# Patient Record
Sex: Male | Born: 1940
Health system: Southern US, Community
[De-identification: ages and names within clinical notes are randomized; demographics above are authoritative.]

## PROBLEM LIST (undated history)

## (undated) DIAGNOSIS — K219 Gastro-esophageal reflux disease without esophagitis: Secondary | ICD-10-CM

## (undated) DIAGNOSIS — E7800 Pure hypercholesterolemia, unspecified: Secondary | ICD-10-CM

## (undated) DIAGNOSIS — J449 Chronic obstructive pulmonary disease, unspecified: Secondary | ICD-10-CM

## (undated) DIAGNOSIS — Z720 Tobacco use: Secondary | ICD-10-CM

## (undated) DIAGNOSIS — I10 Essential (primary) hypertension: Secondary | ICD-10-CM

## (undated) DIAGNOSIS — H3530 Unspecified macular degeneration: Secondary | ICD-10-CM

## (undated) DIAGNOSIS — D649 Anemia, unspecified: Secondary | ICD-10-CM

## (undated) DIAGNOSIS — I2510 Atherosclerotic heart disease of native coronary artery without angina pectoris: Secondary | ICD-10-CM

## (undated) DIAGNOSIS — S88919A Complete traumatic amputation of unspecified lower leg, level unspecified, initial encounter: Secondary | ICD-10-CM

## (undated) DIAGNOSIS — N400 Enlarged prostate without lower urinary tract symptoms: Secondary | ICD-10-CM

## (undated) DIAGNOSIS — C4491 Basal cell carcinoma of skin, unspecified: Secondary | ICD-10-CM

## (undated) DIAGNOSIS — I639 Cerebral infarction, unspecified: Secondary | ICD-10-CM

## (undated) HISTORY — DX: Atherosclerotic heart disease of native coronary artery without angina pectoris: I25.10

## (undated) HISTORY — DX: Anemia, unspecified: D64.9

## (undated) HISTORY — DX: Complete traumatic amputation of unspecified lower leg, level unspecified, initial encounter: S88.919A

## (undated) HISTORY — DX: Gastro-esophageal reflux disease without esophagitis: K21.9

## (undated) HISTORY — DX: Benign prostatic hyperplasia without lower urinary tract symptoms: N40.0

## (undated) HISTORY — DX: Essential (primary) hypertension: I10

## (undated) HISTORY — PX: APPENDECTOMY: SHX54

## (undated) HISTORY — DX: Chronic obstructive pulmonary disease, unspecified: J44.9

## (undated) HISTORY — DX: Unspecified macular degeneration: H35.30

## (undated) HISTORY — DX: Tobacco use: Z72.0

## (undated) HISTORY — PX: CARDIAC CATHETERIZATION: SHX172

## (undated) HISTORY — DX: Basal cell carcinoma of skin, unspecified: C44.91

## (undated) HISTORY — DX: Pure hypercholesterolemia, unspecified: E78.00

---

## 2006-07-21 HISTORY — PX: CORONARY ANGIOPLASTY WITH STENT PLACEMENT: SHX49

## 2006-09-28 HISTORY — PX: CORONARY ANGIOPLASTY WITH STENT PLACEMENT: SHX49

## 2011-08-26 DIAGNOSIS — S88919A Complete traumatic amputation of unspecified lower leg, level unspecified, initial encounter: Secondary | ICD-10-CM

## 2011-08-26 HISTORY — DX: Complete traumatic amputation of unspecified lower leg, level unspecified, initial encounter: S88.919A

## 2011-08-26 HISTORY — PX: AMPUTATION: SHX166

## 2011-09-05 ENCOUNTER — Ambulatory Visit: Payer: Self-pay | Admitting: Family Medicine

## 2011-09-05 LAB — CBC WITH DIFFERENTIAL/PLATELET
Basophil %: 0.7 %
Eosinophil #: 0.4 10*3/uL (ref 0.0–0.7)
Eosinophil %: 5.4 %
Lymphocyte #: 1.1 10*3/uL (ref 1.0–3.6)
Monocyte %: 6.8 %
Neutrophil #: 4.5 10*3/uL (ref 1.4–6.5)
Neutrophil %: 70.4 %
Platelet: 455 10*3/uL — ABNORMAL HIGH (ref 150–440)

## 2013-06-01 ENCOUNTER — Other Ambulatory Visit: Payer: Self-pay

## 2013-06-01 ENCOUNTER — Ambulatory Visit: Payer: Self-pay | Admitting: Cardiovascular Disease

## 2013-06-01 ENCOUNTER — Ambulatory Visit (INDEPENDENT_AMBULATORY_CARE_PROVIDER_SITE_OTHER): Payer: Commercial Managed Care - HMO | Admitting: Cardiovascular Disease

## 2013-06-01 ENCOUNTER — Encounter: Payer: Self-pay | Admitting: Cardiovascular Disease

## 2013-06-01 VITALS — BP 142/80 | HR 64 | Ht 69.0 in | Wt 154.5 lb

## 2013-06-01 DIAGNOSIS — Z955 Presence of coronary angioplasty implant and graft: Secondary | ICD-10-CM | POA: Insufficient documentation

## 2013-06-01 DIAGNOSIS — E1165 Type 2 diabetes mellitus with hyperglycemia: Secondary | ICD-10-CM

## 2013-06-01 DIAGNOSIS — R0602 Shortness of breath: Secondary | ICD-10-CM | POA: Diagnosis not present

## 2013-06-01 DIAGNOSIS — I2510 Atherosclerotic heart disease of native coronary artery without angina pectoris: Secondary | ICD-10-CM | POA: Diagnosis not present

## 2013-06-01 DIAGNOSIS — E119 Type 2 diabetes mellitus without complications: Secondary | ICD-10-CM

## 2013-06-01 DIAGNOSIS — I209 Angina pectoris, unspecified: Secondary | ICD-10-CM

## 2013-06-01 DIAGNOSIS — Z01818 Encounter for other preprocedural examination: Secondary | ICD-10-CM

## 2013-06-01 DIAGNOSIS — R079 Chest pain, unspecified: Secondary | ICD-10-CM

## 2013-06-01 DIAGNOSIS — S88919A Complete traumatic amputation of unspecified lower leg, level unspecified, initial encounter: Secondary | ICD-10-CM

## 2013-06-01 DIAGNOSIS — I25118 Atherosclerotic heart disease of native coronary artery with other forms of angina pectoris: Secondary | ICD-10-CM | POA: Insufficient documentation

## 2013-06-01 DIAGNOSIS — E1151 Type 2 diabetes mellitus with diabetic peripheral angiopathy without gangrene: Secondary | ICD-10-CM | POA: Insufficient documentation

## 2013-06-01 DIAGNOSIS — IMO0002 Reserved for concepts with insufficient information to code with codable children: Secondary | ICD-10-CM | POA: Insufficient documentation

## 2013-06-01 DIAGNOSIS — Z9861 Coronary angioplasty status: Secondary | ICD-10-CM

## 2013-06-01 DIAGNOSIS — F17200 Nicotine dependence, unspecified, uncomplicated: Secondary | ICD-10-CM

## 2013-06-01 LAB — COMPREHENSIVE METABOLIC PANEL
ALBUMIN: 4 g/dL (ref 3.4–5.0)
ALK PHOS: 97 U/L
Anion Gap: 3 — ABNORMAL LOW (ref 7–16)
BUN: 13 mg/dL (ref 7–18)
Bilirubin,Total: 0.5 mg/dL (ref 0.2–1.0)
CALCIUM: 9.5 mg/dL (ref 8.5–10.1)
CREATININE: 1.26 mg/dL (ref 0.60–1.30)
Chloride: 100 mmol/L (ref 98–107)
Co2: 35 mmol/L — ABNORMAL HIGH (ref 21–32)
EGFR (Non-African Amer.): 57 — ABNORMAL LOW
Glucose: 207 mg/dL — ABNORMAL HIGH (ref 65–99)
Osmolality: 282 (ref 275–301)
POTASSIUM: 3.8 mmol/L (ref 3.5–5.1)
SGOT(AST): 23 U/L (ref 15–37)
SGPT (ALT): 17 U/L (ref 12–78)
Sodium: 138 mmol/L (ref 136–145)
Total Protein: 7.9 g/dL (ref 6.4–8.2)

## 2013-06-01 LAB — CBC WITH DIFFERENTIAL/PLATELET
Basophil #: 0 10*3/uL (ref 0.0–0.1)
Basophil %: 0.6 %
Eosinophil #: 0.3 10*3/uL (ref 0.0–0.7)
Eosinophil %: 3.9 %
HCT: 44.4 % (ref 40.0–52.0)
HGB: 15.2 g/dL (ref 13.0–18.0)
LYMPHS PCT: 18.5 %
Lymphocyte #: 1.4 10*3/uL (ref 1.0–3.6)
MCH: 31.4 pg (ref 26.0–34.0)
MCHC: 34.1 g/dL (ref 32.0–36.0)
MCV: 92 fL (ref 80–100)
Monocyte #: 0.6 x10 3/mm (ref 0.2–1.0)
Monocyte %: 7.7 %
NEUTROS PCT: 69.3 %
Neutrophil #: 5.4 10*3/uL (ref 1.4–6.5)
Platelet: 208 10*3/uL (ref 150–440)
RBC: 4.83 10*6/uL (ref 4.40–5.90)
RDW: 14 % (ref 11.5–14.5)
WBC: 7.8 10*3/uL (ref 3.8–10.6)

## 2013-06-01 LAB — LIPID PANEL
Cholesterol: 175 mg/dL (ref 0–200)
HDL Cholesterol: 39 mg/dL — ABNORMAL LOW (ref 40–60)
LDL CHOLESTEROL, CALC: 94 mg/dL (ref 0–100)
TRIGLYCERIDES: 211 mg/dL — AB (ref 0–200)
VLDL Cholesterol, Calc: 42 mg/dL — ABNORMAL HIGH (ref 5–40)

## 2013-06-01 LAB — PROTIME-INR
INR: 0.9
PROTHROMBIN TIME: 12.3 s (ref 11.5–14.7)

## 2013-06-01 MED ORDER — CLOPIDOGREL BISULFATE 75 MG PO TABS: 75.0000 mg | ORAL_TABLET | Freq: Every day | ORAL | Status: DC

## 2013-06-01 MED ORDER — ISOSORBIDE MONONITRATE ER 30 MG PO TB24: 30.0000 mg | ORAL_TABLET | Freq: Every day | ORAL | Status: DC

## 2013-06-01 NOTE — Assessment & Plan Note (Signed)
Prior stenting to his RCA, LAD and left circumflex in 2008

## 2013-06-01 NOTE — Patient Instructions (Addendum)
You are doing well. Please start isosorbide one a day for anginal pain Please start plavix 73m once daily  We will schedule you for a cardiac cath on Monday March 23rd  Please call [place] you have new issues that need to be addressed before your next appt.  Your physician wants you to follow-up in: 1 month.    [facility]   You are scheduled for a Cardiac Cath on:_____Monday, March 23____________  Please arrive at 9:30am on the day of your procedure  You will need to pre-register prior to the day of your procedure.  [facility]  Registration is the first desk on your right.  Please take the procedure order we have given you in order to be registered appropriately  Do not eat/drink anything after midnight  Someone will need to drive you home  It is recommended someone be with you for the first 24 hours after your procedure  Wear clothes that are easy to get on/off and wear slip on shoes if possible   Medications bring a current list of all medications with you  _X__ You may take all of your medications the morning of your procedure with enough water to swallow safely   Day of your procedure: [place].  Free valet service is available.  [place] at the registration desk (1st desk on your right) to receive your armband. After receiving your armband someone will escort you to the cardiac cath/special procedures waiting area.  The usual length of stay after your procedure is about 2 to 3 hours.  This can vary.  If you have any questions, please call our office at [phone] or you may [facility] at [phone]

## 2013-06-01 NOTE — Assessment & Plan Note (Signed)
Trauma from [place] 2 years ago

## 2013-06-01 NOTE — Assessment & Plan Note (Signed)
Notes indicate he has diet-controlled diabetes

## 2013-06-01 NOTE — Assessment & Plan Note (Signed)
We have encouraged him to continue to work on weaning his cigarettes and smoking cessation. He will continue to work on this and does not want any assistance with chantix.  

## 2013-06-01 NOTE — Progress Notes (Signed)
Patient ID: Stanley Mclean, male    DOB: [birth year], 73 y.o.   MRN: [id number]  HPI Comments: [name] is a pleasant 73 year old gentleman with a long smoking history, history of coronary artery disease, stent to his mid LAD, 2 stents to his proximal left circumflex in May 2008, stent to his proximal RCA July 2008, previous anginal symptoms with epigastric discomfort with exertion who presents by referral from Dr. Luan Pulling for symptoms of epigastric discomfort with exertion over the past 2 weeks or so.   He reports that symptoms are relatively similar to prior anginal symptoms before stent placement in 2008. He continues to smoke at least one pack per day. He has history of amputation on the right, walks only 100 feet or so before having to stop. Now cannot walk 50 feet before he gets symptoms. Initially he felt his symptoms were from heartburn and he is started on omeprazole. Ulcer receive nitroglycerin for symptoms. He has not had to take these yet. Burning in his abdomen up into his chest has presented during the daytime as well as nighttime  History of amputation from lawnmower accident 2 years ago He is on lyrica for neuropathy.  EKG shows normal sinus rhythm with rate 64 beats per minute with ST abnormality in the inferior leads consistent with inferior ischemia Prior EKG dated 05/31/2013 is the same as today   Outpatient Encounter Prescriptions as of 06/01/2013  Medication Sig  . albuterol (PROVENTIL HFA;VENTOLIN HFA) 108 (90 BASE) MCG/ACT inhaler Inhale 2 puffs into the lungs every 6 (six) hours as needed for wheezing or shortness of breath.  Marland Kitchen aspirin 81 MG tablet Take 81 mg by mouth daily.  . clopidogrel (PLAVIX) 75 MG tablet Take 1 tablet (75 mg total) by mouth daily.  Marland Kitchen ipratropium (ATROVENT) 0.03 % nasal spray Place 2 sprays into both nostrils every 12 (twelve) hours.  . isosorbide mononitrate (IMDUR) 30 MG 24 hr tablet Take 1 tablet (30 mg total) by mouth daily.  Marland Kitchen  lisinopril-hydrochlorothiazide (PRINZIDE,ZESTORETIC) 20-12.5 MG per tablet Take 1 tablet by mouth daily.  . metoprolol (LOPRESSOR) 100 MG tablet Take 100 mg by mouth 2 (two) times daily.  . nitroGLYCERIN (NITROSTAT) 0.4 MG SL tablet Place 0.4 mg under the tongue every 5 (five) minutes as needed for chest pain.  Marland Kitchen omeprazole (PRILOSEC) 20 MG capsule Take 20 mg by mouth daily.  . simvastatin (ZOCOR) 10 MG tablet Take 10 mg by mouth daily.  Marland Kitchen terazosin (HYTRIN) 1 MG capsule Take 1 mg by mouth once.    Review of Systems  Constitutional: Negative.   HENT: Negative.   Eyes: Negative.   Respiratory: Positive for chest tightness and shortness of breath.   Cardiovascular: Positive for chest pain.  Gastrointestinal: Negative.   Endocrine: Negative.   Musculoskeletal: Positive for gait problem.  Skin: Negative.   Allergic/Immunologic: Negative.   Neurological: Negative.   Hematological: Negative.   Psychiatric/Behavioral: Negative.   All other systems reviewed and are negative.    BP 142/80  Pulse 64  Ht _0  (1.753 m)  Wt 154 lb 8 oz (70.081 kg)  BMI 22.81 kg/m2  Physical Exam  Nursing note and vitals reviewed. Constitutional: He is oriented to person, place, and time. He appears well-developed and well-nourished.  HENT:  Head: Normocephalic.  Nose: Nose normal.  Mouth/Throat: Oropharynx is clear and moist.  Eyes: Conjunctivae are normal. Pupils are equal, round, and reactive to light.  Neck: Normal range of motion. Neck supple. No JVD present.  Cardiovascular: Normal rate, regular rhythm, S1 normal, S2 normal, normal heart sounds and intact distal pulses.  Exam reveals no gallop and no friction rub.   No murmur heard. Pulmonary/Chest: Effort normal and breath sounds normal. No respiratory distress. He has no wheezes. He has no rales. He exhibits no tenderness.  Abdominal: Soft. Bowel sounds are normal. He exhibits no distension. There is no tenderness.  Musculoskeletal: Normal  range of motion. He exhibits no edema and no tenderness.  Lymphadenopathy:    He has no cervical adenopathy.  Neurological: He is alert and oriented to person, place, and time. Coordination normal.  Skin: Skin is warm and dry. No rash noted. No erythema.  Psychiatric: He has a normal mood and affect. His behavior is normal. Judgment and thought content normal.      Assessment and Plan

## 2013-06-01 NOTE — Assessment & Plan Note (Addendum)
Current symptoms very concerning for worsening coronary artery disease. Symptoms similar to prior angina before stenting. After long discussion, we will schedule him for cardiac catheterization in the next several days. We have suggested he start isosorbide mononitrate 30 mg daily. Will also start Plavix 75 mg daily

## 2013-06-02 ENCOUNTER — Ambulatory Visit: Payer: Self-pay | Admitting: Family Medicine

## 2013-06-06 ENCOUNTER — Encounter: Payer: Self-pay | Admitting: Cardiovascular Disease

## 2013-06-06 ENCOUNTER — Ambulatory Visit: Payer: Self-pay | Admitting: Cardiovascular Disease

## 2013-06-06 HISTORY — PX: CORONARY ANGIOPLASTY WITH STENT PLACEMENT: SHX49

## 2013-06-06 LAB — CK TOTAL AND CKMB (NOT AT ARMC)
CK, Total: 68 U/L
CK-MB: 1.4 ng/mL (ref 0.5–3.6)

## 2013-06-06 LAB — HEMATOCRIT: HCT: 39.5 % — AB (ref 40.0–52.0)

## 2013-06-06 LAB — PLATELET COUNT: Platelet: 165 10*3/uL (ref 150–440)

## 2013-06-07 ENCOUNTER — Telehealth: Payer: Self-pay

## 2013-06-07 ENCOUNTER — Other Ambulatory Visit: Payer: Self-pay | Admitting: Physician Assistant

## 2013-06-07 ENCOUNTER — Encounter: Payer: Self-pay | Admitting: Physician Assistant

## 2013-06-07 DIAGNOSIS — I209 Angina pectoris, unspecified: Secondary | ICD-10-CM

## 2013-06-07 LAB — BASIC METABOLIC PANEL
Anion Gap: 7 (ref 7–16)
BUN: 13 mg/dL (ref 7–18)
CHLORIDE: 106 mmol/L (ref 98–107)
CO2: 28 mmol/L (ref 21–32)
Calcium, Total: 8.5 mg/dL (ref 8.5–10.1)
Creatinine: 1.06 mg/dL (ref 0.60–1.30)
EGFR (African American): >60
EGFR (Non-African Amer.): >60
Glucose: 97 mg/dL (ref 65–99)
Osmolality: 281 (ref 275–301)
Potassium: 3.1 mmol/L — ABNORMAL LOW (ref 3.5–5.1)
Sodium: 141 mmol/L (ref 136–145)

## 2013-06-07 MED ORDER — METOPROLOL TARTRATE 50 MG PO TABS: 50.0000 mg | ORAL_TABLET | Freq: Two times a day (BID) | ORAL | Status: DC

## 2013-06-07 MED ORDER — CLOPIDOGREL BISULFATE 75 MG PO TABS: 75.0000 mg | ORAL_TABLET | Freq: Every day | ORAL | Status: DC

## 2013-06-07 MED ORDER — ASPIRIN EC 325 MG PO TBEC: 325.0000 mg | DELAYED_RELEASE_TABLET | Freq: Every day | ORAL | Status: DC

## 2013-06-07 MED ORDER — LISINOPRIL 20 MG PO TABS: 20.0000 mg | ORAL_TABLET | Freq: Every day | ORAL | Status: DC

## 2013-06-07 MED ORDER — FAMOTIDINE 40 MG PO TABS: 40.0000 mg | ORAL_TABLET | Freq: Every day | ORAL | Status: DC

## 2013-06-07 NOTE — Telephone Encounter (Signed)
Patient contacted regarding discharge from [facility] on 06/07/13.  Patient understands to follow up with Dr. Rockey Situ on 06/22/13 at 11:30 at [facility]. Patient understands discharge instructions? yes Patient understands medications and regiment? yes Patient understands to bring all medications to this visit? yes

## 2013-06-08 ENCOUNTER — Other Ambulatory Visit: Payer: Self-pay

## 2013-06-08 DIAGNOSIS — R079 Chest pain, unspecified: Secondary | ICD-10-CM

## 2013-06-08 DIAGNOSIS — R0602 Shortness of breath: Secondary | ICD-10-CM

## 2013-06-08 DIAGNOSIS — I2510 Atherosclerotic heart disease of native coronary artery without angina pectoris: Secondary | ICD-10-CM

## 2013-06-08 DIAGNOSIS — Z01818 Encounter for other preprocedural examination: Secondary | ICD-10-CM

## 2013-06-22 ENCOUNTER — Encounter: Payer: Self-pay | Admitting: Cardiovascular Disease

## 2013-06-22 ENCOUNTER — Ambulatory Visit (INDEPENDENT_AMBULATORY_CARE_PROVIDER_SITE_OTHER): Payer: Commercial Managed Care - HMO | Admitting: Cardiovascular Disease

## 2013-06-22 VITALS — BP 102/62 | HR 72 | Ht 69.0 in | Wt 152.8 lb

## 2013-06-22 DIAGNOSIS — Z955 Presence of coronary angioplasty implant and graft: Secondary | ICD-10-CM

## 2013-06-22 DIAGNOSIS — I2510 Atherosclerotic heart disease of native coronary artery without angina pectoris: Secondary | ICD-10-CM

## 2013-06-22 DIAGNOSIS — I10 Essential (primary) hypertension: Secondary | ICD-10-CM | POA: Insufficient documentation

## 2013-06-22 DIAGNOSIS — Z9861 Coronary angioplasty status: Secondary | ICD-10-CM

## 2013-06-22 DIAGNOSIS — E119 Type 2 diabetes mellitus without complications: Secondary | ICD-10-CM

## 2013-06-22 DIAGNOSIS — F17200 Nicotine dependence, unspecified, uncomplicated: Secondary | ICD-10-CM

## 2013-06-22 MED ORDER — LISINOPRIL 40 MG PO TABS: 40.0000 mg | ORAL_TABLET | Freq: Every day | ORAL | Status: DC

## 2013-06-22 NOTE — Assessment & Plan Note (Signed)
Reason stent placed to his distal RCA with improvement of his anginal symptoms. Continue aspirin and Plavix. We'll stop the isosorbide given his headaches. Suggested he retry lisinopril 20 mg titrating up to 40 mg if tolerated. He'll monitor his blood pressure at home.

## 2013-06-22 NOTE — Assessment & Plan Note (Signed)
We have encouraged continued exercise, careful diet management in an effort to lose weight. 

## 2013-06-22 NOTE — Assessment & Plan Note (Signed)
DES placed to his distal RCA. Patent stent in the LAD and circumflex.  Recommended smoking cessation

## 2013-06-22 NOTE — Progress Notes (Signed)
Patient ID: Stanley Mclean, male    DOB: January 04, 1941, 73 y.o.   MRN: [id number]  HPI Comments: Stanley Mclean is a pleasant 73 year old gentleman with a long smoking history, history of coronary artery disease, stent to his mid LAD, 2 stents to his proximal left circumflex in May 2008, stent to his proximal RCA July 2008, previous anginal symptoms , patient of Dr. Luan Pulling recently presenting with  symptoms of epigastric discomfort with exertion .  He had cardiac catheterization 06/06/2013, found to have severe distal RCA disease, mild in-stent restenosis in the circumflex and LAD, normal ejection fraction. 40% mid LAD disease, 40% B1 disease, 30% proximal circumflex also noted. He had Xience stent 2.5 x 15 mm placed in the distal RCA. He was kept overnight and discharged in the morning.  Since his discharge, he denies having any further chest pain. Lisinopril was increased up to 40 mg daily by Dr. Luan Pulling. With this he started having headaches and decrease the dose back to 20 mg daily. He is continued on isosorbide, taking aspirin and Plavix. She needs to smoke  History of amputation from lawnmower accident 2 years ago He is on lyrica for neuropathy.  EKG shows normal sinus rhythm with rate 72 beats per minute with ST abnormality in the inferior leads consistent with inferior ischemia   Outpatient Encounter Prescriptions as of 06/22/2013  Medication Sig  . albuterol (PROVENTIL HFA;VENTOLIN HFA) 108 (90 BASE) MCG/ACT inhaler Inhale 2 puffs into the lungs every 6 (six) hours as needed for wheezing or shortness of breath.  Marland Kitchen aspirin EC 325 MG tablet Take 1 tablet (325 mg total) by mouth daily.  . clopidogrel (PLAVIX) 75 MG tablet Take 1 tablet (75 mg total) by mouth daily.  Marland Kitchen ipratropium (ATROVENT) 0.03 % nasal spray Place 2 sprays into both nostrils daily.   . metoprolol (LOPRESSOR) 50 MG tablet Take 1 tablet (50 mg total) by mouth 2 (two) times daily.  . nitroGLYCERIN (NITROSTAT) 0.4 MG SL tablet  Place 0.4 mg under the tongue every 5 (five) minutes as needed for chest pain.  . simvastatin (ZOCOR) 10 MG tablet Take 10 mg by mouth daily.  Marland Kitchen terazosin (HYTRIN) 1 MG capsule Take 1 mg by mouth once.  .  isosorbide mononitrate (IMDUR) 30 MG 24 hr tablet Take 1 tablet (30 mg total) by mouth daily.  Marland Kitchen  lisinopril (PRINIVIL,ZESTRIL) 20 MG tablet Take 1 tablet (20 mg total) by mouth daily.     Review of Systems  Constitutional: Negative.   HENT: Negative.   Eyes: Negative.   Gastrointestinal: Negative.   Endocrine: Negative.   Musculoskeletal: Positive for gait problem.  Skin: Negative.   Allergic/Immunologic: Negative.   Neurological: Negative.   Hematological: Negative.   Psychiatric/Behavioral: Negative.   All other systems reviewed and are negative.   BP 102/62  Pulse 72  Ht _0  (1.753 m)  Wt 152 lb 12 oz (69.287 kg)  BMI 22.55 kg/m2  Physical Exam  Nursing note and vitals reviewed. Constitutional: He is oriented to person, place, and time. He appears well-developed and well-nourished.  HENT:  Head: Normocephalic.  Nose: Nose normal.  Mouth/Throat: Oropharynx is clear and moist.  Eyes: Conjunctivae are normal. Pupils are equal, round, and reactive to light.  Neck: Normal range of motion. Neck supple. No JVD present.  Cardiovascular: Normal rate, regular rhythm, S1 normal, S2 normal, normal heart sounds and intact distal pulses.  Exam reveals no gallop and no friction rub.   No murmur heard.  Pulmonary/Chest: Effort normal and breath sounds normal. No respiratory distress. He has no wheezes. He has no rales. He exhibits no tenderness.  Abdominal: Soft. Bowel sounds are normal. He exhibits no distension. There is no tenderness.  Musculoskeletal: Normal range of motion. He exhibits no edema and no tenderness.  Lymphadenopathy:    He has no cervical adenopathy.  Neurological: He is alert and oriented to person, place, and time. Coordination normal.  Skin: Skin is warm and  dry. No rash noted. No erythema.  Psychiatric: He has a normal mood and affect. His behavior is normal. Judgment and thought content normal.      Assessment and Plan

## 2013-06-22 NOTE — Assessment & Plan Note (Signed)
We have encouraged him to continue to work on weaning his cigarettes and smoking cessation. He will continue to work on this and does not want any assistance with chantix.  

## 2013-06-22 NOTE — Assessment & Plan Note (Signed)
Recent headaches. We will stop the isosorbide. Blood pressure is low on today's visit, 591 systolic or lower on my check and per nursing. He will monitor his pressure on lisinopril 20 mg daily, consider increasing back to 40 mg daily if tolerated

## 2013-06-22 NOTE — Patient Instructions (Signed)
You are doing well. Please stop the imdur/isosorbide Continue lisinopril 20 mg daily Lisinopril 40 mg if needed for blood pressure (keep top number <135)  Please call us if you have new issues that need to be addressed before your next appt.  Your physician wants you to follow-up in: 6 months.  You will receive a reminder letter in the mail two months in advance. If you don't receive a letter, please call our office to schedule the follow-up appointment.

## 2013-07-06 ENCOUNTER — Ambulatory Visit: Payer: Medicare PPO | Admitting: Cardiovascular Disease

## 2013-12-19 ENCOUNTER — Encounter: Payer: Self-pay | Admitting: Cardiovascular Disease

## 2013-12-19 ENCOUNTER — Ambulatory Visit (INDEPENDENT_AMBULATORY_CARE_PROVIDER_SITE_OTHER): Payer: Commercial Managed Care - HMO | Admitting: Cardiovascular Disease

## 2013-12-19 VITALS — BP 120/82 | HR 68 | Ht 68.0 in | Wt 151.0 lb

## 2013-12-19 DIAGNOSIS — E785 Hyperlipidemia, unspecified: Secondary | ICD-10-CM

## 2013-12-19 DIAGNOSIS — Z720 Tobacco use: Secondary | ICD-10-CM

## 2013-12-19 DIAGNOSIS — F17200 Nicotine dependence, unspecified, uncomplicated: Secondary | ICD-10-CM

## 2013-12-19 DIAGNOSIS — R0602 Shortness of breath: Secondary | ICD-10-CM

## 2013-12-19 DIAGNOSIS — I25119 Atherosclerotic heart disease of native coronary artery with unspecified angina pectoris: Secondary | ICD-10-CM

## 2013-12-19 DIAGNOSIS — E119 Type 2 diabetes mellitus without complications: Secondary | ICD-10-CM

## 2013-12-19 DIAGNOSIS — Z955 Presence of coronary angioplasty implant and graft: Secondary | ICD-10-CM

## 2013-12-19 DIAGNOSIS — I2510 Atherosclerotic heart disease of native coronary artery without angina pectoris: Secondary | ICD-10-CM

## 2013-12-19 DIAGNOSIS — I10 Essential (primary) hypertension: Secondary | ICD-10-CM

## 2013-12-19 NOTE — Assessment & Plan Note (Signed)
Diabetes well-controlled. Hemoglobin A1c 6.3

## 2013-12-19 NOTE — Assessment & Plan Note (Signed)
He reports that he is taking metoprolol 100 mg twice a day. He does report fatigue. Blood pressure is well controlled today with systolic pressure 195, heart rate in the high 60s. We left it up to him and if his fatigue is significant, he could try metoprolol 50 mg twice a day

## 2013-12-19 NOTE — Progress Notes (Signed)
Patient ID: Stanley Mclean, male    DOB: 12-Jul-1940, 73 y.o.   MRN: [id number]  HPI Comments: Stanley Mclean is a pleasant 73 year old gentleman with a long smoking history who continues to smoke one pack per day, history of coronary artery disease, stent to his mid LAD, 2 stents to his proximal left circumflex in May 2008, stent to his proximal RCA July 2008, previous anginal symptoms , patient of Dr. Luan Pulling, who presents for routine followup  He had cardiac catheterization 06/06/2013, found to have severe distal RCA disease, mild in-stent restenosis in the circumflex and LAD, normal ejection fraction. 40% mid LAD disease, 40% B1 disease, 30% proximal circumflex also noted. He had Xience stent 2.5 x 15 mm placed in the distal RCA.   In followup today, he reports that he has been feeling well. He currently takes metoprolol 100 mg twice a day. Previously was on 50 mg twice a day Overall he feels well though does report having fatigue. Hemoglobin A1c 6.3 He does not do any regular exercise. Currently on inhalers. Continues to smoke one pack per day Denies any chest pain. He does have chronic mild shortness of breath with exertion  History of amputation of the right lower extremity from lawnmower accident 2 years ago History of neuropathy.  EKG shows normal sinus rhythm with rate 68 beats per minute with nonspecific ST abnormality   Outpatient Encounter Prescriptions as of 12/19/2013  Medication Sig  . albuterol (PROVENTIL HFA;VENTOLIN HFA) 108 (90 BASE) MCG/ACT inhaler Inhale 2 puffs into the lungs every 6 (six) hours as needed for wheezing or shortness of breath.  Marland Kitchen aspirin 81 MG tablet Take 81 mg by mouth daily.  . budesonide-formoterol (SYMBICORT) 160-4.5 MCG/ACT inhaler Inhale 2 puffs into the lungs 2 (two) times daily.  . clopidogrel (PLAVIX) 75 MG tablet Take 1 tablet (75 mg total) by mouth daily.  Marland Kitchen ipratropium (ATROVENT) 0.03 % nasal spray Place 2 sprays into both nostrils daily.   Marland Kitchen  lisinopril (PRINIVIL,ZESTRIL) 40 MG tablet Take 1 tablet (40 mg total) by mouth daily.  . metoprolol (LOPRESSOR) 50 MG tablet Take 100 mg by mouth 2 (two) times daily.  . nitroGLYCERIN (NITROSTAT) 0.4 MG SL tablet Place 0.4 mg under the tongue every 5 (five) minutes as needed for chest pain.  . simvastatin (ZOCOR) 10 MG tablet Take 10 mg by mouth daily.  Marland Kitchen terazosin (HYTRIN) 1 MG capsule Take 1 mg by mouth once.    Review of Systems  Constitutional: Negative.   HENT: Negative.   Eyes: Negative.   Respiratory: Negative.   Cardiovascular: Negative.   Gastrointestinal: Negative.   Endocrine: Negative.   Musculoskeletal: Positive for gait problem.  Skin: Negative.   Allergic/Immunologic: Negative.   Neurological: Negative.   Hematological: Negative.   Psychiatric/Behavioral: Negative.   All other systems reviewed and are negative.   BP 120/82  Pulse 68  Ht _0  (1.727 m)  Wt 151 lb (68.493 kg)  BMI 22.96 kg/m2  Physical Exam  Nursing note and vitals reviewed. Constitutional: He is oriented to person, place, and time. He appears well-developed and well-nourished.  HENT:  Head: Normocephalic.  Nose: Nose normal.  Mouth/Throat: Oropharynx is clear and moist.  Eyes: Conjunctivae are normal. Pupils are equal, round, and reactive to light.  Neck: Normal range of motion. Neck supple. No JVD present.  Cardiovascular: Normal rate, regular rhythm, S1 normal, S2 normal, normal heart sounds and intact distal pulses.  Exam reveals no gallop and no friction rub.  No murmur heard. Pulmonary/Chest: Effort normal and breath sounds normal. No respiratory distress. He has no wheezes. He has no rales. He exhibits no tenderness.  Abdominal: Soft. Bowel sounds are normal. He exhibits no distension. There is no tenderness.  Musculoskeletal: Normal range of motion. He exhibits no edema and no tenderness.  Lymphadenopathy:    He has no cervical adenopathy.  Neurological: He is alert and oriented  to person, place, and time. Coordination normal.  Skin: Skin is warm and dry. No rash noted. No erythema.  Psychiatric: He has a normal mood and affect. His behavior is normal. Judgment and thought content normal.      Assessment and Plan

## 2013-12-19 NOTE — Assessment & Plan Note (Signed)
Currently with no symptoms of angina. No further workup at this time. Continue current medication regimen. 

## 2013-12-19 NOTE — Assessment & Plan Note (Signed)
Chronic, stable shortness of breath. Likely from underlying COPD

## 2013-12-19 NOTE — Assessment & Plan Note (Signed)
We have discussed his smoking with him. We have encouraged him to continue to work on weaning his cigarettes and smoking cessation. He will continue to work on this and does not want any assistance with chantix.

## 2013-12-19 NOTE — Patient Instructions (Addendum)
You are doing well. No medication changes were made.  Ok to drop the metoprolol down to 50 mg twice a day if you have fatigue  Please call us if you have new issues that need to be addressed before your next appt.  Your physician wants you to follow-up in: 6 months.  You will receive a reminder letter in the mail two months in advance. If you don't receive a letter, please call our office to schedule the follow-up appointment.

## 2013-12-19 NOTE — Assessment & Plan Note (Signed)
No recent anginal symptoms. Recommended that he stay on his aspirin and Plavix

## 2013-12-29 ENCOUNTER — Other Ambulatory Visit (INDEPENDENT_AMBULATORY_CARE_PROVIDER_SITE_OTHER): Payer: Commercial Managed Care - HMO

## 2013-12-29 DIAGNOSIS — E785 Hyperlipidemia, unspecified: Secondary | ICD-10-CM

## 2013-12-29 DIAGNOSIS — I25119 Atherosclerotic heart disease of native coronary artery with unspecified angina pectoris: Secondary | ICD-10-CM

## 2013-12-30 LAB — LIPID PANEL
Chol/HDL Ratio: 3.9 ratio units (ref 0.0–5.0)
Cholesterol, Total: 166 mg/dL (ref 100–199)
HDL: 43 mg/dL (ref >39)
LDL Calculated: 69 mg/dL (ref 0–99)
Triglycerides: 270 mg/dL — ABNORMAL HIGH (ref 0–149)
VLDL CHOLESTEROL CAL: 54 mg/dL — AB (ref 5–40)

## 2013-12-30 LAB — HEPATIC FUNCTION PANEL
ALK PHOS: 108 IU/L (ref 39–117)
ALT: 16 IU/L (ref 0–44)
AST: 22 IU/L (ref 0–40)
Albumin: 4.7 g/dL (ref 3.5–4.8)
BILIRUBIN DIRECT: 0.08 mg/dL (ref 0.00–0.40)
TOTAL PROTEIN: 7.1 g/dL (ref 6.0–8.5)
Total Bilirubin: 0.3 mg/dL (ref 0.0–1.2)

## 2014-01-03 ENCOUNTER — Other Ambulatory Visit: Payer: Self-pay

## 2014-01-03 MED ORDER — SIMVASTATIN 20 MG PO TABS: 20.0000 mg | ORAL_TABLET | Freq: Every day | ORAL | Status: DC

## 2014-01-16 ENCOUNTER — Other Ambulatory Visit: Payer: Self-pay | Admitting: *Deleted

## 2014-01-16 MED ORDER — SIMVASTATIN 20 MG PO TABS: 20.0000 mg | ORAL_TABLET | Freq: Every day | ORAL | Status: DC

## 2014-01-16 NOTE — Telephone Encounter (Signed)
Patient needs a 90 day supply

## 2014-06-19 ENCOUNTER — Encounter (INDEPENDENT_AMBULATORY_CARE_PROVIDER_SITE_OTHER): Payer: Self-pay

## 2014-06-19 ENCOUNTER — Ambulatory Visit (INDEPENDENT_AMBULATORY_CARE_PROVIDER_SITE_OTHER): Payer: PPO | Admitting: Cardiovascular Disease

## 2014-06-19 ENCOUNTER — Encounter: Payer: Self-pay | Admitting: Cardiovascular Disease

## 2014-06-19 VITALS — BP 148/80 | HR 71 | Ht 69.0 in | Wt 149.5 lb

## 2014-06-19 DIAGNOSIS — Z720 Tobacco use: Secondary | ICD-10-CM

## 2014-06-19 DIAGNOSIS — R230 Cyanosis: Secondary | ICD-10-CM

## 2014-06-19 DIAGNOSIS — R208 Other disturbances of skin sensation: Secondary | ICD-10-CM

## 2014-06-19 DIAGNOSIS — R079 Chest pain, unspecified: Secondary | ICD-10-CM | POA: Diagnosis not present

## 2014-06-19 DIAGNOSIS — E119 Type 2 diabetes mellitus without complications: Secondary | ICD-10-CM

## 2014-06-19 DIAGNOSIS — R209 Unspecified disturbances of skin sensation: Secondary | ICD-10-CM | POA: Insufficient documentation

## 2014-06-19 DIAGNOSIS — Z955 Presence of coronary angioplasty implant and graft: Secondary | ICD-10-CM

## 2014-06-19 DIAGNOSIS — F17200 Nicotine dependence, unspecified, uncomplicated: Secondary | ICD-10-CM

## 2014-06-19 DIAGNOSIS — R231 Pallor: Secondary | ICD-10-CM | POA: Diagnosis not present

## 2014-06-19 DIAGNOSIS — I25119 Atherosclerotic heart disease of native coronary artery with unspecified angina pectoris: Secondary | ICD-10-CM

## 2014-06-19 DIAGNOSIS — I739 Peripheral vascular disease, unspecified: Secondary | ICD-10-CM | POA: Diagnosis not present

## 2014-06-19 DIAGNOSIS — I25111 Atherosclerotic heart disease of native coronary artery with angina pectoris with documented spasm: Secondary | ICD-10-CM

## 2014-06-19 DIAGNOSIS — I10 Essential (primary) hypertension: Secondary | ICD-10-CM

## 2014-06-19 MED ORDER — METOPROLOL TARTRATE 100 MG PO TABS: 100.0000 mg | ORAL_TABLET | Freq: Two times a day (BID) | ORAL | Status: DC

## 2014-06-19 NOTE — Assessment & Plan Note (Signed)
Recheck of his blood pressure down from 269 systolic to 485I. Recommended he monitor his blood pressure at home and call our office if this continues to run high

## 2014-06-19 NOTE — Assessment & Plan Note (Signed)
Episode of chest pain 1-2 months ago. We have discussed this with him in detail. He does not want a stress test or catheterization at this time. Recommended he call us if he has additional episodes

## 2014-06-19 NOTE — Progress Notes (Signed)
Patient ID: Stanley Mclean, male    DOB: 17-Sep-1940, 74 y.o.   MRN: [id number]  HPI Comments: Stanley Mclean is a pleasant 74 year old gentleman with a long smoking history who continues to smoke one pack per day, history of coronary artery disease, stent to his mid LAD, 2 stents to his proximal left circumflex in May 2008, stent to his proximal RCA July 2008, previous anginal symptoms , patient of Stanley Mclean, who presents for routine followup of his coronary artery disease History of amputation of the right lower extremity from lawnmower accident 2 years ago  In follow-up today, he reports having an episode of chest pain 1-2 months ago. He was exerting himself doing some jobs outside the house. He reports having a relatively high level of exertion. Chest pain presented acutely. He took several nitroglycerin with no relief. He does not remember whether he took some albuterol. Symptoms seem to resolve without intervention. Rather than stopping, he continued to exert himself. No further symptoms No further episodes of chest pain since that time  His other concern is he feels his right leg stump low the knee is very cold and discolored. He is concerned about circulation. He does not remember this being cold in the past. He continues to smoke more than one pack per day  He also reports having very low blood pressure December 2015. He stopped his medications. Blood pressure stayed low for approximately one month before climbing again. Currently taking his medications Blood pressure elevated on today's visit Tolerating metoprolol 100 mg twice a day He has chronic mild shortness of breath  EKG from today's visit shows normal sinus rhythm with rate 71 bpm, nonspecific ST abnormality  Other past medical history  cardiac catheterization 06/06/2013, found to have severe distal RCA disease, mild in-stent restenosis in the circumflex and LAD, normal ejection fraction. 40% mid LAD disease, 40% B1 disease, 30%  proximal circumflex also noted. He had Xience stent 2.5 x 15 mm placed in the distal RCA.   Previous lab work, Hemoglobin A1c 6.3  History of neuropathy.   No Known Allergies  Outpatient Encounter Prescriptions as of 06/19/2014  Medication Sig  . albuterol (PROVENTIL HFA;VENTOLIN HFA) 108 (90 BASE) MCG/ACT inhaler Inhale 2 puffs into the lungs every 6 (six) hours as needed for wheezing or shortness of breath.  Marland Kitchen aspirin 81 MG tablet Take 81 mg by mouth daily.  . budesonide-formoterol (SYMBICORT) 160-4.5 MCG/ACT inhaler Inhale 2 puffs into the lungs 2 (two) times daily.  . clopidogrel (PLAVIX) 75 MG tablet Take 1 tablet (75 mg total) by mouth daily.  Marland Kitchen ipratropium (ATROVENT) 0.03 % nasal spray Place 2 sprays into both nostrils daily.   Marland Kitchen lisinopril (PRINIVIL,ZESTRIL) 40 MG tablet Take 1 tablet (40 mg total) by mouth daily.  . metoprolol (LOPRESSOR) 100 MG tablet Take 1 tablet (100 mg total) by mouth 2 (two) times daily.  . nitroGLYCERIN (NITROSTAT) 0.4 MG SL tablet Place 0.4 mg under the tongue every 5 (five) minutes as needed for chest pain.  . simvastatin (ZOCOR) 20 MG tablet Take 1 tablet (20 mg total) by mouth daily.  Marland Kitchen terazosin (HYTRIN) 1 MG capsule Take 1 mg by mouth once.  . [DISCONTINUED] metoprolol (LOPRESSOR) 50 MG tablet Take 100 mg by mouth 2 (two) times daily.    Past Medical History  Diagnosis Date  . Type II or unspecified type diabetes mellitus without mention of complication, not stated as uncontrolled   . Unspecified essential hypertension   . Pure  hypercholesterolemia   . Traumatic amputation of leg(s) (complete) (partial), unilateral, level not specified, without mention of complication   . GERD (gastroesophageal reflux disease)   . CAD (coronary artery disease)     a. s/p multiple prior PCIs b. s/p DES-distal RCA 05/2013  . Tobacco abuse   . Macular degeneration   . COPD (chronic obstructive pulmonary disease)   . Anemia   . BPH (benign prostatic hyperplasia)      Past Surgical History  Procedure Laterality Date  . Amputation  08/26/2011  . Appendectomy    . Cardiac catheterization    . Coronary angioplasty with stent placement  09/28/2006    RCA  . Coronary angioplasty with stent placement  07/21/2006    Mid LAD  . Coronary angioplasty with stent placement  06/06/2013    30% prox LAD at the site of a prior stent, 40% mid LAD at the origin of D1, 40% ostial D1, 30% prox LCx at the site of a prior stent, in the proximal third of the vessel segment, 20% prox RCA, 99% distal RCA s/p DES. EF >55%.    Social History  reports that he has been smoking Cigarettes.  He has a 50 pack-year smoking history. He does not have any smokeless tobacco history on file. He reports that he does not drink alcohol or use illicit drugs.  Family History Family history is unknown by patient.  Review of Systems  Constitutional: Negative.   Respiratory: Negative.   Cardiovascular: Negative.   Gastrointestinal: Negative.   Musculoskeletal: Positive for gait problem.       Cold leg on the right below the knee, site of amputation  Skin: Negative.   Neurological: Negative.   Hematological: Negative.   Psychiatric/Behavioral: Negative.   All other systems reviewed and are negative.   BP 148/80 mmHg  Pulse 71  Ht 5' 9" (1.753 m)  Wt 149 lb 8 oz (67.813 kg)  BMI 22.07 kg/m2  Physical Exam  Constitutional: He is oriented to person, place, and time. He appears well-developed and well-nourished.  HENT:  Head: Normocephalic.  Nose: Nose normal.  Mouth/Throat: Oropharynx is clear and moist.  Eyes: Conjunctivae are normal. Pupils are equal, round, and reactive to light.  Neck: Normal range of motion. Neck supple. No JVD present.  Cardiovascular: Normal rate, regular rhythm, S1 normal, S2 normal, normal heart sounds and intact distal pulses.  Exam reveals no gallop and no friction rub.   No murmur heard. Pulmonary/Chest: Effort normal and breath sounds normal. No  respiratory distress. He has no wheezes. He has no rales. He exhibits no tenderness.  Abdominal: Soft. Bowel sounds are normal. He exhibits no distension. There is no tenderness.  Musculoskeletal: Normal range of motion. He exhibits no edema or tenderness.   Right lower extremity amputation below the knee  Lymphadenopathy:    He has no cervical adenopathy.  Neurological: He is alert and oriented to person, place, and time. Coordination normal.  Skin: Skin is warm and dry. No rash noted. No erythema.  Psychiatric: He has a normal mood and affect. His behavior is normal. Judgment and thought content normal.      Assessment and Plan   Nursing note and vitals reviewed.

## 2014-06-19 NOTE — Assessment & Plan Note (Signed)
Right lower extremity has below-the-knee amputation. Stump is cold, mildly discolored. Patient is more aware of this and concerned about circulation issues. We have ordered lower extremity Doppler, arterial

## 2014-06-19 NOTE — Assessment & Plan Note (Signed)
Recommended that he stay on his aspirin and Plavix High risk of worsening CAD and PAD

## 2014-06-19 NOTE — Assessment & Plan Note (Signed)
We have encouraged him to continue to work on weaning his cigarettes and smoking cessation. He will continue to work on this and does not want any assistance with chantix.

## 2014-06-19 NOTE — Patient Instructions (Addendum)
You are doing well. No medication changes were made.  We will schedule an u/s of your legs for claudication, discoloration and coolness of your skin  We will order a carotid ultrasound on your next visit  Please monitor your blood pressure at home  Please call us if you have new issues that need to be addressed before your next appt.  Your physician wants you to follow-up in: 6 months.  You will receive a reminder letter in the mail two months in advance. If you don't receive a letter, please call our office to schedule the follow-up appointment.

## 2014-06-19 NOTE — Assessment & Plan Note (Signed)
We have encouraged continued exercise, careful diet management

## 2014-06-19 NOTE — Assessment & Plan Note (Signed)
Recent episode of chest pain 1-2 months ago. Unable to exclude angina though there was no relief with nitroglycerin. Recommended he call our office if he has additional episodes. Also recommended he try his inhaler, albuterol

## 2014-07-08 NOTE — Discharge Summary (Signed)
Dates of Admission and Diagnosis:  Date of Admission 06-Jun-2013   Date of Discharge 07-Jun-2013   Admitting Diagnosis Angina, CCS class III   Final Diagnosis Angina, CCS class III   Discharge Diagnosis 1 CAD, native coronary artery   2 Type 2 diabetes mellitus   3 Tobacco abuse   4 Hypertension   5 Hyperlipidemia   6 GERD   7 S/p cardiac catheterization, percutaneous coronary angioplasty with stent placement   8 Hypokalemia   9 Sinus bradycardia    Chief Complaint/History of Present Illness [name] is a pleasant 74 year old gentleman with a long smoking history, h/o CAD (s/p stent to his mid LAD, 2 stents to his proximal left circumflex in May 2008, stent to his proximal RCA July 2008), HTN, HLD, DM2, GERD and traumatic RLE leg amputation from lawnmower accident who was observed at [facility] overnight s/p PCI.  He was referred to Dr. Rockey Situ by his PCP Dr. Luan Pulling earlier this month for exertional epigastric discomfort similar to his prior angina. He initially related this to reflux symptoms, and started taking Prilosec with minimal relief. He noticed the discomfort worsened after ~50 feet of walking requiring him to rest. He continues to smoke 1 PPD. He had received a prescription for NTG SL PRN, but had not yet used it.  His symptoms were concerning for CCS class III angina. Diagnostic cardiac catheterization was recommended. The details, risks andbenefits of the procedure were outlined to the patient who agreed to proceed.  He presented on 06/06/13 at [facility] for this procedure.   Allergies:  No Known Allergies:   Cardiac Catherization:  23-Mar-15 10:58   Cardiac Catheterization  --------------------Diagnostic Report-------------------- [facility] [address] [place], [place] [address] [phone]   Cardiovascular Catheterization Diagnostic Report   Patient: KREGG CIHLAR Study date: 06/06/2013 MR number: [id number] Account number: [id number]   DOB:  [birth year] Age: 74 years Gender: Male Race: White Height: 68.9 in Weight: 153.8 lb   Diagnostic Cardiologist:  Minna Merritts, MD   SUMMARY:   -IMPRESSIONS: -There is significant single vessel coronary artery disease.   -Summary: Severe distal RCA disease estimated at >95%. Diffuse mild lumenal irregularities. Mild ISR of the LCX and LAD stents. Normal EF estimated at >55% with no significant wall motion abnormality. No significant AS or MR.   Intervention needed on the distal RCA given unstable anginal sx despite medical management. Case discussed with Dr. Fletcher Anon.   CORONARY CIRCULATION: Proximal LAD: There was a diffuse 30 % stenosis at the site of a prior stent. Mid LAD: There was a discrete 40 % stenosis at the origin of D1. 1st diagonal: There was a discrete 40 % stenosis at the ostium of the vessel segment. Proximal circumflex: There was a 30 % stenosis at the site of a prior stent, in the proximal third ofthe vessel segment. Proximal RCA: There was a diffuse 20 % stenosis at the site of a prior stent. Distal RCA: There was a diffuse 99 % stenosis in the distal third of the vessel segment.   HISTORY: No history of previous myocardial infarction. There was no prior diagnosis of congestive heart failure. The patient has hypertension, oral hypoglycemic-treated diabetes, and a history of current cigarette use. There was no history of congestive heart failure, cardiogenic shock, cerebrovasculardisease, peripheral arterial disease, chronic lung disease, cardiomyopathy, or cardiac arrest. There was no family history of coronary artery disease. PRIOR CARDIOVASCULAR PROCEDURES: No history of valve surgery or coronary bypass surgery.   PRIOR DIAGNOSTIC  TEST RESULTS: No prior stress test is available.   PROCEDURE: The risks and alternatives of the procedures and conscious sedation were explained to the patient and informed consent was obtained. The patient was brought to  the cath lab and placed on the table. The planned puncture sites were prepped and draped in the usual sterile fashion.   -Right femoral artery access. The vessel was accessed, a wire was threaded into the vessel, and a was advanced over the wire into the vessel.   PROCEDURE COMPLETION: TIMING: Test started at 11:02. Test concluded at 11:38. MEDICATIONS GIVEN: Midazolam, 1 mg, IV, at 11:09. Fentanyl, 50 mcg, IV, at 11:13.   Prepared and signed by   Minna Merritts, MD Signed 06/06/2013 11:54:53   STUDY DIAGRAM   Angiographic findings Native coronary lesions:  Proximal LAD: Lesion 1: diffuse, 30 % stenosis, site of prior stent.  Mid LAD: Lesion 1: discrete, 40 % stenosis.  D1: Lesion 1: discrete, 40 % stenosis.  Proximal circumflex: Lesion 1: 30 % stenosis, site of prior stent.  Proximal RCA: Lesion 1: diffuse, 20 % stenosis, site of prior stent.  Distal RCA: Lesion 1: diffuse, 99 % stenosis.   HEMODYNAMIC TABLES - Diagnostic   Pressures:  Baseline Pressures:  - HR: 67 Pressures:  - Rhythm: Pressures:  -- Aortic Pressure (S/D/M): 122/54/82 Pressures:  -- Left Ventricle (s/edp): 123/19/--   Outputs:  Baseline Outputs:  -- CALCULATIONS: Age in years: 74 Outputs:  -- CALCULATIONS: Body Surface Area: 1.85 Outputs:  -- CALCULATIONS: Height in cm: 175.00 Outputs:  -- CALCULATIONS: Sex: Male Outputs:  -- CALCULATIONS: Weight in kg: 69.90   --------------------Interventional Report-------------------- [facility] [address], [place] [address] [phone]   Cardiovascular Catheterization Interventional Report   Patient: OKIE BOGACZ Study date: 06/06/2013 MR number: [id number] Account number: [id number]   DOB: Sep 19, 1940 Age: 74 years Gender: Male Race: White Height: 68.9 in Weight: 153.8 lb   Diagnostic Cardiologist:  Minna Merritts, MD Interventional Cardiologist:  Kathlyn [place], MD   SUMMARY:   -1ST LESION INTERVENTIONS: -A  drug-eluting stent was performed on the 99 % lesion in the distal RCA. Following intervention there was a 0 % residual stenosis.   -Summary: Severe distal RCA disease estimated at >95%. Diffuse mild lumenal irregularities. Mild ISR of the LCX and LAD stents. Normal EF estimated at >55% with no significant wall motion abnormality. No significant AS or MR.   Intervention needed on the distal RCA given unstable anginal sx despite medical management. Case discussed with Dr. Fletcher Anon.   Procedures performed: Intervention on distal RCA: drug-eluting stent.   HISTORY: No history of previous myocardial infarction. There was no prior diagnosis of congestive heart failure. The patient has hypertension, oral hypoglycemic-treated diabetes, and a history of current cigarette use. There was no history of congestive heart failure, cardiogenic shock, cerebrovascular disease, peripheral arterial disease, chronic lung disease, cardiomyopathy, or cardiac arrest. There was no family history of coronary artery disease. PRIOR CARDIOVASCULAR PROCEDURES: No history ofvalve surgery or coronary bypass surgery.   PRIOR DIAGNOSTIC TEST RESULTS: No prior stress test is available. COMPLICATIONS: No complication occurred during the cath lab visit.   PROCEDURE: The risks and alternatives of the procedures and conscious sedation were explained to the patient and informed consent was obtained. The patient was brought to the cath lab and placed on the table. The planned puncture sites were prepped and draped in the usual sterile fashion.   LESION INTERVENTION: A drug-eluting stent was performed  on the 99 % lesion in the distal RCA. Following intervention there was a 0 % residual stenosis. This was an ACC/AHA "non-high risk" lesion for intervention. There was TIMI 3 flow before the procedure and TIMI 3 flow after the procedure. There was no acute vessel closure. There was no perforation. There was no dissection.    -Vessel setup was performed. A 6 FR AL 0.75 guiding catheter was used to intubate the vessel.   -Vessel setup was performed. A Intuition steerable Guidewire wire was used to cross the lesion.   -Balloon angioplasty was performed, using a Rx Mini Trek 2.0 x 67m balloon, with 1 inflation(s) and a maximum inflation pressure of 10 atm.   -A Xience EX 2.5 x 15MM drug-elutingstent was placed across the lesion and deployed at a maximum inflation pressure of 18 atm.   -Balloon angioplasty was performed, using a Moreland Trek 3.0 x 181mballoon, with 1 inflation(s) and a maximum inflation pressure of 14 atm.   PROCEDURE COMPLETION: TIMING: Test started at 11:02. Test concluded at 12:36. RADIATION EXPOSURE: Fluoroscopy time: 8.97 min. Fluoroscopy dose: 1.133 Gray. MEDICATIONS GIVEN: Fentanyl, 25 mcg, IV, at 12:18. Midazolam, 0.5 mg, IV, at 12:18. Nitroglycerin, 200 mcg, intracoronary, at 12:17. Nitroglycerin, 200 mcg, intracoronary, at 12:27. Aspirin, 324 mg, PO, last dose at 12:10. Clopidogrel (Plavix), 300 mg, PO, last dose at 12:10. Angiomax Bolus, 7.5 ml, IV, at 12:06. Angiomax Drip, infusion rate of 24.5 ml/hr, IV, at 12:31. CONTRAST GIVEN: Isovue 150 ml.   Prepared and signed by   MuKathlyn SacramentoMD Signed 06/06/2013 12:46:12   STUDY DIAGRAM   Angiographic findings Native coronary lesions:  Proximal LAD: Lesion 1: diffuse, 30 % stenosis, site of prior stent.  Mid LAD: Lesion 1: discrete, 40 % stenosis.  D1: Lesion 1: discrete, 40 % stenosis.  Proximal circumflex: Lesion 1: 30 % stenosis, site of prior stent.  Proximal RCA: Lesion 1: diffuse, 20 % stenosis, site of prior stent. Distal RCA: Lesion 1: diffuse, 99 % stenosis. Intervention results Native coronary lesions: drug-eluting stent of the 99 % stenosis in distal RCA. 0 % residual stenosis. Stent: Xience EX 2.5 x 15MM drug-eluting.  Routine Chem:  24-Mar-15 04:59   Glucose, Serum 97  BUN 13  Creatinine (comp) 1.06   Sodium, Serum 141  Potassium, Serum  3.1  Chloride, Serum 106  CO2, Serum 28  Calcium (Total), Serum 8.5  Anion Gap 7  Osmolality (calc) 281  eGFR (African American) >60  eGFR (Non-African American) >60 (eGFR values <6017min/1.73 m2 may be an indication of chronic kidney disease (CKD). Calculated eGFR is useful in patients with stable renal function. The eGFR calculation will not be reliable in acutely ill patients when serum creatinine is changing rapidly. It is not useful in  patients on dialysis. The eGFR calculation may not be applicable to patients at the low and high extremes of body sizes, pregnant women, and vegetarians.)  Cardiac:  23-Mar-15 20:47   CK, Total 68 (39-308 NOTE: NEW REFERENCE RANGE  04/18/2013)  CPK-MB, Serum 1.4 (Result(s) reported on 06 Jun 2013 at 09:28PM.)   Pertinent Past History:  Pertinent Past History CAD, native coronary artery S/p multiple prior PCIs HTN HLD Ongoing tobacco abuse   Hospital Course:  Hospital Course He was informed, consented and prepped for the procedure. Access was obtained via the R femoral artery. For full details, please refer to the cath note. Notably, coronary angiography revealed 30% prox LAD at the site of a prior stent, 40% mid  LAD at the origin of D1, 40% ostial D1, 30% prox LCx at the site of a prior stent, in the proximal third of the vessel segment, 20% prox RCA, 99% distal RCA. EF >55%. The decision was made to perform PCI on the high-grade RCA lesion. Dr. Fletcher Anon performed a successful PCI w/ a DES to 99% distal RCA. The patient tolerated the procedure well and without complications. The recommendation was made to continue DAPT- ASA/Plavix- x 12 months.  He remained stable overnight with no acute events. He did have an episode of sinus bradycardia (HR 30-40s w/ non-conducted PACs) felt to be vagal-mediated and in the setting of high-dose metoprolol tartrate overnight while asleep. HR improved while awake. He was  evaluated by Dr. Rockey Situ this AM. Groin site was examined and determined to be healing well without complications. He will ambulate this AM with assistance and will be discharged thereafter provided there are no incidents with this.   He will be discharged on the medications outlined below. Lopressor will be down-titrated to 56m BID. Prilosec will be replaced with Pepcid given interaction w/ Plavix. HCTZ was discontinued. Lisinopril 260mdaily alone is to be continued. He will continue full-dose ASA x 2-3 months per MD preference. Medication changes and new prescriptions have been e-transmitted to the pharmacy on file.  Of note, the patient's potassium was 3.1 this AM. He will received KDur 40 mEq x 1. As above, HCTZ will be discontinued. He has been advised to increase potassium in diet at home.  Smoking cessation has been stressed.  Sleep study should be pursued as an outpatient given nocturnal bradycardia while asleep.   Condition on Discharge FaChamisalEDS:  Medication Reconciliation: Patient's Home Medications at Discharge:     Medication Instructions  albuterol cfc free 90 mcg/inh inhalation aerosol  2 puff(s) inhaled 4 times a day   clopidogrel 75 mg oral tablet  1 tab(s) orally once a day   ipratropium nasal 21 mcg/inh nasal spray  2 spray(s) nasal every 12 hours   isosorbide mononitrate 30 mg oral tablet, extended release  1 tab(s) orally once a day (in the morning)   nitroglycerin 0.4 mg sublingual tablet  1 tab(s) sublingual every 5 minutes, As Needed   simvastatin 10 mg oral tablet  1 tab(s) orally once a day (at bedtime)   terazosin 1 mg oral capsule  1 cap(s) orally once a day (at bedtime)   aspirin 325 mg oral delayed release tablet  1 tab(s) orally once a day   metoprolol tartrate 50 mg oral tablet  1 tab(s) orally 2 times a day   lisinopril 20 mg oral tablet  1 tab(s) orally once a day   pepcid 40 mg oral tablet  1 tab(s) orally once a day (at  bedtime)    STOP TAKING THE FOLLOWING MEDICATION(S):    hydrochlorothiazide-lisinopril 12.5 mg-20 mg oral tablet: 1 tab(s) orally once a day omeprazole 20 mg oral delayed release tablet: 1 tab(s) orally once a day  Physician's Instructions:  Diet heart healthy   Activity Limitations No lifting > 5 lbs x 1 week, no strenuous exercise or sexual activity x 2 weeks   Return to Work 1 week   Time frame for Follow Up Appointment 1-2 weeks   Other Comments To follow-up 06/22/2013 at 11:30 AM w/ Dr. GoRockey Situ Electronic Signatures: ArEverlean CherryRoLyda PeronePA-C)   (Signed 24-Mar-15 08:44)  Authored: ADMISSION DATE AND DIAGNOSIS, CHIEF COMPLAINT/HPI, PERTINENT LABS, HOSPITAL COURSE,  DISCHARGE INSTRUCTIONS HOME MEDS, PATIENT INSTRUCTIONS, Allergies, PERTINENT PAST HISTORY Ida Rogue (MD)   (Signed 24-Mar-15 10:16)  Co-Signer: ADMISSION DATE AND DIAGNOSIS, CHIEF COMPLAINT/HPI, PERTINENT LABS, HOSPITAL COURSE, DISCHARGE INSTRUCTIONS HOME MEDS, PATIENT INSTRUCTIONS, Allergies, PERTINENT PAST HISTORY  Last Updated: 24-Mar-15 10:16 by Ida Rogue (MD)

## 2014-07-10 ENCOUNTER — Encounter (INDEPENDENT_AMBULATORY_CARE_PROVIDER_SITE_OTHER): Payer: PPO

## 2014-07-10 ENCOUNTER — Other Ambulatory Visit: Payer: Self-pay | Admitting: Radiology

## 2014-07-10 DIAGNOSIS — R0989 Other specified symptoms and signs involving the circulatory and respiratory systems: Secondary | ICD-10-CM

## 2014-07-10 DIAGNOSIS — R238 Other skin changes: Secondary | ICD-10-CM

## 2014-07-10 DIAGNOSIS — R231 Pallor: Secondary | ICD-10-CM

## 2014-07-10 DIAGNOSIS — R269 Unspecified abnormalities of gait and mobility: Secondary | ICD-10-CM

## 2014-07-10 DIAGNOSIS — I25111 Atherosclerotic heart disease of native coronary artery with angina pectoris with documented spasm: Secondary | ICD-10-CM

## 2014-07-10 DIAGNOSIS — R208 Other disturbances of skin sensation: Secondary | ICD-10-CM | POA: Diagnosis not present

## 2014-07-10 DIAGNOSIS — I739 Peripheral vascular disease, unspecified: Secondary | ICD-10-CM

## 2014-07-10 DIAGNOSIS — R230 Cyanosis: Secondary | ICD-10-CM

## 2014-07-10 DIAGNOSIS — I25119 Atherosclerotic heart disease of native coronary artery with unspecified angina pectoris: Secondary | ICD-10-CM

## 2014-08-23 ENCOUNTER — Other Ambulatory Visit: Payer: Self-pay | Admitting: Cardiovascular Disease

## 2014-08-24 ENCOUNTER — Other Ambulatory Visit: Payer: Self-pay | Admitting: Family Medicine

## 2014-08-24 MED ORDER — CLOPIDOGREL BISULFATE 75 MG PO TABS: 75.0000 mg | ORAL_TABLET | Freq: Every day | ORAL | Status: DC

## 2014-09-11 ENCOUNTER — Other Ambulatory Visit: Payer: Self-pay | Admitting: Family Medicine

## 2014-09-11 MED ORDER — TERAZOSIN HCL 1 MG PO CAPS: 1.0000 mg | ORAL_CAPSULE | Freq: Once | ORAL | Status: DC

## 2014-09-11 NOTE — Telephone Encounter (Signed)
Spoke to pt who states he would like his Terazosin prescription to be changed to 90 day refills.  [place], [place]

## 2014-09-11 NOTE — Telephone Encounter (Signed)
Rx send for 90 days with no refill.

## 2014-11-02 ENCOUNTER — Telehealth: Payer: Self-pay | Admitting: Family Medicine

## 2014-11-02 ENCOUNTER — Other Ambulatory Visit: Payer: Self-pay | Admitting: Family Medicine

## 2014-11-02 MED ORDER — CLOPIDOGREL BISULFATE 75 MG PO TABS: 75.0000 mg | ORAL_TABLET | Freq: Every day | ORAL | Status: DC

## 2014-11-02 NOTE — Telephone Encounter (Signed)
Plavix has been changed to #90 with 1 refill. Pt has been notified.

## 2014-11-02 NOTE — Telephone Encounter (Signed)
Pt asked if he could have his plavix on 90 day refills instead of 30 day.  He uses [facility].  His call back number is [phone]

## 2014-12-11 ENCOUNTER — Telehealth: Payer: Self-pay | Admitting: Family Medicine

## 2014-12-11 NOTE — Telephone Encounter (Signed)
Pt called need refill on Terazosin  90 day supply pt.  Call

## 2014-12-12 ENCOUNTER — Telehealth: Payer: Self-pay | Admitting: Family Medicine

## 2014-12-12 NOTE — Telephone Encounter (Signed)
Pt called need a refill  Terazosin 90 days  Pt call back 3 is  [phone]

## 2014-12-13 ENCOUNTER — Other Ambulatory Visit: Payer: Self-pay

## 2014-12-13 MED ORDER — TERAZOSIN HCL 1 MG PO CAPS: 1.0000 mg | ORAL_CAPSULE | Freq: Once | ORAL | Status: DC

## 2014-12-13 NOTE — Telephone Encounter (Signed)
Ok-jh

## 2014-12-13 NOTE — Telephone Encounter (Signed)
Sent in 30 days and patient made appt for 01/05/2015.

## 2015-01-05 ENCOUNTER — Encounter: Payer: Self-pay | Admitting: Family Medicine

## 2015-01-05 ENCOUNTER — Ambulatory Visit (INDEPENDENT_AMBULATORY_CARE_PROVIDER_SITE_OTHER): Payer: PPO | Admitting: Family Medicine

## 2015-01-05 VITALS — BP 150/80 | HR 72 | Temp 98.2°F | Resp 16 | Ht 69.0 in | Wt 147.6 lb

## 2015-01-05 DIAGNOSIS — I10 Essential (primary) hypertension: Secondary | ICD-10-CM | POA: Diagnosis not present

## 2015-01-05 DIAGNOSIS — N400 Enlarged prostate without lower urinary tract symptoms: Secondary | ICD-10-CM

## 2015-01-05 DIAGNOSIS — E119 Type 2 diabetes mellitus without complications: Secondary | ICD-10-CM | POA: Diagnosis not present

## 2015-01-05 DIAGNOSIS — J4540 Moderate persistent asthma, uncomplicated: Secondary | ICD-10-CM

## 2015-01-05 DIAGNOSIS — J45909 Unspecified asthma, uncomplicated: Secondary | ICD-10-CM | POA: Insufficient documentation

## 2015-01-05 LAB — POCT GLYCOSYLATED HEMOGLOBIN (HGB A1C): Hemoglobin A1C: 6.6

## 2015-01-05 MED ORDER — TERAZOSIN HCL 1 MG PO CAPS: 1.0000 mg | ORAL_CAPSULE | Freq: Once | ORAL | Status: DC

## 2015-01-05 MED ORDER — AZITHROMYCIN 250 MG PO TABS: ORAL_TABLET | ORAL | Status: DC

## 2015-01-05 MED ORDER — PREDNISONE 10 MG PO TABS: ORAL_TABLET | ORAL | Status: DC

## 2015-01-05 NOTE — Patient Instructions (Addendum)
Discussed stopping cigs while sick.

## 2015-01-05 NOTE — Progress Notes (Signed)
Name: Stanley Mclean   MRN: [id number]    DOB: 11-29-1940   Date:01/05/2015       Progress Note  Subjective  Chief Complaint  Chief Complaint  Patient presents with  . Diabetes     OBTW pt has runny nose and chest congestion no chills or fever onset 6 days  . Hypertension    HPI Here for f/u of HBP and DM (diet controlled).  But he has bee sick for 6 days.  Got better then worse.past 3 days.  Cough is occ. Productive of clear sputum.  SOB .  Wheezing some.  No fever.  Only rarely checks BSs.  Usually 120s-140s.  BPs at home 120-130/80  No problem-specific assessment & plan notes found for this encounter.   Past Medical History  Diagnosis Date  . Type II or unspecified type diabetes mellitus without mention of complication, not stated as uncontrolled   . Unspecified essential hypertension   . Pure hypercholesterolemia   . Traumatic amputation of leg(s) (complete) (partial), unilateral, level not specified, without mention of complication   . GERD (gastroesophageal reflux disease)   . CAD (coronary artery disease)     a. s/p multiple prior PCIs b. s/p DES-distal RCA 05/2013  . Tobacco abuse   . Macular degeneration   . COPD (chronic obstructive pulmonary disease) ([place])   . Anemia   . BPH (benign prostatic hyperplasia)     Social History  Substance Use Topics  . Smoking status: Current Every Day Smoker -- 1.00 packs/day for 50 years    Types: Cigarettes  . Smokeless tobacco: Not on file  . Alcohol Use: No     Current outpatient prescriptions:  .  albuterol (PROVENTIL HFA;VENTOLIN HFA) 108 (90 BASE) MCG/ACT inhaler, Inhale 2 puffs into the lungs every 6 (six) hours as needed for wheezing or shortness of breath., Disp: , Rfl:  .  aspirin 81 MG tablet, Take 81 mg by mouth daily., Disp: , Rfl:  .  budesonide-formoterol (SYMBICORT) 160-4.5 MCG/ACT inhaler, Inhale 2 puffs into the lungs 2 (two) times daily., Disp: , Rfl:  .  clopidogrel (PLAVIX) 75 MG tablet, Take 1 tablet  (75 mg total) by mouth daily., Disp: 90 tablet, Rfl: 01 .  FLUZONE HIGH-DOSE 0.5 ML SUSY, TO BE ADMINISTERED BY PHARMACIST FOR IMMUNIZATION, Disp: , Rfl: 0 .  ipratropium (ATROVENT) 0.03 % nasal spray, Place 2 sprays into both nostrils daily. , Disp: , Rfl:  .  lisinopril (PRINIVIL,ZESTRIL) 40 MG tablet, TAKE ONE (1) TABLET EACH DAY, Disp: 90 tablet, Rfl: 3 .  metoprolol (LOPRESSOR) 100 MG tablet, Take 1 tablet (100 mg total) by mouth 2 (two) times daily., Disp: 180 tablet, Rfl: 3 .  nitroGLYCERIN (NITROSTAT) 0.4 MG SL tablet, Place 0.4 mg under the tongue every 5 (five) minutes as needed for chest pain., Disp: , Rfl:  .  simvastatin (ZOCOR) 20 MG tablet, Take 1 tablet (20 mg total) by mouth daily., Disp: 90 tablet, Rfl: 3 .  terazosin (HYTRIN) 1 MG capsule, Take 1 capsule (1 mg total) by mouth once., Disp: 30 capsule, Rfl: 0  No Known Allergies  Review of Systems  Constitutional: Positive for malaise/fatigue. Negative for fever and chills.  Eyes: Negative for blurred vision and double vision.  Respiratory: Positive for cough, shortness of breath and wheezing. Negative for sputum production.   Cardiovascular: Negative for chest pain, palpitations, orthopnea and leg swelling.  Gastrointestinal: Negative for heartburn, abdominal pain and blood in stool.  Genitourinary: Negative for dysuria,  urgency and frequency.  Neurological: Negative for dizziness.      Objective  Filed Vitals:   01/05/15 1540  BP: 178/95  Pulse: 72  Temp: 98.2 F (36.8 C)  TempSrc: Oral  Resp: 16  Height: 5' 9" (1.753 m)  Weight: 147 lb 9.6 oz (66.951 kg)     Physical Exam  Constitutional: He appears distressed (appears to feel ill.).  HENT:  Head: Normocephalic and atraumatic.  Right Ear: Hearing, tympanic membrane, external ear and ear canal normal.  Left Ear: Hearing, tympanic membrane, external ear and ear canal normal.  Nose: Mucosal edema and rhinorrhea present. Right sinus exhibits no maxillary  sinus tenderness and no frontal sinus tenderness. Left sinus exhibits no maxillary sinus tenderness and no frontal sinus tenderness.  Mouth/Throat: No oropharyngeal exudate, posterior oropharyngeal edema or posterior oropharyngeal erythema.  Neck: No thyromegaly present.  Pulmonary/Chest: Effort normal. No respiratory distress. He has rales (faint rales biulaterally.  Tight breath sounds throughout.).  Musculoskeletal: He exhibits no edema.  Lymphadenopathy:    He has no cervical adenopathy.  Vitals reviewed.     Recent Results (from the past 2160 hour(s))  POCT HgB A1C     Status: Normal   Collection Time: 01/05/15  4:09 PM  Result Value Ref Range   Hemoglobin A1C 6.6      Assessment & Plan  1. Type 2 diabetes mellitus without complication, unspecified long term insulin use status (HCC)  - POCT HgB A1C  2. Essential hypertension   3. Controlled type 2 diabetes mellitus without complication, without long-term current use of insulin ([place])   4. Asthmatic bronchitis, moderate persistent, uncomplicated  - azithromycin (ZITHROMAX) 250 MG tablet; Take 2 tablets on day 1, then 1 tablet daily on days 2-5.  Dispense: 6 tablet; Refill: 0 - predniSONE (DELTASONE) 10 MG tablet; Take 3 pills a day for 3 days, then 2 pills a day for 3 days, then 1 pill a day for 3 days  Dispense: 18 tablet; Refill: 0

## 2015-02-13 ENCOUNTER — Encounter: Payer: Self-pay | Admitting: Emergency Medicine

## 2015-02-13 ENCOUNTER — Emergency Department: Payer: PPO

## 2015-02-13 ENCOUNTER — Emergency Department
Admission: EM | Admit: 2015-02-13 | Discharge: 2015-02-13 | Disposition: A | Discharge status: Home / Self Care | Payer: PPO | Attending: Emergency Medicine | Admitting: Emergency Medicine

## 2015-02-13 DIAGNOSIS — S8992XA Unspecified injury of left lower leg, initial encounter: Secondary | ICD-10-CM | POA: Diagnosis present

## 2015-02-13 DIAGNOSIS — Z7902 Long term (current) use of antithrombotics/antiplatelets: Secondary | ICD-10-CM | POA: Diagnosis not present

## 2015-02-13 DIAGNOSIS — S8012XA Contusion of left lower leg, initial encounter: Secondary | ICD-10-CM | POA: Insufficient documentation

## 2015-02-13 DIAGNOSIS — Z7952 Long term (current) use of systemic steroids: Secondary | ICD-10-CM | POA: Insufficient documentation

## 2015-02-13 DIAGNOSIS — E119 Type 2 diabetes mellitus without complications: Secondary | ICD-10-CM | POA: Diagnosis not present

## 2015-02-13 DIAGNOSIS — Y998 Other external cause status: Secondary | ICD-10-CM | POA: Diagnosis not present

## 2015-02-13 DIAGNOSIS — Y9289 Other specified places as the place of occurrence of the external cause: Secondary | ICD-10-CM | POA: Insufficient documentation

## 2015-02-13 DIAGNOSIS — F17210 Nicotine dependence, cigarettes, uncomplicated: Secondary | ICD-10-CM | POA: Diagnosis not present

## 2015-02-13 DIAGNOSIS — Z7951 Long term (current) use of inhaled steroids: Secondary | ICD-10-CM | POA: Insufficient documentation

## 2015-02-13 DIAGNOSIS — Z792 Long term (current) use of antibiotics: Secondary | ICD-10-CM | POA: Insufficient documentation

## 2015-02-13 DIAGNOSIS — T148XXA Other injury of unspecified body region, initial encounter: Secondary | ICD-10-CM

## 2015-02-13 DIAGNOSIS — Z79899 Other long term (current) drug therapy: Secondary | ICD-10-CM | POA: Insufficient documentation

## 2015-02-13 DIAGNOSIS — W1839XA Other fall on same level, initial encounter: Secondary | ICD-10-CM | POA: Diagnosis not present

## 2015-02-13 DIAGNOSIS — I10 Essential (primary) hypertension: Secondary | ICD-10-CM | POA: Insufficient documentation

## 2015-02-13 DIAGNOSIS — Y9389 Activity, other specified: Secondary | ICD-10-CM | POA: Insufficient documentation

## 2015-02-13 DIAGNOSIS — Z7982 Long term (current) use of aspirin: Secondary | ICD-10-CM | POA: Insufficient documentation

## 2015-02-13 LAB — CBC WITH DIFFERENTIAL/PLATELET
BASOS PCT: 1 %
Basophils Absolute: 0 10*3/uL (ref 0–0.1)
EOS ABS: 0.2 10*3/uL (ref 0–0.7)
EOS PCT: 2 %
HCT: 42.2 % (ref 40.0–52.0)
Hemoglobin: 14.3 g/dL (ref 13.0–18.0)
LYMPHS ABS: 1.2 10*3/uL (ref 1.0–3.6)
Lymphocytes Relative: 15 %
MCH: 30.8 pg (ref 26.0–34.0)
MCHC: 33.9 g/dL (ref 32.0–36.0)
MCV: 91 fL (ref 80.0–100.0)
MONOS PCT: 6 %
Monocytes Absolute: 0.5 10*3/uL (ref 0.2–1.0)
Neutro Abs: 6 10*3/uL (ref 1.4–6.5)
Neutrophils Relative %: 76 %
PLATELETS: 250 10*3/uL (ref 150–440)
RBC: 4.63 MIL/uL (ref 4.40–5.90)
RDW: 13.2 % (ref 11.5–14.5)
WBC: 7.9 10*3/uL (ref 3.8–10.6)

## 2015-02-13 LAB — BASIC METABOLIC PANEL
Anion gap: 6 (ref 5–15)
BUN: 16 mg/dL (ref 6–20)
CALCIUM: 9.7 mg/dL (ref 8.9–10.3)
CO2: 32 mmol/L (ref 22–32)
CREATININE: 0.88 mg/dL (ref 0.61–1.24)
Chloride: 102 mmol/L (ref 101–111)
GFR calc Af Amer: >60 mL/min (ref >60)
Glucose, Bld: 271 mg/dL — ABNORMAL HIGH (ref 65–99)
Potassium: 4.4 mmol/L (ref 3.5–5.1)
SODIUM: 140 mmol/L (ref 135–145)

## 2015-02-13 NOTE — ED Notes (Signed)
Ace wrap applied to left lower leg by er tech julie.  D/c inst to pt.

## 2015-02-13 NOTE — Discharge Instructions (Signed)
If you have increased pain, swelling, fever, chills, nausea, vomiting, shortness of breath, increased swelling, difficulty walking, numbness, weakness, or you feel worse in any way return to the emergency room. At this time, it appears that the bruise, or hematoma, that he suffered when you fell is tracking down towards her foot which is not unusual. However, if there are any new or worrisome symptoms to you including but not limited to what is listed above, please return to the emergency department. I would ask that you follow closely with her primary care doctor in a day or 2 tablets looked at to make sure resolution is continuing. I would keep her leg elevated, and I would use Ace wraps for the swelling.

## 2015-02-13 NOTE — ED Notes (Signed)
Injured left leg a couple of weeks ago, now leg is turning black all the way down to my toes.  Injured left leg just below knee.  Scabbed area seen.  eccymosis seen to left thigh, left inner lower leb and foot.  + DP pulse palpable.  +1 swelling to left lower leg.

## 2015-02-13 NOTE — ED Notes (Signed)
States 2 weks ago fell out in the yard, has old abrasion to left area below knee and had has sweeling bruising to leg. Positive pulses

## 2015-02-13 NOTE — ED Provider Notes (Addendum)
[facility] Emergency Department Provider Note  ____________________________________________   I have reviewed the triage vital signs and the nursing notes.   HISTORY  Chief Complaint Leg Swelling and Leg Injury    HPI Stanley Mclean is a 74 y.o. male presents today complaining of bruising that is going down his leg. He fell he believes 2 weeks ago and sustained a significant bruise to the left pretibial region with an abrasion. That is healed. He is not having any pain. However, there was a very large bruise. Patient is on Plavix. He is not otherwise anticoagulated. He only has one leg because of a lawnmower accident, and he fell because he lost his footing. It was a non-syncopal fall. No other injury. The patient states that he noticed the bruising around the upper pretibial region below the knee and he is concerned because of bruising seems with track down to his foot. His knee itself does not hurt he is able to bear weight he has no pain. He does not believe that there is a broken bone. He has had no melena bright red blood per rectum or hematemesis he has had no easy bruising or bleeding otherwise. No other trauma noted. He does not pain with there is bruising around the sides of the foot. He has no shortness of breath he has no personal or family history of PE or DVT and denies recent travel or surgery.  Past Medical History  Diagnosis Date  . Type II or unspecified type diabetes mellitus without mention of complication, not stated as uncontrolled   . Unspecified essential hypertension   . Pure hypercholesterolemia   . Traumatic amputation of leg(s) (complete) (partial), unilateral, level not specified, without mention of complication   . GERD (gastroesophageal reflux disease)   . CAD (coronary artery disease)     a. s/p multiple prior PCIs b. s/p DES-distal RCA 05/2013  . Tobacco abuse   . Macular degeneration   . COPD (chronic obstructive pulmonary disease)  ([place])   . Anemia   . BPH (benign prostatic hyperplasia)     Patient Active Problem List   Diagnosis Date Noted  . Asthmatic bronchitis 01/05/2015  . Type 2 diabetes mellitus without complication ([place]) 70/26/3785  . Sensation of cold in leg 06/19/2014  . Essential hypertension 06/22/2013  . Chest pain 06/01/2013  . Coronary atherosclerosis of native coronary artery 06/01/2013  . SOB (shortness of breath) 06/01/2013  . Smoker 06/01/2013  . S/P coronary artery stent placement 06/01/2013  . Diabetes type 2, controlled ([place]) 06/01/2013  . Angina pectoris ([place]) 06/01/2013  . Amputation of leg 06/01/2013    Past Surgical History  Procedure Laterality Date  . Amputation  08/26/2011  . Appendectomy    . Cardiac catheterization    . Coronary angioplasty with stent placement  09/28/2006    RCA  . Coronary angioplasty with stent placement  07/21/2006    Mid LAD  . Coronary angioplasty with stent placement  06/06/2013    30% prox LAD at the site of a prior stent, 40% mid LAD at the origin of D1, 40% ostial D1, 30% prox LCx at the site of a prior stent, in the proximal third of the vessel segment, 20% prox RCA, 99% distal RCA s/p DES. EF >55%.    Current Outpatient Rx  Name  Route  Sig  Dispense  Refill  . albuterol (PROVENTIL HFA;VENTOLIN HFA) 108 (90 BASE) MCG/ACT inhaler   Inhalation   Inhale 2 puffs into  the lungs every 6 (six) hours as needed for wheezing or shortness of breath.         Marland Kitchen aspirin 81 MG tablet   Oral   Take 81 mg by mouth daily.         Marland Kitchen azithromycin (ZITHROMAX) 250 MG tablet      Take 2 tablets on day 1, then 1 tablet daily on days 2-5.   6 tablet   0   . budesonide-formoterol (SYMBICORT) 160-4.5 MCG/ACT inhaler   Inhalation   Inhale 2 puffs into the lungs 2 (two) times daily.         . clopidogrel (PLAVIX) 75 MG tablet   Oral   Take 1 tablet (75 mg total) by mouth daily.   90 tablet   01   . FLUZONE HIGH-DOSE 0.5 ML SUSY      TO BE ADMINISTERED  BY PHARMACIST FOR IMMUNIZATION      0     Dispense as written.   Marland Kitchen ipratropium (ATROVENT) 0.03 % nasal spray   Each Nare   Place 2 sprays into both nostrils daily.          Marland Kitchen lisinopril (PRINIVIL,ZESTRIL) 40 MG tablet      TAKE ONE (1) TABLET EACH DAY   90 tablet   3   . metoprolol (LOPRESSOR) 100 MG tablet   Oral   Take 1 tablet (100 mg total) by mouth 2 (two) times daily.   180 tablet   3   . nitroGLYCERIN (NITROSTAT) 0.4 MG SL tablet   Sublingual   Place 0.4 mg under the tongue every 5 (five) minutes as needed for chest pain.         . predniSONE (DELTASONE) 10 MG tablet      Take 3 pills a day for 3 days, then 2 pills a day for 3 days, then 1 pill a day for 3 days   18 tablet   0   . simvastatin (ZOCOR) 20 MG tablet   Oral   Take 1 tablet (20 mg total) by mouth daily.   90 tablet   3   . terazosin (HYTRIN) 1 MG capsule   Oral   Take 1 capsule (1 mg total) by mouth once.   90 capsule   3     Allergies Review of patient's allergies indicates no known allergies.  Family History  Problem Relation Age of Onset  . Mental illness Mother     Social History Social History  Substance Use Topics  . Smoking status: Current Every Day Smoker -- 1.00 packs/day for 50 years    Types: Cigarettes  . Smokeless tobacco: None  . Alcohol Use: No    Review of Systems Constitutional: No fever/chills Eyes: No visual changes. ENT: No sore throat. No stiff neck no neck pain Cardiovascular: Denies chest pain. Respiratory: Denies shortness of breath. Gastrointestinal:   no vomiting.  No diarrhea.  No constipation. Genitourinary: Negative for dysuria. Musculoskeletal: Negative lower extremity swelling Skin: Negative for rash. Neurological: Negative for headaches, focal weakness or numbness. 10-point ROS otherwise negative.  ____________________________________________   PHYSICAL EXAM:  VITAL SIGNS: ED Triage Vitals  Enc Vitals Group     BP 02/13/15 1258  174/68 mmHg     Pulse Rate 02/13/15 1258 78     Resp 02/13/15 1258 18     Temp 02/13/15 1258 97.5 F (36.4 C)     Temp Source 02/13/15 1258 Oral     SpO2 02/13/15 1258 97 %  Weight 02/13/15 1258 160 lb (72.576 kg)     Height 02/13/15 1258 _0  (1.753 m)     Head Cir --      Peak Flow --      Pain Score 02/13/15 1307 0     Pain Loc --      Pain Edu? --      Excl. in [place]? --     Constitutional: Alert and oriented. Well appearing and in no acute distress. Eyes: Conjunctivae are normal. PERRL. EOMI. Head: Atraumatic. Nose: No congestion/rhinnorhea. Mouth/Throat: Mucous membranes are moist.  Oropharynx non-erythematous. Neck: No stridor.   Nontender with no meningismus Cardiovascular: Normal rate, regular rhythm. Grossly normal heart sounds.  Good peripheral circulation. Respiratory: Normal respiratory effort.  No retractions. Lungs CTAB. Abdominal: Soft and nontender. No distention. No guarding no rebound Back:  There is no focal tenderness or step off there is no midline tenderness there are no lesions noted. there is no CVA tenderness Musculoskeletal: Right lower leg agitation noted No lower extremity tenderness. No joint effusions, no DVT signs strong distal pulses there is an area of bruising to the pretibial region with healing abrasions just distal to the knee. Does not involve the knee itself. This area is not markedly tender to touch or fluctuant. There is no erythema or warmth to the area. There is however some residual bruising and there is bruising noted principally around the sides of the foot both medially and laterally. Patient has very strong distal pulses there is no calf pain or tenderness there is mild edema noted in the extremity. Neurologic:  Normal speech and language. No gross focal neurologic deficits are appreciated.  Skin:  Skin is warm, dry and intact. No rash noted. Psychiatric: Mood and affect are normal. Speech and behavior are  normal.  ____________________________________________   LABS (all labs ordered are listed, but only abnormal results are displayed)  Labs Reviewed  BASIC METABOLIC PANEL - Abnormal; Notable for the following:    Glucose, Bld 271 (*)    All other components within normal limits  CBC WITH DIFFERENTIAL/PLATELET   ____________________________________________  EKG  I personally interpreted any EKGs ordered by me or triage  ____________________________________________  RADIOLOGY  I reviewed any imaging ordered by me or triage that were performed during my shift ____________________________________________   PROCEDURES  Procedure(s) performed: None  Critical Care performed: None  ____________________________________________   INITIAL IMPRESSION / ASSESSMENT AND PLAN / ED COURSE  Pertinent labs & imaging results that were available during my care of the patient were reviewed by me and considered in my medical decision making (see chart for details).  Patient with a bruise to the pretibial region which appeared to have been quite large what happened according to patient and what I can see of its residual. The bruise is now tracked down the leg following gravity. He has strong distal pulses is no evidence at this time of PE or DVT or compartment syndrome there is no evidence of cellulitis or infection there is no evidence of vascular compromise or peripheral vascular disease there is no evidence of  thermal cytopenias seen on blood work. He is on Plavix and I believe that this is likely why he had such a significant bruising result. He has not suffered from any pain this time. I will advise him rest compression and elevation and we'll have him follow closely with his primary care doctor to ensure resolution of symptoms.  ----------------------------------------- 4:44 PM on 02/13/2015 -----------------------------------------  Without any  symptoms here with a concern patient able  tablet with no difficulty no evidence of claudication, he will follow closely. Return precautions and follow-up given and understood ____________________________________________   FINAL CLINICAL IMPRESSION(S) / ED DIAGNOSES  Final diagnoses:  None     Schuyler Amor, MD 02/13/15 [place], MD 02/13/15 [phone]

## 2015-02-26 ENCOUNTER — Other Ambulatory Visit: Payer: Self-pay | Admitting: Cardiovascular Disease

## 2015-02-26 NOTE — Telephone Encounter (Signed)
Pt would like Simvastatin refill. 90 day

## 2015-03-27 ENCOUNTER — Ambulatory Visit (INDEPENDENT_AMBULATORY_CARE_PROVIDER_SITE_OTHER): Payer: PPO | Admitting: Cardiovascular Disease

## 2015-03-27 ENCOUNTER — Encounter: Payer: Self-pay | Admitting: Cardiovascular Disease

## 2015-03-27 VITALS — BP 140/80 | HR 68 | Temp 98.0°F | Ht 69.0 in | Wt 148.8 lb

## 2015-03-27 DIAGNOSIS — J432 Centrilobular emphysema: Secondary | ICD-10-CM | POA: Insufficient documentation

## 2015-03-27 DIAGNOSIS — I25111 Atherosclerotic heart disease of native coronary artery with angina pectoris with documented spasm: Secondary | ICD-10-CM

## 2015-03-27 DIAGNOSIS — R0602 Shortness of breath: Secondary | ICD-10-CM

## 2015-03-27 DIAGNOSIS — E119 Type 2 diabetes mellitus without complications: Secondary | ICD-10-CM

## 2015-03-27 DIAGNOSIS — J449 Chronic obstructive pulmonary disease, unspecified: Secondary | ICD-10-CM | POA: Insufficient documentation

## 2015-03-27 DIAGNOSIS — I209 Angina pectoris, unspecified: Secondary | ICD-10-CM | POA: Diagnosis not present

## 2015-03-27 DIAGNOSIS — F17200 Nicotine dependence, unspecified, uncomplicated: Secondary | ICD-10-CM

## 2015-03-27 DIAGNOSIS — Z720 Tobacco use: Secondary | ICD-10-CM

## 2015-03-27 DIAGNOSIS — I10 Essential (primary) hypertension: Secondary | ICD-10-CM

## 2015-03-27 DIAGNOSIS — J4540 Moderate persistent asthma, uncomplicated: Secondary | ICD-10-CM

## 2015-03-27 MED ORDER — AZITHROMYCIN 250 MG PO TABS: ORAL_TABLET | ORAL | Status: DC

## 2015-03-27 MED ORDER — PREDNISONE 20 MG PO TABS
20.0000 mg | ORAL_TABLET | Freq: Every day | ORAL | Status: DC
Start: 2015-03-27 — End: 2016-03-30

## 2015-03-27 NOTE — Assessment & Plan Note (Signed)
Hemoglobin A1c 6.6, managed by Dr. Luan Pulling

## 2015-03-27 NOTE — Patient Instructions (Signed)
You are doing well.  Please start Zpak 2 pills today then one pill daily x 5 days  Start prednisone : 60 mg daily x 3 days Then 40 mg daily x 3 days Then 20 mg daily x 3 days Then 10 mg daily x 3 days  Please call us if you have new issues that need to be addressed before your next appt.  Your physician wants you to follow-up in: 6 months.  You will receive a reminder letter in the mail two months in advance. If you don't receive a letter, please call our office to schedule the follow-up appointment.

## 2015-03-27 NOTE — Assessment & Plan Note (Signed)
Blood pressure is well controlled on today's visit. No changes made to the medications. 

## 2015-03-27 NOTE — Assessment & Plan Note (Signed)
Currently with no symptoms of angina. No further workup at this time. Continue current medication regimen. 

## 2015-03-27 NOTE — Assessment & Plan Note (Signed)
He denies any symptoms concerning for angina, High risk of recurrent coronary disease given his continued smoking We'll have a low threshold for catheterization

## 2015-03-27 NOTE — Assessment & Plan Note (Signed)
Chronic baseline shortness of breath, on inhalers COPD exacerbation on today's visit

## 2015-03-27 NOTE — Assessment & Plan Note (Signed)
He reports recurrent bronchitis symptoms and is requesting prednisone, antibiotic Wife recently developed similar symptoms, feels he got infected from her  prednisone and Z-Pak has been sent into the pharmacy Recommended he follow-up with Dr. Luan Pulling if symptoms persist, may need further workup, even referral to pulmonary Recommended smoking cessation

## 2015-03-27 NOTE — Progress Notes (Signed)
Patient ID: Stanley Mclean, male    DOB: 1940-07-21, 75 y.o.   MRN: [id number]  HPI Comments: Stanley Mclean is a pleasant 75 year old gentleman with a long smoking history who continues to smoke one pack per day, history of coronary artery disease, stent to his mid LAD, 2 stents to his proximal left circumflex in May 2008, stent to his proximal RCA July 2008, previous anginal symptoms , patient of Dr. Luan Pulling, who presents for routine followup of his coronary artery disease History of amputation of the right lower extremity from lawnmower accident 2 years ago  In follow-up today, he reports having recurrent bronchitis 2 weeks of worsening shortness of breath, cough Unable to walk very far without having to stop Treated with prednisone and Z-Pak in October 2016 for bronchitis, with improvement of his symptoms   wife recently went shopping, got upper respiratory infection, now he has it again   Denies any recent chest pain symptoms Had chest pain on his last clinic visit, declined cardiac catheterization at that time Continues to smoke more than one pack per day  EKG on today's visit shows normal sinus rhythm with rate 68 bpm, nonspecific ST abnormality   Other past medical history  cardiac catheterization 06/06/2013, found to have severe distal RCA disease, mild in-stent restenosis in the circumflex and LAD, normal ejection fraction. 40% mid LAD disease, 40% B1 disease, 30% proximal circumflex also noted. He had Xience stent 2.5 x 15 mm placed in the distal RCA.   Previous lab work, Hemoglobin A1c 6.3  History of neuropathy.   No Known Allergies  Outpatient Encounter Prescriptions as of 03/27/2015  Medication Sig  . albuterol (PROVENTIL HFA;VENTOLIN HFA) 108 (90 BASE) MCG/ACT inhaler Inhale 2 puffs into the lungs every 6 (six) hours as needed for wheezing or shortness of breath.  Marland Kitchen aspirin 81 MG tablet Take 81 mg by mouth daily.  . budesonide-formoterol (SYMBICORT) 160-4.5 MCG/ACT inhaler  Inhale 2 puffs into the lungs 2 (two) times daily.  . clopidogrel (PLAVIX) 75 MG tablet Take 1 tablet (75 mg total) by mouth daily.  Marland Kitchen FLUZONE HIGH-DOSE 0.5 ML SUSY TO BE ADMINISTERED BY PHARMACIST FOR IMMUNIZATION  . ipratropium (ATROVENT) 0.03 % nasal spray Place 2 sprays into both nostrils daily.   Marland Kitchen lisinopril (PRINIVIL,ZESTRIL) 40 MG tablet TAKE ONE (1) TABLET EACH DAY  . metoprolol (LOPRESSOR) 100 MG tablet Take 1 tablet (100 mg total) by mouth 2 (two) times daily.  . nitroGLYCERIN (NITROSTAT) 0.4 MG SL tablet Place 0.4 mg under the tongue every 5 (five) minutes as needed for chest pain.  . simvastatin (ZOCOR) 20 MG tablet TAKE ONE (1) TABLET BY MOUTH EVERY DAY  . terazosin (HYTRIN) 1 MG capsule Take 1 capsule (1 mg total) by mouth once.  Marland Kitchen azithromycin (ZITHROMAX) 250 MG tablet Please take 2 pills the first day, then one a day  . predniSONE (DELTASONE) 20 MG tablet Take 1 tablet (20 mg total) by mouth daily with breakfast.  . [DISCONTINUED] azithromycin (ZITHROMAX) 250 MG tablet Take 2 tablets on day 1, then 1 tablet daily on days 2-5. (Patient not taking: Reported on 03/27/2015)  . [DISCONTINUED] predniSONE (DELTASONE) 10 MG tablet Take 3 pills a day for 3 days, then 2 pills a day for 3 days, then 1 pill a day for 3 days (Patient not taking: Reported on 03/27/2015)   No facility-administered encounter medications on file as of 03/27/2015.    Past Medical History  Diagnosis Date  . Type II or  unspecified type diabetes mellitus without mention of complication, not stated as uncontrolled   . Unspecified essential hypertension   . Pure hypercholesterolemia   . Traumatic amputation of leg(s) (complete) (partial), unilateral, level not specified, without mention of complication   . GERD (gastroesophageal reflux disease)   . CAD (coronary artery disease)     a. s/p multiple prior PCIs b. s/p DES-distal RCA 05/2013  . Tobacco abuse   . Macular degeneration   . COPD (chronic obstructive  pulmonary disease) ([place])   . Anemia   . BPH (benign prostatic hyperplasia)     Past Surgical History  Procedure Laterality Date  . Amputation  08/26/2011  . Appendectomy    . Cardiac catheterization    . Coronary angioplasty with stent placement  09/28/2006    RCA  . Coronary angioplasty with stent placement  07/21/2006    Mid LAD  . Coronary angioplasty with stent placement  06/06/2013    30% prox LAD at the site of a prior stent, 40% mid LAD at the origin of D1, 40% ostial D1, 30% prox LCx at the site of a prior stent, in the proximal third of the vessel segment, 20% prox RCA, 99% distal RCA s/p DES. EF >55%.    Social History  reports that he has been smoking Cigarettes.  He has a 50 pack-year smoking history. He does not have any smokeless tobacco history on file. He reports that he does not drink alcohol or use illicit drugs.  Family History family history includes Mental illness in his mother.  Review of Systems  Constitutional: Negative.   Respiratory: Negative.   Cardiovascular: Negative.   Gastrointestinal: Negative.   Musculoskeletal: Positive for gait problem.       Cold leg on the right below the knee, site of amputation  Skin: Negative.   Neurological: Negative.   Hematological: Negative.   Psychiatric/Behavioral: Negative.   All other systems reviewed and are negative.   BP 140/80 mmHg  Pulse 68  Temp(Src) 98 F (36.7 C)  Ht 5' 9" (1.753 m)  Wt 148 lb 12 oz (67.473 kg)  BMI 21.96 kg/m2  Physical Exam  Constitutional: He is oriented to person, place, and time. He appears well-developed and well-nourished.  HENT:  Head: Normocephalic.  Nose: Nose normal.  Mouth/Throat: Oropharynx is clear and moist.  Eyes: Conjunctivae are normal. Pupils are equal, round, and reactive to light.  Neck: Normal range of motion. Neck supple. No JVD present.  Cardiovascular: Normal rate, regular rhythm, S1 normal, S2 normal, normal heart sounds and intact distal pulses.  Exam  reveals no gallop and no friction rub.   No murmur heard. Pulmonary/Chest: Effort normal and breath sounds normal. No respiratory distress. He has no wheezes. He has no rales. He exhibits no tenderness.  Abdominal: Soft. Bowel sounds are normal. He exhibits no distension. There is no tenderness.  Musculoskeletal: Normal range of motion. He exhibits no edema or tenderness.   Right lower extremity amputation below the knee  Lymphadenopathy:    He has no cervical adenopathy.  Neurological: He is alert and oriented to person, place, and time. Coordination normal.  Skin: Skin is warm and dry. No rash noted. No erythema.  Psychiatric: He has a normal mood and affect. His behavior is normal. Judgment and thought content normal.      Assessment and Plan   Nursing note and vitals reviewed.

## 2015-03-27 NOTE — Assessment & Plan Note (Signed)
We have encouraged him to continue to work on weaning his cigarettes and smoking cessation. He will continue to work on this and does not want any assistance with chantix.  

## 2015-05-01 ENCOUNTER — Other Ambulatory Visit: Payer: Self-pay

## 2015-05-01 MED ORDER — CLOPIDOGREL BISULFATE 75 MG PO TABS: 75.0000 mg | ORAL_TABLET | Freq: Every day | ORAL | Status: DC

## 2015-05-01 NOTE — Telephone Encounter (Signed)
90 day supply

## 2015-07-03 ENCOUNTER — Other Ambulatory Visit: Payer: Self-pay

## 2015-07-03 MED ORDER — METOPROLOL TARTRATE 100 MG PO TABS: 100.0000 mg | ORAL_TABLET | Freq: Two times a day (BID) | ORAL | Status: DC

## 2015-07-03 NOTE — Telephone Encounter (Signed)
90 day supply

## 2015-08-10 DIAGNOSIS — H34832 Tributary (branch) retinal vein occlusion, left eye, with macular edema: Secondary | ICD-10-CM | POA: Diagnosis not present

## 2015-08-10 LAB — HM DIABETES EYE EXAM

## 2015-08-24 ENCOUNTER — Encounter: Payer: Self-pay | Admitting: Family Medicine

## 2015-09-03 ENCOUNTER — Telehealth: Payer: Self-pay | Admitting: Cardiovascular Disease

## 2015-09-03 MED ORDER — LISINOPRIL 40 MG PO TABS: ORAL_TABLET | ORAL | Status: DC

## 2015-09-03 MED ORDER — NITROGLYCERIN 0.4 MG SL SUBL: 0.4000 mg | SUBLINGUAL_TABLET | SUBLINGUAL | Status: DC | PRN

## 2015-09-03 MED ORDER — SIMVASTATIN 20 MG PO TABS: ORAL_TABLET | ORAL | Status: DC

## 2015-09-03 NOTE — Telephone Encounter (Signed)
Refill sent for simvastatin, Lisinopril and NTG.

## 2015-09-03 NOTE — Telephone Encounter (Signed)
FYI.Marland KitchenMarland KitchenCalled patient to schedule a year f/u on his Carotid u/s and patient doesn't want to schedule right now. He will discuss this with Dr. Rockey Situ at his next appointment.

## 2015-09-03 NOTE — Telephone Encounter (Signed)
*  STAT* If patient is at the pharmacy, call can be transferred to refill team.   1. Which medications need to be refilled? (please list name of each medication and dose if known) Simvastatin, Lisinopril, Nitro,   2. Which pharmacy/location (including street and city if local pharmacy) is medication to be sent to? [facility]  3. Do they need a 30 day or 90 day supply? [place]

## 2015-10-09 ENCOUNTER — Other Ambulatory Visit: Payer: Self-pay | Admitting: Family Medicine

## 2015-10-09 DIAGNOSIS — N400 Enlarged prostate without lower urinary tract symptoms: Secondary | ICD-10-CM

## 2016-01-15 ENCOUNTER — Ambulatory Visit (INDEPENDENT_AMBULATORY_CARE_PROVIDER_SITE_OTHER): Payer: PPO | Admitting: Cardiovascular Disease

## 2016-01-15 ENCOUNTER — Encounter: Payer: Self-pay | Admitting: Cardiovascular Disease

## 2016-01-15 VITALS — BP 200/90 | HR 73 | Ht 69.0 in | Wt 150.0 lb

## 2016-01-15 DIAGNOSIS — I10 Essential (primary) hypertension: Secondary | ICD-10-CM

## 2016-01-15 DIAGNOSIS — F17200 Nicotine dependence, unspecified, uncomplicated: Secondary | ICD-10-CM

## 2016-01-15 DIAGNOSIS — R0602 Shortness of breath: Secondary | ICD-10-CM

## 2016-01-15 DIAGNOSIS — I25119 Atherosclerotic heart disease of native coronary artery with unspecified angina pectoris: Secondary | ICD-10-CM | POA: Diagnosis not present

## 2016-01-15 DIAGNOSIS — E119 Type 2 diabetes mellitus without complications: Secondary | ICD-10-CM

## 2016-01-15 MED ORDER — AMLODIPINE BESYLATE 10 MG PO TABS: 10.0000 mg | ORAL_TABLET | Freq: Every day | ORAL | 3 refills | Status: DC

## 2016-01-15 MED ORDER — TERAZOSIN HCL 1 MG PO CAPS: 1.0000 mg | ORAL_CAPSULE | Freq: Every day | ORAL | 3 refills | Status: DC

## 2016-01-15 MED ORDER — LISINOPRIL 40 MG PO TABS: ORAL_TABLET | ORAL | 3 refills | Status: DC

## 2016-01-15 NOTE — Patient Instructions (Addendum)
Medication Instructions:   Please start amlodipine one a day Please call if blood pressure does not improve  Labwork:  No new labs needed  Testing/Procedures:  No further testing at this time   Follow-Up: It was a pleasure seeing you in the office today. Please call us if you have new issues that need to be addressed before your next appt.  [phone]  Your physician wants you to follow-up in: 6 months.  You will receive a reminder letter in the mail two months in advance. If you don't receive a letter, please call our office to schedule the follow-up appointment.  If you need a refill on your cardiac medications before your next appointment, please call your pharmacy.

## 2016-01-15 NOTE — Progress Notes (Signed)
Cardiology Office Note  Date:  01/15/2016   ID:  Stanley, Mclean 1941/02/06, MRN [id number]  PCP:  [name], MD   Chief Complaint  Patient presents with  . Coronary artery disease involving native coronary artery of   . Shortness of Breath    HPI:  Stanley Mclean is a pleasant 75 year old gentleman with a long smoking history who continues to smoke one pack per day, history of coronary artery disease, stent to his mid LAD, 2 stents to his proximal left circumflex in May 2008, stent to his proximal RCA July 2008, previous anginal symptoms , patient of Dr. Luan Pulling, who presents for routine followup of his coronary artery disease History of amputation of the right lower extremity from lawnmower accident 2 years ago  In follow-up today, he reports blood pressure has been running mildly elevated at home Systolic pressure typically 150, 160s Denies any new symptoms, no chest pain, no shortness of breath  Previous episodes of recurrent bronchitis   previously treated withe and Z-Pak in October 2016 for bronchitis,  Had chest pain onprevious  clinic visit, declined cardiac catheterization at that time Continues to smoke more than one pack per day  EKG on today's visit shows normal sinus rhythm with rate 63 bpm, nonspecific ST abnormality   Other past medical history  cardiac catheterization 06/06/2013, found to have severe distal RCA disease, mild in-stent restenosis in the circumflex and LAD, normal ejection fraction. 40% mid LAD disease, 40% B1 disease, 30% proximal circumflex also noted. He had Xience stent 2.5 x 15 mm placed in the distal RCA.   Previous lab work, Hemoglobin A1c 6.3  History of neuropathy.   PMH:   has a past medical history of Anemia; BPH (benign prostatic hyperplasia); CAD (coronary artery disease); COPD (chronic obstructive pulmonary disease) ([place]); GERD (gastroesophageal reflux disease); Macular degeneration; Pure hypercholesterolemia; Tobacco  abuse; Traumatic amputation of leg(s) (complete) (partial), unilateral, level not specified, without mention of complication; Type II or unspecified type diabetes mellitus without mention of complication, not stated as uncontrolled; and Unspecified essential hypertension.  PSH:    Past Surgical History:  Procedure Laterality Date  . AMPUTATION  08/26/2011  . APPENDECTOMY    . CARDIAC CATHETERIZATION    . CORONARY ANGIOPLASTY WITH STENT PLACEMENT  09/28/2006   RCA  . CORONARY ANGIOPLASTY WITH STENT PLACEMENT  07/21/2006   Mid LAD  . CORONARY ANGIOPLASTY WITH STENT PLACEMENT  06/06/2013   30% prox LAD at the site of a prior stent, 40% mid LAD at the origin of D1, 40% ostial D1, 30% prox LCx at the site of a prior stent, in the proximal third of the vessel segment, 20% prox RCA, 99% distal RCA s/p DES. EF >55%.    Current Outpatient Prescriptions  Medication Sig Dispense Refill  . albuterol (PROVENTIL HFA;VENTOLIN HFA) 108 (90 BASE) MCG/ACT inhaler Inhale 2 puffs into the lungs every 6 (six) hours as needed for wheezing or shortness of breath.    Marland Kitchen aspirin 81 MG tablet Take 81 mg by mouth daily.    Marland Kitchen azithromycin (ZITHROMAX) 250 MG tablet Please take 2 pills the first day, then one a day 6 each 0  . budesonide-formoterol (SYMBICORT) 160-4.5 MCG/ACT inhaler Inhale 2 puffs into the lungs 2 (two) times daily.    . clopidogrel (PLAVIX) 75 MG tablet Take 1 tablet (75 mg total) by mouth daily. 90 tablet 3  . FLUZONE HIGH-DOSE 0.5 ML SUSY TO BE ADMINISTERED BY PHARMACIST FOR IMMUNIZATION  0  .  ipratropium (ATROVENT) 0.03 % nasal spray Place 2 sprays into both nostrils daily.     Marland Kitchen lisinopril (PRINIVIL,ZESTRIL) 40 MG tablet TAKE ONE (1) TABLET EACH DAY 90 tablet 3  . metoprolol (LOPRESSOR) 100 MG tablet Take 1 tablet (100 mg total) by mouth 2 (two) times daily. 180 tablet 3  . nitroGLYCERIN (NITROSTAT) 0.4 MG SL tablet Place 1 tablet (0.4 mg total) under the tongue every 5 (five) minutes as needed for  chest pain. 25 tablet 0  . simvastatin (ZOCOR) 20 MG tablet TAKE ONE (1) TABLET BY MOUTH EVERY DAY 90 tablet 3  . terazosin (HYTRIN) 1 MG capsule Take 1 capsule (1 mg total) by mouth at bedtime. 90 capsule 3  . amLODipine (NORVASC) 10 MG tablet Take 1 tablet (10 mg total) by mouth daily. 90 tablet 3  . predniSONE (DELTASONE) 20 MG tablet Take 1 tablet (20 mg total) by mouth daily with breakfast. 20 tablet 0   No current facility-administered medications for this visit.      Allergies:   Review of patient's allergies indicates no known allergies.   Social History:  The patient  reports that he has been smoking Cigarettes.  He has a 50.00 pack-year smoking history. He has never used smokeless tobacco. He reports that he does not drink alcohol or use drugs.   Family History:   family history includes Mental illness in his mother.    Review of Systems: Review of Systems  Constitutional: Negative.   Respiratory: Negative.   Cardiovascular: Negative.   Gastrointestinal: Negative.   Musculoskeletal: Negative.   Neurological: Negative.   Psychiatric/Behavioral: Negative.   All other systems reviewed and are negative.    PHYSICAL EXAM: VS:  BP (!) 200/90 (BP Location: Left Arm, Patient Position: Sitting, Cuff Size: Normal)   Pulse 73   Ht _0  (1.753 m)   Wt 150 lb (68 kg)   SpO2 94%   BMI 22.15 kg/m  , BMI Body mass index is 22.15 kg/m. GEN: Well nourished, well developed, in no acute distress  HEENT: normal  Neck: no JVD,+  carotid bruit on the left1/2,  no masses Cardiac: RRR; no murmurs, rubs, or gallops,no edema  Respiratory: Decreased breath sounds moderately throughout,  normal work of breathing GI: soft, nontender, nondistended, + BS MS: no deformity or atrophy  Skin: warm and dry, no rash Neuro:  Strength and sensation are intact Psych: euthymic mood, full affect    Recent Labs: 02/13/2015: BUN 16; Creatinine, Ser 0.88; Hemoglobin 14.3; Platelets 250; Potassium  4.4; Sodium 140    Lipid Panel Lab Results  Component Value Date   CHOL 166 12/29/2013   HDL 43 12/29/2013   LDLCALC 69 12/29/2013   TRIG 270 (H) 12/29/2013      Wt Readings from Last 3 Encounters:  01/15/16 150 lb (68 kg)  03/27/15 148 lb 12 oz (67.5 kg)  02/13/15 160 lb (72.6 kg)       ASSESSMENT AND PLAN:  SOB (shortness of breath) - Plan: EKG 12-Lead Chronic shortness of breath, stable. No recent episodes of bronchitis. Recommended smoking cessation  Coronary artery disease involving native coronary artery of native heart with angina pectoris ([place]) - Plan: EKG 12-Lead Currently with no symptoms of angina. No further workup at this time. Continue current medication regimen.  Essential hypertension Blood pressure markedly elevated on today's visit. He reports that he is been compliant with his medications Recommended he start amlodipine 10 mg daily, closely monitor blood pressure at home Stay  on lisinopril and metoprolol He will call back with blood pressure numbers for our review  Smoker We have encouraged him to continue to work on weaning his cigarettes and smoking cessation. He will continue to work on this and does not want any assistance with chantix.   Controlled type 2 diabetes mellitus without complication, without long-term current use of insulin (HCC) Hemoglobin A1c 6.6,  diet-controlled    Total encounter time more than 25 minutes  Greater than 50% was spent in counseling and coordination of care with the patient   Disposition:   F/U  6 months   Orders Placed This Encounter  Procedures  . EKG 12-Lead     Signed, Stanley Mclean, M.D., Ph.D. 01/15/2016  [place], Stanley Mclean

## 2016-03-29 ENCOUNTER — Inpatient Hospital Stay
Admission: EM | Admit: 2016-03-29 | Discharge: 2016-03-31 | DRG: 066 | Disposition: A | Discharge status: Home Health | Payer: PPO | Attending: Internal Medicine | Admitting: Internal Medicine

## 2016-03-29 DIAGNOSIS — N400 Enlarged prostate without lower urinary tract symptoms: Secondary | ICD-10-CM | POA: Diagnosis not present

## 2016-03-29 DIAGNOSIS — E7800 Pure hypercholesterolemia, unspecified: Secondary | ICD-10-CM | POA: Diagnosis not present

## 2016-03-29 DIAGNOSIS — I639 Cerebral infarction, unspecified: Principal | ICD-10-CM | POA: Diagnosis present

## 2016-03-29 DIAGNOSIS — M6281 Muscle weakness (generalized): Secondary | ICD-10-CM

## 2016-03-29 DIAGNOSIS — I6529 Occlusion and stenosis of unspecified carotid artery: Secondary | ICD-10-CM

## 2016-03-29 DIAGNOSIS — F17210 Nicotine dependence, cigarettes, uncomplicated: Secondary | ICD-10-CM | POA: Diagnosis not present

## 2016-03-29 DIAGNOSIS — Z955 Presence of coronary angioplasty implant and graft: Secondary | ICD-10-CM

## 2016-03-29 DIAGNOSIS — H3530 Unspecified macular degeneration: Secondary | ICD-10-CM | POA: Diagnosis present

## 2016-03-29 DIAGNOSIS — I2510 Atherosclerotic heart disease of native coronary artery without angina pectoris: Secondary | ICD-10-CM | POA: Diagnosis not present

## 2016-03-29 DIAGNOSIS — J449 Chronic obstructive pulmonary disease, unspecified: Secondary | ICD-10-CM | POA: Diagnosis not present

## 2016-03-29 DIAGNOSIS — Z7982 Long term (current) use of aspirin: Secondary | ICD-10-CM

## 2016-03-29 DIAGNOSIS — Z79899 Other long term (current) drug therapy: Secondary | ICD-10-CM

## 2016-03-29 DIAGNOSIS — E119 Type 2 diabetes mellitus without complications: Secondary | ICD-10-CM | POA: Diagnosis present

## 2016-03-29 DIAGNOSIS — Z7951 Long term (current) use of inhaled steroids: Secondary | ICD-10-CM | POA: Diagnosis not present

## 2016-03-29 DIAGNOSIS — I10 Essential (primary) hypertension: Secondary | ICD-10-CM | POA: Diagnosis present

## 2016-03-29 DIAGNOSIS — Z8673 Personal history of transient ischemic attack (TIA), and cerebral infarction without residual deficits: Secondary | ICD-10-CM | POA: Diagnosis present

## 2016-03-29 DIAGNOSIS — R29702 NIHSS score 2: Secondary | ICD-10-CM | POA: Diagnosis not present

## 2016-03-29 DIAGNOSIS — Z5309 Procedure and treatment not carried out because of other contraindication: Secondary | ICD-10-CM

## 2016-03-29 DIAGNOSIS — Z7902 Long term (current) use of antithrombotics/antiplatelets: Secondary | ICD-10-CM

## 2016-03-29 DIAGNOSIS — K219 Gastro-esophageal reflux disease without esophagitis: Secondary | ICD-10-CM | POA: Diagnosis not present

## 2016-03-29 DIAGNOSIS — R200 Anesthesia of skin: Secondary | ICD-10-CM | POA: Diagnosis not present

## 2016-03-30 ENCOUNTER — Inpatient Hospital Stay (HOSPITAL_COMMUNITY)
Admit: 2016-03-30 | Discharge: 2016-03-30 | Disposition: A | Discharge status: Home / Self Care | Payer: PPO | Attending: Internal Medicine | Admitting: Internal Medicine

## 2016-03-30 ENCOUNTER — Encounter: Payer: Self-pay | Admitting: Emergency Medicine

## 2016-03-30 ENCOUNTER — Emergency Department: Payer: PPO

## 2016-03-30 ENCOUNTER — Inpatient Hospital Stay: Payer: PPO

## 2016-03-30 DIAGNOSIS — Z720 Tobacco use: Secondary | ICD-10-CM | POA: Diagnosis not present

## 2016-03-30 DIAGNOSIS — Z7902 Long term (current) use of antithrombotics/antiplatelets: Secondary | ICD-10-CM | POA: Diagnosis not present

## 2016-03-30 DIAGNOSIS — I639 Cerebral infarction, unspecified: Principal | ICD-10-CM

## 2016-03-30 DIAGNOSIS — Z79899 Other long term (current) drug therapy: Secondary | ICD-10-CM | POA: Diagnosis not present

## 2016-03-30 DIAGNOSIS — Z7982 Long term (current) use of aspirin: Secondary | ICD-10-CM | POA: Diagnosis not present

## 2016-03-30 DIAGNOSIS — I63231 Cerebral infarction due to unspecified occlusion or stenosis of right carotid arteries: Secondary | ICD-10-CM | POA: Diagnosis not present

## 2016-03-30 DIAGNOSIS — I2510 Atherosclerotic heart disease of native coronary artery without angina pectoris: Secondary | ICD-10-CM | POA: Diagnosis not present

## 2016-03-30 DIAGNOSIS — Z7951 Long term (current) use of inhaled steroids: Secondary | ICD-10-CM | POA: Diagnosis not present

## 2016-03-30 DIAGNOSIS — J449 Chronic obstructive pulmonary disease, unspecified: Secondary | ICD-10-CM | POA: Diagnosis not present

## 2016-03-30 DIAGNOSIS — Z01818 Encounter for other preprocedural examination: Secondary | ICD-10-CM | POA: Diagnosis not present

## 2016-03-30 DIAGNOSIS — R29702 NIHSS score 2: Secondary | ICD-10-CM | POA: Diagnosis not present

## 2016-03-30 DIAGNOSIS — N400 Enlarged prostate without lower urinary tract symptoms: Secondary | ICD-10-CM | POA: Diagnosis not present

## 2016-03-30 DIAGNOSIS — F17210 Nicotine dependence, cigarettes, uncomplicated: Secondary | ICD-10-CM | POA: Diagnosis not present

## 2016-03-30 DIAGNOSIS — I6350 Cerebral infarction due to unspecified occlusion or stenosis of unspecified cerebral artery: Secondary | ICD-10-CM

## 2016-03-30 DIAGNOSIS — I63233 Cerebral infarction due to unspecified occlusion or stenosis of bilateral carotid arteries: Secondary | ICD-10-CM | POA: Diagnosis not present

## 2016-03-30 DIAGNOSIS — H3530 Unspecified macular degeneration: Secondary | ICD-10-CM | POA: Diagnosis not present

## 2016-03-30 DIAGNOSIS — E7800 Pure hypercholesterolemia, unspecified: Secondary | ICD-10-CM | POA: Diagnosis not present

## 2016-03-30 DIAGNOSIS — R200 Anesthesia of skin: Secondary | ICD-10-CM | POA: Diagnosis not present

## 2016-03-30 DIAGNOSIS — E119 Type 2 diabetes mellitus without complications: Secondary | ICD-10-CM | POA: Diagnosis not present

## 2016-03-30 DIAGNOSIS — K219 Gastro-esophageal reflux disease without esophagitis: Secondary | ICD-10-CM | POA: Diagnosis not present

## 2016-03-30 DIAGNOSIS — I6523 Occlusion and stenosis of bilateral carotid arteries: Secondary | ICD-10-CM | POA: Diagnosis not present

## 2016-03-30 DIAGNOSIS — Z8673 Personal history of transient ischemic attack (TIA), and cerebral infarction without residual deficits: Secondary | ICD-10-CM | POA: Diagnosis present

## 2016-03-30 DIAGNOSIS — Z955 Presence of coronary angioplasty implant and graft: Secondary | ICD-10-CM | POA: Diagnosis not present

## 2016-03-30 DIAGNOSIS — I10 Essential (primary) hypertension: Secondary | ICD-10-CM | POA: Diagnosis not present

## 2016-03-30 LAB — URINALYSIS, ROUTINE W REFLEX MICROSCOPIC
BILIRUBIN URINE: NEGATIVE
GLUCOSE, UA: 50 mg/dL — AB
Ketones, ur: NEGATIVE mg/dL
NITRITE: POSITIVE — AB
Protein, ur: 30 mg/dL — AB
SPECIFIC GRAVITY, URINE: 1.01 (ref 1.005–1.030)
Squamous Epithelial / LPF: NONE SEEN
pH: 6 (ref 5.0–8.0)

## 2016-03-30 LAB — CBC WITH DIFFERENTIAL/PLATELET
Basophils Absolute: 0 10*3/uL (ref 0–0.1)
Basophils Relative: 1 %
EOS ABS: 0.2 10*3/uL (ref 0–0.7)
Eosinophils Relative: 3 %
HCT: 43.4 % (ref 40.0–52.0)
Hemoglobin: 15.1 g/dL (ref 13.0–18.0)
LYMPHS ABS: 1.1 10*3/uL (ref 1.0–3.6)
LYMPHS PCT: 14 %
MCH: 31.5 pg (ref 26.0–34.0)
MCHC: 34.8 g/dL (ref 32.0–36.0)
MCV: 90.6 fL (ref 80.0–100.0)
MONO ABS: 0.6 10*3/uL (ref 0.2–1.0)
Monocytes Relative: 8 %
Neutro Abs: 5.8 10*3/uL (ref 1.4–6.5)
Neutrophils Relative %: 74 %
PLATELETS: 198 10*3/uL (ref 150–440)
RBC: 4.79 MIL/uL (ref 4.40–5.90)
RDW: 13.4 % (ref 11.5–14.5)
WBC: 7.8 10*3/uL (ref 3.8–10.6)

## 2016-03-30 LAB — URINE DRUG SCREEN, QUALITATIVE (ARMC ONLY)
Amphetamines, Ur Screen: NOT DETECTED
BARBITURATES, UR SCREEN: NOT DETECTED
BENZODIAZEPINE, UR SCRN: NOT DETECTED
CANNABINOID 50 NG, UR ~~LOC~~: NOT DETECTED
Cocaine Metabolite,Ur ~~LOC~~: NOT DETECTED
MDMA (Ecstasy)Ur Screen: NOT DETECTED
Methadone Scn, Ur: NOT DETECTED
Opiate, Ur Screen: NOT DETECTED
PHENCYCLIDINE (PCP) UR S: NOT DETECTED
TRICYCLIC, UR SCREEN: NOT DETECTED

## 2016-03-30 LAB — PROTIME-INR
INR: 0.95
PROTHROMBIN TIME: 12.7 s (ref 11.4–15.2)

## 2016-03-30 LAB — LIPID PANEL
Cholesterol: 158 mg/dL (ref 0–200)
HDL: 48 mg/dL (ref >40)
LDL Cholesterol: 67 mg/dL (ref 0–99)
Total CHOL/HDL Ratio: 3.3 RATIO
Triglycerides: 215 mg/dL — ABNORMAL HIGH (ref <150)
VLDL: 43 mg/dL — ABNORMAL HIGH (ref 0–40)

## 2016-03-30 LAB — TROPONIN I

## 2016-03-30 LAB — COMPREHENSIVE METABOLIC PANEL
ALT: 15 U/L — ABNORMAL LOW (ref 17–63)
ANION GAP: 10 (ref 5–15)
AST: 30 U/L (ref 15–41)
Albumin: 4.3 g/dL (ref 3.5–5.0)
Alkaline Phosphatase: 89 U/L (ref 38–126)
BUN: 16 mg/dL (ref 6–20)
CHLORIDE: 101 mmol/L (ref 101–111)
CO2: 29 mmol/L (ref 22–32)
CREATININE: 0.87 mg/dL (ref 0.61–1.24)
Calcium: 9.7 mg/dL (ref 8.9–10.3)
GFR calc Af Amer: >60 mL/min (ref >60)
Glucose, Bld: 206 mg/dL — ABNORMAL HIGH (ref 65–99)
Potassium: 3.7 mmol/L (ref 3.5–5.1)
SODIUM: 140 mmol/L (ref 135–145)
Total Bilirubin: 0.6 mg/dL (ref 0.3–1.2)
Total Protein: 7.2 g/dL (ref 6.5–8.1)

## 2016-03-30 LAB — GLUCOSE, CAPILLARY
Glucose-Capillary: 171 mg/dL — ABNORMAL HIGH (ref 65–99)
Glucose-Capillary: 199 mg/dL — ABNORMAL HIGH (ref 65–99)
Glucose-Capillary: 103 mg/dL — ABNORMAL HIGH (ref 65–99)
Glucose-Capillary: 146 mg/dL — ABNORMAL HIGH (ref 65–99)
Glucose-Capillary: 252 mg/dL — ABNORMAL HIGH (ref 65–99)

## 2016-03-30 LAB — ECHOCARDIOGRAM COMPLETE
Height: 69 in
Weight: 2400 oz

## 2016-03-30 LAB — APTT: APTT: 32 s (ref 24–36)

## 2016-03-30 LAB — ETHANOL: Alcohol, Ethyl (B): <5 mg/dL (ref <5)

## 2016-03-30 MED ORDER — MOMETASONE FURO-FORMOTEROL FUM 200-5 MCG/ACT IN AERO
2.0000 | INHALATION_SPRAY | Freq: Two times a day (BID) | RESPIRATORY_TRACT | Status: DC
  Administered 2016-03-30 – 2016-03-31 (×3): 2 via RESPIRATORY_TRACT
  Filled 2016-03-30: qty 8.8

## 2016-03-30 MED ORDER — ENOXAPARIN SODIUM 40 MG/0.4ML ~~LOC~~ SOLN
40.0000 mg | SUBCUTANEOUS | Status: DC
Start: 2016-03-30 — End: 2016-03-31
  Administered 2016-03-30: 21:00:00 40 mg via SUBCUTANEOUS
  Filled 2016-03-30: qty 0.4

## 2016-03-30 MED ORDER — ENOXAPARIN SODIUM 40 MG/0.4ML ~~LOC~~ SOLN: 40.0000 mg | SUBCUTANEOUS | Status: DC

## 2016-03-30 MED ORDER — IPRATROPIUM-ALBUTEROL 0.5-2.5 (3) MG/3ML IN SOLN: 3.0000 mL | RESPIRATORY_TRACT | Status: DC | PRN

## 2016-03-30 MED ORDER — ACETAMINOPHEN 160 MG/5ML PO SOLN: 650.0000 mg | ORAL | Status: DC | PRN

## 2016-03-30 MED ORDER — LISINOPRIL 20 MG PO TABS
40.0000 mg | ORAL_TABLET | Freq: Every day | ORAL | Status: DC
  Administered 2016-03-30: 40 mg via ORAL
  Filled 2016-03-30 (×2): qty 2

## 2016-03-30 MED ORDER — STROKE: EARLY STAGES OF RECOVERY BOOK
Freq: Once | Status: AC
  Administered 2016-03-30: 04:00:00

## 2016-03-30 MED ORDER — METOPROLOL TARTRATE 50 MG PO TABS
100.0000 mg | ORAL_TABLET | Freq: Two times a day (BID) | ORAL | Status: DC
  Administered 2016-03-30 (×2): 100 mg via ORAL
  Filled 2016-03-30 (×3): qty 2

## 2016-03-30 MED ORDER — PREDNISONE 20 MG PO TABS: 20.0000 mg | ORAL_TABLET | Freq: Every day | ORAL | Status: DC

## 2016-03-30 MED ORDER — CEFTRIAXONE SODIUM 1 G IJ SOLR: 1.0000 g | Freq: Once | INTRAMUSCULAR | Status: DC

## 2016-03-30 MED ORDER — INSULIN ASPART 100 UNIT/ML ~~LOC~~ SOLN
0.0000 [IU] | Freq: Three times a day (TID) | SUBCUTANEOUS | Status: DC
  Administered 2016-03-30: 2 [IU] via SUBCUTANEOUS
  Administered 2016-03-30: 1 [IU] via SUBCUTANEOUS
  Administered 2016-03-31: 3 [IU] via SUBCUTANEOUS
  Filled 2016-03-30: qty 3
  Filled 2016-03-30: qty 2
  Filled 2016-03-30: qty 1

## 2016-03-30 MED ORDER — ENOXAPARIN SODIUM 80 MG/0.8ML ~~LOC~~ SOLN
1.0000 mg/kg | Freq: Two times a day (BID) | SUBCUTANEOUS | Status: DC
  Administered 2016-03-30: 70 mg via SUBCUTANEOUS
  Filled 2016-03-30: qty 0.8

## 2016-03-30 MED ORDER — SENNOSIDES-DOCUSATE SODIUM 8.6-50 MG PO TABS: 1.0000 | ORAL_TABLET | Freq: Every evening | ORAL | Status: DC | PRN

## 2016-03-30 MED ORDER — INSULIN ASPART 100 UNIT/ML ~~LOC~~ SOLN
0.0000 [IU] | Freq: Every day | SUBCUTANEOUS | Status: DC
  Administered 2016-03-30: 21:00:00 3 [IU] via SUBCUTANEOUS
  Filled 2016-03-30: qty 3

## 2016-03-30 MED ORDER — CEFTRIAXONE SODIUM-DEXTROSE 1-3.74 GM-% IV SOLR
1.0000 g | INTRAVENOUS | Status: DC
  Administered 2016-03-31: 11:00:00 1 g via INTRAVENOUS
  Filled 2016-03-30: qty 50

## 2016-03-30 MED ORDER — SIMVASTATIN 20 MG PO TABS
20.0000 mg | ORAL_TABLET | Freq: Every day | ORAL | Status: DC
  Administered 2016-03-30: 17:00:00 20 mg via ORAL
  Filled 2016-03-30: qty 1

## 2016-03-30 MED ORDER — ASPIRIN 81 MG PO CHEW
324.0000 mg | CHEWABLE_TABLET | Freq: Once | ORAL | Status: AC
  Administered 2016-03-30: 324 mg via ORAL
  Filled 2016-03-30: qty 4

## 2016-03-30 MED ORDER — AMLODIPINE BESYLATE 10 MG PO TABS
10.0000 mg | ORAL_TABLET | Freq: Every day | ORAL | Status: DC
  Administered 2016-03-30: 09:00:00 10 mg via ORAL
  Filled 2016-03-30 (×2): qty 1

## 2016-03-30 MED ORDER — TERAZOSIN HCL 1 MG PO CAPS
1.0000 mg | ORAL_CAPSULE | Freq: Every day | ORAL | Status: DC
  Administered 2016-03-30: 21:00:00 1 mg via ORAL
  Filled 2016-03-30: qty 1

## 2016-03-30 MED ORDER — ACETAMINOPHEN 650 MG RE SUPP: 650.0000 mg | RECTAL | Status: DC | PRN

## 2016-03-30 MED ORDER — ASPIRIN EC 81 MG PO TBEC
81.0000 mg | DELAYED_RELEASE_TABLET | Freq: Every day | ORAL | Status: DC
  Administered 2016-03-30 – 2016-03-31 (×2): 81 mg via ORAL
  Filled 2016-03-30 (×3): qty 1

## 2016-03-30 MED ORDER — CLOPIDOGREL BISULFATE 75 MG PO TABS
75.0000 mg | ORAL_TABLET | Freq: Every day | ORAL | Status: DC
  Administered 2016-03-30 – 2016-03-31 (×2): 75 mg via ORAL
  Filled 2016-03-30 (×2): qty 1

## 2016-03-30 MED ORDER — NITROGLYCERIN 0.4 MG SL SUBL: 0.4000 mg | SUBLINGUAL_TABLET | SUBLINGUAL | Status: DC | PRN

## 2016-03-30 MED ORDER — CEFTRIAXONE SODIUM-DEXTROSE 1-3.74 GM-% IV SOLR
1.0000 g | Freq: Once | INTRAVENOUS | Status: AC
  Administered 2016-03-30: 1 g via INTRAVENOUS
  Filled 2016-03-30: qty 50

## 2016-03-30 MED ORDER — ACETAMINOPHEN 325 MG PO TABS: 650.0000 mg | ORAL_TABLET | ORAL | Status: DC | PRN

## 2016-03-30 NOTE — ED Notes (Signed)
Pt transported to room 128

## 2016-03-30 NOTE — Consult Note (Signed)
Referring Physician: Tressia Miners    Chief Complaint: Left arm weakness  HPI: Stanley Mclean is an 76 y.o. male with multiple medical problems who reports he was buttoning up his shirt and noted acute onset left arm weakness.  He does not report any sensory symptoms.  There is no neck pain.  With no improvement patient presented for evaluation.  Initial NIHSS of 2.  Date last known well: Date: 03/29/2016 Time last known well: Time: 19:00 tPA Given: No: Outside time window  Past Medical History:  Diagnosis Date  . Anemia   . BPH (benign prostatic hyperplasia)   . CAD (coronary artery disease)    a. s/p multiple prior PCIs b. s/p DES-distal RCA 05/2013  . COPD (chronic obstructive pulmonary disease) ([place])   . GERD (gastroesophageal reflux disease)   . Macular degeneration   . Pure hypercholesterolemia   . Tobacco abuse   . Traumatic amputation of leg(s) (complete) (partial), unilateral, level not specified, without mention of complication   . Type II or unspecified type diabetes mellitus without mention of complication, not stated as uncontrolled   . Unspecified essential hypertension     Past Surgical History:  Procedure Laterality Date  . AMPUTATION  08/26/2011  . APPENDECTOMY    . CARDIAC CATHETERIZATION    . CORONARY ANGIOPLASTY WITH STENT PLACEMENT  09/28/2006   RCA  . CORONARY ANGIOPLASTY WITH STENT PLACEMENT  07/21/2006   Mid LAD  . CORONARY ANGIOPLASTY WITH STENT PLACEMENT  06/06/2013   30% prox LAD at the site of a prior stent, 40% mid LAD at the origin of D1, 40% ostial D1, 30% prox LCx at the site of a prior stent, in the proximal third of the vessel segment, 20% prox RCA, 99% distal RCA s/p DES. EF >55%.    Family History  Problem Relation Age of Onset  . Mental illness Mother    Social History:  reports that he has been smoking Cigarettes.  He has a 50.00 pack-year smoking history. He has never used smokeless tobacco. He reports that he does not drink alcohol or use  drugs.  Allergies: No Known Allergies  Medications:  I have reviewed the patient's current medications. Prior to Admission:  Prescriptions Prior to Admission  Medication Sig Dispense Refill Last Dose  . albuterol (PROVENTIL HFA;VENTOLIN HFA) 108 (90 BASE) MCG/ACT inhaler Inhale 2 puffs into the lungs every 6 (six) hours as needed for wheezing or shortness of breath.   prn at prn  . amLODipine (NORVASC) 10 MG tablet Take 1 tablet (10 mg total) by mouth daily. 90 tablet 3 03/29/2016 at Unknown time  . aspirin 81 MG tablet Take 81 mg by mouth daily.   03/29/2016 at Unknown time  . azithromycin (ZITHROMAX) 250 MG tablet Please take 2 pills the first day, then one a day 6 each 0 03/29/2016 at Unknown time  . budesonide-formoterol (SYMBICORT) 160-4.5 MCG/ACT inhaler Inhale 2 puffs into the lungs 2 (two) times daily.   03/29/2016 at Unknown time  . clopidogrel (PLAVIX) 75 MG tablet Take 1 tablet (75 mg total) by mouth daily. 90 tablet 3 03/29/2016 at Unknown time  . ipratropium (ATROVENT) 0.03 % nasal spray Place 2 sprays into both nostrils daily.    03/29/2016 at Unknown time  . lisinopril (PRINIVIL,ZESTRIL) 40 MG tablet TAKE ONE (1) TABLET EACH DAY 90 tablet 3 03/29/2016 at Unknown time  . metoprolol (LOPRESSOR) 100 MG tablet Take 1 tablet (100 mg total) by mouth 2 (two) times daily. 180 tablet  3 03/29/2016 at 2100  . nitroGLYCERIN (NITROSTAT) 0.4 MG SL tablet Place 1 tablet (0.4 mg total) under the tongue every 5 (five) minutes as needed for chest pain. 25 tablet 0 prn at prn  . simvastatin (ZOCOR) 20 MG tablet TAKE ONE (1) TABLET BY MOUTH EVERY DAY 90 tablet 3 03/29/2016 at Unknown time  . terazosin (HYTRIN) 1 MG capsule Take 1 capsule (1 mg total) by mouth at bedtime. 90 capsule 3 03/29/2016 at Unknown time   Scheduled: . amLODipine  10 mg Oral Daily  . aspirin EC  81 mg Oral Daily  . [START ON 03/31/2016] cefTRIAXone  1 g Intravenous Q24H  . clopidogrel  75 mg Oral Daily  . enoxaparin (LOVENOX)  injection  1 mg/kg Subcutaneous Q12H  . insulin aspart  0-5 Units Subcutaneous QHS  . insulin aspart  0-9 Units Subcutaneous TID WC  . lisinopril  40 mg Oral Daily  . metoprolol  100 mg Oral BID  . mometasone-formoterol  2 puff Inhalation BID  . simvastatin  20 mg Oral q1800  . terazosin  1 mg Oral QHS    ROS: History obtained from the patient  General ROS: negative for - chills, fatigue, fever, night sweats, weight gain or weight loss Psychological ROS: negative for - behavioral disorder, hallucinations, memory difficulties, mood swings or suicidal ideation Ophthalmic ROS: negative for - blurry vision, double vision, eye pain or loss of vision ENT ROS: negative for - epistaxis, nasal discharge, oral lesions, sore throat, tinnitus or vertigo Allergy and Immunology ROS: negative for - hives or itchy/watery eyes Hematological and Lymphatic ROS: negative for - bleeding problems, bruising or swollen lymph nodes Endocrine ROS: negative for - galactorrhea, hair pattern changes, polydipsia/polyuria or temperature intolerance Respiratory ROS: negative for - cough, hemoptysis, shortness of breath or wheezing Cardiovascular ROS: negative for - chest pain, dyspnea on exertion, edema or irregular heartbeat Gastrointestinal ROS: negative for - abdominal pain, diarrhea, hematemesis, nausea/vomiting or stool incontinence Genito-Urinary ROS: negative for - dysuria, hematuria, incontinence or urinary frequency/urgency Musculoskeletal ROS: negative for - joint swelling or muscular weakness Neurological ROS: as noted in HPI Dermatological ROS: negative for rash and skin lesion changes  Physical Examination: Blood pressure (!) 169/75, pulse 66, temperature 97.5 F (36.4 C), temperature source Oral, resp. rate 18, height _0  (1.753 m), weight 68 kg (150 lb), SpO2 95 %.  HEENT-  Normocephalic, no lesions, without obvious abnormality.  Normal external eye and conjunctiva.  Normal TM's bilaterally.  Normal  auditory canals and external ears. Normal external nose, mucus membranes and septum.  Normal pharynx. Cardiovascular- S1, S2 normal, pulses palpable throughout   Lungs- chest clear, no wheezing, rales, normal symmetric air entry Abdomen- soft, non-tender; bowel sounds normal; no masses,  no organomegaly Extremities- right BKA Lymph-no adenopathy palpable Musculoskeletal-no joint tenderness, deformity or swelling Skin-warm and dry, no hyperpigmentation, vitiligo, or suspicious lesions  Neurological Examination Mental Status: Alert, oriented, thought content appropriate.  Speech fluent without evidence of aphasia.  Able to follow 3 step commands without difficulty. Cranial Nerves: II: Discs flat bilaterally; Visual fields grossly normal, pupils equal, round, reactive to light and accommodation III,IV, VI: ptosis not present, extra-ocular motions intact bilaterally V,VII: smile symmetric, facial light touch sensation normal bilaterally VIII: hearing normal bilaterally IX,X: gag reflex present XI: bilateral shoulder shrug XII: midline tongue extension Motor: Right : Upper extremity   5/5    Left:     Upper extremity   4-/5  Lower extremity   5/5  Lower extremity   5-/5 Tone and bulk:normal tone throughout; no atrophy noted Sensory: Pinprick and light touch intact throughout, bilaterally Deep Tendon Reflexes: 2+ and symmetric with absent AJ's bilaterally Plantars: Right: Right BKA   Left: mute Cerebellar: Normal finger-to-nose testing bilaterally Gait: not tested due to BKA    Laboratory Studies:  Basic Metabolic Panel:  Recent Labs Lab 03/30/16 0007  NA 140  K 3.7  CL 101  CO2 29  GLUCOSE 206*  BUN 16  CREATININE 0.87  CALCIUM 9.7    Liver Function Tests:  Recent Labs Lab 03/30/16 0007  AST 30  ALT 15*  ALKPHOS 89  BILITOT 0.6  PROT 7.2  ALBUMIN 4.3   No results for input(s): LIPASE, AMYLASE in the last 168 hours. No results for input(s): AMMONIA in the  last 168 hours.  CBC:  Recent Labs Lab 03/30/16 0007  WBC 7.8  NEUTROABS 5.8  HGB 15.1  HCT 43.4  MCV 90.6  PLT 198    Cardiac Enzymes:  Recent Labs Lab 03/30/16 0007  TROPONINI <0.03    BNP: Invalid input(s): POCBNP  CBG:  Recent Labs Lab 03/30/16 0355 03/30/16 0734 03/30/16 1126  GLUCAP 171* 199* 103*    Microbiology: No results found for this or any previous visit.  Coagulation Studies:  Recent Labs  03/30/16 0007  LABPROT 12.7  INR 0.95    Urinalysis:  Recent Labs Lab 03/30/16 0052  COLORURINE YELLOW*  LABSPEC 1.010  PHURINE 6.0  GLUCOSEU 50*  HGBUR SMALL*  BILIRUBINUR NEGATIVE  KETONESUR NEGATIVE  PROTEINUR 30*  NITRITE POSITIVE*  LEUKOCYTESUR SMALL*    Lipid Panel:    Component Value Date/Time   CHOL 158 03/30/2016 0559   CHOL 166 12/29/2013 1033   CHOL 175 06/01/2013 1133   TRIG 215 (H) 03/30/2016 0559   TRIG 211 (H) 06/01/2013 1133   HDL 48 03/30/2016 0559   HDL 43 12/29/2013 1033   HDL 39 (L) 06/01/2013 1133   CHOLHDL 3.3 03/30/2016 0559   VLDL 43 (H) 03/30/2016 0559   VLDL 42 (H) 06/01/2013 1133   LDLCALC 67 03/30/2016 0559   LDLCALC 69 12/29/2013 1033   LDLCALC 94 06/01/2013 1133    HgbA1C:  Lab Results  Component Value Date   HGBA1C 6.6 01/05/2015    Urine Drug Screen:     Component Value Date/Time   LABOPIA NONE DETECTED 03/30/2016 0052   COCAINSCRNUR NONE DETECTED 03/30/2016 0052   LABBENZ NONE DETECTED 03/30/2016 0052   AMPHETMU NONE DETECTED 03/30/2016 0052   THCU NONE DETECTED 03/30/2016 0052   LABBARB NONE DETECTED 03/30/2016 0052    Alcohol Level:  Recent Labs Lab 03/30/16 0007  ETH <5    Other results: EKG: sinus rhythm at 66 bpm.  Imaging: Ct Head Wo Contrast  Result Date: 03/30/2016 CLINICAL DATA:  Left arm numbness EXAM: CT HEAD WITHOUT CONTRAST TECHNIQUE: Contiguous axial images were obtained from the base of the skull through the vertex without intravenous contrast. COMPARISON:   None. FINDINGS: Brain: No mass lesion, intraparenchymal hemorrhage or extra-axial collection. No evidence of acute cortical infarct. There is periventricular hypoattenuation compatible with chronic microvascular disease. Vascular: No hyperdense vessel or unexpected calcification. Skull: Normal visualized skull base, calvarium and extracranial soft tissues. Sinuses/Orbits: No sinus fluid levels or advanced mucosal thickening. No mastoid effusion. Normal orbits. ASPECTS [facility] Stroke Program Early CT Score) - Ganglionic level infarction (caudate, lentiform nuclei, internal capsule, insula, M1-M3 cortex): 7 - Supraganglionic infarction (M4-M6 cortex): 3 Total score (0-10 with  10 being normal): 10 IMPRESSION: 1. No acute intracranial abnormality. 2. ASPECTS is 10. These results were called by telephone at the time of interpretation on 03/30/2016 at 12:20 am to Dr. Marjean Donna , who verbally acknowledged these results. Electronically Signed   By: Ulyses Jarred M.D.   On: 03/30/2016 00:42    Assessment: 76 y.o. male presenting with left arm weakness.  Head CT reviewed and shows no acute changes.  Due to multiple vascular risk factors acute infarct is on the differential.  Patient on ASA and Plavix at home.  Carotid dopplers pending.  Echocardiogram shows no cardiac source of emboli with an EF of 55-60%.  A1c 6.6 from October, LDL 67.  Stroke Risk Factors - hyperlipidemia, hypertension and smoking  Plan: 1. Smoking cessation counseling 2. MRI, MRA  of the brain without contrast 3. PT consult, OT consult, Speech consult 4. Carotid dopplers 5. Prophylactic therapy-Continue ASA and Plavix 6. NPO until RN stroke swallow screen 7. Telemetry monitoring 8. Frequent neuro checks   Alexis Goodell, MD Neurology [phone] 03/30/2016, 12:03 PM

## 2016-03-30 NOTE — H&P (Addendum)
ICHOLAS Mclean is an 76 y.o. male.   Chief Complaint: Numbness HPI: The patient with past medical history significant for coronary artery disease status post PCI as well as diet-controlled diabetes presents to emergency department complaining of numbness in his left arm. He denies chest pain, shortness of breath or palpitations. The patient states his symptoms began approximately 8 hours prior to admission. He denies difficulty swallowing or speaking but admits to decreased grip strength on the left and the inability to touch his nose with his left hand or lift his left arm higher than 45. CT of the brain was negative for acute abnormality but due to the patient's persistent symptoms emergency department staff called the hospitalist service for admission.  Past Medical History:  Diagnosis Date  . Anemia   . BPH (benign prostatic hyperplasia)   . CAD (coronary artery disease)    a. s/p multiple prior PCIs b. s/p DES-distal RCA 05/2013  . COPD (chronic obstructive pulmonary disease) ([place])   . GERD (gastroesophageal reflux disease)   . Macular degeneration   . Pure hypercholesterolemia   . Tobacco abuse   . Traumatic amputation of leg(s) (complete) (partial), unilateral, level not specified, without mention of complication   . Type II or unspecified type diabetes mellitus without mention of complication, not stated as uncontrolled   . Unspecified essential hypertension     Past Surgical History:  Procedure Laterality Date  . AMPUTATION  08/26/2011  . APPENDECTOMY    . CARDIAC CATHETERIZATION    . CORONARY ANGIOPLASTY WITH STENT PLACEMENT  09/28/2006   RCA  . CORONARY ANGIOPLASTY WITH STENT PLACEMENT  07/21/2006   Mid LAD  . CORONARY ANGIOPLASTY WITH STENT PLACEMENT  06/06/2013   30% prox LAD at the site of a prior stent, 40% mid LAD at the origin of D1, 40% ostial D1, 30% prox LCx at the site of a prior stent, in the proximal third of the vessel segment, 20% prox RCA, 99% distal RCA s/p DES.  EF >55%.    Family History  Problem Relation Age of Onset  . Mental illness Mother    Social History:  reports that he has been smoking Cigarettes.  He has a 50.00 pack-year smoking history. He has never used smokeless tobacco. He reports that he does not drink alcohol or use drugs.  Allergies: No Known Allergies  Medications Prior to Admission  Medication Sig Dispense Refill  . albuterol (PROVENTIL HFA;VENTOLIN HFA) 108 (90 BASE) MCG/ACT inhaler Inhale 2 puffs into the lungs every 6 (six) hours as needed for wheezing or shortness of breath.    Marland Kitchen amLODipine (NORVASC) 10 MG tablet Take 1 tablet (10 mg total) by mouth daily. 90 tablet 3  . aspirin 81 MG tablet Take 81 mg by mouth daily.    Marland Kitchen azithromycin (ZITHROMAX) 250 MG tablet Please take 2 pills the first day, then one a day 6 each 0  . budesonide-formoterol (SYMBICORT) 160-4.5 MCG/ACT inhaler Inhale 2 puffs into the lungs 2 (two) times daily.    . clopidogrel (PLAVIX) 75 MG tablet Take 1 tablet (75 mg total) by mouth daily. 90 tablet 3  . ipratropium (ATROVENT) 0.03 % nasal spray Place 2 sprays into both nostrils daily.     Marland Kitchen lisinopril (PRINIVIL,ZESTRIL) 40 MG tablet TAKE ONE (1) TABLET EACH DAY 90 tablet 3  . metoprolol (LOPRESSOR) 100 MG tablet Take 1 tablet (100 mg total) by mouth 2 (two) times daily. 180 tablet 3  . nitroGLYCERIN (NITROSTAT) 0.4 MG SL  tablet Place 1 tablet (0.4 mg total) under the tongue every 5 (five) minutes as needed for chest pain. 25 tablet 0  . simvastatin (ZOCOR) 20 MG tablet TAKE ONE (1) TABLET BY MOUTH EVERY DAY 90 tablet 3  . terazosin (HYTRIN) 1 MG capsule Take 1 capsule (1 mg total) by mouth at bedtime. 90 capsule 3    Results for orders placed or performed during the hospital encounter of 03/29/16 (from the past 48 hour(s))  Ethanol     Status: None   Collection Time: 03/30/16 12:07 AM  Result Value Ref Range   Alcohol, Ethyl (B) <5 <5 mg/dL    Comment:        LOWEST DETECTABLE LIMIT FOR SERUM  ALCOHOL IS 5 mg/dL FOR MEDICAL PURPOSES ONLY   Protime-INR     Status: None   Collection Time: 03/30/16 12:07 AM  Result Value Ref Range   Prothrombin Time 12.7 11.4 - 15.2 seconds   INR 0.95   APTT     Status: None   Collection Time: 03/30/16 12:07 AM  Result Value Ref Range   aPTT 32 24 - 36 seconds  Comprehensive metabolic panel     Status: Abnormal   Collection Time: 03/30/16 12:07 AM  Result Value Ref Range   Sodium 140 135 - 145 mmol/L   Potassium 3.7 3.5 - 5.1 mmol/L   Chloride 101 101 - 111 mmol/L   CO2 29 22 - 32 mmol/L   Glucose, Bld 206 (H) 65 - 99 mg/dL   BUN 16 6 - 20 mg/dL   Creatinine, Ser 0.87 0.61 - 1.24 mg/dL   Calcium 9.7 8.9 - 10.3 mg/dL   Total Protein 7.2 6.5 - 8.1 g/dL   Albumin 4.3 3.5 - 5.0 g/dL   AST 30 15 - 41 U/L   ALT 15 (L) 17 - 63 U/L   Alkaline Phosphatase 89 38 - 126 U/L   Total Bilirubin 0.6 0.3 - 1.2 mg/dL   GFR calc non Af Amer >60 >60 mL/min   GFR calc Af Amer >60 >60 mL/min    Comment: (NOTE) The eGFR has been calculated using the CKD EPI equation. This calculation has not been validated in all clinical situations. eGFR's persistently <60 mL/min signify possible Chronic Kidney Disease.    Anion gap 10 5 - 15  CBC WITH DIFFERENTIAL     Status: None   Collection Time: 03/30/16 12:07 AM  Result Value Ref Range   WBC 7.8 3.8 - 10.6 K/uL   RBC 4.79 4.40 - 5.90 MIL/uL   Hemoglobin 15.1 13.0 - 18.0 g/dL   HCT 43.4 40.0 - 52.0 %   MCV 90.6 80.0 - 100.0 fL   MCH 31.5 26.0 - 34.0 pg   MCHC 34.8 32.0 - 36.0 g/dL   RDW 13.4 11.5 - 14.5 %   Platelets 198 150 - 440 K/uL   Neutrophils Relative % 74 %   Neutro Abs 5.8 1.4 - 6.5 K/uL   Lymphocytes Relative 14 %   Lymphs Abs 1.1 1.0 - 3.6 K/uL   Monocytes Relative 8 %   Monocytes Absolute 0.6 0.2 - 1.0 K/uL   Eosinophils Relative 3 %   Eosinophils Absolute 0.2 0 - 0.7 K/uL   Basophils Relative 1 %   Basophils Absolute 0.0 0 - 0.1 K/uL  Troponin I     Status: None   Collection Time:  03/30/16 12:07 AM  Result Value Ref Range   Troponin I <0.03 <0.03 ng/mL  Urinalysis,  Routine w reflex microscopic     Status: Abnormal   Collection Time: 03/30/16 12:52 AM  Result Value Ref Range   Color, Urine YELLOW (A) YELLOW   APPearance HAZY (A) CLEAR   Specific Gravity, Urine 1.010 1.005 - 1.030   pH 6.0 5.0 - 8.0   Glucose, UA 50 (A) NEGATIVE mg/dL   Hgb urine dipstick SMALL (A) NEGATIVE   Bilirubin Urine NEGATIVE NEGATIVE   Ketones, ur NEGATIVE NEGATIVE mg/dL   Protein, ur 30 (A) NEGATIVE mg/dL   Nitrite POSITIVE (A) NEGATIVE   Leukocytes, UA SMALL (A) NEGATIVE   RBC / HPF 0-5 0 - 5 RBC/hpf   WBC, UA 6-30 0 - 5 WBC/hpf   Bacteria, UA MANY (A) NONE SEEN   Squamous Epithelial / LPF NONE SEEN NONE SEEN   Mucous PRESENT   Urine Drug Screen, Qualitative ([facility] only)     Status: None   Collection Time: 03/30/16 12:52 AM  Result Value Ref Range   Tricyclic, Ur Screen NONE DETECTED NONE DETECTED   Amphetamines, Ur Screen NONE DETECTED NONE DETECTED   MDMA (Ecstasy)Ur Screen NONE DETECTED NONE DETECTED   Cocaine Metabolite,Ur [place] NONE DETECTED NONE DETECTED   Opiate, Ur Screen NONE DETECTED NONE DETECTED   Phencyclidine (PCP) Ur S NONE DETECTED NONE DETECTED   Cannabinoid 50 Ng, Ur [place] NONE DETECTED NONE DETECTED   Barbiturates, Ur Screen NONE DETECTED NONE DETECTED   Benzodiazepine, Ur Scrn NONE DETECTED NONE DETECTED   Methadone Scn, Ur NONE DETECTED NONE DETECTED    Comment: (NOTE) 027  Tricyclics, urine               Cutoff 1000 ng/mL 200  Amphetamines, urine             Cutoff 1000 ng/mL 300  MDMA (Ecstasy), urine           Cutoff 500 ng/mL 400  Cocaine Metabolite, urine       Cutoff 300 ng/mL 500  Opiate, urine                   Cutoff 300 ng/mL 600  Phencyclidine (PCP), urine      Cutoff 25 ng/mL 700  Cannabinoid, urine              Cutoff 50 ng/mL 800  Barbiturates, urine             Cutoff 200 ng/mL 900  Benzodiazepine, urine           Cutoff 200 ng/mL 1000  Methadone, urine                Cutoff 300 ng/mL 1100 1200 The urine drug screen provides only a preliminary, unconfirmed 1300 analytical test result and should not be used for non-medical 1400 purposes. Clinical consideration and professional judgment should 1500 be applied to any positive drug screen result due to possible 1600 interfering substances. A more specific alternate chemical method 1700 must be used in order to obtain a confirmed analytical result.  1800 Gas chromato graphy / mass spectrometry (GC/MS) is the preferred 1900 confirmatory method.   Glucose, capillary     Status: Abnormal   Collection Time: 03/30/16  3:55 AM  Result Value Ref Range   Glucose-Capillary 171 (H) 65 - 99 mg/dL  Lipid panel     Status: Abnormal   Collection Time: 03/30/16  5:59 AM  Result Value Ref Range   Cholesterol 158 0 - 200 mg/dL   Triglycerides 215 (H) <150  mg/dL   HDL 48 >40 mg/dL   Total CHOL/HDL Ratio 3.3 RATIO   VLDL 43 (H) 0 - 40 mg/dL   LDL Cholesterol 67 0 - 99 mg/dL    Comment:        Total Cholesterol/HDL:CHD Risk Coronary Heart Disease Risk Table                     Men   Women  1/2 Average Risk   3.4   3.3  Average Risk       5.0   4.4  2 X Average Risk   9.6   7.1  3 X Average Risk  23.4   11.0        Use the calculated Patient Ratio above and the CHD Risk Table to determine the patient's CHD Risk.        ATP III CLASSIFICATION (LDL):  <100     mg/dL   Optimal  100-129  mg/dL   Near or Above                    Optimal  130-159  mg/dL   Borderline  160-189  mg/dL   High  >190     mg/dL   Very High    Ct Head Wo Contrast  Result Date: 03/30/2016 CLINICAL DATA:  Left arm numbness EXAM: CT HEAD WITHOUT CONTRAST TECHNIQUE: Contiguous axial images were obtained from the base of the skull through the vertex without intravenous contrast. COMPARISON:  None. FINDINGS: Brain: No mass lesion, intraparenchymal hemorrhage or extra-axial collection. No evidence of acute  cortical infarct. There is periventricular hypoattenuation compatible with chronic microvascular disease. Vascular: No hyperdense vessel or unexpected calcification. Skull: Normal visualized skull base, calvarium and extracranial soft tissues. Sinuses/Orbits: No sinus fluid levels or advanced mucosal thickening. No mastoid effusion. Normal orbits. ASPECTS [facility] Stroke Program Early CT Score) - Ganglionic level infarction (caudate, lentiform nuclei, internal capsule, insula, M1-M3 cortex): 7 - Supraganglionic infarction (M4-M6 cortex): 3 Total score (0-10 with 10 being normal): 10 IMPRESSION: 1. No acute intracranial abnormality. 2. ASPECTS is 10. These results were called by telephone at the time of interpretation on 03/30/2016 at 12:20 am to Dr. Marjean Donna , who verbally acknowledged these results. Electronically Signed   By: Ulyses Jarred M.D.   On: 03/30/2016 00:42    Review of Systems  Constitutional: Negative for chills and fever.  HENT: Negative for sore throat and tinnitus.   Eyes: Negative for blurred vision and redness.  Respiratory: Negative for cough and shortness of breath.   Cardiovascular: Negative for chest pain, palpitations, orthopnea and PND.  Gastrointestinal: Negative for abdominal pain, diarrhea, nausea and vomiting.  Genitourinary: Negative for dysuria, frequency and urgency.  Musculoskeletal: Negative for joint pain and myalgias.  Skin: Negative for rash.       No lesions  Neurological: Positive for sensory change and focal weakness. Negative for speech change and weakness.  Endo/Heme/Allergies: Does not bruise/bleed easily.       No temperature intolerance  Psychiatric/Behavioral: Negative for depression and suicidal ideas.    Blood pressure (!) 138/54, pulse 65, temperature 97.5 F (36.4 C), temperature source Oral, resp. rate 18, height _0  (1.753 m), weight 68 kg (150 lb), SpO2 94 %. Physical Exam  Constitutional: He is oriented to person, place, and time. He  appears well-developed and well-nourished. No distress.  HENT:  Head: Normocephalic and atraumatic.  Mouth/Throat: Oropharynx is clear and moist.  Eyes: Conjunctivae and  EOM are normal. Pupils are equal, round, and reactive to light. No scleral icterus.  Neck: Normal range of motion. Neck supple. No JVD present. No tracheal deviation present. No thyromegaly present.  Cardiovascular: Normal rate, regular rhythm and normal heart sounds.  Exam reveals no gallop and no friction rub.   No murmur heard. Respiratory: Effort normal and breath sounds normal. No respiratory distress.  GI: Soft. Bowel sounds are normal. He exhibits no distension. There is no tenderness.  Genitourinary:  Genitourinary Comments: Deferred  Musculoskeletal: Normal range of motion. He exhibits deformity (right lower extremity prosthesis).  Weakened grip strength on left as well as weakened flexion of left upper extremity; dysmetria and poor rapid alternating movement with left upper extremity.  Also decreased extensor strength of left upper extremity. Left lower extremity unaffected  Lymphadenopathy:    He has no cervical adenopathy.  Neurological: He is alert and oriented to person, place, and time. A cranial nerve deficit (sublte right side facial asymmetry) is present.  Skin: Skin is warm and dry. No rash noted. No erythema.  Psychiatric: He has a normal mood and affect. His behavior is normal. Judgment and thought content normal.     Assessment/Plan This is a 76 year old male admitted for CVA. 1. CVA: Anterior and posterior circulation. Thus I have placed the patient on therapeutic Lovenox. Permissice hypertension. MRI of the brain scheduled for tomorrow. Ultrasound of carotids and echocardiogram ordered. Continue aspirin. 2. Coronary artery disease: Stable. Continue Plavix 3. Hypertension: Continue amlodipine, lisinopril and metoprolol 4. Diabetes mellitus type 2: The patient had been taking metformin but this was  discontinued by his primary care doctor. Check hemoglobin A1c. Sliding scale insulin while hospitalized. 5. Hyperlipidemia: Continue statin therapy 6. COPD: Continue inhaled corticosteroid. DuoNeb's as needed 7. BPH: Continue terazosin 8. DVT prophylaxis: Therapeutic anticoagulation 8. GI prophylaxis: None The patient is a full code. Time spent on admission orders and patient care approximately 45 minutes   Harrie Foreman, MD 03/30/2016, 6:59 AM

## 2016-03-30 NOTE — Progress Notes (Addendum)
Physical Therapy Evaluation Patient Details Name: Stanley Mclean MRN: [id number] DOB: 1941-03-14 Today's Date: 03/30/2016   History of Present Illness  The patient with past medical history significant for coronary artery disease status post PCI as well as diet-controlled diabetes presents to emergency department complaining of numbness in his left arm.  Clinical Impression  This patient has PMHx of right BK amputee and now presents with numbness in his left arm. He is able to perform bed mobility with supervision , transfers with min assist and ambulate 30 feet with RW and min assit with slow gait speed and decreased O2 saturation 85%. Patient is independent with putting on prosthesis.Patient will benefit from skilled PT to ambulate with SPC on level surfaces and steps   Follow Up Recommendations SNF    Equipment Recommendations  Cane    Recommendations for Other Services       Precautions / Restrictions Precautions Required Braces or Orthoses: Other Brace/Splint Other Brace/Splint: prosthesis RLE BK      Mobility  Bed Mobility Overal bed mobility: Modified Independent                Transfers Overall transfer level: Needs assistance                  Ambulation/Gait Ambulation/Gait assistance: Min assist   Assistive device: Rolling walker (2 wheeled) Gait Pattern/deviations: Step-to pattern  30 feet with RW        Stairs            Wheelchair Mobility    Modified Rankin (Stroke Patients Only)       Balance Overall balance assessment: Modified Independent                                           Pertinent Vitals/Pain      Home Living Family/patient expects to be discharged to:: Private residence Living Arrangements: Spouse/significant other     Home Access: Stairs to enter Entrance Stairs-Rails: Psychiatric nurse of Steps: 2   Home Equipment: Walker - standard;Wheelchair - manual      Prior  Function Level of Independence: Independent with assistive device(s)               Hand Dominance        Extremity/Trunk Assessment   Upper Extremity Assessment LUE Deficits / Details:  (-3/5 strength shoulder, 3/5 elbow, poor L grip strength ) LUE Sensation:  (no sensation LUE) LUE Coordination:  (poor coordination LUE)            Communication   Communication: No difficulties  Cognition Arousal/Alertness: Awake/alert                          General Comments      Exercises     Assessment/Plan    PT Assessment Patient needs continued PT services (Need of O2 due to 85% O2 saturation with gait and sitting)  PT Problem List            PT Treatment Interventions Gait training;Therapeutic activities;Therapeutic exercise;Balance training    PT Goals (Current goals can be found in the Care Plan section)  Acute Rehab PT Goals Patient Stated Goal:  (to go home) PT Goal Formulation: With patient Time For Goal Achievement: 04/13/16    Frequency 7X/week   Barriers to discharge  Co-evaluation               End of Session Equipment Utilized During Treatment: Gait belt;Other (comment) Activity Tolerance: Other (comment) Patient left: in bed;with call bell/phone within reach;with bed alarm set Nurse Communication: Other (comment)    Functional Limitation: Mobility: Walking and moving around Mobility: Walking and Moving Around Current Status (T4656): At least 60 percent but less than 80 percent impaired, limited or restricted Mobility: Walking and Moving Around Goal Status [phone]): At least 40 percent but less than 60 percent impaired, limited or restricted    Time:  -      Charges:         PT G Codes:   PT G-Codes **NOT FOR INPATIENT CLASS** Functional Limitation: Mobility: Walking and moving around Mobility: Walking and Moving Around Current Status (T7001): At least 60 percent but less than 80 percent impaired, limited or  restricted Mobility: Walking and Moving Around Goal Status [phone]): At least 40 percent but less than 60 percent impaired, limited or restricted    Arelia Sneddon S 03/30/2016, 3:00 PM

## 2016-03-30 NOTE — ED Triage Notes (Signed)
Pt. Reports that around 7 pm this evening, he was trying to button shirt and was unable.

## 2016-03-30 NOTE — ED Provider Notes (Signed)
[facility] Emergency Department Provider Note    First MD Initiated Contact with Patient 03/29/16 2355     (approximate)  I have reviewed the triage vital signs and the nursing notes.   HISTORY  Chief Complaint Numbness    HPI Stanley Mclean is a 76 y.o. male with globus chronic medical conditions presents to the emergency department acute onset of right hand weakness/numbness with onset at 7 PM tonight. Patient denies any other symptoms. Patient denies any headache no visual changes. Patient denies any gait instability nausea or vomiting.   Past Medical History:  Diagnosis Date  . Anemia   . BPH (benign prostatic hyperplasia)   . CAD (coronary artery disease)    a. s/p multiple prior PCIs b. s/p DES-distal RCA 05/2013  . COPD (chronic obstructive pulmonary disease) ([place])   . GERD (gastroesophageal reflux disease)   . Macular degeneration   . Pure hypercholesterolemia   . Tobacco abuse   . Traumatic amputation of leg(s) (complete) (partial), unilateral, level not specified, without mention of complication   . Type II or unspecified type diabetes mellitus without mention of complication, not stated as uncontrolled   . Unspecified essential hypertension     Patient Active Problem List   Diagnosis Date Noted  . COPD (chronic obstructive pulmonary disease) ([place]) 03/27/2015  . Asthmatic bronchitis 01/05/2015  . Type 2 diabetes mellitus without complication ([place]) 62/05/5595  . Sensation of cold in leg 06/19/2014  . Essential hypertension 06/22/2013  . Chest pain 06/01/2013  . Coronary atherosclerosis of native coronary artery 06/01/2013  . SOB (shortness of breath) 06/01/2013  . Smoker 06/01/2013  . S/P coronary artery stent placement 06/01/2013  . Diabetes type 2, controlled ([place]) 06/01/2013  . Angina pectoris ([place]) 06/01/2013  . Amputation of leg ([place]) 06/01/2013    Past Surgical History:  Procedure Laterality Date  . AMPUTATION  08/26/2011   . APPENDECTOMY    . CARDIAC CATHETERIZATION    . CORONARY ANGIOPLASTY WITH STENT PLACEMENT  09/28/2006   RCA  . CORONARY ANGIOPLASTY WITH STENT PLACEMENT  07/21/2006   Mid LAD  . CORONARY ANGIOPLASTY WITH STENT PLACEMENT  06/06/2013   30% prox LAD at the site of a prior stent, 40% mid LAD at the origin of D1, 40% ostial D1, 30% prox LCx at the site of a prior stent, in the proximal third of the vessel segment, 20% prox RCA, 99% distal RCA s/p DES. EF >55%.    Prior to Admission medications   Medication Sig Start Date End Date Taking? Authorizing Provider  albuterol (PROVENTIL HFA;VENTOLIN HFA) 108 (90 BASE) MCG/ACT inhaler Inhale 2 puffs into the lungs every 6 (six) hours as needed for wheezing or shortness of breath.    Historical Provider, MD  amLODipine (NORVASC) 10 MG tablet Take 1 tablet (10 mg total) by mouth daily. 01/15/16 01/14/17  Minna Merritts, MD  aspirin 81 MG tablet Take 81 mg by mouth daily.    Historical Provider, MD  azithromycin (ZITHROMAX) 250 MG tablet Please take 2 pills the first day, then one a day 03/27/15   Minna Merritts, MD  budesonide-formoterol (SYMBICORT) 160-4.5 MCG/ACT inhaler Inhale 2 puffs into the lungs 2 (two) times daily.    Historical Provider, MD  clopidogrel (PLAVIX) 75 MG tablet Take 1 tablet (75 mg total) by mouth daily. 05/01/15   Minna Merritts, MD  FLUZONE HIGH-DOSE 0.5 ML SUSY TO BE ADMINISTERED BY PHARMACIST FOR IMMUNIZATION 12/14/14   Historical Provider,  MD  ipratropium (ATROVENT) 0.03 % nasal spray Place 2 sprays into both nostrils daily.     Historical Provider, MD  lisinopril (PRINIVIL,ZESTRIL) 40 MG tablet TAKE ONE (1) TABLET EACH DAY 01/15/16   Minna Merritts, MD  metoprolol (LOPRESSOR) 100 MG tablet Take 1 tablet (100 mg total) by mouth 2 (two) times daily. 07/03/15   Minna Merritts, MD  nitroGLYCERIN (NITROSTAT) 0.4 MG SL tablet Place 1 tablet (0.4 mg total) under the tongue every 5 (five) minutes as needed for chest pain. 09/03/15    Minna Merritts, MD  predniSONE (DELTASONE) 20 MG tablet Take 1 tablet (20 mg total) by mouth daily with breakfast. 03/27/15   Minna Merritts, MD  simvastatin (ZOCOR) 20 MG tablet TAKE ONE (1) TABLET BY MOUTH EVERY DAY 09/03/15   Minna Merritts, MD  terazosin (HYTRIN) 1 MG capsule Take 1 capsule (1 mg total) by mouth at bedtime. 01/15/16   Minna Merritts, MD    Allergies Patient has no known allergies.  Family History  Problem Relation Age of Onset  . Mental illness Mother     Social History Social History  Substance Use Topics  . Smoking status: Current Every Day Smoker    Packs/day: 1.00    Years: 50.00    Types: Cigarettes  . Smokeless tobacco: Never Used  . Alcohol use No    Review of Systems Constitutional: No fever/chills Eyes: No visual changes. ENT: No sore throat. Cardiovascular: Denies chest pain. Respiratory: Denies shortness of breath. Gastrointestinal: No abdominal pain.  No nausea, no vomiting.  No diarrhea.  No constipation. Genitourinary: Negative for dysuria. Musculoskeletal: Negative for back pain. Skin: Negative for rash. Neurological: Positive for left arm weakness   10-point ROS otherwise negative.  ____________________________________________   PHYSICAL EXAM:  VITAL SIGNS: ED Triage Vitals [03/30/16 0019]  Enc Vitals Group     BP (!) 176/80     Pulse Rate 82     Resp 18     Temp 97.9 F (36.6 C)     Temp Source Oral     SpO2 97 %     Weight      Height      Head Circumference      Peak Flow      Pain Score      Pain Loc      Pain Edu?      Excl. in [place]?     Constitutional: Alert and oriented. Well appearing and in no acute distress. Eyes: Conjunctivae are normal. PERRL. EOMI. Head: Atraumatic. Ears:  Healthy appearing ear canals and TMs bilaterally Nose: No congestion/rhinnorhea. Mouth/Throat: Mucous membranes are moist.  Oropharynx non-erythematous. Neck: No stridor.   Cardiovascular: Normal rate, regular rhythm. Good  peripheral circulation. Grossly normal heart sounds. Respiratory: Normal respiratory effort.  No retractions. Lungs CTAB. Gastrointestinal: Soft and nontender. No distention.  Musculoskeletal: No lower extremity tenderness nor edema. No gross deformities of extremities. Neurologic:  Normal speech and language. No gross focal neurologic deficits are appreciated.  Skin:  Skin is warm, dry and intact. No rash noted. Psychiatric: Mood and affect are normal. Speech and behavior are normal.  ____________________________________________   LABS (all labs ordered are listed, but only abnormal results are displayed)  Labs Reviewed  COMPREHENSIVE METABOLIC PANEL - Abnormal; Notable for the following:       Result Value   Glucose, Bld 206 (*)    ALT 15 (*)    All other components within normal limits  URINALYSIS, ROUTINE W REFLEX MICROSCOPIC - Abnormal; Notable for the following:    Color, Urine YELLOW (*)    APPearance HAZY (*)    Glucose, UA 50 (*)    Hgb urine dipstick SMALL (*)    Protein, ur 30 (*)    Nitrite POSITIVE (*)    Leukocytes, UA SMALL (*)    Bacteria, UA MANY (*)    All other components within normal limits  ETHANOL  PROTIME-INR  APTT  CBC WITH DIFFERENTIAL/PLATELET  URINE DRUG SCREEN, QUALITATIVE ([facility] ONLY)  TROPONIN I  CBC  DIFFERENTIAL   ____________________________________________  EKG  ED ECG REPORT I, [place] N BROWN, the attending physician, personally viewed and interpreted this ECG.   Date: 03/30/2016  EKG Time: 12:18 AM  Rate: 66  Rhythm: Normal sinus rhythm  Axis: Normal  Intervals: Normal  ST&T Change: None  ____________________________________________  RADIOLOGY I, [place] N BROWN, personally viewed and evaluated these images (plain radiographs) as part of my medical decision making, as well as reviewing the written report by the radiologist.  Ct Head Wo Contrast  Result Date: 03/30/2016 CLINICAL DATA:  Left arm numbness EXAM: CT HEAD  WITHOUT CONTRAST TECHNIQUE: Contiguous axial images were obtained from the base of the skull through the vertex without intravenous contrast. COMPARISON:  None. FINDINGS: Brain: No mass lesion, intraparenchymal hemorrhage or extra-axial collection. No evidence of acute cortical infarct. There is periventricular hypoattenuation compatible with chronic microvascular disease. Vascular: No hyperdense vessel or unexpected calcification. Skull: Normal visualized skull base, calvarium and extracranial soft tissues. Sinuses/Orbits: No sinus fluid levels or advanced mucosal thickening. No mastoid effusion. Normal orbits. ASPECTS [facility] Stroke Program Early CT Score) - Ganglionic level infarction (caudate, lentiform nuclei, internal capsule, insula, M1-M3 cortex): 7 - Supraganglionic infarction (M4-M6 cortex): 3 Total score (0-10 with 10 being normal): 10 IMPRESSION: 1. No acute intracranial abnormality. 2. ASPECTS is 10. These results were called by telephone at the time of interpretation on 03/30/2016 at 12:20 am to Dr. Marjean Donna , who verbally acknowledged these results. Electronically Signed   By: Ulyses Jarred M.D.   On: 03/30/2016 00:42     Procedures   Critical Care performed:CRITICAL CARE Performed by: Gregor Hams   Total critical care time: 30 minutes  Critical care time was exclusive of separately billable procedures and treating other patients.  Critical care was necessary to treat or prevent imminent or life-threatening deterioration.  Critical care was time spent personally by me on the following activities: development of treatment plan with patient and/or surrogate as well as nursing, discussions with consultants, evaluation of patient's response to treatment, examination of patient, obtaining history from patient or surrogate, ordering and performing treatments and interventions, ordering and review of laboratory studies, ordering and review of radiographic studies, pulse oximetry  and re-evaluation of patient's condition. ____________________________________________   INITIAL IMPRESSION / ASSESSMENT AND PLAN / ED COURSE  Pertinent labs & imaging results that were available during my care of the patient were reviewed by me and considered in my medical decision making (see chart for details).  History of physical exam concerning for possible CVA as such, stroke initiated. CT head negative. Patient discussed with on-call associate neurology who stated patient does not meet criteria for TPA at this time given symptoms and duration of symptoms.   Clinical Course     ____________________________________________  FINAL CLINICAL IMPRESSION(S) / ED DIAGNOSES  Final diagnoses:  Cerebrovascular accident (CVA), unspecified mechanism ([place])     MEDICATIONS GIVEN DURING THIS VISIT:  Medications  cefTRIAXone (ROCEPHIN) 1 g in dextrose 5 % 50 mL IVPB (not administered)  aspirin chewable tablet 324 mg (324 mg Oral Given 03/30/16 0044)     NEW OUTPATIENT MEDICATIONS STARTED DURING THIS VISIT:  New Prescriptions   No medications on file    Modified Medications   No medications on file    Discontinued Medications   No medications on file     Note:  This document was prepared using Dragon voice recognition software and may include unintentional dictation errors.    Gregor Hams, MD 03/30/16 [phone]

## 2016-03-30 NOTE — ED Notes (Signed)
First Nurse- Patient with complaint of left arm numbness. Patient states that he has had intermittent numbness times two weeks. Patient states that the symptoms became worse about 3 hours ago. Patient denies numbness to face or other extremities.

## 2016-03-30 NOTE — Progress Notes (Signed)
Stanley Mclean responded to a Code Stroke in room ED 09. Pt was alert. Wife was bedside. [place] located Daughter in the waiting area and escorted her to the room. Pt and family were in a good space. CH is available for follow up as needed.    03/30/16 0026  Clinical Encounter Type  Visited With Patient;Patient and family together;Health care provider  Visit Type Initial;Spiritual support;Code;ED  Referral From Nurse  Spiritual Encounters  Spiritual Needs Prayer;Emotional  Advance Directives (For Healthcare)  Does Patient Have a Medical Advance Directive? No

## 2016-03-30 NOTE — Progress Notes (Signed)
[place] [facility] [place] NAME: Stanley Mclean    MR#:  [id number]  DATE OF BIRTH:  Aug 02, 1940  SUBJECTIVE:  CHIEF COMPLAINT:   Chief Complaint  Patient presents with  . Numbness   - Admitted with left arm weakness and right facial droop. Symptoms slightly improved. -Stroke workup is pending  REVIEW OF SYSTEMS:  Review of Systems  Constitutional: Negative for chills, fever and malaise/fatigue.  HENT: Negative for congestion, ear discharge, hearing loss and nosebleeds.   Eyes: Negative for blurred vision.  Respiratory: Negative for cough, shortness of breath and wheezing.   Cardiovascular: Negative for chest pain, palpitations and leg swelling.  Gastrointestinal: Negative for abdominal pain, constipation, diarrhea, nausea and vomiting.  Genitourinary: Negative for dysuria.  Musculoskeletal: Negative for myalgias.  Neurological: Positive for focal weakness. Negative for dizziness, sensory change, speech change, seizures and headaches.  Psychiatric/Behavioral: Negative for depression.    DRUG ALLERGIES:  No Known Allergies  VITALS:  Blood pressure (!) 169/75, pulse 66, temperature 97.5 F (36.4 C), temperature source Oral, resp. rate 18, height _0  (1.753 m), weight 68 kg (150 lb), SpO2 95 %.  PHYSICAL EXAMINATION:  Physical Exam  GENERAL:  76 y.o.-year-old patient lying in the bed with no acute distress.  EYES: Pupils equal, round, reactive to light and accommodation. No scleral icterus. Extraocular muscles intact.  HEENT: Head atraumatic, normocephalic. Oropharynx and nasopharynx clear. Mild right facial droop noted NECK:  Supple, no jugular venous distention. No thyroid enlargement, no tenderness.  LUNGS: Normal breath sounds bilaterally, no wheezing, rales,rhonchi or crepitation. No use of accessory muscles of respiration.  CARDIOVASCULAR: S1, S2 normal. No murmurs, rubs, or gallops.  ABDOMEN: Soft, nontender, nondistended. Bowel  sounds present. No organomegaly or mass.  EXTREMITIES: No pedal edema, cyanosis, or clubbing. Status post right BKA NEUROLOGIC: Cranial nerves II through XII are intact. Right facial droop noted. Muscle strength 5/5 in all extremities except left upper extremity strength is 4/5. Sensation intact. Gait not checked.  PSYCHIATRIC: The patient is alert and oriented x 3.  SKIN: No obvious rash, lesion, or ulcer.    LABORATORY PANEL:   CBC  Recent Labs Lab 03/30/16 0007  WBC 7.8  HGB 15.1  HCT 43.4  PLT 198   ------------------------------------------------------------------------------------------------------------------  Chemistries   Recent Labs Lab 03/30/16 0007  NA 140  K 3.7  CL 101  CO2 29  GLUCOSE 206*  BUN 16  CREATININE 0.87  CALCIUM 9.7  AST 30  ALT 15*  ALKPHOS 89  BILITOT 0.6   ------------------------------------------------------------------------------------------------------------------  Cardiac Enzymes  Recent Labs Lab 03/30/16 0007  TROPONINI <0.03   ------------------------------------------------------------------------------------------------------------------  RADIOLOGY:  Ct Head Wo Contrast  Result Date: 03/30/2016 CLINICAL DATA:  Left arm numbness EXAM: CT HEAD WITHOUT CONTRAST TECHNIQUE: Contiguous axial images were obtained from the base of the skull through the vertex without intravenous contrast. COMPARISON:  None. FINDINGS: Brain: No mass lesion, intraparenchymal hemorrhage or extra-axial collection. No evidence of acute cortical infarct. There is periventricular hypoattenuation compatible with chronic microvascular disease. Vascular: No hyperdense vessel or unexpected calcification. Skull: Normal visualized skull base, calvarium and extracranial soft tissues. Sinuses/Orbits: No sinus fluid levels or advanced mucosal thickening. No mastoid effusion. Normal orbits. ASPECTS [facility] Stroke Program Early CT Score) - Ganglionic level infarction  (caudate, lentiform nuclei, internal capsule, insula, M1-M3 cortex): 7 - Supraganglionic infarction (M4-M6 cortex): 3 Total score (0-10 with 10 being normal): 10 IMPRESSION: 1. No acute intracranial abnormality. 2. ASPECTS is 10.  These results were called by telephone at the time of interpretation on 03/30/2016 at 12:20 am to Dr. Marjean Donna , who verbally acknowledged these results. Electronically Signed   By: Ulyses Jarred M.D.   On: 03/30/2016 00:42    EKG:   Orders placed or performed during the hospital encounter of 03/29/16  . ED EKG  . ED EKG  . EKG 12-Lead  . EKG 12-Lead    ASSESSMENT AND PLAN:   76 year old male with past medical history significant for coronary artery disease status post stents, COPD not on home oxygen, GERD, BPH, diabetes mellitus borderline, hypertension presents to the hospital secondary to left arm weakness that started last night.  #1 left arm weakness-possible acute CVA -CT of the head is negative. Patient does have risk factors. -Echocardiogram, Dopplers, MRI of the brain and MRA are pending. -Already on aspirin, Plavix and statin prior to her condition and continue -blood pressure control - neurology consulted - PT consult, OT consult speech therapy consult is pending  #2 CAD-continue aspirin, Plavix. Last stent 2 years ago.  #3 hypertension-on Norvasc, and Toprol and lisinopril  #4 diabetes mellitus- Borderline Diabetic. A1c is pending. On sliding scale insulin. Hold metformin for now  #5 acute cystitis-urine cultures. Started on Rocephin  #6 COPD- stable. On 2 L oxygen here not on home oxygen. Continue inhalers. Wean as tolerated. No indication for steroids  #7 DVT prophylaxis-on Lovenox-changed to prophylaxis dose   All the records are reviewed and case discussed with Care Management/Social Workerr. Management plans discussed with the patient, family and they are in agreement.  CODE STATUS: Full code  TOTAL TIME TAKING CARE OF THIS  PATIENT: 38 minutes.   POSSIBLE D/C IN 2 DAYS, DEPENDING ON CLINICAL CONDITION.   Gladstone Lighter M.D on 03/30/2016 at 12:29 PM  Between 7am to 6pm - Pager - [phone]  After 6pm go to [email or web address] - password [email or web address] [facility]  Office  [phone]  CC: Primary care physician; [name], MD

## 2016-03-30 NOTE — Progress Notes (Signed)
Pharmacy Antibiotic Note  Stanley Mclean is a 76 y.o. male admitted on 03/29/2016 with UTI.  Pharmacy has been consulted for ceftriaxone dosing. Pt received ceftriaxone 1g IV x 1 this morning  Plan: Begin ceftriaxone 1g IV q 24 hours starting 1/15 am   Height: _0  (175.3 cm) Weight: 150 lb (68 kg) IBW/kg (Calculated) : 70.7  Temp (24hrs), Avg:97.8 F (36.6 C), Min:97.5 F (36.4 C), Max:97.9 F (36.6 C)   Recent Labs Lab 03/30/16 0007  WBC 7.8  CREATININE 0.87    Estimated Creatinine Clearance: 72.7 mL/min (by C-G formula based on SCr of 0.87 mg/dL).    No Known Allergies  Antimicrobials this admission: ceftriaxone 11/14 >>   Dose adjustments this admission:  Microbiology results: 1/14 UCx: sent   Thank you for allowing pharmacy to be a part of this patient's care.  Darrow Bussing, PharmD Pharmacy Resident 03/30/2016 10:31 AM

## 2016-03-30 NOTE — Evaluation (Signed)
Physical Therapy Evaluation Patient Details Name: Stanley Mclean MRN: [id number] DOB: [birth year] Today's Date: 03/30/2016   History of Present Illness  The patient with past medical history significant for coronary artery disease status post PCI as well as diet-controlled diabetes presents to emergency department complaining of numbness in his left arm.  Clinical Impression  This patient has PMHx of right BK amputee and now presents with numbness in his left arm. He is able to perform bed mobility with supervision , transfers with min assist and ambulate 30 feet with RW and min assit with slow gait speed and decreased O2 saturation 85%. Patient is independent with putting on prosthesis.Patient will benefit from skilled PT to ambulate with SPC on level surfaces and steps.    Follow Up Recommendations SNF    Equipment Recommendations  Cane    Recommendations for Other Services       Precautions / Restrictions Precautions Required Braces or Orthoses: Other Brace/Splint Other Brace/Splint: prosthesis RLE BK      Mobility  Bed Mobility Overal bed mobility: Modified Independent                Transfers Overall transfer level: Needs assistance                  Ambulation/Gait Ambulation/Gait assistance: Min assist   Assistive device: Rolling walker (2 wheeled) Gait Pattern/deviations: Step-to pattern        Stairs Stairs:  (NT due to decreased O2 saturation 85%)          Wheelchair Mobility    Modified Rankin (Stroke Patients Only)       Balance Overall balance assessment: Modified Independent                                           Pertinent Vitals/Pain Pain Assessment: No/denies pain    Home Living Family/patient expects to be discharged to:: Private residence Living Arrangements: Spouse/significant other     Home Access: Stairs to enter Entrance Stairs-Rails: Psychiatric nurse of Steps: 2   Home  Equipment: Walker - standard;Wheelchair - manual      Prior Function Level of Independence: Independent with assistive device(s)               Hand Dominance        Extremity/Trunk Assessment   Upper Extremity Assessment Upper Extremity Assessment: LUE deficits/detail LUE Deficits / Details:  (-3/5 strength shoulder, 3/5 elbow, poor L grip strength ) LUE Sensation:  (no sensation LUE) LUE Coordination:  (poor coordination LUE)    Lower Extremity Assessment Lower Extremity Assessment: Overall WFL for tasks assessed       Communication   Communication: No difficulties  Cognition Arousal/Alertness: Awake/alert                          General Comments      Exercises     Assessment/Plan    PT Assessment  (Need of O2 due to 85% O2 saturation with gait and sitting)  PT Problem List            PT Treatment Interventions      PT Goals (Current goals can be found in the Care Plan section)  Acute Rehab PT Goals Patient Stated Goal:  (to go home) PT Goal Formulation: With patient Time For Goal Achievement: 04/13/16  Frequency     Barriers to discharge        Co-evaluation               End of Session Equipment Utilized During Treatment: Gait belt;Other (comment) Activity Tolerance: Other (comment) Patient left: in bed;with call bell/phone within reach;with bed alarm set Nurse Communication: Other (comment)    Functional Limitation: Mobility: Walking and moving around Mobility: Walking and Moving Around Current Status (E4235): At least 60 percent but less than 80 percent impaired, limited or restricted Mobility: Walking and Moving Around Goal Status [phone]): At least 40 percent but less than 60 percent impaired, limited or restricted    Time: 0930-0955 PT Time Calculation (min) (ACUTE ONLY): 25 min   Charges:   PT Evaluation $PT Eval Low Complexity: 1 Procedure PT Treatments $Gait Training: 8-22 mins   PT G Codes:   PT  G-Codes **NOT FOR INPATIENT CLASS** Functional Limitation: Mobility: Walking and moving around Mobility: Walking and Moving Around Current Status (R1540): At least 60 percent but less than 80 percent impaired, limited or restricted Mobility: Walking and Moving Around Goal Status [phone]): At least 40 percent but less than 60 percent impaired, limited or restricted  Alanson Puls, PT, DPT  [place], [place] S 03/30/2016, 11:13 AM

## 2016-03-30 NOTE — Progress Notes (Signed)
*  PRELIMINARY RESULTS* Echocardiogram 2D Echocardiogram has been performed.  Stanley Mclean 03/30/2016, 8:32 AM

## 2016-03-31 ENCOUNTER — Inpatient Hospital Stay: Payer: PPO

## 2016-03-31 LAB — GLUCOSE, CAPILLARY
Glucose-Capillary: 115 mg/dL — ABNORMAL HIGH (ref 65–99)
Glucose-Capillary: 218 mg/dL — ABNORMAL HIGH (ref 65–99)

## 2016-03-31 LAB — HEMOGLOBIN A1C
Hgb A1c MFr Bld: 7.4 % — ABNORMAL HIGH (ref 4.8–5.6)
Mean Plasma Glucose: 166 mg/dL

## 2016-03-31 LAB — BASIC METABOLIC PANEL
Anion gap: 9 (ref 5–15)
BUN: 13 mg/dL (ref 6–20)
CHLORIDE: 104 mmol/L (ref 101–111)
CO2: 32 mmol/L (ref 22–32)
Calcium: 9.4 mg/dL (ref 8.9–10.3)
Creatinine, Ser: 1.01 mg/dL (ref 0.61–1.24)
GFR calc Af Amer: >60 mL/min (ref >60)
GFR calc non Af Amer: >60 mL/min (ref >60)
GLUCOSE: 102 mg/dL — AB (ref 65–99)
POTASSIUM: 3.6 mmol/L (ref 3.5–5.1)
Sodium: 145 mmol/L (ref 135–145)

## 2016-03-31 MED ORDER — LISINOPRIL 20 MG PO TABS: ORAL_TABLET | ORAL | 2 refills | Status: DC

## 2016-03-31 MED ORDER — ATORVASTATIN CALCIUM 20 MG PO TABS: 40.0000 mg | ORAL_TABLET | Freq: Every day | ORAL | Status: DC

## 2016-03-31 MED ORDER — METOPROLOL TARTRATE 50 MG PO TABS
50.0000 mg | ORAL_TABLET | Freq: Two times a day (BID) | ORAL | Status: DC
  Administered 2016-03-31: 12:00:00 50 mg via ORAL
  Filled 2016-03-31: qty 1

## 2016-03-31 MED ORDER — METOPROLOL TARTRATE 50 MG PO TABS
50.0000 mg | ORAL_TABLET | Freq: Two times a day (BID) | ORAL | 2 refills | Status: DC
Start: 2016-03-31 — End: 2016-10-02

## 2016-03-31 MED ORDER — ATORVASTATIN CALCIUM 40 MG PO TABS: 40.0000 mg | ORAL_TABLET | Freq: Every day | ORAL | 2 refills | Status: DC

## 2016-03-31 MED ORDER — IOPAMIDOL (ISOVUE-370) INJECTION 76%
75.0000 mL | Freq: Once | INTRAVENOUS | Status: AC | PRN
  Administered 2016-03-31: 10:00:00 75 mL via INTRAVENOUS

## 2016-03-31 MED ORDER — CEPHALEXIN 250 MG PO CAPS: 250.0000 mg | ORAL_CAPSULE | Freq: Three times a day (TID) | ORAL | 0 refills | Status: DC

## 2016-03-31 NOTE — Clinical Social Work Note (Signed)
Clinical Social Work Assessment  Patient Details  Name: Stanley Mclean MRN: [id number] Date of Birth: Apr 17, 1940  Date of referral:  03/31/16               Reason for consult:  Facility Placement                Permission sought to share information with:    Permission granted to share information::     Name::        Agency::     Relationship::     Contact Information:     Housing/Transportation Living arrangements for the past 2 months:  Single Family Home Source of Information:  Patient Patient Interpreter Needed:  None Criminal Activity/Legal Involvement Pertinent to Current Situation/Hospitalization:  No - Comment as needed Significant Relationships:  Spouse Lives with:  Spouse Do you feel safe going back to the place where you live?  Yes Need for family participation in patient care:  Yes (Comment)  Care giving concerns:  Patient lives in [place] with his wife Stanley Mclean.    Social Worker assessment / plan:  Holiday representative (CSW) received SNF consult. Last PT on 03/30/16 recommends SNF. MD has discharged patient today. CSW met with patient alone at bedside to address consult. Patient was alert and oriented and was sitting up independently on the side of the bed eating lunch. CSW introduced self and explained role of CSW department. Per patient he lives in [place] with his wife and he plans on going home today. CSW explained that PT recommended SNF. Patient reported that PT has changed their recommendation to home health today. CSW discussed SNF option. Patient refused SNF and reported that he is going home with home health. RN case manager aware of above. Please reconsult if future social work needs arise. CSW signing off.   Employment status:  Retired Nurse, adult PT Recommendations:  [place] / Referral to community resources:  Other (Comment Required) (Patient refused SNF and reported that he is going home today.  )  Patient/Family's Response to care:  Patient refused SNF and reported that he is going home.   Patient/Family's Understanding of and Emotional Response to Diagnosis, Current Treatment, and Prognosis:  Patient was very pleasant and thanked CSW for visit.   Emotional Assessment Appearance:  Appears stated age Attitude/Demeanor/Rapport:    Affect (typically observed):  Accepting, Adaptable, Pleasant Orientation:  Oriented to Self, Oriented to Place, Oriented to Situation, Oriented to  Time Alcohol / Substance use:  Not Applicable Psych involvement (Current and /or in the community):  No (Comment)  Discharge Needs  Concerns to be addressed:  Discharge Planning Concerns Readmission within the last 30 days:  No Current discharge risk:  None Barriers to Discharge:  No Barriers Identified   Stanley Mclean, Stanley Beets, LCSW 03/31/2016, 1:16 PM

## 2016-03-31 NOTE — NC FL2 (Signed)
[place] LEVEL OF CARE SCREENING TOOL     IDENTIFICATION  Patient Name: Stanley Mclean Birthdate: 1940/10/12 Sex: male Admission Date (Current Location): 03/29/2016  [place] and [place] Number:  Engineering geologist and Address:  [facility], [address], [place], [place] [address]      Provider Number: [id number]  Attending Physician Name and Address:  Gladstone Lighter, MD  Relative Name and Phone Number:       Current Level of Care: Hospital Recommended Level of Care: [place] Prior Approval Number:    Date Approved/Denied:   PASRR Number:  [id number] )  Discharge Plan: SNF    Current Diagnoses: Patient Active Problem List   Diagnosis Date Noted  . CVA (cerebral vascular accident) ([place]) 03/30/2016  . COPD (chronic obstructive pulmonary disease) ([place]) 03/27/2015  . Asthmatic bronchitis 01/05/2015  . Type 2 diabetes mellitus without complication ([place]) 32/67/1245  . Sensation of cold in leg 06/19/2014  . Essential hypertension 06/22/2013  . Chest pain 06/01/2013  . Coronary atherosclerosis of native coronary artery 06/01/2013  . SOB (shortness of breath) 06/01/2013  . Smoker 06/01/2013  . S/P coronary artery stent placement 06/01/2013  . Diabetes type 2, controlled ([place]) 06/01/2013  . Angina pectoris ([place]) 06/01/2013  . Amputation of leg ([place]) 06/01/2013    Orientation RESPIRATION BLADDER Height & Weight     Self, Time, Situation, Place  Normal Continent Weight: 150 lb (68 kg) Height:  _0  (175.3 cm)  BEHAVIORAL SYMPTOMS/MOOD NEUROLOGICAL BOWEL NUTRITION STATUS   (none)  (none) Continent Diet (Diet: Heart Healthy/ CarB Modified )  AMBULATORY STATUS COMMUNICATION OF NEEDS Skin   Extensive Assist Verbally Normal                       Personal Care Assistance Level of Assistance  Bathing, Feeding, Dressing Bathing Assistance: Limited assistance Feeding assistance: Independent Dressing  Assistance: Limited assistance     Functional Limitations Info  Sight, Hearing, Speech Sight Info: Adequate Hearing Info: Adequate Speech Info: Adequate    SPECIAL CARE FACTORS FREQUENCY  PT (By licensed PT), OT (By licensed OT)     PT Frequency:  (5) OT Frequency:  (5)            Contractures      Additional Factors Info  Code Status, Allergies, Insulin Sliding Scale Code Status Info:  (Full Code. ) Allergies Info:  (No Known Allergies. )   Insulin Sliding Scale Info:  (NovoLog Insulin Injections. )       Current Medications (03/31/2016):  This is the current hospital active medication list Current Facility-Administered Medications  Medication Dose Route Frequency Provider Last Rate Last Dose  . acetaminophen (TYLENOL) tablet 650 mg  650 mg Oral Q4H PRN Harrie Foreman, MD       Or  . acetaminophen (TYLENOL) solution 650 mg  650 mg Per Tube Q4H PRN Harrie Foreman, MD       Or  . acetaminophen (TYLENOL) suppository 650 mg  650 mg Rectal Q4H PRN Harrie Foreman, MD      . aspirin EC tablet 81 mg  81 mg Oral Daily Harrie Foreman, MD   81 mg at 03/31/16 1059  . atorvastatin (LIPITOR) tablet 40 mg  40 mg Oral q1800 Gladstone Lighter, MD      . cefTRIAXone (ROCEPHIN) IVPB 1 g  1 g Intravenous Q24H Merilyn Baba, RPH   1 g at 03/31/16  1118  . clopidogrel (PLAVIX) tablet 75 mg  75 mg Oral Daily Harrie Foreman, MD   75 mg at 03/31/16 1059  . enoxaparin (LOVENOX) injection 40 mg  40 mg Subcutaneous Q24H Gladstone Lighter, MD   40 mg at 03/30/16 2114  . insulin aspart (novoLOG) injection 0-5 Units  0-5 Units Subcutaneous QHS Harrie Foreman, MD   3 Units at 03/30/16 2126  . insulin aspart (novoLOG) injection 0-9 Units  0-9 Units Subcutaneous TID WC Harrie Foreman, MD   3 Units at 03/31/16 1249  . ipratropium-albuterol (DUONEB) 0.5-2.5 (3) MG/3ML nebulizer solution 3 mL  3 mL Nebulization Q4H PRN Harrie Foreman, MD      . metoprolol (LOPRESSOR) tablet 50 mg   50 mg Oral BID Gladstone Lighter, MD   50 mg at 03/31/16 1140  . mometasone-formoterol (DULERA) 200-5 MCG/ACT inhaler 2 puff  2 puff Inhalation BID Harrie Foreman, MD   2 puff at 03/31/16 1058  . nitroGLYCERIN (NITROSTAT) SL tablet 0.4 mg  0.4 mg Sublingual Q5 min PRN Harrie Foreman, MD      . senna-docusate (Senokot-S) tablet 1 tablet  1 tablet Oral QHS PRN Harrie Foreman, MD      . terazosin (HYTRIN) capsule 1 mg  1 mg Oral QHS Harrie Foreman, MD   1 mg at 03/30/16 2114     Discharge Medications: Please see discharge summary for a list of discharge medications.  Relevant Imaging Results:  Relevant Lab Results:   Additional Information  (SSN: [id number])  [place], Veronia Beets, LCSW

## 2016-03-31 NOTE — Discharge Summary (Signed)
[place] [facility] [place] NAME: Stanley Mclean    MR#:  [id number]  DATE OF BIRTH:  [birth year]  DATE OF ADMISSION:  03/29/2016   ADMITTING PHYSICIAN: Harrie Foreman, MD  DATE OF DISCHARGE: 03/31/2016  4:25 PM  PRIMARY CARE PHYSICIAN: [name], MD   ADMISSION DIAGNOSIS:   Cerebrovascular accident (CVA), unspecified mechanism ([place]) [I63.9]  DISCHARGE DIAGNOSIS:   Active Problems:   CVA (cerebral vascular accident) ([place])   SECONDARY DIAGNOSIS:   Past Medical History:  Diagnosis Date  . Anemia   . BPH (benign prostatic hyperplasia)   . CAD (coronary artery disease)    a. s/p multiple prior PCIs b. s/p DES-distal RCA 05/2013  . COPD (chronic obstructive pulmonary disease) ([place])   . GERD (gastroesophageal reflux disease)   . Macular degeneration   . Pure hypercholesterolemia   . Tobacco abuse   . Traumatic amputation of leg(s) (complete) (partial), unilateral, level not specified, without mention of complication   . Type II or unspecified type diabetes mellitus without mention of complication, not stated as uncontrolled   . Unspecified essential hypertension     HOSPITAL COURSE:   76 year old male with past medical history significant for coronary artery disease status post stents, COPD not on home oxygen, GERD, BPH, diabetes mellitus borderline, hypertension presents to the hospital secondary to left arm weakness that started last night.  #1 left arm weakness-acute CVA- [place] I of the brain showing tiny right sided acute infarct. -MRA showing significant right-sided ICA stenosis. 2 CT angiogram was ordered and it showed 60% carotid stenosis. -Vascular follow up as outpatient. On aspirin and Plavix. Strongly encouraged to stop smoking -Patient does have risk factors. -Echocardiogram normal EF and no cardiac source of emboli noted.,  -blood pressure control - neurology consult appreciated - PT consult, OT consult speech  therapy consults appreciated  #2 CAD-continue aspirin, Plavix. Last stent 2 years ago.  #3 Hypertension-Blood pressure has been on the lower side in the hospital. Medications have been adjusted especially with his significant intracranial carotid artery stenosis. Plan is to maintain slightly higher blood pressure. -On metoprolol and decreased dose of lisinopril. Norvasc has been discontinued.  #4 diabetes mellitus- Borderline Diabetic. A1c is 7.4  -Restarted his metformin at discharge  #5 acute cystitis-urine cultures are pending.. Was on Rocephin in the hospital, changed to Keflex at discharge  #6 COPD- stable. On 2 L oxygen here not on home oxygen. Try to qualify for home oxygen, did not qualify. So weaned off oxygen. Continue inhalers.  No indication for steroids -Strongly counseled to stop smoking.  Physical therapy recommended rehabilitation. Patient refused and wanted to go home. Home health has been arranged.  DISCHARGE CONDITIONS:   Guarded  CONSULTS OBTAINED:   Treatment Team:  Alexis Goodell, MD  DRUG ALLERGIES:   No Known Allergies DISCHARGE MEDICATIONS:   Allergies as of 03/31/2016   No Known Allergies     Medication List    STOP taking these medications   azithromycin 250 MG tablet Commonly known as:  ZITHROMAX   simvastatin 20 MG tablet Commonly known as:  ZOCOR     TAKE these medications   albuterol 108 (90 Base) MCG/ACT inhaler Commonly known as:  PROVENTIL HFA;VENTOLIN HFA Inhale 2 puffs into the lungs every 6 (six) hours as needed for wheezing or shortness of breath.   amLODipine 10 MG tablet Commonly known as:  NORVASC Take 1 tablet (10 mg total) by mouth daily.  aspirin 81 MG tablet Take 81 mg by mouth daily.   atorvastatin 40 MG tablet Commonly known as:  LIPITOR Take 1 tablet (40 mg total) by mouth daily.   budesonide-formoterol 160-4.5 MCG/ACT inhaler Commonly known as:  SYMBICORT Inhale 2 puffs into the lungs 2 (two) times  daily.   cephALEXin 250 MG capsule Commonly known as:  KEFLEX Take 1 capsule (250 mg total) by mouth 3 (three) times daily. X 5 more days   clopidogrel 75 MG tablet Commonly known as:  PLAVIX Take 1 tablet (75 mg total) by mouth daily.   ipratropium 0.03 % nasal spray Commonly known as:  ATROVENT Place 2 sprays into both nostrils daily.   lisinopril 20 MG tablet Commonly known as:  PRINIVIL,ZESTRIL TAKE ONE (1) TABLET EACH DAY What changed:  medication strength   metoprolol 50 MG tablet Commonly known as:  LOPRESSOR Take 1 tablet (50 mg total) by mouth 2 (two) times daily. What changed:  medication strength  how much to take   nitroGLYCERIN 0.4 MG SL tablet Commonly known as:  NITROSTAT Place 1 tablet (0.4 mg total) under the tongue every 5 (five) minutes as needed for chest pain.   terazosin 1 MG capsule Commonly known as:  HYTRIN Take 1 capsule (1 mg total) by mouth at bedtime.            Durable Medical Equipment        Start     Ordered   03/31/16 1215  For home use only DME oxygen  Once    Question Answer Comment  Mode or (Route) Nasal cannula   Liters per Minute 2   Frequency Continuous (stationary and portable oxygen unit needed)   Oxygen conserving device Yes   Oxygen delivery system Gas      03/31/16 1214       DISCHARGE INSTRUCTIONS:   1. SMOKING CESSATION ADVISED 2. Neurology f/u in 3 weeks 2. PCP f/u in 1-2 weeks  DIET:   Cardiac diet  ACTIVITY:   Activity as tolerated  OXYGEN:   Home Oxygen: No.  Oxygen Delivery: room air  DISCHARGE LOCATION:   home   If you experience worsening of your admission symptoms, develop shortness of breath, life threatening emergency, suicidal or homicidal thoughts you must seek medical attention immediately by calling 911 or calling your MD immediately  if symptoms less severe.  You Must read complete instructions/literature along with all the possible adverse reactions/side effects for all  the Medicines you take and that have been prescribed to you. Take any new Medicines after you have completely understood and accpet all the possible adverse reactions/side effects.   Please note  You were cared for by a hospitalist during your hospital stay. If you have any questions about your discharge medications or the care you received while you were in the hospital after you are discharged, you can call the unit and asked to speak with the hospitalist on call if the hospitalist that took care of you is not available. Once you are discharged, your primary care physician will handle any further medical issues. Please note that NO REFILLS for any discharge medications will be authorized once you are discharged, as it is imperative that you return to your primary care physician (or establish a relationship with a primary care physician if you do not have one) for your aftercare needs so that they can reassess your need for medications and monitor your lab values.    On the day of  Discharge:  VITAL SIGNS:   Blood pressure (!) 156/62, pulse (!) 58, temperature 98.3 F (36.8 C), temperature source Oral, resp. rate 20, height _0  (1.753 m), weight 68 kg (150 lb), SpO2 92 %.  PHYSICAL EXAMINATION:   GENERAL:  76 y.o.-year-old patient lying in the bed with no acute distress.  EYES: Pupils equal, round, reactive to light and accommodation. No scleral icterus. Extraocular muscles intact.  HEENT: Head atraumatic, normocephalic. Oropharynx and nasopharynx clear. Mild right facial droop noted NECK:  Supple, no jugular venous distention. No thyroid enlargement, no tenderness.  LUNGS: Normal breath sounds bilaterally, no wheezing, rales,rhonchi or crepitation. No use of accessory muscles of respiration.  CARDIOVASCULAR: S1, S2 normal. No murmurs, rubs, or gallops.  ABDOMEN: Soft, nontender, nondistended. Bowel sounds present. No organomegaly or mass.  EXTREMITIES: No pedal edema, cyanosis, or clubbing.  Status post right BKA NEUROLOGIC: Cranial nerves II through XII are intact. Right facial droop noted. Muscle strength 5/5 in all extremities except left upper extremity strength is 4/5. Sensation intact. Gait not checked.  PSYCHIATRIC: The patient is alert and oriented x 3.  SKIN: No obvious rash, lesion, or ulcer.   DATA REVIEW:   CBC  Recent Labs Lab 03/30/16 0007  WBC 7.8  HGB 15.1  HCT 43.4  PLT 198    Chemistries   Recent Labs Lab 03/30/16 0007 03/31/16 0426  NA 140 145  K 3.7 3.6  CL 101 104  CO2 29 32  GLUCOSE 206* 102*  BUN 16 13  CREATININE 0.87 1.01  CALCIUM 9.7 9.4  AST 30  --   ALT 15*  --   ALKPHOS 89  --   BILITOT 0.6  --      Microbiology Results  No results found for this or any previous visit.  RADIOLOGY:  Ct Angio Neck W Or Wo Contrast  Result Date: 03/31/2016 CLINICAL DATA:  76 year old diabetic hypertensive male with left arm weakness and right facial droop. Acute posterior right corona radiata infarct. Subsequent encounter. EXAM: CT ANGIOGRAPHY NECK TECHNIQUE: Multidetector CT imaging of the neck was performed using the standard protocol during bolus administration of intravenous contrast. Multiplanar CT image reconstructions and MIPs were obtained to evaluate the vascular anatomy. Carotid stenosis measurements (when applicable) are obtained utilizing NASCET criteria, using the distal internal carotid diameter as the denominator. CONTRAST:  75 cc of Isovue 370. COMPARISON:  03/30/2016 MR. FINDINGS: Aortic arch: 3 vessel arch with plaque. Right carotid system: Streak artifact proximal right common carotid artery without high-grade stenosis. Plaque mid to distal right common carotid artery without significant narrowing. Plaque right carotid bifurcation with 64% maximal diameter stenosis at the junction of the right common carotid artery and proximal right internal carotid artery. Plaque pre cavernous and cavernous segment right internal carotid artery  with moderate narrowing. Left carotid system: Calcified plaque left carotid bifurcation. Calcified and noncalcified plaque proximal left internal carotid artery. Less than 60% diameter stenosis. Calcified plaque left internal carotid artery cavernous segment mild narrowing. Left subclavian artery/vertebral artery. Calcified and noncalcified plaque proximal 3 cm left subclavian artery with mild to moderate narrowing. Fusiform dilation of the left subclavian artery beyond this region with pleated appearance which may represent atherosclerotic changes and/or fibromuscular dysplasia. Moderate narrowing secondary to a fold of the ectatic left subclavian artery from which left vertebral artery arises. Left vertebral artery is small throughout its course. Right subclavian artery/vertebral artery. Minimal to mild plaque right subclavian artery without significant narrowing. Plaque origin right vertebral artery with mild  narrowing. Right vertebral artery is ectatic throughout its course without high-grade stenosis. Skeleton: Scoliosis cervical and upper thoracic spine with superimposed degenerative changes most prominent C5-6 and C6-7. Other neck: No worrisome primary neck mass. Upper chest: Scarring without worrisome mass. IMPRESSION: Plaque right carotid bifurcation with 64% maximal diameter stenosis at the junction of the right common carotid artery and proximal right internal carotid artery. Plaque pre cavernous and cavernous segment right internal carotid artery with moderate narrowing. Calcified plaque left carotid bifurcation. Calcified and noncalcified plaque proximal left internal carotid artery. Less than 60% diameter stenosis. Calcified plaque left internal carotid artery cavernous segment with mild narrowing. Calcified and noncalcified plaque proximal 3 cm left subclavian artery with mild to moderate narrowing. Fusiform dilation of the left subclavian artery beyond this region with pleated appearance which may  represent atherosclerotic changes and/or fibromuscular dysplasia. Moderate narrowing secondary to a fold of the ectatic left subclavian artery from which left vertebral artery arises. Left vertebral artery is small throughout its course. Minimal to mild plaque right subclavian artery without significant narrowing. Plaque origin right vertebral artery with mild narrowing. Right vertebral artery is ectatic throughout its course without high-grade stenosis. Electronically Signed   By: Genia Del M.D.   On: 03/31/2016 10:35     Management plans discussed with the patient, family and they are in agreement.  CODE STATUS:     Code Status Orders        Start     Ordered   03/30/16 0342  Full code  Continuous     03/30/16 0341    Code Status History    Date Active Date Inactive Code Status Order ID Comments User Context   This patient has a current code status but no historical code status.      TOTAL TIME TAKING CARE OF THIS PATIENT: 37 minutes.    Gladstone Lighter M.D on 03/31/2016 at 5:38 PM  Between 7am to 6pm - Pager - [phone]  After 6pm go to [email or web address] - Proofreader  [facility]  Hospitalists  Office  [phone]  CC: Primary care physician; [name], MD   Note: This dictation was prepared with Dragon dictation along with smaller phrase technology. Any transcriptional errors that result from this process are unintentional.

## 2016-03-31 NOTE — Care Management (Addendum)
Admitted to [facility] with the diagnosis of CVA. Lives with wife, Stanley Mclean [phone]). Last seen Dr. Luan Pulling 6 months ago. Sees Dr. Rockey Situ more frequently. Home Health in the past, couldn't remember name of agency. Skilled Nursing per [facility] 3 years ago. No home oxygen. Standard and rolling walker in the home. Wheelchair in the home. Prescriptions are filled at [facility]. No falls. Good appetite. Takes care of all basic activities of daily living himself, drives.  Physical therapy evaluation completed. Recommends skilled nursing. Declining skilled nursing facility at this time. Discussed home health with wife. Unsure of what the name of the agency they had last time. Would like to call back with that information. Business card given to Mr & Ms Carattini.  Received telephone call from Ms. Lavigne. Would like [facility]. Will fax information to [facility]. Off oxygen. 92% room air at rest. Discharge to home today per Dr. Tressia Miners. Shelbie Ammons RN MSN CCM Care Management

## 2016-03-31 NOTE — Progress Notes (Signed)
SLP Cancellation Note  Patient Details Name: Stanley Mclean MRN: [id number] DOB: 07-19-1940   Cancelled treatment:       Reason Eval/Treat Not Completed: SLP screened, no needs identified, will sign off (chart reviewed; consulted NSG then pt/family member). Met w/ pt(reading the paper) who denied any difficulty w/ swallowing at meals or w/ his communication. Pt conversed w/ SLP and wife easily w/ no overt deficits noted; wife denied deficits as well.  NSG to reconsult if any change in status while admitted. Pt agreed.    Orinda Kenner, MS, CCC-SLP Watson,Katherine 03/31/2016, 4:00 PM

## 2016-03-31 NOTE — Progress Notes (Signed)
Subjective: Patient with some improvement in strength but continued LUE weakness  Objective: Current vital signs: BP 138/60 (BP Location: Left Arm)   Pulse 65   Temp 97.5 F (36.4 C) (Oral)   Resp 18   Ht _0  (1.753 m)   Wt 68 kg (150 lb)   SpO2 (!) 86%   BMI 22.15 kg/m  Vital signs in last 24 hours: Temp:  [97.5 F (36.4 C)] 97.5 F (36.4 C) (01/14 2027) Pulse Rate:  [59-72] 65 (01/15 1054) Resp:  [18] 18 (01/15 1054) BP: (131-150)/(57-68) 138/60 (01/15 1054) SpO2:  [86 %-96 %] 86 % (01/15 1054)  Intake/Output from previous day: 01/14 0701 - 01/15 0700 In: 720 [P.O.:720] Out: 525 [Urine:525] Intake/Output this shift: Total I/O In: 240 [P.O.:240] Out: -  Nutritional status: Diet heart healthy/carb modified Room service appropriate? Yes; Fluid consistency: Thin Diet - low sodium heart healthy  Neurologic Exam: Mental Status: Alert, oriented, thought content appropriate.  Speech fluent without evidence of aphasia.  Able to follow 3 step commands without difficulty. Cranial Nerves: II: Discs flat bilaterally; Visual fields grossly normal, pupils equal, round, reactive to light and accommodation III,IV, VI: ptosis not present, extra-ocular motions intact bilaterally V,VII: smile symmetric, facial light touch sensation normal bilaterally VIII: hearing normal bilaterally IX,X: gag reflex present XI: bilateral shoulder shrug XII: midline tongue extension Motor: Right :  Upper extremity   5/5                                      Left:     Upper extremity   4+/5             Lower extremity   5/5                                                  Lower extremity   5-/5 Tone and bulk:normal tone throughout; no atrophy noted Sensory: Pinprick and light touch intact throughout, bilaterally    Lab Results: Basic Metabolic Panel:  Recent Labs Lab 03/30/16 0007 03/31/16 0426  NA 140 145  K 3.7 3.6  CL 101 104  CO2 29 32  GLUCOSE 206* 102*  BUN 16 13  CREATININE 0.87  1.01  CALCIUM 9.7 9.4    Liver Function Tests:  Recent Labs Lab 03/30/16 0007  AST 30  ALT 15*  ALKPHOS 89  BILITOT 0.6  PROT 7.2  ALBUMIN 4.3   No results for input(s): LIPASE, AMYLASE in the last 168 hours. No results for input(s): AMMONIA in the last 168 hours.  CBC:  Recent Labs Lab 03/30/16 0007  WBC 7.8  NEUTROABS 5.8  HGB 15.1  HCT 43.4  MCV 90.6  PLT 198    Cardiac Enzymes:  Recent Labs Lab 03/30/16 0007  TROPONINI <0.03    Lipid Panel:  Recent Labs Lab 03/30/16 0559  CHOL 158  TRIG 215*  HDL 48  CHOLHDL 3.3  VLDL 43*  LDLCALC 67    CBG:  Recent Labs Lab 03/30/16 1126 03/30/16 1638 03/30/16 2106 03/31/16 0742 03/31/16 1153  GLUCAP 103* 146* 252* 115* 218*    Microbiology: No results found for this or any previous visit.  Coagulation Studies:  Recent Labs  03/30/16 0007  LABPROT 12.7  INR 0.95  Imaging: Dg Eye Foreign Body  Result Date: 03/30/2016 CLINICAL DATA:  Metal working/exposure; clearance prior to MRI EXAM: ORBITS FOR FOREIGN BODY - 2 VIEW COMPARISON:  None. FINDINGS: No radiopaque foreign body.  Ok to proceed to MRI. IMPRESSION: No radiopaque foreign body.  Ok to proceed to MRI. Electronically Signed   By: Sandi Mariscal M.D.   On: 03/30/2016 15:27   Ct Head Wo Contrast  Result Date: 03/30/2016 CLINICAL DATA:  Left arm numbness EXAM: CT HEAD WITHOUT CONTRAST TECHNIQUE: Contiguous axial images were obtained from the base of the skull through the vertex without intravenous contrast. COMPARISON:  None. FINDINGS: Brain: No mass lesion, intraparenchymal hemorrhage or extra-axial collection. No evidence of acute cortical infarct. There is periventricular hypoattenuation compatible with chronic microvascular disease. Vascular: No hyperdense vessel or unexpected calcification. Skull: Normal visualized skull base, calvarium and extracranial soft tissues. Sinuses/Orbits: No sinus fluid levels or advanced mucosal thickening. No  mastoid effusion. Normal orbits. ASPECTS [facility] Stroke Program Early CT Score) - Ganglionic level infarction (caudate, lentiform nuclei, internal capsule, insula, M1-M3 cortex): 7 - Supraganglionic infarction (M4-M6 cortex): 3 Total score (0-10 with 10 being normal): 10 IMPRESSION: 1. No acute intracranial abnormality. 2. ASPECTS is 10. These results were called by telephone at the time of interpretation on 03/30/2016 at 12:20 am to Dr. Marjean Donna , who verbally acknowledged these results. Electronically Signed   By: Ulyses Jarred M.D.   On: 03/30/2016 00:42   Ct Angio Neck W Or Wo Contrast  Result Date: 03/31/2016 CLINICAL DATA:  76 year old diabetic hypertensive male with left arm weakness and right facial droop. Acute posterior right corona radiata infarct. Subsequent encounter. EXAM: CT ANGIOGRAPHY NECK TECHNIQUE: Multidetector CT imaging of the neck was performed using the standard protocol during bolus administration of intravenous contrast. Multiplanar CT image reconstructions and MIPs were obtained to evaluate the vascular anatomy. Carotid stenosis measurements (when applicable) are obtained utilizing NASCET criteria, using the distal internal carotid diameter as the denominator. CONTRAST:  75 cc of Isovue 370. COMPARISON:  03/30/2016 MR. FINDINGS: Aortic arch: 3 vessel arch with plaque. Right carotid system: Streak artifact proximal right common carotid artery without high-grade stenosis. Plaque mid to distal right common carotid artery without significant narrowing. Plaque right carotid bifurcation with 64% maximal diameter stenosis at the junction of the right common carotid artery and proximal right internal carotid artery. Plaque pre cavernous and cavernous segment right internal carotid artery with moderate narrowing. Left carotid system: Calcified plaque left carotid bifurcation. Calcified and noncalcified plaque proximal left internal carotid artery. Less than 60% diameter stenosis.  Calcified plaque left internal carotid artery cavernous segment mild narrowing. Left subclavian artery/vertebral artery. Calcified and noncalcified plaque proximal 3 cm left subclavian artery with mild to moderate narrowing. Fusiform dilation of the left subclavian artery beyond this region with pleated appearance which may represent atherosclerotic changes and/or fibromuscular dysplasia. Moderate narrowing secondary to a fold of the ectatic left subclavian artery from which left vertebral artery arises. Left vertebral artery is small throughout its course. Right subclavian artery/vertebral artery. Minimal to mild plaque right subclavian artery without significant narrowing. Plaque origin right vertebral artery with mild narrowing. Right vertebral artery is ectatic throughout its course without high-grade stenosis. Skeleton: Scoliosis cervical and upper thoracic spine with superimposed degenerative changes most prominent C5-6 and C6-7. Other neck: No worrisome primary neck mass. Upper chest: Scarring without worrisome mass. IMPRESSION: Plaque right carotid bifurcation with 64% maximal diameter stenosis at the junction of the right common carotid artery and  proximal right internal carotid artery. Plaque pre cavernous and cavernous segment right internal carotid artery with moderate narrowing. Calcified plaque left carotid bifurcation. Calcified and noncalcified plaque proximal left internal carotid artery. Less than 60% diameter stenosis. Calcified plaque left internal carotid artery cavernous segment with mild narrowing. Calcified and noncalcified plaque proximal 3 cm left subclavian artery with mild to moderate narrowing. Fusiform dilation of the left subclavian artery beyond this region with pleated appearance which may represent atherosclerotic changes and/or fibromuscular dysplasia. Moderate narrowing secondary to a fold of the ectatic left subclavian artery from which left vertebral artery arises. Left  vertebral artery is small throughout its course. Minimal to mild plaque right subclavian artery without significant narrowing. Plaque origin right vertebral artery with mild narrowing. Right vertebral artery is ectatic throughout its course without high-grade stenosis. Electronically Signed   By: Genia Del M.D.   On: 03/31/2016 10:35   Mr Brain Wo Contrast  Result Date: 03/30/2016 CLINICAL DATA:  LEFT arm numbness and weakness. Symptoms again 8 hours prior to admission. EXAM: MRI HEAD WITHOUT CONTRAST MRA HEAD WITHOUT CONTRAST TECHNIQUE: Multiplanar, multiecho pulse sequences of the brain and surrounding structures were obtained without intravenous contrast. Angiographic images of the head were obtained using MRA technique without contrast. COMPARISON:  CT head earlier today. FINDINGS: MRI HEAD FINDINGS Brain: Acute 1 cm diameter subcortical/periventricular white matter infarct, corona radiata, in the expected location to affect the LEFT upper extremity. No hemorrhage, mass lesion, hydrocephalus, or extra-axial fluid. Moderate atrophy. Moderate T2 and FLAIR hyperintensities throughout the white matter consistent chronic microvascular ischemic change. Scattered areas of chronic lacunar infarction in the white matter. Chronic microvascular ischemic change also affects the brainstem. Vascular: Flow voids are maintained throughout the carotid, basilar, and vertebral arteries. There are no areas of chronic hemorrhage. Skull and upper cervical spine: Normal marrow signal. Sinuses/Orbits: Negative. Other: Compared to recent CT, the infarct is not visible. MRA HEAD FINDINGS High-grade stenosis of the vertical petrous segment RIGHT ICA. No similar lesion on the LEFT. Slight irregularity BILATERAL cavernous and supraclinoid ICA segments without focal stenosis. There is moderate to severe irregularity of the M2 and M3 branches bilaterally, greater on the RIGHT. No definite focal branch occlusion. Basilar artery widely  patent with RIGHT vertebral dominant. Moderate to severely diseased distal LEFT vertebral does not contribute significantly to the posterior circulation. Mild irregularity of both proximal middle cerebral artery segments without focal stenosis. Codominant anterior cerebral arteries. No posterior cerebral artery stenosis. Moderately diseased RIGHT superior cerebellar artery. Moderately diseased RIGHT AICA. PICA vessels poorly seen. No saccular aneurysm. IMPRESSION: Acute 1 cm nonhemorrhagic deep white matter infarct, RIGHT hemisphere. Atrophy and small vessel disease. Apparent high-grade stenosis of the vertical petrous segment RIGHT ICA, likely focal atherosclerosis. Intracranial atherosclerotic disease, most notably affecting the RIGHT greater than LEFT M2 and M3 MCA branches. Electronically Signed   By: Staci Righter M.D.   On: 03/30/2016 16:55   US Carotid Bilateral (at [facility] Only)  Result Date: 03/30/2016 CLINICAL DATA:  Left arm numbness. History of CAD post coronary stent placement. History of hypertension, hyperlipidemia, diabetes and smoking. EXAM: BILATERAL CAROTID DUPLEX ULTRASOUND TECHNIQUE: Pearline Cables scale imaging, color Doppler and duplex ultrasound were performed of bilateral carotid and vertebral arteries in the neck. COMPARISON:  None. FINDINGS: Criteria: Quantification of carotid stenosis is based on velocity parameters that correlate the residual internal carotid diameter with NASCET-based stenosis levels, using the diameter of the distal internal carotid lumen as the denominator for stenosis measurement. The  following velocity measurements were obtained: RIGHT ICA:  115/18 cm/sec CCA:  82/06 cm/sec SYSTOLIC ICA/CCA RATIO:  1.2 DIASTOLIC ICA/CCA RATIO:  1.4 ECA:  164 cm/sec LEFT ICA:  90/13 cm/sec CCA:  015/61 cm/sec SYSTOLIC ICA/CCA RATIO:  0.6 DIASTOLIC ICA/CCA RATIO:  0.8 ECA:  103 cm/sec RIGHT CAROTID ARTERY: There is a moderate amount of eccentric mixed echogenic plaque with the mid  aspect the right common carotid artery (image 9). There is a large amount of eccentric mixed echogenic plaque within the right carotid bulb (image 18), extending to involve the origin and proximal aspect of the right internal carotid artery (image 27), morphologically results in at least 50% luminal narrowing though not resulting in elevated peak systolic velocities within the interrogated course of the right internal carotid artery. RIGHT VERTEBRAL ARTERY:  Antegrade Flow LEFT CAROTID ARTERY: There is a moderate amount of eccentric mixed echogenic partially shadowing plaque within the left carotid bulb (images 52 and 54), extending to involve the origin and proximal aspect the left internal carotid artery (image 62), not resulting in elevated peak systolic velocities within the interrogated course the left internal carotid artery to suggest a hemodynamically significant stenosis. LEFT VERTEBRAL ARTERY:  Antegrade Flow IMPRESSION: 1. Large amount eccentric plaque within the right carotid bulb extending to involve the origin and proximal aspect of the right internal carotid artery morphologically results in at least 50% luminal narrowing though not associated with elevated peak systolic velocities within the right internal carotid artery. Further evaluation with CTA could performed as clinically indicated. 2. Moderate amount of left-sided atherosclerotic plaque, not resulting in a hemodynamically significant stenosis. Electronically Signed   By: Sandi Mariscal M.D.   On: 03/30/2016 15:26   Mr Jodene Nam Head/brain BP Cm  Result Date: 03/30/2016 CLINICAL DATA:  LEFT arm numbness and weakness. Symptoms again 8 hours prior to admission. EXAM: MRI HEAD WITHOUT CONTRAST MRA HEAD WITHOUT CONTRAST TECHNIQUE: Multiplanar, multiecho pulse sequences of the brain and surrounding structures were obtained without intravenous contrast. Angiographic images of the head were obtained using MRA technique without contrast. COMPARISON:  CT  head earlier today. FINDINGS: MRI HEAD FINDINGS Brain: Acute 1 cm diameter subcortical/periventricular white matter infarct, corona radiata, in the expected location to affect the LEFT upper extremity. No hemorrhage, mass lesion, hydrocephalus, or extra-axial fluid. Moderate atrophy. Moderate T2 and FLAIR hyperintensities throughout the white matter consistent chronic microvascular ischemic change. Scattered areas of chronic lacunar infarction in the white matter. Chronic microvascular ischemic change also affects the brainstem. Vascular: Flow voids are maintained throughout the carotid, basilar, and vertebral arteries. There are no areas of chronic hemorrhage. Skull and upper cervical spine: Normal marrow signal. Sinuses/Orbits: Negative. Other: Compared to recent CT, the infarct is not visible. MRA HEAD FINDINGS High-grade stenosis of the vertical petrous segment RIGHT ICA. No similar lesion on the LEFT. Slight irregularity BILATERAL cavernous and supraclinoid ICA segments without focal stenosis. There is moderate to severe irregularity of the M2 and M3 branches bilaterally, greater on the RIGHT. No definite focal branch occlusion. Basilar artery widely patent with RIGHT vertebral dominant. Moderate to severely diseased distal LEFT vertebral does not contribute significantly to the posterior circulation. Mild irregularity of both proximal middle cerebral artery segments without focal stenosis. Codominant anterior cerebral arteries. No posterior cerebral artery stenosis. Moderately diseased RIGHT superior cerebellar artery. Moderately diseased RIGHT AICA. PICA vessels poorly seen. No saccular aneurysm. IMPRESSION: Acute 1 cm nonhemorrhagic deep white matter infarct, RIGHT hemisphere. Atrophy and small vessel disease. Apparent high-grade stenosis of  the vertical petrous segment RIGHT ICA, likely focal atherosclerosis. Intracranial atherosclerotic disease, most notably affecting the RIGHT greater than LEFT M2 and M3  MCA branches. Electronically Signed   By: Staci Righter M.D.   On: 03/30/2016 16:55    Medications:  I have reviewed the patient's current medications. Scheduled: . aspirin EC  81 mg Oral Daily  . atorvastatin  40 mg Oral q1800  . cefTRIAXone  1 g Intravenous Q24H  . clopidogrel  75 mg Oral Daily  . enoxaparin (LOVENOX) injection  40 mg Subcutaneous Q24H  . insulin aspart  0-5 Units Subcutaneous QHS  . insulin aspart  0-9 Units Subcutaneous TID WC  . metoprolol  50 mg Oral BID  . mometasone-formoterol  2 puff Inhalation BID  . terazosin  1 mg Oral QHS    Assessment/Plan: Patient with some mild improvement in strength.  Remains on Plavix and ASA.  MRI of the brain reviewed and shows an acute right hemispheric infarct likely secondary to small vessel disease.  MRA suggested areas of moderate to high grade stenosis.  Carotid dopplers show a large amount of eccentric right carotid bulb plaque.  Echocardiogram shows no cardiac source of emboli with an EF of 55-60%.  A1c 7.4, LDL 67.  CTA of neck performed in follow up shows 64% right carotid bifurcation stenosis.    Recommendations: 1.  Continue ASA, Plavix and statin 2.  Again smoking cessation stressed 3.  Patient to be followed up on an outpatient basis by vascular as well as neurology   LOS: 1 day   Alexis Goodell, MD Neurology [phone] 03/31/2016  12:44 PM

## 2016-03-31 NOTE — Progress Notes (Signed)
OT Cancellation Note  Patient Details Name: Stanley [facility] MRN: [id number] DOB: [birth year]   Cancelled Treatment:    Reason Eval/Treat Not Completed: Patient at procedure or test/ unavailable  Harrel Carina, MS, OTR/L 03/31/2016, 10:17 AM

## 2016-03-31 NOTE — Evaluation (Signed)
Occupational Therapy Evaluation Patient Details Name: Stanley [facility]clean MRN: [id number] DOB: September 07, 1940 Today's Date: 03/31/2016    History of Present Illness The patient with past medical history significant for coronary artery disease status post PCI as well as diet-controlled diabetes presents to emergency department complaining of numbness in his left arm.   Clinical Impression   76yo male pt admitted for CVA presenting with LUE weakness and decreased coordination. Pt will [facility] from [facility] to address noted impairments and associated functional deficits with self care tasks to maximize functional independence with ADL and driving in order to return to PLOF. All OT needs can be addressed at next venue of care. OT will sign off.    Follow Up Recommendations  Home health OT    Equipment Recommendations       Recommendations for Other Services       Precautions / Restrictions Precautions Required Braces or Orthoses: Other Brace/Splint Other Brace/Splint: prosthesis RLE BK Restrictions Weight Bearing Restrictions: No      Mobility Bed Mobility Overal bed mobility: Modified Independent                Transfers                      Balance Overall balance assessment: Modified Independent                                          ADL Overall ADL's : Needs assistance/impaired                                       General ADL Comments: Pt requires supervision for safety and PRN assistance for LB ADL tasks while pt continues to have LUE weakness     Vision Vision Assessment?: Yes Eye Alignment: Within Functional Limits Ocular Range of Motion: Within Functional Limits Alignment/Gaze Preference: Within Defined Limits Tracking/Visual Pursuits: Decreased smoothness of horizontal tracking Convergence: Within functional limits Visual Fields: No apparent deficits Additional Comments: aside from decreased smoothness with  horizontal tracking, vision did not appear to negatively impact pt's safety or functional indep with ADL tasks   Perception     Praxis      Pertinent Vitals/Pain Pain Assessment: No/denies pain     Hand Dominance     Extremity/Trunk Assessment Upper Extremity Assessment Upper Extremity Assessment: LUE deficits/detail LUE Deficits / Details: 3+/5 shoulder, elbow; ROM WFL; poor grip and pinch strength LUE Coordination: decreased fine motor (increased difficulty with bilat coordination while donning sock)   Lower Extremity Assessment Lower Extremity Assessment: Defer to PT evaluation       Communication Communication Communication: No difficulties   Cognition Arousal/Alertness: Awake/alert Behavior During Therapy: WFL for tasks assessed/performed Overall Cognitive Status: Within Functional Limits for tasks assessed                     General Comments       Exercises       Shoulder Instructions      Home Living Family/patient expects to be discharged to:: Private residence Living Arrangements: Spouse/significant other Available Help at Discharge: Family;Available 24 hours/day Type of Home: House Home Access: Stairs to enter [employer] of Steps: 2 Entrance Stairs-Rails: Right;Left Home Layout: One level     Bathroom Shower/Tub: Tub/shower  unit;Walk-in shower   Bathroom Toilet: Standard Bathroom Accessibility: Yes How Accessible: Accessible via wheelchair;Accessible via walker Home Equipment: Walker - standard;Wheelchair - manual;Cane - single point;Grab bars - tub/shower;Grab bars - toilet;Adaptive equipment;Tub bench;Shower Engineer, technical sales Additional Comments: Pt primarily uses walk in shower with shower seat for bathing      Prior Functioning/Environment Level of Independence: Independent        Comments: Pt was indep with all ADL and driving at baseline         OT Problem List: Decreased strength;Decreased  coordination;Impaired UE functional use   OT Treatment/Interventions:      OT Goals(Current goals can be found in the care plan section) Acute Rehab OT Goals Patient Stated Goal: go home OT Goal Formulation: With patient/family Time For Goal Achievement: 04/14/16 Potential to Achieve Goals: Good  OT Frequency:     Barriers to D/C:            Co-evaluation              End of Session    Activity Tolerance: Patient tolerated treatment well Patient left: in bed;with call bell/phone within reach;with family/visitor present   Time: 6579-0383 OT Time Calculation (min): 19 min Charges:  OT General Charges $OT Visit: 1 Procedure OT Evaluation $OT Eval Low Complexity: 1 Procedure OT Treatments $Self Care/Home Management : 8-22 mins G-Codes:    Corky Sox, OTR/L 03/31/2016, 3:17 PM

## 2016-04-02 LAB — URINE CULTURE: Culture: >=100000 — AB

## 2016-04-03 DIAGNOSIS — E785 Hyperlipidemia, unspecified: Secondary | ICD-10-CM | POA: Diagnosis not present

## 2016-04-03 DIAGNOSIS — J449 Chronic obstructive pulmonary disease, unspecified: Secondary | ICD-10-CM | POA: Diagnosis not present

## 2016-04-03 DIAGNOSIS — F17210 Nicotine dependence, cigarettes, uncomplicated: Secondary | ICD-10-CM | POA: Diagnosis not present

## 2016-04-03 DIAGNOSIS — I2510 Atherosclerotic heart disease of native coronary artery without angina pectoris: Secondary | ICD-10-CM | POA: Diagnosis not present

## 2016-04-03 DIAGNOSIS — N400 Enlarged prostate without lower urinary tract symptoms: Secondary | ICD-10-CM | POA: Diagnosis not present

## 2016-04-03 DIAGNOSIS — Z7982 Long term (current) use of aspirin: Secondary | ICD-10-CM | POA: Diagnosis not present

## 2016-04-03 DIAGNOSIS — E119 Type 2 diabetes mellitus without complications: Secondary | ICD-10-CM | POA: Diagnosis not present

## 2016-04-03 DIAGNOSIS — I69334 Monoplegia of upper limb following cerebral infarction affecting left non-dominant side: Secondary | ICD-10-CM | POA: Diagnosis not present

## 2016-04-03 DIAGNOSIS — H3530 Unspecified macular degeneration: Secondary | ICD-10-CM | POA: Diagnosis not present

## 2016-04-03 DIAGNOSIS — D649 Anemia, unspecified: Secondary | ICD-10-CM | POA: Diagnosis not present

## 2016-04-03 DIAGNOSIS — I10 Essential (primary) hypertension: Secondary | ICD-10-CM | POA: Diagnosis not present

## 2016-04-03 DIAGNOSIS — Z7951 Long term (current) use of inhaled steroids: Secondary | ICD-10-CM | POA: Diagnosis not present

## 2016-04-03 DIAGNOSIS — Z89511 Acquired absence of right leg below knee: Secondary | ICD-10-CM | POA: Diagnosis not present

## 2016-04-07 ENCOUNTER — Telehealth: Payer: Self-pay | Admitting: Family Medicine

## 2016-04-07 ENCOUNTER — Other Ambulatory Visit: Payer: Self-pay | Admitting: *Deleted

## 2016-04-07 ENCOUNTER — Encounter: Payer: Self-pay | Admitting: *Deleted

## 2016-04-07 NOTE — Patient Outreach (Signed)
Stanley Mclean) Care Management  04/07/2016  MACY LINGENFELTER 05-Sep-1940 [id number]  Referral via General Discharge for follow-up for Red dashboard on new prescriptions-patient answered don't know.  Per chart review patient had admission from 1/13-1/15/2018 @ [facility] with stroke diagnosis.  Telephone call to patient who was advised of reason for call & of [name] care management services. Patient voices that he does not have any concerns about new prescription and that he has been able to get prescription filled without problems. States he is taking medications as prescribed including ASA & Plavix.  Patient voices that he has follow up MD appointment scheduled for tomorrow and that he has transportation to office. Patient advised that he knows stroke symptoms & is aware of calling 911 if symptoms happen.   Patient declines periodic calls  following hospital stay for Transition of care, disease management/care coordination if needs.  Patient sates he received Stroke educational information & does not need any more materials.   Patient consents to [facility] contact information to use if needed.   Will close case & send [facility] brochure & Magnet to patient.  Sherrin Daisy, RN BSN [facility] Management Coordinator [facility]  [phone]

## 2016-04-07 NOTE — Telephone Encounter (Signed)
Stanley [facility] from [facility] needs a verbal for skilled nursing, PT, and OT to evaluate and treat.  His call back number is [phone]

## 2016-04-07 NOTE — Telephone Encounter (Signed)
A verbal was given.

## 2016-04-08 ENCOUNTER — Ambulatory Visit (INDEPENDENT_AMBULATORY_CARE_PROVIDER_SITE_OTHER): Payer: PPO | Admitting: Family Medicine

## 2016-04-08 ENCOUNTER — Encounter: Payer: Self-pay | Admitting: Family Medicine

## 2016-04-08 VITALS — BP 145/60 | HR 72 | Temp 98.2°F | Resp 16 | Ht 69.0 in | Wt 149.0 lb

## 2016-04-08 DIAGNOSIS — R8290 Unspecified abnormal findings in urine: Secondary | ICD-10-CM

## 2016-04-08 DIAGNOSIS — J432 Centrilobular emphysema: Secondary | ICD-10-CM | POA: Diagnosis not present

## 2016-04-08 DIAGNOSIS — I639 Cerebral infarction, unspecified: Secondary | ICD-10-CM | POA: Diagnosis not present

## 2016-04-08 DIAGNOSIS — E119 Type 2 diabetes mellitus without complications: Secondary | ICD-10-CM

## 2016-04-08 DIAGNOSIS — Z955 Presence of coronary angioplasty implant and graft: Secondary | ICD-10-CM

## 2016-04-08 DIAGNOSIS — Z89619 Acquired absence of unspecified leg above knee: Secondary | ICD-10-CM

## 2016-04-08 DIAGNOSIS — I10 Essential (primary) hypertension: Secondary | ICD-10-CM | POA: Diagnosis not present

## 2016-04-08 DIAGNOSIS — S88919A Complete traumatic amputation of unspecified lower leg, level unspecified, initial encounter: Secondary | ICD-10-CM

## 2016-04-08 MED ORDER — LISINOPRIL 30 MG PO TABS: ORAL_TABLET | ORAL | 6 refills | Status: DC

## 2016-04-08 NOTE — Progress Notes (Signed)
Name: Stanley Mclean   MRN: [id number]    DOB: 1940-11-26   Date:04/08/2016       Progress Note  Subjective  Chief Complaint  Chief Complaint  Patient presents with  . Hospitalization Follow-up  . Urinary Tract Infection  . Cerebrovascular Accident    HPI Here for f/u of CVA and UTI.  L sided CVA on 03/29/16/  Hosp x 2 days.  L sided weakness getting better.  No trouble with speech or mental processes.   No problem with balance   Took antibiotics for UTI.  Has finished these. No problem-specific Assessment & Plan notes found for this encounter.   Past Medical History:  Diagnosis Date  . Anemia   . BPH (benign prostatic hyperplasia)   . CAD (coronary artery disease)    a. s/p multiple prior PCIs b. s/p DES-distal RCA 05/2013  . COPD (chronic obstructive pulmonary disease) ([place])   . GERD (gastroesophageal reflux disease)   . Macular degeneration   . Pure hypercholesterolemia   . Tobacco abuse   . Traumatic amputation of leg(s) (complete) (partial), unilateral, level not specified, without mention of complication   . Type II or unspecified type diabetes mellitus without mention of complication, not stated as uncontrolled   . Unspecified essential hypertension     Past Surgical History:  Procedure Laterality Date  . AMPUTATION  08/26/2011  . APPENDECTOMY    . CARDIAC CATHETERIZATION    . CORONARY ANGIOPLASTY WITH STENT PLACEMENT  09/28/2006   RCA  . CORONARY ANGIOPLASTY WITH STENT PLACEMENT  07/21/2006   Mid LAD  . CORONARY ANGIOPLASTY WITH STENT PLACEMENT  06/06/2013   30% prox LAD at the site of a prior stent, 40% mid LAD at the origin of D1, 40% ostial D1, 30% prox LCx at the site of a prior stent, in the proximal third of the vessel segment, 20% prox RCA, 99% distal RCA s/p DES. EF >55%.    Family History  Problem Relation Age of Onset  . Mental illness Mother     Social History   Social History  . Marital status: Married    Spouse name: N/A  . Number of children:  N/A  . Years of education: N/A   Occupational History  . Not on file.   Social History Main Topics  . Smoking status: Former Smoker    Packs/day: 1.00    Years: 50.00    Types: Cigarettes  . Smokeless tobacco: Never Used  . Alcohol use No  . Drug use: No  . Sexual activity: Not on file   Other Topics Concern  . Not on file   Social History Narrative  . No narrative on file     Current Outpatient Prescriptions:  .  albuterol (PROVENTIL HFA;VENTOLIN HFA) 108 (90 BASE) MCG/ACT inhaler, Inhale 2 puffs into the lungs every 6 (six) hours as needed for wheezing or shortness of breath., Disp: , Rfl:  .  amLODipine (NORVASC) 10 MG tablet, Take 1 tablet (10 mg total) by mouth daily., Disp: 90 tablet, Rfl: 3 .  aspirin 81 MG tablet, Take 81 mg by mouth daily., Disp: , Rfl:  .  atorvastatin (LIPITOR) 40 MG tablet, Take 1 tablet (40 mg total) by mouth daily., Disp: 30 tablet, Rfl: 2 .  budesonide-formoterol (SYMBICORT) 160-4.5 MCG/ACT inhaler, Inhale 2 puffs into the lungs 2 (two) times daily., Disp: , Rfl:  .  clopidogrel (PLAVIX) 75 MG tablet, Take 1 tablet (75 mg total) by mouth daily., Disp: 90  tablet, Rfl: 3 .  ipratropium (ATROVENT) 0.03 % nasal spray, Place 2 sprays into both nostrils daily. , Disp: , Rfl:  .  lisinopril (PRINIVIL,ZESTRIL) 30 MG tablet, TAKE ONE (1) TABLET EACH DAY, Disp: 30 tablet, Rfl: 6 .  metoprolol (LOPRESSOR) 50 MG tablet, Take 1 tablet (50 mg total) by mouth 2 (two) times daily., Disp: 60 tablet, Rfl: 2 .  nitroGLYCERIN (NITROSTAT) 0.4 MG SL tablet, Place 1 tablet (0.4 mg total) under the tongue every 5 (five) minutes as needed for chest pain., Disp: 25 tablet, Rfl: 0 .  terazosin (HYTRIN) 1 MG capsule, Take 1 capsule (1 mg total) by mouth at bedtime., Disp: 90 capsule, Rfl: 3  Not on File   Review of Systems  Constitutional: Negative for chills, fever, malaise/fatigue and weight loss.  HENT: Negative for hearing loss and tinnitus.   Eyes: Negative for  blurred vision and double vision.  Respiratory: Negative for cough, shortness of breath and wheezing.   Cardiovascular: Negative for chest pain, palpitations and leg swelling.  Gastrointestinal: Negative for abdominal pain, blood in stool and heartburn.  Genitourinary: Negative for dysuria, frequency and urgency.  Musculoskeletal: Negative for joint pain and myalgias.  Skin: Negative for rash.  Neurological: Positive for focal weakness (L hand arm (mild).) and weakness (l sided (mild).). Negative for dizziness, tingling, tremors, sensory change, speech change and headaches.      Objective  Vitals:   04/08/16 1511 04/08/16 1611  BP: (!) 116/53 (!) 145/60  Pulse: 72   Resp: 16   Temp: 98.2 F (36.8 C)   TempSrc: Oral   Weight: 149 lb (67.6 kg)   Height: _0  (1.753 m)     Physical Exam  Constitutional: He is oriented to person, place, and time and well-developed, well-nourished, and in no distress. No distress.  HENT:  Head: Normocephalic and atraumatic.  Eyes: Conjunctivae and EOM are normal. Pupils are equal, round, and reactive to light. No scleral icterus.  Neck: Normal range of motion. Neck supple. Carotid bruit is not present. No thyromegaly present.  Cardiovascular: Normal rate, regular rhythm and normal heart sounds.  Exam reveals no gallop and no friction rub.   No murmur heard. Pulmonary/Chest: Effort normal and breath sounds normal. No respiratory distress. He has no wheezes. He has no rales.  Abdominal: Soft. Bowel sounds are normal. He exhibits no distension and no mass. There is no tenderness.  Musculoskeletal: He exhibits no edema.  Lymphadenopathy:    He has no cervical adenopathy.  Neurological: He is alert and oriented to person, place, and time. No cranial nerve deficit. Gait normal.  Vitals reviewed.      Recent Results (from the past 2160 hour(s))  Ethanol     Status: None   Collection Time: 03/30/16 12:07 AM  Result Value Ref Range   Alcohol,  Ethyl (B) <5 <5 mg/dL    Comment:        LOWEST DETECTABLE LIMIT FOR SERUM ALCOHOL IS 5 mg/dL FOR MEDICAL PURPOSES ONLY   Protime-INR     Status: None   Collection Time: 03/30/16 12:07 AM  Result Value Ref Range   Prothrombin Time 12.7 11.4 - 15.2 seconds   INR 0.95   APTT     Status: None   Collection Time: 03/30/16 12:07 AM  Result Value Ref Range   aPTT 32 24 - 36 seconds  Comprehensive metabolic panel     Status: Abnormal   Collection Time: 03/30/16 12:07 AM  Result Value Ref Range  Sodium 140 135 - 145 mmol/L   Potassium 3.7 3.5 - 5.1 mmol/L   Chloride 101 101 - 111 mmol/L   CO2 29 22 - 32 mmol/L   Glucose, Bld 206 (H) 65 - 99 mg/dL   BUN 16 6 - 20 mg/dL   Creatinine, Ser 0.87 0.61 - 1.24 mg/dL   Calcium 9.7 8.9 - 10.3 mg/dL   Total Protein 7.2 6.5 - 8.1 g/dL   Albumin 4.3 3.5 - 5.0 g/dL   AST 30 15 - 41 U/L   ALT 15 (L) 17 - 63 U/L   Alkaline Phosphatase 89 38 - 126 U/L   Total Bilirubin 0.6 0.3 - 1.2 mg/dL   GFR calc non Af Amer >60 >60 mL/min   GFR calc Af Amer >60 >60 mL/min    Comment: (NOTE) The eGFR has been calculated using the CKD EPI equation. This calculation has not been validated in all clinical situations. eGFR's persistently <60 mL/min signify possible Chronic Kidney Disease.    Anion gap 10 5 - 15  CBC WITH DIFFERENTIAL     Status: None   Collection Time: 03/30/16 12:07 AM  Result Value Ref Range   WBC 7.8 3.8 - 10.6 K/uL   RBC 4.79 4.40 - 5.90 MIL/uL   Hemoglobin 15.1 13.0 - 18.0 g/dL   HCT 43.4 40.0 - 52.0 %   MCV 90.6 80.0 - 100.0 fL   MCH 31.5 26.0 - 34.0 pg   MCHC 34.8 32.0 - 36.0 g/dL   RDW 13.4 11.5 - 14.5 %   Platelets 198 150 - 440 K/uL   Neutrophils Relative % 74 %   Neutro Abs 5.8 1.4 - 6.5 K/uL   Lymphocytes Relative 14 %   Lymphs Abs 1.1 1.0 - 3.6 K/uL   Monocytes Relative 8 %   Monocytes Absolute 0.6 0.2 - 1.0 K/uL   Eosinophils Relative 3 %   Eosinophils Absolute 0.2 0 - 0.7 K/uL   Basophils Relative 1 %   Basophils  Absolute 0.0 0 - 0.1 K/uL  Troponin I     Status: None   Collection Time: 03/30/16 12:07 AM  Result Value Ref Range   Troponin I <0.03 <0.03 ng/mL  Urinalysis, Routine w reflex microscopic     Status: Abnormal   Collection Time: 03/30/16 12:52 AM  Result Value Ref Range   Color, Urine YELLOW (A) YELLOW   APPearance HAZY (A) CLEAR   Specific Gravity, Urine 1.010 1.005 - 1.030   pH 6.0 5.0 - 8.0   Glucose, UA 50 (A) NEGATIVE mg/dL   Hgb urine dipstick SMALL (A) NEGATIVE   Bilirubin Urine NEGATIVE NEGATIVE   Ketones, ur NEGATIVE NEGATIVE mg/dL   Protein, ur 30 (A) NEGATIVE mg/dL   Nitrite POSITIVE (A) NEGATIVE   Leukocytes, UA SMALL (A) NEGATIVE   RBC / HPF 0-5 0 - 5 RBC/hpf   WBC, UA 6-30 0 - 5 WBC/hpf   Bacteria, UA MANY (A) NONE SEEN   Squamous Epithelial / LPF NONE SEEN NONE SEEN   Mucous PRESENT   Urine Drug Screen, Qualitative ([facility] only)     Status: None   Collection Time: 03/30/16 12:52 AM  Result Value Ref Range   Tricyclic, Ur Screen NONE DETECTED NONE DETECTED   Amphetamines, Ur Screen NONE DETECTED NONE DETECTED   MDMA (Ecstasy)Ur Screen NONE DETECTED NONE DETECTED   Cocaine Metabolite,Ur [place] NONE DETECTED NONE DETECTED   Opiate, Ur Screen NONE DETECTED NONE DETECTED   Phencyclidine (PCP) Ur S NONE  DETECTED NONE DETECTED   Cannabinoid 50 Ng, Ur [place] NONE DETECTED NONE DETECTED   Barbiturates, Ur Screen NONE DETECTED NONE DETECTED   Benzodiazepine, Ur Scrn NONE DETECTED NONE DETECTED   Methadone Scn, Ur NONE DETECTED NONE DETECTED    Comment: (NOTE) 540  Tricyclics, urine               Cutoff 1000 ng/mL 200  Amphetamines, urine             Cutoff 1000 ng/mL 300  MDMA (Ecstasy), urine           Cutoff 500 ng/mL 400  Cocaine Metabolite, urine       Cutoff 300 ng/mL 500  Opiate, urine                   Cutoff 300 ng/mL 600  Phencyclidine (PCP), urine      Cutoff 25 ng/mL 700  Cannabinoid, urine              Cutoff 50 ng/mL 800  Barbiturates, urine             Cutoff 200  ng/mL 900  Benzodiazepine, urine           Cutoff 200 ng/mL 1000 Methadone, urine                Cutoff 300 ng/mL 1100 1200 The urine drug screen provides only a preliminary, unconfirmed 1300 analytical test result and should not be used for non-medical 1400 purposes. Clinical consideration and professional judgment should 1500 be applied to any positive drug screen result due to possible 1600 interfering substances. A more specific alternate chemical method 1700 must be used in order to obtain a confirmed analytical result.  1800 Gas chromato graphy / mass spectrometry (GC/MS) is the preferred 1900 confirmatory method.   Urine culture     Status: Abnormal   Collection Time: 03/30/16 12:52 AM  Result Value Ref Range   Specimen Description URINE, RANDOM    Special Requests NONE    Culture >=100,000 COLONIES/mL ESCHERICHIA COLI (A)    Report Status 04/02/2016 FINAL    Organism ID, Bacteria ESCHERICHIA COLI (A)       Susceptibility   Escherichia coli - MIC*    AMPICILLIN <=2 SENSITIVE Sensitive     CEFAZOLIN <=4 SENSITIVE Sensitive     CEFTRIAXONE <=1 SENSITIVE Sensitive     CIPROFLOXACIN >=4 RESISTANT Resistant     GENTAMICIN <=1 SENSITIVE Sensitive     IMIPENEM <=0.25 SENSITIVE Sensitive     NITROFURANTOIN <=16 SENSITIVE Sensitive     TRIMETH/SULFA <=20 SENSITIVE Sensitive     AMPICILLIN/SULBACTAM <=2 SENSITIVE Sensitive     PIP/TAZO <=4 SENSITIVE Sensitive     Extended ESBL NEGATIVE Sensitive     * >=100,000 COLONIES/mL ESCHERICHIA COLI  Glucose, capillary     Status: Abnormal   Collection Time: 03/30/16  3:55 AM  Result Value Ref Range   Glucose-Capillary 171 (H) 65 - 99 mg/dL  Hemoglobin A1c     Status: Abnormal   Collection Time: 03/30/16  5:59 AM  Result Value Ref Range   Hgb A1c MFr Bld 7.4 (H) 4.8 - 5.6 %    Comment: (NOTE)         Pre-diabetes: 5.7 - 6.4         Diabetes: >6.4         Glycemic control for adults with diabetes: <7.0    Mean Plasma Glucose 166  mg/dL    Comment: (  NOTE) Performed At: [facility] [place], [place] [id number] Lindon Romp MD [id number]   Lipid panel     Status: Abnormal   Collection Time: 03/30/16  5:59 AM  Result Value Ref Range   Cholesterol 158 0 - 200 mg/dL   Triglycerides 215 (H) <150 mg/dL   HDL 48 >40 mg/dL   Total CHOL/HDL Ratio 3.3 RATIO   VLDL 43 (H) 0 - 40 mg/dL   LDL Cholesterol 67 0 - 99 mg/dL    Comment:        Total Cholesterol/HDL:CHD Risk Coronary Heart Disease Risk Table                     Men   Women  1/2 Average Risk   3.4   3.3  Average Risk       5.0   4.4  2 X Average Risk   9.6   7.1  3 X Average Risk  23.4   11.0        Use the calculated Patient Ratio above and the CHD Risk Table to determine the patient's CHD Risk.        ATP III CLASSIFICATION (LDL):  <100     mg/dL   Optimal  100-129  mg/dL   Near or Above                    Optimal  130-159  mg/dL   Borderline  160-189  mg/dL   High  >190     mg/dL   Very High   Glucose, capillary     Status: Abnormal   Collection Time: 03/30/16  7:34 AM  Result Value Ref Range   Glucose-Capillary 199 (H) 65 - 99 mg/dL   Comment 1 Notify RN    Comment 2 Document in Chart   Echocardiogram     Status: None   Collection Time: 03/30/16  8:31 AM  Result Value Ref Range   Weight 2,400 oz   Height 69 in   BP 138/54 mmHg  Glucose, capillary     Status: Abnormal   Collection Time: 03/30/16 11:26 AM  Result Value Ref Range   Glucose-Capillary 103 (H) 65 - 99 mg/dL   Comment 1 Notify RN    Comment 2 Document in Chart   Glucose, capillary     Status: Abnormal   Collection Time: 03/30/16  4:38 PM  Result Value Ref Range   Glucose-Capillary 146 (H) 65 - 99 mg/dL   Comment 1 Notify RN    Comment 2 Document in Chart   Glucose, capillary     Status: Abnormal   Collection Time: 03/30/16  9:06 PM  Result Value Ref Range   Glucose-Capillary 252 (H) 65 - 99 mg/dL   Comment 1 Notify RN   Basic metabolic  panel     Status: Abnormal   Collection Time: 03/31/16  4:26 AM  Result Value Ref Range   Sodium 145 135 - 145 mmol/L   Potassium 3.6 3.5 - 5.1 mmol/L   Chloride 104 101 - 111 mmol/L   CO2 32 22 - 32 mmol/L   Glucose, Bld 102 (H) 65 - 99 mg/dL   BUN 13 6 - 20 mg/dL   Creatinine, Ser 1.01 0.61 - 1.24 mg/dL   Calcium 9.4 8.9 - 10.3 mg/dL   GFR calc non Af Amer >60 >60 mL/min   GFR calc Af Amer >60 >60 mL/min    Comment: (NOTE) The  eGFR has been calculated using the CKD EPI equation. This calculation has not been validated in all clinical situations. eGFR's persistently <60 mL/min signify possible Chronic Kidney Disease.    Anion gap 9 5 - 15  Glucose, capillary     Status: Abnormal   Collection Time: 03/31/16  7:42 AM  Result Value Ref Range   Glucose-Capillary 115 (H) 65 - 99 mg/dL  Glucose, capillary     Status: Abnormal   Collection Time: 03/31/16 11:53 AM  Result Value Ref Range   Glucose-Capillary 218 (H) 65 - 99 mg/dL     Assessment & Plan  Problem List Items Addressed This Visit      Cardiovascular and Mediastinum   Essential hypertension   Relevant Medications   lisinopril (PRINIVIL,ZESTRIL) 30 MG tablet   CVA (cerebral vascular accident) ([place])   Relevant Medications   lisinopril (PRINIVIL,ZESTRIL) 30 MG tablet     Respiratory   COPD (chronic obstructive pulmonary disease) (HCC)     Endocrine   Type 2 diabetes mellitus without complication (HCC)   Relevant Medications   lisinopril (PRINIVIL,ZESTRIL) 30 MG tablet     Other   S/P coronary artery stent placement   Amputation of leg ([place])    Other Visit Diagnoses    Abnormal urine    -  Primary   Relevant Orders   Urine Culture      Meds ordered this encounter  Medications  . lisinopril (PRINIVIL,ZESTRIL) 30 MG tablet    Sig: TAKE ONE (1) TABLET EACH DAY    Dispense:  30 tablet    Refill:  6   1. Abnormal urine  - Urine Culture  2. Cerebrovascular accident (CVA), unspecified mechanism  ([place]) Use home health PT.  3. Essential hypertension Cont others - lisinopril (PRINIVIL,ZESTRIL) 30 MG tablet; TAKE ONE (1) TABLET EACH DAY  Dispense: 30 tablet; Refill: 6  4. Centrilobular emphysema (HCC) Cont inhalrs  5. Type 2 diabetes mellitus without complication, without long-term current use of insulin (HCC) Cont to watch sugars  6. S/P coronary artery stent placement   7. Amputation of leg ([place])

## 2016-04-08 NOTE — Patient Instructions (Signed)
Plan labs with A1c on return

## 2016-04-09 DIAGNOSIS — E119 Type 2 diabetes mellitus without complications: Secondary | ICD-10-CM | POA: Diagnosis not present

## 2016-04-09 DIAGNOSIS — Z89511 Acquired absence of right leg below knee: Secondary | ICD-10-CM | POA: Diagnosis not present

## 2016-04-09 DIAGNOSIS — J449 Chronic obstructive pulmonary disease, unspecified: Secondary | ICD-10-CM | POA: Diagnosis not present

## 2016-04-09 DIAGNOSIS — Z7982 Long term (current) use of aspirin: Secondary | ICD-10-CM | POA: Diagnosis not present

## 2016-04-09 DIAGNOSIS — D649 Anemia, unspecified: Secondary | ICD-10-CM | POA: Diagnosis not present

## 2016-04-09 DIAGNOSIS — E785 Hyperlipidemia, unspecified: Secondary | ICD-10-CM | POA: Diagnosis not present

## 2016-04-09 DIAGNOSIS — Z7951 Long term (current) use of inhaled steroids: Secondary | ICD-10-CM | POA: Diagnosis not present

## 2016-04-09 DIAGNOSIS — I10 Essential (primary) hypertension: Secondary | ICD-10-CM | POA: Diagnosis not present

## 2016-04-09 DIAGNOSIS — H3530 Unspecified macular degeneration: Secondary | ICD-10-CM | POA: Diagnosis not present

## 2016-04-09 DIAGNOSIS — N400 Enlarged prostate without lower urinary tract symptoms: Secondary | ICD-10-CM | POA: Diagnosis not present

## 2016-04-09 DIAGNOSIS — I69334 Monoplegia of upper limb following cerebral infarction affecting left non-dominant side: Secondary | ICD-10-CM | POA: Diagnosis not present

## 2016-04-09 DIAGNOSIS — I2510 Atherosclerotic heart disease of native coronary artery without angina pectoris: Secondary | ICD-10-CM | POA: Diagnosis not present

## 2016-04-09 DIAGNOSIS — F17210 Nicotine dependence, cigarettes, uncomplicated: Secondary | ICD-10-CM | POA: Diagnosis not present

## 2016-04-10 ENCOUNTER — Inpatient Hospital Stay: Payer: PPO | Admitting: Family Medicine

## 2016-04-10 LAB — URINE CULTURE: ORGANISM ID, BACTERIA: NO GROWTH

## 2016-04-21 DIAGNOSIS — R531 Weakness: Secondary | ICD-10-CM | POA: Diagnosis not present

## 2016-04-21 DIAGNOSIS — Z8673 Personal history of transient ischemic attack (TIA), and cerebral infarction without residual deficits: Secondary | ICD-10-CM | POA: Diagnosis not present

## 2016-05-05 ENCOUNTER — Ambulatory Visit (INDEPENDENT_AMBULATORY_CARE_PROVIDER_SITE_OTHER): Payer: PPO | Admitting: Vascular Surgery

## 2016-05-05 ENCOUNTER — Encounter (INDEPENDENT_AMBULATORY_CARE_PROVIDER_SITE_OTHER): Payer: Self-pay | Admitting: Vascular Surgery

## 2016-05-05 VITALS — BP 132/74 | HR 71 | Resp 16 | Ht 69.0 in | Wt 150.8 lb

## 2016-05-05 DIAGNOSIS — Z89619 Acquired absence of unspecified leg above knee: Secondary | ICD-10-CM

## 2016-05-05 DIAGNOSIS — E119 Type 2 diabetes mellitus without complications: Secondary | ICD-10-CM

## 2016-05-05 DIAGNOSIS — I10 Essential (primary) hypertension: Secondary | ICD-10-CM | POA: Diagnosis not present

## 2016-05-05 DIAGNOSIS — I25111 Atherosclerotic heart disease of native coronary artery with angina pectoris with documented spasm: Secondary | ICD-10-CM | POA: Diagnosis not present

## 2016-05-05 DIAGNOSIS — I63239 Cerebral infarction due to unspecified occlusion or stenosis of unspecified carotid arteries: Secondary | ICD-10-CM | POA: Diagnosis not present

## 2016-05-05 DIAGNOSIS — I639 Cerebral infarction, unspecified: Secondary | ICD-10-CM

## 2016-05-05 DIAGNOSIS — S88919A Complete traumatic amputation of unspecified lower leg, level unspecified, initial encounter: Secondary | ICD-10-CM

## 2016-05-05 DIAGNOSIS — I6529 Occlusion and stenosis of unspecified carotid artery: Secondary | ICD-10-CM | POA: Insufficient documentation

## 2016-05-05 NOTE — Progress Notes (Signed)
MRN : [id number]  Stanley Mclean is a 76 y.o. (September 14, 1940) male who presents with chief complaint of  Chief Complaint  Patient presents with  . New Patient (Initial Visit)  .  History of Present Illness: The patient is seen for new evaluation of carotid stenosis status post CT angiogram. The patient presented to the emergency room on 03/30/2016 noting numbness of his left arm. He also was found on examination at that time to be unable to raise his arm more than 45. The symptoms lasted approximately 8 hours and that received resolved in its entirety. CT scan was performed during that admission. MRI done on 03/30/2016 showed a right hemispheric stroke acute in nature. CT scan was done 03/31/2016. Patient reports that the test went well with no problems or complications.  The patient denies interval amaurosis fugax. There is no recent or interval TIA symptoms or focal motor deficits. There is no prior documented CVA.  The patient is taking enteric-coated aspirin 81 mg daily.  There is no history of migraine headaches. There is no history of seizures.  The patient has a history of coronary artery disease, no recent episodes of angina or shortness of breath. The patient denies PAD or claudication symptoms. There is a history of hyperlipidemia which is being treated with a statin.    CT angiogram is reviewed by me personally and shows likely 85-90% stenosis at the origin of the right internal carotid artery I do not identify any stricture of the petrous portion. This is by my personal review of the CT scan images.   Current Meds  Medication Sig  . albuterol (PROVENTIL HFA;VENTOLIN HFA) 108 (90 BASE) MCG/ACT inhaler Inhale 2 puffs into the lungs every 6 (six) hours as needed for wheezing or shortness of breath.  Marland Kitchen amLODipine (NORVASC) 10 MG tablet Take 1 tablet (10 mg total) by mouth daily.  Marland Kitchen aspirin 81 MG tablet Take 81 mg by mouth daily.  Marland Kitchen atorvastatin (LIPITOR) 40 MG tablet Take 1  tablet (40 mg total) by mouth daily.  . budesonide-formoterol (SYMBICORT) 160-4.5 MCG/ACT inhaler Inhale 2 puffs into the lungs 2 (two) times daily.  . clopidogrel (PLAVIX) 75 MG tablet Take 1 tablet (75 mg total) by mouth daily.  Marland Kitchen ipratropium (ATROVENT) 0.03 % nasal spray Place 2 sprays into both nostrils daily.   Marland Kitchen lisinopril (PRINIVIL,ZESTRIL) 30 MG tablet TAKE ONE (1) TABLET EACH DAY  . metoprolol (LOPRESSOR) 50 MG tablet Take 1 tablet (50 mg total) by mouth 2 (two) times daily.  . nitroGLYCERIN (NITROSTAT) 0.4 MG SL tablet Place 1 tablet (0.4 mg total) under the tongue every 5 (five) minutes as needed for chest pain.  Marland Kitchen terazosin (HYTRIN) 1 MG capsule Take 1 capsule (1 mg total) by mouth at bedtime.    Past Medical History:  Diagnosis Date  . Anemia   . BPH (benign prostatic hyperplasia)   . CAD (coronary artery disease)    a. s/p multiple prior PCIs b. s/p DES-distal RCA 05/2013  . COPD (chronic obstructive pulmonary disease) ([place])   . GERD (gastroesophageal reflux disease)   . Macular degeneration   . Pure hypercholesterolemia   . Tobacco abuse   . Traumatic amputation of leg(s) (complete) (partial), unilateral, level not specified, without mention of complication   . Type II or unspecified type diabetes mellitus without mention of complication, not stated as uncontrolled   . Unspecified essential hypertension     Past Surgical History:  Procedure Laterality Date  . AMPUTATION  08/26/2011  . APPENDECTOMY    . CARDIAC CATHETERIZATION    . CORONARY ANGIOPLASTY WITH STENT PLACEMENT  09/28/2006   RCA  . CORONARY ANGIOPLASTY WITH STENT PLACEMENT  07/21/2006   Mid LAD  . CORONARY ANGIOPLASTY WITH STENT PLACEMENT  06/06/2013   30% prox LAD at the site of a prior stent, 40% mid LAD at the origin of D1, 40% ostial D1, 30% prox LCx at the site of a prior stent, in the proximal third of the vessel segment, 20% prox RCA, 99% distal RCA s/p DES. EF >55%.    Social History Social History    Substance Use Topics  . Smoking status: Former Smoker    Packs/day: 1.00    Years: 50.00    Types: Cigarettes  . Smokeless tobacco: Never Used  . Alcohol use No    Family History Family History  Problem Relation Age of Onset  . Mental illness Mother   No family history of bleeding/clotting disorders, porphyria or autoimmune disease   No Known Allergies   REVIEW OF SYSTEMS (Negative unless checked)  Constitutional: _0 Weight loss  _1 Fever  _2 Chills Cardiac: _3 Chest pain   _4 Chest pressure   _5 Palpitations   _6 Shortness of breath when laying flat   _7 Shortness of breath with exertion. Vascular:  _8 Pain in legs with walking   _9 Pain in legs at rest  _10 History of DVT   _11 Phlebitis   _12 Swelling in legs   _13 Varicose veins   _14 Non-healing ulcers Pulmonary:   _15 Uses home oxygen   _16 Productive cough   _17 Hemoptysis   _18 Wheeze  _19 COPD   _20 Asthma Neurologic:  _21 Dizziness   _22 Seizures   _23 History of stroke   _24 History of TIA  _25 Aphasia   _26 Vissual changes   _27 Weakness or numbness in arm   _28 Weakness or numbness in leg Musculoskeletal:   _29 Joint swelling   _30 Joint pain   _31 Low back pain Hematologic:  _32 Easy bruising  _33 Easy bleeding   _34 Hypercoagulable state   _35 Anemic Gastrointestinal:  _36 Diarrhea   _37 Vomiting  _38 Gastroesophageal reflux/heartburn   _39 Difficulty swallowing. Genitourinary:  _40 Chronic kidney disease   _41 Difficult urination  _42 Frequent urination   _43 Blood in urine Skin:  _44 Rashes   _45 Ulcers  Psychological:  _46 History of anxiety   _47  History of major depression.  Physical Examination  Vitals:   05/05/16 1357  BP: 132/74  Pulse: 71  Resp: 16  Weight: 150 lb 12.8 oz (68.4 kg)  Height: _48  (1.753 m)   Body mass index is 22.27 kg/m. Gen: WD/WN, NAD Head: [place]/AT, No temporalis wasting.  Ear/Nose/Throat: Hearing grossly intact, nares w/o erythema or drainage, poor dentition Eyes: PER, EOMI, sclera nonicteric.  Neck: Supple, no masses.  No bruit or JVD.  Pulmonary:  Good  air movement, clear to auscultation bilaterally, no use of accessory muscles.  Cardiac: RRR, normal S1, S2, no Murmurs. Vascular: Bilateral carotid bruits identified right sided more harsh than left Vessel Right Left  Radial Palpable Palpable  Ulnar Palpable Palpable  Brachial Palpable Palpable  Carotid Palpable Palpable  Femoral Palpable Palpable  Popliteal Right amputation  Palpable  PT Right amputation  Trace Palpable  DP Right amputation  1+ Palpable   Gastrointestinal: soft, non-distended. No guarding/no peritoneal signs.  Musculoskeletal: M/S 5/5 throughout, No significant discrepancy between the right and left arms.  No deformity or atrophy.  Neurologic: CN 2-12 intact. Pain and light touch intact in extremities.  Symmetrical.  Speech is fluent. Motor exam as listed above. Psychiatric: Judgment intact, Mood & affect appropriate for pt's clinical  situation. Dermatologic: No rashes or ulcers noted.  No changes consistent with cellulitis. Lymph : No Cervical lymphadenopathy, no lichenification or skin changes of chronic lymphedema.  CBC Lab Results  Component Value Date   WBC 7.8 03/30/2016   HGB 15.1 03/30/2016   HCT 43.4 03/30/2016   MCV 90.6 03/30/2016   PLT 198 03/30/2016    BMET    Component Value Date/Time   NA 145 03/31/2016 0426   NA 141 06/07/2013 0459   K 3.6 03/31/2016 0426   K 3.1 (L) 06/07/2013 0459   CL 104 03/31/2016 0426   CL 106 06/07/2013 0459   CO2 32 03/31/2016 0426   CO2 28 06/07/2013 0459   GLUCOSE 102 (H) 03/31/2016 0426   GLUCOSE 97 06/07/2013 0459   BUN 13 03/31/2016 0426   BUN 13 06/07/2013 0459   CREATININE 1.01 03/31/2016 0426   CREATININE 1.06 06/07/2013 0459   CALCIUM 9.4 03/31/2016 0426   CALCIUM 8.5 06/07/2013 0459   GFRNONAA >60 03/31/2016 0426   GFRNONAA >60 06/07/2013 0459   GFRAA >60 03/31/2016 0426   GFRAA >60 06/07/2013 0459   CrCl cannot be calculated (Patient's most recent lab result is older than the maximum 21 days  allowed.).  COAG Lab Results  Component Value Date   INR 0.95 03/30/2016   INR 0.9 06/01/2013    Radiology No results found.   Assessment/Plan 1. Carotid stenosis, symptomatic, with infarction [facility]) Recommend:  The patient is symptomatic with respect to the carotid stenosis.  The patient  has a lesion the is >70% by my read. Per the radiology read there is only a 60% stenosis and a high-grade stenosis of the pitreous portion of the internal carotid artery. For this reason I am planning for angiography and at that time if indeed my read is correct stenting will be performed.   Patient's CT angiography of the carotid arteries confirms >70% right ICA stenosis.  The anatomical considerations support stenting over surgery.  This was discussed in detail with the patient.  The risks, benefits and alternative therapies were reviewed in detail with the patient.  All questions were answered.  The patient agrees to proceed with stenting of the rightid artery.  Continue antiplatelet therapy as prescribed. Continue management of CAD, HTN and Hyperlipidemia. Healthy heart diet, encouraged exercise at least 4 times per week.   - Ambulatory referral to Cardiology  2. Cerebrovascular accident (CVA), unspecified mechanism ([place]) Continue antiplatelet therapy. Given the high-grade stricture stenosis of the right internal carotid artery in association with a right hemispheric stroke treatment of his carotid is indicated as noted above  3. Atherosclerosis of native coronary artery of native heart with angina pectoris with documented spasm ([facility]) Continue cardiac and antihypertensive medications as already ordered and reviewed, no changes at this time.  Continue statin as ordered and reviewed, no changes at this time  Nitrates PRN for chest pain   4. Essential hypertension Continue antihypertensive medications as already ordered, these medications have been reviewed and there are no changes at this  time.   5. Controlled type 2 diabetes mellitus without complication, without long-term current use of insulin ([place]) Continue hypoglycemic medications as already ordered, these medications have been reviewed and there are no changes at this time.  Hgb A1C to be monitored as already arranged by primary service   6. Amputation of leg ([place]) Continue use of prosthesis right lower extremity    Hortencia Pilar, MD  05/05/2016 2:36 PM

## 2016-05-06 ENCOUNTER — Telehealth: Payer: Self-pay | Admitting: Cardiovascular Disease

## 2016-05-06 NOTE — Telephone Encounter (Signed)
Received cardiac clearance request for pt to proceed w/ carotid stent angio placement w/ Dr. Rica Koyanagi. DOS has not been scheduled yet pending this clearance.  Please route clearance to [facility] @ [phone].

## 2016-05-06 NOTE — Telephone Encounter (Signed)
Acceptable risk for surgery Would call patient and make sure that blood pressure is better on amlodipine Needs to pass anesthesia approval prior to surgery, needs good blood pressure

## 2016-05-07 NOTE — Telephone Encounter (Signed)
Clearance routed to number provided.

## 2016-05-07 NOTE — Telephone Encounter (Signed)
Spoke with patient and he states that his blood pressure has been better. Yesterday it was 130's over 80's and he feels that the new medication is helping. Let him know that I would forward cardiac clearance over to Dr. Henrine Screws office and to give [place] a call back if he has any further questions.

## 2016-05-08 ENCOUNTER — Encounter (INDEPENDENT_AMBULATORY_CARE_PROVIDER_SITE_OTHER): Payer: Self-pay

## 2016-05-12 ENCOUNTER — Other Ambulatory Visit: Payer: Self-pay

## 2016-05-12 ENCOUNTER — Encounter
Admission: RE | Admit: 2016-05-12 | Discharge: 2016-05-12 | Disposition: A | Discharge status: Home / Self Care | Payer: PPO | Source: Ambulatory Visit | Attending: Vascular Surgery | Admitting: Vascular Surgery

## 2016-05-12 ENCOUNTER — Other Ambulatory Visit (INDEPENDENT_AMBULATORY_CARE_PROVIDER_SITE_OTHER): Payer: Self-pay

## 2016-05-12 DIAGNOSIS — E119 Type 2 diabetes mellitus without complications: Secondary | ICD-10-CM | POA: Diagnosis not present

## 2016-05-12 DIAGNOSIS — Z87891 Personal history of nicotine dependence: Secondary | ICD-10-CM | POA: Diagnosis not present

## 2016-05-12 DIAGNOSIS — Z7982 Long term (current) use of aspirin: Secondary | ICD-10-CM | POA: Diagnosis not present

## 2016-05-12 DIAGNOSIS — I25111 Atherosclerotic heart disease of native coronary artery with angina pectoris with documented spasm: Secondary | ICD-10-CM | POA: Diagnosis not present

## 2016-05-12 DIAGNOSIS — I639 Cerebral infarction, unspecified: Secondary | ICD-10-CM | POA: Diagnosis not present

## 2016-05-12 DIAGNOSIS — Z955 Presence of coronary angioplasty implant and graft: Secondary | ICD-10-CM | POA: Diagnosis not present

## 2016-05-12 DIAGNOSIS — H3530 Unspecified macular degeneration: Secondary | ICD-10-CM | POA: Diagnosis not present

## 2016-05-12 DIAGNOSIS — J449 Chronic obstructive pulmonary disease, unspecified: Secondary | ICD-10-CM | POA: Diagnosis not present

## 2016-05-12 DIAGNOSIS — Z89611 Acquired absence of right leg above knee: Secondary | ICD-10-CM | POA: Diagnosis not present

## 2016-05-12 DIAGNOSIS — K219 Gastro-esophageal reflux disease without esophagitis: Secondary | ICD-10-CM | POA: Diagnosis not present

## 2016-05-12 DIAGNOSIS — E7800 Pure hypercholesterolemia, unspecified: Secondary | ICD-10-CM | POA: Diagnosis not present

## 2016-05-12 DIAGNOSIS — Z818 Family history of other mental and behavioral disorders: Secondary | ICD-10-CM | POA: Diagnosis not present

## 2016-05-12 DIAGNOSIS — I63231 Cerebral infarction due to unspecified occlusion or stenosis of right carotid arteries: Secondary | ICD-10-CM | POA: Diagnosis not present

## 2016-05-12 DIAGNOSIS — I10 Essential (primary) hypertension: Secondary | ICD-10-CM | POA: Diagnosis not present

## 2016-05-12 DIAGNOSIS — D649 Anemia, unspecified: Secondary | ICD-10-CM | POA: Diagnosis not present

## 2016-05-12 HISTORY — DX: Cerebral infarction, unspecified: I63.9

## 2016-05-12 LAB — BASIC METABOLIC PANEL
ANION GAP: 6 (ref 5–15)
BUN: 14 mg/dL (ref 6–20)
CHLORIDE: 103 mmol/L (ref 101–111)
CO2: 30 mmol/L (ref 22–32)
Calcium: 9.5 mg/dL (ref 8.9–10.3)
Creatinine, Ser: 1.12 mg/dL (ref 0.61–1.24)
GFR calc Af Amer: >60 mL/min (ref >60)
GFR calc non Af Amer: >60 mL/min (ref >60)
Glucose, Bld: 297 mg/dL — ABNORMAL HIGH (ref 65–99)
POTASSIUM: 4 mmol/L (ref 3.5–5.1)
Sodium: 139 mmol/L (ref 135–145)

## 2016-05-12 MED ORDER — CLOPIDOGREL BISULFATE 75 MG PO TABS: 75.0000 mg | ORAL_TABLET | Freq: Every day | ORAL | 3 refills | Status: DC

## 2016-05-14 ENCOUNTER — Ambulatory Visit
Admission: RE | Admit: 2016-05-14 | Discharge: 2016-05-14 | Disposition: A | Discharge status: Home / Self Care | Payer: PPO | Source: Ambulatory Visit | Attending: Vascular Surgery | Admitting: Vascular Surgery

## 2016-05-14 ENCOUNTER — Encounter
Admission: RE | Disposition: A | Discharge status: Home / Self Care | Payer: Self-pay | Source: Ambulatory Visit | Attending: Vascular Surgery

## 2016-05-14 DIAGNOSIS — Z7982 Long term (current) use of aspirin: Secondary | ICD-10-CM | POA: Insufficient documentation

## 2016-05-14 DIAGNOSIS — I639 Cerebral infarction, unspecified: Secondary | ICD-10-CM | POA: Diagnosis not present

## 2016-05-14 DIAGNOSIS — Z89611 Acquired absence of right leg above knee: Secondary | ICD-10-CM | POA: Insufficient documentation

## 2016-05-14 DIAGNOSIS — J449 Chronic obstructive pulmonary disease, unspecified: Secondary | ICD-10-CM | POA: Insufficient documentation

## 2016-05-14 DIAGNOSIS — E119 Type 2 diabetes mellitus without complications: Secondary | ICD-10-CM | POA: Insufficient documentation

## 2016-05-14 DIAGNOSIS — I63231 Cerebral infarction due to unspecified occlusion or stenosis of right carotid arteries: Secondary | ICD-10-CM | POA: Insufficient documentation

## 2016-05-14 DIAGNOSIS — I25111 Atherosclerotic heart disease of native coronary artery with angina pectoris with documented spasm: Secondary | ICD-10-CM | POA: Insufficient documentation

## 2016-05-14 DIAGNOSIS — I10 Essential (primary) hypertension: Secondary | ICD-10-CM | POA: Insufficient documentation

## 2016-05-14 DIAGNOSIS — I6521 Occlusion and stenosis of right carotid artery: Secondary | ICD-10-CM | POA: Diagnosis not present

## 2016-05-14 DIAGNOSIS — Z87891 Personal history of nicotine dependence: Secondary | ICD-10-CM | POA: Insufficient documentation

## 2016-05-14 DIAGNOSIS — H3530 Unspecified macular degeneration: Secondary | ICD-10-CM | POA: Insufficient documentation

## 2016-05-14 DIAGNOSIS — Z955 Presence of coronary angioplasty implant and graft: Secondary | ICD-10-CM | POA: Insufficient documentation

## 2016-05-14 DIAGNOSIS — Z818 Family history of other mental and behavioral disorders: Secondary | ICD-10-CM | POA: Insufficient documentation

## 2016-05-14 DIAGNOSIS — D649 Anemia, unspecified: Secondary | ICD-10-CM | POA: Insufficient documentation

## 2016-05-14 DIAGNOSIS — K219 Gastro-esophageal reflux disease without esophagitis: Secondary | ICD-10-CM | POA: Insufficient documentation

## 2016-05-14 DIAGNOSIS — E7800 Pure hypercholesterolemia, unspecified: Secondary | ICD-10-CM | POA: Insufficient documentation

## 2016-05-14 HISTORY — PX: CAROTID PTA/STENT INTERVENTION: CATH118231

## 2016-05-14 HISTORY — PX: CAROTID ANGIOGRAPHY: CATH118230

## 2016-05-14 LAB — GLUCOSE, CAPILLARY: GLUCOSE-CAPILLARY: 175 mg/dL — AB (ref 65–99)

## 2016-05-14 LAB — POCT ACTIVATED CLOTTING TIME: Activated Clotting Time: 340 seconds

## 2016-05-14 SURGERY — CAROTID ANGIOGRAPHY
Anesthesia: Moderate Sedation | Laterality: Right

## 2016-05-14 MED ORDER — FENTANYL CITRATE (PF) 100 MCG/2ML IJ SOLN
INTRAMUSCULAR | Status: DC | PRN
  Administered 2016-05-14: 25 ug via INTRAVENOUS
  Administered 2016-05-14: 50 ug via INTRAVENOUS
  Administered 2016-05-14: 25 ug via INTRAVENOUS

## 2016-05-14 MED ORDER — LIDOCAINE HCL (PF) 1 % IJ SOLN
INTRAMUSCULAR | Status: AC
  Filled 2016-05-14: qty 30

## 2016-05-14 MED ORDER — METOPROLOL TARTRATE 5 MG/5ML IV SOLN: 2.0000 mg | INTRAVENOUS | Status: DC | PRN

## 2016-05-14 MED ORDER — HYDRALAZINE HCL 20 MG/ML IJ SOLN: 5.0000 mg | INTRAMUSCULAR | Status: DC | PRN

## 2016-05-14 MED ORDER — GUAIFENESIN-DM 100-10 MG/5ML PO SYRP: 15.0000 mL | ORAL_SOLUTION | ORAL | Status: DC | PRN

## 2016-05-14 MED ORDER — FENTANYL CITRATE (PF) 100 MCG/2ML IJ SOLN
INTRAMUSCULAR | Status: AC
  Filled 2016-05-14: qty 2

## 2016-05-14 MED ORDER — PHENYLEPHRINE HCL 10 MG/ML IJ SOLN
INTRAMUSCULAR | Status: AC
  Filled 2016-05-14: qty 1

## 2016-05-14 MED ORDER — SODIUM CHLORIDE 0.9 % IV SOLN: 1.0000 mL/kg/h | INTRAVENOUS | Status: DC

## 2016-05-14 MED ORDER — HEPARIN SODIUM (PORCINE) 1000 UNIT/ML IJ SOLN
INTRAMUSCULAR | Status: DC | PRN
  Administered 2016-05-14: 7500 [IU] via INTRAVENOUS

## 2016-05-14 MED ORDER — LABETALOL HCL 5 MG/ML IV SOLN: 10.0000 mg | INTRAVENOUS | Status: DC | PRN

## 2016-05-14 MED ORDER — HEPARIN (PORCINE) IN NACL 2-0.9 UNIT/ML-% IJ SOLN
INTRAMUSCULAR | Status: AC
  Filled 2016-05-14: qty 1000

## 2016-05-14 MED ORDER — LIDOCAINE-EPINEPHRINE (PF) 2 %-1:200000 IJ SOLN
INTRAMUSCULAR | Status: AC
  Filled 2016-05-14: qty 20

## 2016-05-14 MED ORDER — SODIUM CHLORIDE 0.9 % IV SOLN: INTRAVENOUS | Status: DC

## 2016-05-14 MED ORDER — MIDAZOLAM HCL 2 MG/2ML IJ SOLN
INTRAMUSCULAR | Status: DC | PRN
  Administered 2016-05-14: 1 mg via INTRAVENOUS
  Administered 2016-05-14: 2 mg via INTRAVENOUS

## 2016-05-14 MED ORDER — HEPARIN SODIUM (PORCINE) 1000 UNIT/ML IJ SOLN
INTRAMUSCULAR | Status: AC
  Filled 2016-05-14: qty 1

## 2016-05-14 MED ORDER — CEFAZOLIN IN D5W 1 GM/50ML IV SOLN: 1.0000 g | Freq: Once | INTRAVENOUS | Status: DC

## 2016-05-14 MED ORDER — CEFAZOLIN IN D5W 1 GM/50ML IV SOLN
1.0000 g | Freq: Once | INTRAVENOUS | Status: AC
  Administered 2016-05-14: 1 g via INTRAVENOUS

## 2016-05-14 MED ORDER — OXYCODONE HCL 5 MG PO TABS: 5.0000 mg | ORAL_TABLET | ORAL | Status: DC | PRN

## 2016-05-14 MED ORDER — IOPAMIDOL (ISOVUE-300) INJECTION 61%
INTRAVENOUS | Status: DC | PRN
  Administered 2016-05-14: 40 mL via INTRA_ARTERIAL

## 2016-05-14 MED ORDER — ACETAMINOPHEN 325 MG PO TABS: 325.0000 mg | ORAL_TABLET | ORAL | Status: DC | PRN

## 2016-05-14 MED ORDER — ONDANSETRON HCL 4 MG/2ML IJ SOLN: 4.0000 mg | Freq: Four times a day (QID) | INTRAMUSCULAR | Status: DC | PRN

## 2016-05-14 MED ORDER — ATROPINE SULFATE 1 MG/10ML IJ SOSY
PREFILLED_SYRINGE | INTRAMUSCULAR | Status: AC
  Filled 2016-05-14: qty 10

## 2016-05-14 MED ORDER — HYDROMORPHONE HCL 1 MG/ML IJ SOLN: 0.5000 mg | INTRAMUSCULAR | Status: DC | PRN

## 2016-05-14 MED ORDER — DOPAMINE-DEXTROSE 3.2-5 MG/ML-% IV SOLN
INTRAVENOUS | Status: AC
  Filled 2016-05-14: qty 250

## 2016-05-14 MED ORDER — MIDAZOLAM HCL 5 MG/5ML IJ SOLN
INTRAMUSCULAR | Status: AC
  Filled 2016-05-14: qty 5

## 2016-05-14 MED ORDER — SODIUM CHLORIDE 0.9 % IV SOLN: 500.0000 mL | Freq: Once | INTRAVENOUS | Status: DC | PRN

## 2016-05-14 MED ORDER — PHENOL 1.4 % MT LIQD: 1.0000 | OROMUCOSAL | Status: DC | PRN

## 2016-05-14 MED ORDER — ALUM & MAG HYDROXIDE-SIMETH 200-200-20 MG/5ML PO SUSP: 15.0000 mL | ORAL | Status: DC | PRN

## 2016-05-14 MED ORDER — ACETAMINOPHEN 325 MG RE SUPP
325.0000 mg | RECTAL | Status: DC | PRN
  Filled 2016-05-14: qty 2

## 2016-05-14 SURGICAL SUPPLY — 14 items
CANNULA 5F STIFF (CANNULA) ×3 IMPLANT
CATH ANGIO 5F 100CM .035 PIG (CATHETERS) ×3 IMPLANT
CATH BEACON 5 .038 100 JB1 TIP (CATHETERS) ×3 IMPLANT
DEVICE SAFEGUARD 24CM (GAUZE/BANDAGES/DRESSINGS) ×3 IMPLANT
DEVICE STARCLOSE SE CLOSURE (Vascular Products) ×3 IMPLANT
DEVICE TORQUE (MISCELLANEOUS) ×3 IMPLANT
GLIDEWIRE ANGLED SS 035X260CM (WIRE) ×3 IMPLANT
GUIDEWIRE AMPLATZ SS .035X260 (WIRE) ×3 IMPLANT
KIT CAROTID MANIFOLD (MISCELLANEOUS) ×3 IMPLANT
PACK ANGIOGRAPHY (CUSTOM PROCEDURE TRAY) ×3 IMPLANT
SHEATH BRITE TIP 6FRX11 (SHEATH) ×3 IMPLANT
SHEATH SHUTTLE SELECT 6F (SHEATH) ×3 IMPLANT
TOWEL OR 17X26 4PK STRL BLUE (TOWEL DISPOSABLE) ×3 IMPLANT
WIRE J 3MM .035X145CM (WIRE) ×3 IMPLANT

## 2016-05-14 NOTE — Progress Notes (Signed)
Patient's vitals have remained stable. Pad device remains intact,have attempted to take air from pad, however patient cont's to ooze from site. Pressure held for 73mn's after which pad device replaced and intact.spoke with md, who said to hold pressure. No hematoma visible. Pt denies complaints. Have cont'd over all this time to keep patient flat.

## 2016-05-14 NOTE — Progress Notes (Signed)
Patient clinically stable post procedure,vitals stable, no bleeding nor hematoma at right groin site. Pad pressure device remains on post procedure.will keep device on until close to discharge time. Wife at bedside. Patient awake,alert and oriented. Dr Delana Meyer out to speak at length with wife and patient regarding pt not needing stent placement. Sinus rhythm per monitor. Denies complaints at this time.

## 2016-05-14 NOTE — Discharge Instructions (Signed)

## 2016-05-14 NOTE — Op Note (Signed)
[facility]   OPERATIVE NOTE  DATE: 05/14/2016  PRE-OPERATIVE DIAGNOSIS: 1. right carotid artery stenosis 2. Right hemispheric CVA  POST-OPERATIVE DIAGNOSIS: Same as above  PROCEDURE: 1.   Ultrasound Guidance for vascular access right femoral artery 2.   Catheter placement into right common carotid artery  3.   Arch aortogram 4.   Cervical and cerebral bilateral carotid angiograms 5.   StarClose closure device right femoral artery  SURGEON: Hortencia Pilar, MD  ASSISTANT(S): None  ANESTHESIA: Moderate conscious sedation  ESTIMATED BLOOD LOSS: 10 cc  FLUORO TIME: 3 min  CONTRAST: 65 cc  MODERATE CONSCIOUS SEDATION TIME: Approximately 35 minutes using iv Versed and Fentanyl  FINDING(S): 1.  50% RICA with normal distal right ICA  SPECIMEN(S):  None  INDICATIONS:   Patient is a 77 y.o.male who presents with recent CVA and carotid stenosis. The patient's renal function precluded CT angiogram. Catheter-based angiogram is performed for further evaluation. Risks and benefits are discussed and informed consent was obtained.  DESCRIPTION: After obtaining full informed written consent, the patient was brought back to the operating room and placed supine upon the vascular suite table.  After obtaining adequate anesthesia, the patient was prepped and draped in the standard fashion.  Moderate conscious sedation was administered during a face to face encounter with the patient throughout the procedure with my supervision of the RN administering medicines and monitoring the patients vital signs and mental status throughout from the start of the procedure until the patient was taken to the recovery room. The right femoral artery was visualized with ultrasound and found to be calcific but patent. It was then accessed under direct ultrasound guidance without difficulty with a Seldinger needle. A J-wire and 5 French sheath were placed and a permanent image was recorded.  The patient was given 7500 units of intravenous heparin. A pigtail catheter was placed into the ascending aorta and an LAO projection thoracic aortogram was performed. This showed a type III aortic arch no evidence of ostial stenosis of the great vessels.  I then selected a JB 1 catheter and cannulated the innominate artery advancing into the mid right common carotid artery. Cervical and cerebral carotid angiography were then performed on the right side initially.   These views showed the right common carotid and external carotid artery are widely patent. There appears to be a 50% stenosis at the origin of the right internal carotid artery. This is focal and localized to the origin. The distal internal carotid artery and the Petrie's portion of the internal carotid artery are widely patent. Intracranial arteries for the right appear normal. Based on this finding intervention is not indicated at this time and therefore stenting is not performed.   Oblique arteriogram was performed of the right femoral artery and StarClose closure device was deployed in usual fashion with excellent hemostatic result. The patient tolerated the procedure well and was taken to the recovery room in stable condition.  COMPLICATIONS: None  CONDITION: Stable   Hortencia Pilar 05/14/2016 2:02 PM   This note was created with Dragon Medical transcription system. Any errors in dictation are purely unintentional.

## 2016-05-14 NOTE — H&P (Signed)
[facility] History & Physical Update  The patient was interviewed and re-examined.  The patient's previous History and Physical has been reviewed and is unchanged.  There is no change in the plan of care. We plan to proceed with the scheduled procedure.  Hortencia Pilar, MD  05/14/2016, 11:48 AM

## 2016-05-14 NOTE — Progress Notes (Signed)
10 ML air  Expelled from dressing ion rt groin with immed fresh bloody oozing noted, reinflated with 10 ml air.

## 2016-05-14 NOTE — Progress Notes (Signed)
5 ml air expelled from rt groin dressing with immed fresh blood oozing, reinflated with 5 ml air

## 2016-05-19 ENCOUNTER — Encounter: Payer: Self-pay | Admitting: Vascular Surgery

## 2016-05-22 ENCOUNTER — Other Ambulatory Visit: Payer: Self-pay | Admitting: Family Medicine

## 2016-05-22 ENCOUNTER — Telehealth: Payer: Self-pay | Admitting: Family Medicine

## 2016-05-22 DIAGNOSIS — I10 Essential (primary) hypertension: Secondary | ICD-10-CM

## 2016-05-22 MED ORDER — LISINOPRIL 30 MG PO TABS: ORAL_TABLET | ORAL | 3 refills | Status: DC

## 2016-05-22 MED ORDER — ATORVASTATIN CALCIUM 40 MG PO TABS: 40.0000 mg | ORAL_TABLET | Freq: Every day | ORAL | 3 refills | Status: DC

## 2016-05-22 NOTE — Telephone Encounter (Signed)
Pt needs refills on atorvastatin and lisinopril 90 day supply sent to [facility].  His call back number is [phone]

## 2016-05-22 NOTE — Telephone Encounter (Signed)
Done.-jh

## 2016-06-05 ENCOUNTER — Encounter (INDEPENDENT_AMBULATORY_CARE_PROVIDER_SITE_OTHER): Payer: PPO | Admitting: Vascular Surgery

## 2016-06-18 LAB — BASIC METABOLIC PANEL
BUN: 12 mg/dL (ref 4–21)
CREATININE: 1.1 mg/dL (ref 0.6–1.3)
Glucose: 260 mg/dL
POTASSIUM: 4.1 mmol/L (ref 3.4–5.3)
Sodium: 140 mmol/L (ref 137–147)

## 2016-06-18 LAB — CBC AND DIFFERENTIAL
HCT: 43 % (ref 41–53)
Hemoglobin: 14.6 g/dL (ref 13.5–17.5)
PLATELETS: 213 10*3/uL (ref 150–399)
WBC: 8.2 10*3/mL

## 2016-06-18 LAB — LIPID PANEL
CHOLESTEROL: 130 mg/dL (ref 0–200)
HDL: 60 mg/dL (ref 35–70)
LDL Cholesterol: 64 mg/dL
Triglycerides: 152 mg/dL (ref 40–160)

## 2016-06-18 LAB — TSH: TSH: 1.45 u[IU]/mL (ref 0.41–5.90)

## 2016-06-18 LAB — HEMOGLOBIN A1C: HEMOGLOBIN A1C: 7.9

## 2016-07-15 ENCOUNTER — Encounter: Payer: Self-pay | Admitting: Nurse Practitioner

## 2016-07-15 ENCOUNTER — Other Ambulatory Visit: Payer: Self-pay | Admitting: Nurse Practitioner

## 2016-07-15 ENCOUNTER — Ambulatory Visit (INDEPENDENT_AMBULATORY_CARE_PROVIDER_SITE_OTHER): Payer: PPO | Admitting: Nurse Practitioner

## 2016-07-15 VITALS — BP 130/72 | HR 80 | Temp 98.6°F | Resp 16 | Ht 69.0 in | Wt 144.8 lb

## 2016-07-15 DIAGNOSIS — S88919A Complete traumatic amputation of unspecified lower leg, level unspecified, initial encounter: Secondary | ICD-10-CM

## 2016-07-15 DIAGNOSIS — J432 Centrilobular emphysema: Secondary | ICD-10-CM

## 2016-07-15 DIAGNOSIS — Z0000 Encounter for general adult medical examination without abnormal findings: Secondary | ICD-10-CM

## 2016-07-15 LAB — HEPATITIS C ANTIBODY

## 2016-07-15 MED ORDER — ALBUTEROL SULFATE HFA 108 (90 BASE) MCG/ACT IN AERS: 2.0000 | INHALATION_SPRAY | Freq: Four times a day (QID) | RESPIRATORY_TRACT | 3 refills | Status: DC | PRN

## 2016-07-15 MED ORDER — BUDESONIDE-FORMOTEROL FUMARATE 160-4.5 MCG/ACT IN AERO: 2.0000 | INHALATION_SPRAY | Freq: Two times a day (BID) | RESPIRATORY_TRACT | 2 refills | Status: DC

## 2016-07-15 MED ORDER — IPRATROPIUM BROMIDE 0.03 % NA SOLN: 2.0000 | Freq: Every day | NASAL | 1 refills | Status: DC

## 2016-07-15 NOTE — Patient Instructions (Addendum)
Stanley Mclean , Thank you for taking time to come for your Medicare Wellness Visit. I appreciate your ongoing commitment to your health goals. Please review the following plan we discussed and let me know if I can assist you in the future.   These are the goals we discussed: Goals    . Increase physical activity          Work toward replacing the portions of your prosthesis that are broken so you can be more physically active.    . Reduce sugar intake           Eliminate most desserts from your diet.       This is a list of the screening recommended for you and due dates:  Health Maintenance  Topic Date Due  . Complete foot exam   11/05/1950  . Pneumonia vaccines (2 of 2 - PPSV23) 12/07/2014  . Eye exam for diabetics  08/09/2016  . Hemoglobin A1C  09/27/2016  . Flu Shot  10/15/2016  . Colon Cancer Screening  03/18/2019  . Tetanus Vaccine  01/04/2025    Please schedule a follow-up appointment with Cassell Smiles, AGNP in 2 months.  If you have any other questions or concerns, please feel free to call the clinic or send a message through [place]. You may also schedule an earlier appointment if necessary.  Cassell Smiles, DNP, AGNP-BC Adult Gerontology Nurse Practitioner [place]

## 2016-07-15 NOTE — Progress Notes (Signed)
Subjective:    Patient ID: Stanley Mclean, male    DOB: May 16, 1940, 76 y.o.   MRN: [id number]  Stanley Mclean is a 76 y.o. male presenting on 07/15/2016 for Medicare Wellness   HPI  Stanley Mclean states he is transferring his care from his PCP with the [facility].  He also needs to have his medicare wellness visit completed for the calendar year today.  He has had health changes that include a CVA in January 2018 and a right carotid artery stent placement in February.   Health Maintenance: 50 pack year smoking history: Pt. declines lung cancer low dose CT scan in hx of smoking Eye exam: he states he is due for appointment - pt will schedule at [facility] His last Hemoglobin A1c at the [place] was done 06/18/2016: Hgb A1c = 7.9%   Health Goals: 1. Work to improve glucose control with diet and exercise.  Pt states he will consider medication if lifestyle is insufficient. 2. Repair prosthesis for increasing physical activity.  Patient is requesting a replacement for the suction liner, inner socket and laminated frame.  His frame and inner socket are cracked and it makes his activity difficult.  He is only able to wear it for about 1 hour at a time right now.  Home BP is usually around 130/80, but has been elevated today at home as well.    Past Medical History:  Diagnosis Date  . Anemia   . BPH (benign prostatic hyperplasia)   . CAD (coronary artery disease)    a. s/p multiple prior PCIs b. s/p DES-distal RCA 05/2013  . COPD (chronic obstructive pulmonary disease) ([place])   . GERD (gastroesophageal reflux disease)   . Macular degeneration   . Pure hypercholesterolemia   . Stroke ([place])   . Tobacco abuse   . Traumatic amputation of leg(s) (complete) (partial), unilateral, level not specified, without mention of complication 50/56/7889   Right Below Knee Amputation  . Type II or unspecified type diabetes mellitus without mention of complication, not stated as uncontrolled   .  Unspecified essential hypertension    Past Surgical History:  Procedure Laterality Date  . AMPUTATION Right 08/26/2011   Below Knee Amputee  . APPENDECTOMY    . CARDIAC CATHETERIZATION    . CAROTID ANGIOGRAPHY Right 05/14/2016   Procedure: Carotid Angiography;  Surgeon: Katha Cabal, MD;  Location: [place] [facility];  Service: Cardiovascular;  Laterality: Right;  . CAROTID PTA/STENT INTERVENTION  05/14/2016   Procedure: Carotid PTA/Stent Intervention;  Surgeon: Katha Cabal, MD;  Location: [place] [facility];  Service: Cardiovascular;;  . CORONARY ANGIOPLASTY WITH STENT PLACEMENT  09/28/2006   RCA  . CORONARY ANGIOPLASTY WITH STENT PLACEMENT  07/21/2006   Mid LAD  . CORONARY ANGIOPLASTY WITH STENT PLACEMENT  06/06/2013   30% prox LAD at the site of a prior stent, 40% mid LAD at the origin of D1, 40% ostial D1, 30% prox LCx at the site of a prior stent, in the proximal third of the vessel segment, 20% prox RCA, 99% distal RCA s/p DES. EF >55%.   Social History   Social History  . Marital status: Married    Spouse name: N/A  . Number of children: N/A  . Years of education: N/A   Occupational History  . Not on file.   Social History Main Topics  . Smoking status: Current Every Day Smoker    Packs/day: 1.00    Years: 50.00  Types: Cigarettes    Last attempt to quit: 03/29/2016  . Smokeless tobacco: Never Used  . Alcohol use No  . Drug use: No  . Sexual activity: No   Other Topics Concern  . Not on file   Social History Narrative  . No narrative on file   Family History  Problem Relation Age of Onset  . Mental illness Mother    Current Outpatient Prescriptions on File Prior to Visit  Medication Sig  . albuterol (PROVENTIL HFA;VENTOLIN HFA) 108 (90 BASE) MCG/ACT inhaler Inhale 2 puffs into the lungs every 6 (six) hours as needed for wheezing or shortness of breath.  Marland Kitchen amLODipine (NORVASC) 10 MG tablet Take 1 tablet (10 mg total) by mouth daily.  Marland Kitchen aspirin 81  MG tablet Take 81 mg by mouth daily.  Marland Kitchen atorvastatin (LIPITOR) 40 MG tablet Take 1 tablet (40 mg total) by mouth daily.  . budesonide-formoterol (SYMBICORT) 160-4.5 MCG/ACT inhaler Inhale 2 puffs into the lungs 2 (two) times daily.  . clopidogrel (PLAVIX) 75 MG tablet Take 1 tablet (75 mg total) by mouth daily.  Marland Kitchen ipratropium (ATROVENT) 0.03 % nasal spray Place 2 sprays into both nostrils daily.   Marland Kitchen lisinopril (PRINIVIL,ZESTRIL) 30 MG tablet TAKE ONE (1) TABLET EACH DAY  . metoprolol (LOPRESSOR) 50 MG tablet Take 1 tablet (50 mg total) by mouth 2 (two) times daily.  . nitroGLYCERIN (NITROSTAT) 0.4 MG SL tablet Place 1 tablet (0.4 mg total) under the tongue every 5 (five) minutes as needed for chest pain.  Marland Kitchen terazosin (HYTRIN) 1 MG capsule Take 1 capsule (1 mg total) by mouth at bedtime.   Current Facility-Administered Medications on File Prior to Visit  Medication  . ceFAZolin (ANCEF) IVPB 1 g/50 mL premix    Review of Systems  Constitutional: Negative.   HENT: Negative.   Eyes: Negative.   Respiratory: Positive for cough, chest tightness, shortness of breath and wheezing.        None currently  Cardiovascular: Negative.   Gastrointestinal: Negative.   Endocrine: Negative.   Genitourinary: Negative.   Musculoskeletal: Positive for arthralgias.       Right bka with prosthetic leg/foot  Skin: Negative.   Allergic/Immunologic: Negative.   Neurological: Negative.   Hematological: Negative.   Psychiatric/Behavioral: Negative.    Per HPI unless specifically indicated above      Objective:    BP (!) 155/67 (BP Location: Left Arm, Patient Position: Sitting, Cuff Size: Normal)   Pulse 80   Temp 98.6 F (37 C) (Oral)   Resp 16   Ht _0  (1.753 m)   Wt 144 lb 12.8 oz (65.7 kg)   SpO2 96%   BMI 21.38 kg/m    BP recheck: 130/72  Wt Readings from Last 3 Encounters:  07/15/16 144 lb 12.8 oz (65.7 kg)  05/14/16 150 lb (68 kg)  05/12/16 150 lb (68 kg)    Physical Exam    Constitutional: He is oriented to person, place, and time. He appears well-developed and well-nourished. No distress.  HENT:  Head: Normocephalic and atraumatic.  Right Ear: External ear normal.  Left Ear: External ear normal.  Nose: Nose normal.  Mouth/Throat: Oropharynx is clear and moist.  Eyes: Conjunctivae and EOM are normal. Pupils are equal, round, and reactive to light.  Neck: Normal range of motion. Neck supple. No JVD present. No tracheal deviation present.  Negative carotid bruits  Cardiovascular: Normal rate, regular rhythm and intact distal pulses.   Pulmonary/Chest: Effort normal. No respiratory  distress.  Clear, diminished breath sounds in all lobes.  Decreased air movement.  Musculoskeletal: Normal range of motion.  Lymphadenopathy:    He has no cervical adenopathy.  Neurological: He is alert and oriented to person, place, and time.  Skin: Skin is warm and dry.  Psychiatric: He has a normal mood and affect. His behavior is normal. Judgment and thought content normal.       Assessment & Plan:   Problem List Items Addressed This Visit    None    Visit Diagnoses    Medicare annual wellness visit, subsequent    -  Primary      Reviewed patient's Family Medical History Reviewed and updated list of patient's medical providers Assessment of cognitive impairment was done Assessed patient's functional ability Established a written schedule for health screening [place] Completed and Reviewed  Exercise Activities and Dietary recommendations Goals    . Increase physical activity          Work toward replacing the portions of your prosthesis that are broken so you can be more physically active.    . Reduce sugar intake           Eliminate most desserts from your diet.       There is no immunization history on file for this patient.  Health Maintenance  Topic Date Due  . FOOT EXAM  11/05/1952  . PNA vac Low Risk Adult (2 of 2 -  PPSV23) 12/07/2014  . OPHTHALMOLOGY EXAM  08/09/2016  . HEMOGLOBIN A1C  09/27/2016  . INFLUENZA VACCINE  10/15/2016  . COLONOSCOPY  03/18/2019  . TETANUS/TDAP  01/04/2025    Discussed health benefits of physical activity, and encouraged him to engage in regular exercise appropriate for his age and condition.    Current Outpatient Prescriptions:  .  albuterol (PROVENTIL HFA;VENTOLIN HFA) 108 (90 BASE) MCG/ACT inhaler, Inhale 2 puffs into the lungs every 6 (six) hours as needed for wheezing or shortness of breath., Disp: , Rfl:  .  amLODipine (NORVASC) 10 MG tablet, Take 1 tablet (10 mg total) by mouth daily., Disp: 90 tablet, Rfl: 3 .  aspirin 81 MG tablet, Take 81 mg by mouth daily., Disp: , Rfl:  .  atorvastatin (LIPITOR) 40 MG tablet, Take 1 tablet (40 mg total) by mouth daily., Disp: 90 tablet, Rfl: 3 .  budesonide-formoterol (SYMBICORT) 160-4.5 MCG/ACT inhaler, Inhale 2 puffs into the lungs 2 (two) times daily., Disp: , Rfl:  .  clopidogrel (PLAVIX) 75 MG tablet, Take 1 tablet (75 mg total) by mouth daily., Disp: 90 tablet, Rfl: 3 .  ipratropium (ATROVENT) 0.03 % nasal spray, Place 2 sprays into both nostrils daily. , Disp: , Rfl:  .  lisinopril (PRINIVIL,ZESTRIL) 30 MG tablet, TAKE ONE (1) TABLET EACH DAY, Disp: 90 tablet, Rfl: 3 .  metoprolol (LOPRESSOR) 50 MG tablet, Take 1 tablet (50 mg total) by mouth 2 (two) times daily., Disp: 60 tablet, Rfl: 2 .  nitroGLYCERIN (NITROSTAT) 0.4 MG SL tablet, Place 1 tablet (0.4 mg total) under the tongue every 5 (five) minutes as needed for chest pain., Disp: 25 tablet, Rfl: 0 .  terazosin (HYTRIN) 1 MG capsule, Take 1 capsule (1 mg total) by mouth at bedtime., Disp: 90 capsule, Rfl: 3  Current Facility-Administered Medications:  .  ceFAZolin (ANCEF) IVPB 1 g/50 mL premix, 1 g, Intravenous, Once, Katha Cabal, MD There are no discontinued medications.  Next Medicare Wellness Visit in 12+ months  Follow up plan: Return in about 2  months (around 09/14/2016) for diabetes.  Cassell Smiles, DNP, AGPCNP-BC Adult Gerontology Primary Care Nurse Practitioner [facility] 07/15/2016, 2:14 PM

## 2016-07-15 NOTE — Progress Notes (Signed)
I have reviewed this encounter including the documentation in this note and/or discussed this patient with the provider, Cassell Smiles, AGPCNP-BC. I am certifying that I agree with the content of this note as supervising physician.  Nobie Putnam, DO [facility] 07/15/2016, 9:31 PM

## 2016-07-28 ENCOUNTER — Telehealth: Payer: Self-pay | Admitting: Family Medicine

## 2016-07-28 DIAGNOSIS — J432 Centrilobular emphysema: Secondary | ICD-10-CM

## 2016-07-28 MED ORDER — BUDESONIDE-FORMOTEROL FUMARATE 160-4.5 MCG/ACT IN AERO: 2.0000 | INHALATION_SPRAY | Freq: Two times a day (BID) | RESPIRATORY_TRACT | 1 refills | Status: DC

## 2016-07-28 MED ORDER — ALBUTEROL SULFATE HFA 108 (90 BASE) MCG/ACT IN AERS
2.0000 | INHALATION_SPRAY | Freq: Four times a day (QID) | RESPIRATORY_TRACT | 1 refills | Status: DC | PRN
Start: 2016-07-28 — End: 2016-07-29

## 2016-07-28 NOTE — Telephone Encounter (Signed)
Pt needs refills on proair, symbicort and ipratropium sent to [facility].  His call back number is [phone]

## 2016-07-28 NOTE — Telephone Encounter (Signed)
Ipratropium nasal spray is for short term use.  That refill was not provided.  Refills were sent for his inhalers to provide a 90-day supply.  Will need a follow up appointment for COPD for these to be refilled.

## 2016-07-29 ENCOUNTER — Other Ambulatory Visit: Payer: Self-pay

## 2016-07-29 DIAGNOSIS — J432 Centrilobular emphysema: Secondary | ICD-10-CM

## 2016-07-29 MED ORDER — ALBUTEROL SULFATE HFA 108 (90 BASE) MCG/ACT IN AERS: 2.0000 | INHALATION_SPRAY | Freq: Four times a day (QID) | RESPIRATORY_TRACT | 1 refills | Status: DC | PRN

## 2016-07-29 MED ORDER — BUDESONIDE-FORMOTEROL FUMARATE 160-4.5 MCG/ACT IN AERO: 2.0000 | INHALATION_SPRAY | Freq: Two times a day (BID) | RESPIRATORY_TRACT | 1 refills | Status: DC

## 2016-07-29 MED ORDER — IPRATROPIUM BROMIDE 0.03 % NA SOLN: 2.0000 | Freq: Every day | NASAL | 0 refills | Status: DC

## 2016-07-29 NOTE — Telephone Encounter (Signed)
Pt called back with fax number to [facility] to send prescriptions [phone]

## 2016-07-29 NOTE — Telephone Encounter (Signed)
Please send symbicort and ProventilVentolin inhaler to  [facility] via fax number listed [phone]).    Pt stated he will need a referral to [facility] for his prosthesis.  His prior referral was sent to a requested provider that is out of network.  He will call back with information for the specific provider he needs to see at the [place].

## 2016-07-29 NOTE — Telephone Encounter (Signed)
Rx faxed

## 2016-07-29 NOTE — Telephone Encounter (Signed)
Called patient reg his refill pt still didn't receive any med's and from Epic [facility] [place], I requested to give me a number for his pharmacy so I can ask and request for his med's but patient's reply that we should have number for his pharmacy and this should have been taken care 15 days ago it's hard for him to walk around and get number for his [facility] that is in [place] not in [place] and requested that His provider can call him back.

## 2016-07-30 ENCOUNTER — Telehealth: Payer: Self-pay | Admitting: Nurse Practitioner

## 2016-07-30 NOTE — Telephone Encounter (Signed)
Pt.called  States that meds was not called in  [place]  (main number) [phone],   [phone]. Pt.also requested that someone call him when they follow up on prescription

## 2016-07-31 NOTE — Telephone Encounter (Signed)
Called pharmacy yesterday his S.S # were not matching and for privacy they couldn't give me more information trying to give verbal but they don't take verbal and fax were not going through either called multiple time to [facility] for this patient yesterday and pharmacy were closed after 4:00 pm and once again trying call pharmacy and fax is still not going through their pharmacy don't take calls but call center nurse can only see Albuterol.

## 2016-08-17 NOTE — Progress Notes (Signed)
Cardiology Office Note  Date:  08/19/2016   ID:  Stanley Mclean, Stanley Mclean 1940-03-25, MRN [id number]  PCP:  Mikey College, NP   Chief Complaint  Patient presents with  . OTHER    6 month f/u no complaints today. Meds reviewed verbally with pt.    HPI:  Stanley Mclean is a pleasant 76 year old gentleman with a  DM II long smoking history who continues to smoke one pack per day,  coronary artery disease, stent to his mid LAD, 2 stents to  proximal LCX in May 2008, stent  proximal RCA July 2008, previous anginal symptoms  History of amputation of the right lower extremity from lawnmower accident 2 years ago who presents for routine followup of his coronary artery disease  Lab work reviewed with him on today's visit HBA1C 7.9 Was on metformin, reports he no longer takes this  Reviewed recent hospitalization records with him in detail CVA  03/2016  left arm deficits, took about a week to get better Still only at 80% to 90% in terms of left hand strength  MRI results as below Acute 1 cm nonhemorrhagic deep white matter infarct, RIGHT hemisphere. Atrophy and small vessel disease. Apparent high-grade stenosis of the vertical petrous segment RIGHT ICA, likely focal atherosclerosis. Intracranial atherosclerotic disease, most notably affecting the RIGHT greater than LEFT M2 and M3 MCA branches.  CT neck also reviewed with him Plaque right carotid bifurcation with 64% maximal diameter stenosis at the junction of the right common carotid artery and proximal right internal carotid artery.  Plaque pre cavernous and cavernous segment right internal carotid artery with moderate narrowing.  Calcified plaque left carotid bifurcation. Calcified and noncalcified plaque proximal left internal carotid artery. Less than 60% diameter stenosis.  Calcified plaque left internal carotid artery cavernous segment with mild narrowing.  Cath by Dr. Ronalee Belts performed showing   50% RICA with normal  distal right ICA  Blood pressure stable Denies any breathing exacerbations No other TIA or stroke symptoms Denies any palpitations concerning for arrhythmia No significant chest pain Chest pain on previous office visit, declined cardiac catheterizationat that time Continues to smoke more than one pack per day  EKG personally reviewed by myself on todays visit Shows normal sinus rhythm with rate 68 bpm consider old anterior MI  Other past medical history  cardiac catheterization 06/06/2013, found to have severe distal RCA disease, mild in-stent restenosis in the circumflex and LAD, normal ejection fraction. 40% mid LAD disease, 40% B1 disease, 30% proximal circumflex also noted. He had Xience stent 2.5 x 15 mm placed in the distal RCA.   History of neuropathy.   PMH:   has a past medical history of Anemia; BPH (benign prostatic hyperplasia); CAD (coronary artery disease); COPD (chronic obstructive pulmonary disease) ([place]); GERD (gastroesophageal reflux disease); Macular degeneration; Pure hypercholesterolemia; Stroke ([place]); Tobacco abuse; Traumatic amputation of leg(s) (complete) (partial), unilateral, level not specified, without mention of complication (16/09/3708); Type II or unspecified type diabetes mellitus without mention of complication, not stated as uncontrolled; and Unspecified essential hypertension.  PSH:    Past Surgical History:  Procedure Laterality Date  . AMPUTATION Right 08/26/2011   Below Knee Amputee  . APPENDECTOMY    . CARDIAC CATHETERIZATION    . CAROTID ANGIOGRAPHY Right 05/14/2016   Procedure: Carotid Angiography;  Surgeon: Katha Cabal, MD;  Location: [place] [facility];  Service: Cardiovascular;  Laterality: Right;  . CAROTID PTA/STENT INTERVENTION  05/14/2016   Procedure: Carotid PTA/Stent Intervention;  Surgeon: Belenda Cruise  Eloise Levels, MD;  Location: [place] [facility];  Service: Cardiovascular;;  . CORONARY ANGIOPLASTY WITH STENT PLACEMENT   09/28/2006   RCA  . CORONARY ANGIOPLASTY WITH STENT PLACEMENT  07/21/2006   Mid LAD  . CORONARY ANGIOPLASTY WITH STENT PLACEMENT  06/06/2013   30% prox LAD at the site of a prior stent, 40% mid LAD at the origin of D1, 40% ostial D1, 30% prox LCx at the site of a prior stent, in the proximal third of the vessel segment, 20% prox RCA, 99% distal RCA s/p DES. EF >55%.    Current Outpatient Prescriptions  Medication Sig Dispense Refill  . albuterol (PROVENTIL HFA;VENTOLIN HFA) 108 (90 Base) MCG/ACT inhaler Inhale 2 puffs into the lungs every 6 (six) hours as needed for wheezing or shortness of breath. 3 Inhaler 1  . amLODipine (NORVASC) 10 MG tablet Take 1 tablet (10 mg total) by mouth daily. (Patient taking differently: Take 10 mg by mouth. ) 90 tablet 3  . aspirin 81 MG tablet Take 81 mg by mouth daily.    Marland Kitchen atorvastatin (LIPITOR) 40 MG tablet Take 1 tablet (40 mg total) by mouth daily. 90 tablet 3  . budesonide-formoterol (SYMBICORT) 160-4.5 MCG/ACT inhaler Inhale 2 puffs into the lungs 2 (two) times daily. 3 Inhaler 1  . clopidogrel (PLAVIX) 75 MG tablet Take 1 tablet (75 mg total) by mouth daily. 90 tablet 3  . ipratropium (ATROVENT) 0.03 % nasal spray Place 2 sprays into both nostrils daily. 30 mL 0  . lisinopril (PRINIVIL,ZESTRIL) 30 MG tablet TAKE ONE (1) TABLET EACH DAY 90 tablet 3  . metoprolol (LOPRESSOR) 50 MG tablet Take 1 tablet (50 mg total) by mouth 2 (two) times daily. 60 tablet 2  . nitroGLYCERIN (NITROSTAT) 0.4 MG SL tablet Place 1 tablet (0.4 mg total) under the tongue every 5 (five) minutes as needed for chest pain. 25 tablet 0  . terazosin (HYTRIN) 1 MG capsule Take 1 capsule (1 mg total) by mouth at bedtime. 90 capsule 3   Current Facility-Administered Medications  Medication Dose Route Frequency Provider Last Rate Last Dose  . ceFAZolin (ANCEF) IVPB 1 g/50 mL premix  1 g Intravenous Once Schnier, Dolores Lory, MD         Allergies:   Patient has no known allergies.    Social History:  The patient  reports that he has been smoking Cigarettes.  He has a 50.00 pack-year smoking history. He has never used smokeless tobacco. He reports that he does not drink alcohol or use drugs.   Family History:   family history includes Mental illness in his mother.    Review of Systems: Review of Systems  Constitutional: Negative.   Respiratory: Negative.   Cardiovascular: Negative.   Gastrointestinal: Negative.   Musculoskeletal: Negative.   Neurological: Negative.        Left hand weakness  Psychiatric/Behavioral: Negative.   All other systems reviewed and are negative.    PHYSICAL EXAM: VS:  BP 120/60 (BP Location: Left Arm, Patient Position: Sitting, Cuff Size: Normal)   Pulse 68   Ht _0  (1.753 m)   Wt 143 lb (64.9 kg)   BMI 21.12 kg/m  , BMI Body mass index is 21.12 kg/m. GEN: Well nourished, well developed, in no acute distress  HEENT: normal  Neck: no JVD,+  carotid bruit on the left 1/2,  no masses Cardiac: RRR; no murmurs, rubs, or gallops, trace edema  Above the sock line left lower extremity Respiratory: Decreased  breath sounds moderately throughout,  normal work of breathing GI: soft, nontender, nondistended, + BS MS: no deformity or atrophy ,  Amputation right lower extremity below the knee Skin: warm and dry, no rash Neuro:  Strength and sensation are intact Psych: euthymic mood, full affect    Recent Labs: 03/30/2016: ALT 15 06/18/2016: BUN 12; Creatinine 1.1; Hemoglobin 14.6; Platelets 213; Potassium 4.1; Sodium 140; TSH 1.45    Lipid Panel Lab Results  Component Value Date   CHOL 130 06/18/2016   HDL 60 06/18/2016   LDLCALC 64 06/18/2016   TRIG 152 06/18/2016      Wt Readings from Last 3 Encounters:  08/19/16 143 lb (64.9 kg)  07/15/16 144 lb 12.8 oz (65.7 kg)  05/14/16 150 lb (68 kg)       ASSESSMENT AND PLAN:  SOB (shortness of breath) - Plan: EKG 12-Lead Chronic shortness of breath, stable. Recommended  smoking cessation  Coronary artery disease involving native coronary artery of native heart with angina pectoris ([place]) - Plan: EKG 12-Lead Currently with no symptoms of angina. No further workup at this time. Continue current medication regimen.  Essential hypertension Blood pressure is well controlled on today's visit. No changes made to the medications.  Smoker We have encouraged him to continue to work on weaning his cigarettes and smoking cessation. He will continue to work on this and does not want any assistance with chantix.   Controlled type 2 diabetes mellitus without complication, without long-term current use of insulin (HCC) Hemoglobin A1c 7.9,  diet-controlled  he will follow-up with primary care  For medication management  CVA  diffuse atherosclerosis  recommended aggressive diabetes control, smoking cessation  no strong indication of arrhythmia    Total encounter time more than 25 minutes  Greater than 50% was spent in counseling and coordination of care with the patient   Disposition:   F/U  6 months   Orders Placed This Encounter  Procedures  . EKG 12-Lead     Signed, Esmond Plants, M.D., Ph.D. 08/19/2016  [place], [place]

## 2016-08-19 ENCOUNTER — Ambulatory Visit (INDEPENDENT_AMBULATORY_CARE_PROVIDER_SITE_OTHER): Payer: PPO | Admitting: Cardiovascular Disease

## 2016-08-19 ENCOUNTER — Encounter: Payer: Self-pay | Admitting: Cardiovascular Disease

## 2016-08-19 VITALS — BP 120/60 | HR 68 | Ht 69.0 in | Wt 143.0 lb

## 2016-08-19 DIAGNOSIS — F17200 Nicotine dependence, unspecified, uncomplicated: Secondary | ICD-10-CM

## 2016-08-19 DIAGNOSIS — R0602 Shortness of breath: Secondary | ICD-10-CM

## 2016-08-19 DIAGNOSIS — I63239 Cerebral infarction due to unspecified occlusion or stenosis of unspecified carotid arteries: Secondary | ICD-10-CM | POA: Diagnosis not present

## 2016-08-19 DIAGNOSIS — E1159 Type 2 diabetes mellitus with other circulatory complications: Secondary | ICD-10-CM | POA: Diagnosis not present

## 2016-08-19 DIAGNOSIS — I25118 Atherosclerotic heart disease of native coronary artery with other forms of angina pectoris: Secondary | ICD-10-CM

## 2016-08-19 DIAGNOSIS — I10 Essential (primary) hypertension: Secondary | ICD-10-CM | POA: Diagnosis not present

## 2016-08-19 DIAGNOSIS — J432 Centrilobular emphysema: Secondary | ICD-10-CM

## 2016-08-19 NOTE — Patient Instructions (Signed)
Medication Instructions:   No medication changes made  Labwork:  No new labs needed  Testing/Procedures:  No further testing at this time   I recommend watching educational videos on topics of interest to you at:       [email or web address]  Enter code: [email or web address]    Follow-Up: It was a pleasure seeing you in the office today. Please call us if you have new issues that need to be addressed before your next appt.  [phone]  Your physician wants you to follow-up in: 6 months.  You will receive a reminder letter in the mail two months in advance. If you don't receive a letter, please call our office to schedule the follow-up appointment.  If you need a refill on your cardiac medications before your next appointment, please call your pharmacy.

## 2016-08-22 ENCOUNTER — Other Ambulatory Visit: Payer: Self-pay

## 2016-08-22 DIAGNOSIS — E785 Hyperlipidemia, unspecified: Secondary | ICD-10-CM

## 2016-08-22 DIAGNOSIS — I10 Essential (primary) hypertension: Secondary | ICD-10-CM

## 2016-08-22 DIAGNOSIS — J432 Centrilobular emphysema: Secondary | ICD-10-CM

## 2016-08-22 MED ORDER — IPRATROPIUM BROMIDE 0.03 % NA SOLN: 2.0000 | Freq: Every day | NASAL | 0 refills | Status: DC

## 2016-08-22 MED ORDER — LISINOPRIL 30 MG PO TABS: ORAL_TABLET | ORAL | 3 refills | Status: DC

## 2016-08-22 MED ORDER — ATORVASTATIN CALCIUM 40 MG PO TABS: 40.0000 mg | ORAL_TABLET | Freq: Every day | ORAL | 3 refills | Status: DC

## 2016-09-02 ENCOUNTER — Other Ambulatory Visit: Payer: Self-pay

## 2016-09-02 NOTE — Telephone Encounter (Signed)
Patient called needing refill on Ipratropium 0.06%.  He reported that last time is was sent for 0.03% but he has always been on higher.  Please print rx and you will have to fax to [place].

## 2016-09-02 NOTE — Telephone Encounter (Signed)
If you okay this prescription it needs to be printed so we can manual fax the script.

## 2016-09-03 ENCOUNTER — Telehealth: Payer: Self-pay

## 2016-09-03 DIAGNOSIS — J432 Centrilobular emphysema: Secondary | ICD-10-CM

## 2016-09-03 MED ORDER — IPRATROPIUM BROMIDE 0.03 % NA SOLN: 2.0000 | Freq: Every day | NASAL | 0 refills | Status: DC

## 2016-09-03 NOTE — Telephone Encounter (Signed)
He can have one more fill. He is probably having some rebound congestion from using this for so long.  Work to decrease usage and use saline spray or flonase OTC.  This is only to be used for max of 3 weeks.

## 2016-09-03 NOTE — Telephone Encounter (Signed)
The pt was upset about the refill denial. He said he's been on this medication for years and this is the only thing to help with his stuffy nose. The pt stated if you can't refill this medication then he probably will have to get him another provider.

## 2016-09-03 NOTE — Addendum Note (Signed)
Addended by: Cleaster Corin on: 09/03/2016 05:37 PM   Modules accepted: Orders

## 2016-09-04 ENCOUNTER — Telehealth: Payer: Self-pay

## 2016-09-04 NOTE — Telephone Encounter (Signed)
Please see previous saved message.

## 2016-09-04 NOTE — Telephone Encounter (Signed)
I spoke w/ the pt and he was very upset that he could not get the Atrovent at 0.06%. I informed him of Lauren recommendation (He can have one more fill at 0.03%.Marland Kitchen He is probably having some rebound congestion from using this for so long.  Work to decrease usage and use saline spray or flonase OTC. This is only to be used for max of 3 weeks) He verbally stated that he read this recommendation on the back of the bottle, but he has been using this for 10 years and this is the only thing that works for him to help with his nasal congestion. He said if he doesn't use this medication that his nose drips constantly. I told him that it's possible a side effect from the continues use of the medication. He hung up the phone on me.

## 2016-10-02 ENCOUNTER — Other Ambulatory Visit: Payer: Self-pay

## 2016-10-02 ENCOUNTER — Telehealth: Payer: Self-pay | Admitting: Cardiovascular Disease

## 2016-10-02 MED ORDER — METOPROLOL TARTRATE 50 MG PO TABS: 50.0000 mg | ORAL_TABLET | Freq: Two times a day (BID) | ORAL | 2 refills | Status: DC

## 2016-10-02 MED ORDER — TERAZOSIN HCL 1 MG PO CAPS: 1.0000 mg | ORAL_CAPSULE | Freq: Every day | ORAL | 3 refills | Status: DC

## 2016-10-02 NOTE — Telephone Encounter (Signed)
Requested Prescriptions   Signed Prescriptions Disp Refills  . terazosin (HYTRIN) 1 MG capsule 90 capsule 3    Sig: Take 1 capsule (1 mg total) by mouth at bedtime.    Authorizing Provider: Minna Merritts    Ordering User: Janan Ridge metoprolol tartrate (LOPRESSOR) 50 MG tablet 60 tablet 2    Sig: Take 1 tablet (50 mg total) by mouth 2 (two) times daily.    Authorizing Provider: Minna Merritts    Ordering User: Janan Ridge

## 2016-10-02 NOTE — Telephone Encounter (Signed)
*  STAT* If patient is at the pharmacy, call can be transferred to refill team.   1. Which medications need to be refilled? (please list name of each medication and dose if known)  Terazosin and Metoprolol   2. Which pharmacy/location (including street and city if local pharmacy) is medication to be sent to? [facility]   3. Do they need a 30 day or 90 day supply? 90 day

## 2016-10-06 DIAGNOSIS — H34832 Tributary (branch) retinal vein occlusion, left eye, with macular edema: Secondary | ICD-10-CM | POA: Diagnosis not present

## 2016-10-07 LAB — HM DIABETES EYE EXAM

## 2016-11-04 ENCOUNTER — Encounter: Payer: Self-pay | Admitting: Family Medicine

## 2016-11-04 ENCOUNTER — Ambulatory Visit (INDEPENDENT_AMBULATORY_CARE_PROVIDER_SITE_OTHER): Payer: PPO | Admitting: Family Medicine

## 2016-11-04 VITALS — BP 146/69 | HR 75 | Temp 98.4°F | Ht 69.0 in | Wt 140.0 lb

## 2016-11-04 DIAGNOSIS — L02214 Cutaneous abscess of groin: Secondary | ICD-10-CM

## 2016-11-04 MED ORDER — DOXYCYCLINE HYCLATE 100 MG PO TABS: 100.0000 mg | ORAL_TABLET | Freq: Two times a day (BID) | ORAL | 0 refills | Status: DC

## 2016-11-04 NOTE — Patient Instructions (Addendum)
Thank you for coming to the clinic today.  1.  You have an abscess, which is a localized bacterial infection in deeper layers of skin.  As discussed today, it did not seem like it would require drainage in office.  Start taking antibiotic as prescribed, Doxycycline 155m twice daily for 10 days, make sure to take with full glass of water and stay upright or seated for 30 min (do not lay down after taking or can cause burning irritation of throat).   Recommend warm soapy water soaks in bathtub or with warm compresses/wash cloth several times a day to help heal and may actually drain some pus on its own, which can be normal.  You may need to return soon for re-evaluation if worsening such as spreading redness or streaking redness, significantly larger size, increased pain, fevers/chills, nausea vomiting and cannot take antibiotic. If significantly worse symptoms or most of these symptoms, would recommend going straight to Hospital Emergency Dept as you may require IV antibiotics instead.  Please schedule a Follow-up Appointment to: Return in about 1 week (around 11/11/2016), or if symptoms worsen or fail to improve, for groin abscess.  If you have any other questions or concerns, please feel free to call the clinic or send a message through [place] You may also schedule an earlier appointment if necessary.  Additionally, you may be receiving a survey about your experience at our clinic within a few days to 1 week by e-mail or mail. We value your feedback.  ANobie Putnam DO [facility]

## 2016-11-04 NOTE — Progress Notes (Signed)
Subjective:    Patient ID: Stanley Mclean, male    DOB: 1940/04/18, 76 y.o.   MRN: [id number]  Stanley Mclean is a 76 y.o. male presenting on 11/04/2016 for Abscess (onset 4-5 days discomfort with ROM)   HPI   GROIN ABSCESS, Right - Reports new problem onset within past 4-5 days with localized swelling and bump in R groin, tender to touch. Has not drained any pus, no spreading redness. He has never had a boil like this before causing abscess. No known trigger or break in skin in this area. No recent antibiotics. - Denies any fevers/chills, extending erythema, drainage of pus, other abscess or skin infection  R Axillary nodule - Additionally reports a smaller non tender nodule in R axilla, present for many months, not worse or resolved.  Social History  Substance Use Topics  . Smoking status: Current Every Day Smoker    Packs/day: 1.00    Years: 50.00    Types: Cigarettes    Last attempt to quit: 03/29/2016  . Smokeless tobacco: Never Used  . Alcohol use No    Review of Systems Per HPI unless specifically indicated above     Objective:    BP (!) 146/69   Pulse 75   Temp 98.4 F (36.9 C) (Oral)   Ht _0  (1.753 m)   Wt 140 lb (63.5 kg)   BMI 20.67 kg/m   Wt Readings from Last 3 Encounters:  11/04/16 140 lb (63.5 kg)  08/19/16 143 lb (64.9 kg)  07/15/16 144 lb 12.8 oz (65.7 kg)    Physical Exam  Constitutional: He is oriented to person, place, and time. He appears well-developed and well-nourished. No distress.  Well-appearing, comfortable, cooperative  HENT:  Head: Normocephalic and atraumatic.  Mouth/Throat: Oropharynx is clear and moist.  Eyes: Conjunctivae are normal. Right eye exhibits no discharge. Left eye exhibits no discharge.  Cardiovascular: Normal rate.   Pulmonary/Chest: Effort normal.  Musculoskeletal: He exhibits no edema.  Neurological: He is alert and oriented to person, place, and time.  Skin: Skin is warm and dry. No rash noted. He is not  diaphoretic. No erythema.  R Groin near scrotal tissue with 3 x 3 cm firm indurated palpable abscess without fluctuance, mild darkening of skin and some localized erythema without extending erythema. No open ulceration or drainage. Testicles non tender.  R axillary 1 x 1 cm firm mobile palpable nodule very superficial has skin pore blackhead, no drainage, non tender.  Psychiatric: He has a normal mood and affect. His behavior is normal.  Well groomed, good eye contact, normal speech and thoughts  Nursing note and vitals reviewed.  Results for orders placed or performed in visit on 10/09/16  HM DIABETES EYE EXAM  Result Value Ref Range   HM Diabetic Eye Exam No Retinopathy No Retinopathy      Assessment & Plan:   Problem List Items Addressed This Visit    None    Visit Diagnoses    Abscess of groin, right    -  Primary  Consistent with new acute R groin abscess with firm induration without surrounding cellulitis or any systemic symptoms. No history of recurrent abcesses in this or other areas, not consistent with chronic suppurativa hidradenitis. No recent antibiotics.  Plan: 1. Considered I&D but deferred since no obvious fluctuance and proximity to scrotal area 2. Start antibiotics instead Doxycycline 122m BID for 10 days, instructed avoid potential esophageal side effect 3. Warm compresses per handout 4. Return  precautions given if worsening within 1 week, return criteria for hospital or return to office, if still not improved but not worsening can consider referral to Gen Surgery vs Urology for I&D     Relevant Medications   doxycycline (VIBRA-TABS) 100 MG tablet      Meds ordered this encounter  Medications  . doxycycline (VIBRA-TABS) 100 MG tablet    Sig: Take 1 tablet (100 mg total) by mouth 2 (two) times daily. For 10 days. Take with full glass of water, stay upright 30 min after taking.    Dispense:  20 tablet    Refill:  0    Follow up plan: Return in about 1  week (around 11/11/2016), or if symptoms worsen or fail to improve, for groin abscess.  Nobie Putnam, [facility] 11/04/2016, 1:46 PM

## 2016-11-19 ENCOUNTER — Ambulatory Visit (INDEPENDENT_AMBULATORY_CARE_PROVIDER_SITE_OTHER): Payer: PPO | Admitting: Nurse Practitioner

## 2016-11-19 ENCOUNTER — Encounter: Payer: Self-pay | Admitting: Nurse Practitioner

## 2016-11-19 VITALS — BP 179/63 | HR 74 | Temp 98.1°F | Ht 69.0 in | Wt 141.2 lb

## 2016-11-19 DIAGNOSIS — L02214 Cutaneous abscess of groin: Secondary | ICD-10-CM | POA: Diagnosis not present

## 2016-11-19 DIAGNOSIS — J432 Centrilobular emphysema: Secondary | ICD-10-CM

## 2016-11-19 DIAGNOSIS — I10 Essential (primary) hypertension: Secondary | ICD-10-CM | POA: Diagnosis not present

## 2016-11-19 DIAGNOSIS — E785 Hyperlipidemia, unspecified: Secondary | ICD-10-CM

## 2016-11-19 MED ORDER — ATORVASTATIN CALCIUM 40 MG PO TABS: 40.0000 mg | ORAL_TABLET | Freq: Every day | ORAL | 1 refills | Status: DC

## 2016-11-19 MED ORDER — ALBUTEROL SULFATE HFA 108 (90 BASE) MCG/ACT IN AERS: 2.0000 | INHALATION_SPRAY | Freq: Four times a day (QID) | RESPIRATORY_TRACT | 1 refills | Status: DC | PRN

## 2016-11-19 MED ORDER — LISINOPRIL 30 MG PO TABS: ORAL_TABLET | ORAL | 1 refills | Status: DC

## 2016-11-19 NOTE — Progress Notes (Signed)
Subjective:    Patient ID: Stanley Mclean, male    DOB: 15-Dec-1940, 76 y.o.   MRN: [id number]  Stanley Mclean is a 76 y.o. male presenting on 11/19/2016 for Abscess (groin area )   HPI Groin abscess on R side Pt was seen 11/04/16 by Dr. Parks Ranger for same concern.  Was placed on antibiotics.  Last antibiotic dose 11/14/16. Pt notes abscess has mostly resolved, but still has a "hard place on it." Denies current pain.  Wound did start draining, but is not draining currently.      Pt states he is "not happy" that his follow up appointment was not scheduled w/ Dr. Parks Ranger.  Explained Dr. Raliegh Ip did not have availability.  Pt agreed to be seen today by me prior to initiation of visit.  COPD Pt has good control of breathing status w/o complaints today.  He has daily shortness of breath and wheezing for which he uses albuterol.  Also takes Symbicort.  Still smokes and does not desire to quit.  Is able to participate in daily physical activity as he desires. States he needs refill on proventil.  He uses 2 puffs 2-3 x daily would like 90-day supply.  His prior supply did not last long enough.  Requests from [facility].  Prosthesis: referral active and pt states he may be able to receive his new prosthetic today.  He has had several fittings so far.  Social History  Substance Use Topics  . Smoking status: Current Every Day Smoker    Packs/day: 1.00    Years: 50.00    Types: Cigarettes    Last attempt to quit: 03/29/2016  . Smokeless tobacco: Never Used  . Alcohol use No    Review of Systems Per HPI unless specifically indicated above     Objective:    BP (!) 179/63 (BP Location: Right Arm, Patient Position: Sitting, Cuff Size: Normal)   Pulse 74   Temp 98.1 F (36.7 C) (Oral)   Ht _0  (1.753 m)   Wt 141 lb 3.2 oz (64 kg)   BMI 20.85 kg/m    Wt Readings from Last 3 Encounters:  11/19/16 141 lb 3.2 oz (64 kg)  11/04/16 140 lb (63.5 kg)  08/19/16 143 lb (64.9 kg)      Physical Exam  General - healthy, well-appearing, NAD HEENT - Normocephalic, atraumatic Neck - supple, non-tender, no LAD Heart - RRR, no murmurs heard Lungs - Clear throughout all lobes, no wheezing, crackles, or rhonchi. Normal work of breathing. Extremeties - non-tender, no edema, cap refill < 2 seconds, peripheral pulses intact +2 bilaterally Right lower leg prosthesis. Skin - warm, dry, without rashes. R groin scrotal tissue with 1 cm x 1 cm firm palpable nodule c/w cyst.  Scab noted over prior abscess.  No current edema, erythema, drainage.  Nontender to palpation.  No inguinal LAD. Neuro - awake, alert, oriented x3 Psych - Agitated mood and affect, withdrawn behavior.  Pt expected his appointment today to be w/ Dr. Parks Ranger.  Results for orders placed or performed in visit on 10/09/16  HM DIABETES EYE EXAM  Result Value Ref Range   HM Diabetic Eye Exam No Retinopathy No Retinopathy      Assessment & Plan:   Problem List Items Addressed This Visit      Cardiovascular and Mediastinum   Essential hypertension    Uncontrolled today.  Pt exhibits anger/irritation that his follow up appointment was not scheduled w/ Dr. Parks Ranger.  Likely cause for elevation as pt describes he was at goal w/ his home check and at most recent MD visits w/ Cardiology.  Plan: 1. No medication changes today.  Partial resolution w/ BP recheck. 2. Follow up w/ Cardiology for med management and w/ PCP in 3 months.      Relevant Medications   atorvastatin (LIPITOR) 40 MG tablet   lisinopril (PRINIVIL,ZESTRIL) 30 MG tablet     Respiratory   COPD (chronic obstructive pulmonary disease) (HCC)    Stable, but would prefer pt not using albuterol daily. Pt unwilling to change current regimen today.  Plan: 1. Refill albuterol.  Calculation for 90-day supply using 3 puffs daily indicates should dispense 6 inhalers.  6 months provided. 2. Follow up in 3 months.      Relevant Medications   albuterol  (PROVENTIL HFA;VENTOLIN HFA) 108 (90 Base) MCG/ACT inhaler     Other   Hyperlipidemia    Controlled on last Lipid check.  Pt requests refill.  Plan: 1. Refill atorvastatin 40 mg once daily. 2. Follow up w/ PCP in 3 months.      Relevant Medications   atorvastatin (LIPITOR) 40 MG tablet   lisinopril (PRINIVIL,ZESTRIL) 30 MG tablet    Other Visit Diagnoses    Abscess of groin, right    -  Primary No evidence of acute infection.  Remaining cyst present, but currently non-painful.    Plan: 1. Reassurance to pt that is still healing.  If changes occur, contact clinic.  If recurrent abscess or infection will need to seek additional treatment for I&D w/ urology. 2. Follow up as needed.      Meds ordered this encounter  Medications  . albuterol (PROVENTIL HFA;VENTOLIN HFA) 108 (90 Base) MCG/ACT inhaler    Sig: Inhale 2 puffs into the lungs every 6 (six) hours as needed for wheezing or shortness of breath.    Dispense:  6 Inhaler    Refill:  1  . atorvastatin (LIPITOR) 40 MG tablet    Sig: Take 1 tablet (40 mg total) by mouth daily.    Dispense:  90 tablet    Refill:  1  . lisinopril (PRINIVIL,ZESTRIL) 30 MG tablet    Sig: TAKE ONE (1) TABLET EACH DAY    Dispense:  90 tablet    Refill:  1      Follow up plan: Return in about 3 months (around 02/18/2017) for Hypertension, COPD, hyperlipidemia.   Cassell Smiles, DNP, AGPCNP-BC Adult Gerontology Primary Care Nurse Practitioner [facility] 11/19/2016, 9:59 AM

## 2016-11-19 NOTE — Patient Instructions (Addendum)
Stanley Mclean, Thank you for coming in to clinic today.  1. For your abscess: - Healing normally.  There is a remaining cyst.  If you have any new drainage, call to have additional follow-up likely with urology.   2. Continue to monitor your blood pressure at home.  Goal is less than 130/80.   Please schedule a follow-up appointment with Cassell Smiles, AGNP or Dr. Raliegh Ip.  Return in about 3 months (around 02/18/2017) for Hypertension, COPD, hyperlipidemia.   If you have any other questions or concerns, please feel free to call the clinic or send a message through [place]. You may also schedule an earlier appointment if necessary.  You will receive a survey after today's visit either digitally by e-mail or paper by [employer]. Your experiences and feedback matter to [place].  Please respond so we know how we are doing as we provide care for you.   Cassell Smiles, DNP, AGNP-BC Adult Gerontology Nurse Practitioner [place]

## 2016-11-21 NOTE — Assessment & Plan Note (Addendum)
Uncontrolled today.  Pt exhibits anger/irritation that his follow up appointment was not scheduled w/ Dr. Parks Ranger.  Likely cause for elevation as pt describes he was at goal w/ his home check and at most recent MD visits w/ Cardiology.  Plan: 1. No medication changes today.  Partial resolution w/ BP recheck. 2. Follow up w/ Cardiology for med management and w/ PCP in 3 months.

## 2016-11-21 NOTE — Assessment & Plan Note (Addendum)
Stable, but would prefer pt not using albuterol daily. Pt unwilling to change current regimen today.  Plan: 1. Refill albuterol.  Calculation for 90-day supply using 3 puffs daily indicates should dispense 6 inhalers.  6 months provided. 2. Follow up in 3 months.

## 2016-11-21 NOTE — Assessment & Plan Note (Addendum)
Controlled on last Lipid check.  Pt requests refill.  Plan: 1. Refill atorvastatin 40 mg once daily. 2. Follow up w/ PCP in 3 months.

## 2016-12-04 ENCOUNTER — Telehealth: Payer: Self-pay | Admitting: Cardiovascular Disease

## 2016-12-04 ENCOUNTER — Other Ambulatory Visit: Payer: Self-pay | Admitting: *Deleted

## 2016-12-04 MED ORDER — METOPROLOL TARTRATE 50 MG PO TABS: 50.0000 mg | ORAL_TABLET | Freq: Two times a day (BID) | ORAL | 2 refills | Status: DC

## 2016-12-04 NOTE — Telephone Encounter (Signed)
Requested Prescriptions   Signed Prescriptions Disp Refills  . metoprolol tartrate (LOPRESSOR) 50 MG tablet 180 tablet 2    Sig: Take 1 tablet (50 mg total) by mouth 2 (two) times daily.    Authorizing Provider: Minna Merritts    Ordering User: Britt Bottom

## 2016-12-04 NOTE — Telephone Encounter (Signed)
*  STAT* If patient is at the pharmacy, call can be transferred to refill team.   1. Which medications need to be refilled? (please list name of each medication and dose if known) metoprolol   2. Which pharmacy/location (including street and city if local pharmacy) is medication to be sent to?[facility]   3. Do they need a 30 day or 90 day supply?  90 day

## 2017-01-01 ENCOUNTER — Telehealth: Payer: Self-pay | Admitting: Cardiovascular Disease

## 2017-01-01 MED ORDER — AMLODIPINE BESYLATE 10 MG PO TABS: 10.0000 mg | ORAL_TABLET | Freq: Every day | ORAL | 3 refills | Status: DC

## 2017-01-01 NOTE — Telephone Encounter (Signed)
*  STAT* If patient is at the pharmacy, call can be transferred to refill team.   1. Which medications need to be refilled? (please list name of each medication and dose if known)   Amlodipine 10 mg po daily   Terazosin 1 mg po qhs  2. Which pharmacy/location (including street and city if local pharmacy) is medication to be sent to? [place]   3. Do they need a 30 day or 90 day supply? [place]

## 2017-01-02 ENCOUNTER — Telehealth: Payer: Self-pay

## 2017-01-02 MED ORDER — AMLODIPINE BESYLATE 10 MG PO TABS: 10.0000 mg | ORAL_TABLET | Freq: Every day | ORAL | 3 refills | Status: DC

## 2017-01-02 NOTE — Telephone Encounter (Signed)
Refill sent for amlodipine 10 mg to [facility].

## 2017-01-05 ENCOUNTER — Telehealth: Payer: Self-pay | Admitting: Family Medicine

## 2017-01-05 DIAGNOSIS — J432 Centrilobular emphysema: Secondary | ICD-10-CM

## 2017-01-05 MED ORDER — BUDESONIDE-FORMOTEROL FUMARATE 160-4.5 MCG/ACT IN AERO: 2.0000 | INHALATION_SPRAY | Freq: Two times a day (BID) | RESPIRATORY_TRACT | 3 refills | Status: DC

## 2017-01-05 NOTE — Telephone Encounter (Signed)
Sent refill Symbicort 90 day supply to [facility] order.  Nobie Putnam, DO [facility] 01/05/2017, 1:23 PM

## 2017-01-05 NOTE — Telephone Encounter (Signed)
Pt needs a 90 refill on symbicort sent to [place].  His call back number is [phone]

## 2017-01-19 ENCOUNTER — Other Ambulatory Visit: Payer: Self-pay

## 2017-01-19 DIAGNOSIS — J432 Centrilobular emphysema: Secondary | ICD-10-CM

## 2017-01-19 MED ORDER — BUDESONIDE-FORMOTEROL FUMARATE 160-4.5 MCG/ACT IN AERO: 2.0000 | INHALATION_SPRAY | Freq: Two times a day (BID) | RESPIRATORY_TRACT | 3 refills | Status: DC

## 2017-02-15 NOTE — Progress Notes (Signed)
Cardiology Office Note  Date:  02/16/2017   ID:  Stanley Mclean, Stanley Mclean 04/22/1940, MRN [id number]  PCP:  Olin Hauser, DO   Chief Complaint  Patient presents with  . other    6 month follow up. Meds reviewed by the pt. verbally. Pt. c/o difficulty breathing and has a runny nose& cough.     HPI:  [name] is a pleasant 76 year old gentleman with a  CVA DM II long smoking history who continues to smoke one pack per day,  coronary artery disease, stent to his mid LAD, 2 stents to  proximal LCX in May 2008, stent  proximal RCA July 2008, previous anginal symptoms  amputation of the right lower extremity from lawnmower accident 2 years ago who presents for routine followup of his coronary artery disease  Reports having worsening shortness of breath over the past several weeks to months Started with cough and congestion Wonders if he might have bronchitis Using his albuterol and Symbicort Noticed more wheezing, no significant sputum production apart from his mild baseline   Blood pressure stable No other TIA or stroke symptoms Denies any palpitations concerning for arrhythmia  Chest pain on previous office visit, declined cardiac catheterizationat that time Continues to smoke more than one pack per day  Lab work reviewed with him on today's visit HBA1C 7.9, total cholesterol 130, LDL 64  EKG personally reviewed by myself on todays visit Shows normal sinus rhythm with rate 68 bpm consider old anterior MI, nonsp st abn   CVA  03/2016  left arm deficits, Still only at 80% to 90% in terms of left hand strength  Other past medical history reviewed MRI results  Acute 1 cm nonhemorrhagic deep white matter infarct, RIGHT hemisphere. Atrophy and small vessel disease. Apparent high-grade stenosis of the vertical petrous segment RIGHT ICA, likely focal atherosclerosis. Intracranial atherosclerotic disease, most notably affecting the RIGHT greater than LEFT M2 and M3 MCA  branches.  CT neck  Plaque right carotid bifurcation with 64% maximal diameter stenosis at the junction of the right common carotid artery and proximal right internal carotid artery.  Plaque pre cavernous and cavernous segment right internal carotid artery with moderate narrowing.  Calcified plaque left carotid bifurcation. Calcified and noncalcified plaque proximal left internal carotid artery. Less than 60% diameter stenosis.  Calcified plaque left internal carotid artery cavernous segment with mild narrowing.  Cath by Dr. Ronalee Belts performed showing   50% RICA with normal distal right ICA   cardiac catheterization 06/06/2013, found to have severe distal RCA disease, mild in-stent restenosis in the circumflex and LAD, normal ejection fraction. 40% mid LAD disease, 40% B1 disease, 30% proximal circumflex also noted. He had Xience stent 2.5 x 15 mm placed in the distal RCA.   History of neuropathy.   PMH:   has a past medical history of Anemia, BPH (benign prostatic hyperplasia), CAD (coronary artery disease), COPD (chronic obstructive pulmonary disease) ([place]), GERD (gastroesophageal reflux disease), Macular degeneration, Pure hypercholesterolemia, Stroke ([place]), Tobacco abuse, Traumatic amputation of leg(s) (complete) (partial), unilateral, level not specified, without mention of complication (54/65/0354), Type II or unspecified type diabetes mellitus without mention of complication, not stated as uncontrolled, and Unspecified essential hypertension.  PSH:    Past Surgical History:  Procedure Laterality Date  . AMPUTATION Right 08/26/2011   Below Knee Amputee  . APPENDECTOMY    . CARDIAC CATHETERIZATION    . CAROTID ANGIOGRAPHY Right 05/14/2016   Procedure: Carotid Angiography;  Surgeon: Katha Cabal, MD;  Location: [facility];  Service: Cardiovascular;  Laterality: Right;  . CAROTID PTA/STENT INTERVENTION  05/14/2016   Procedure: Carotid PTA/Stent Intervention;   Surgeon: Katha Cabal, MD;  Location: [facility];  Service: Cardiovascular;;  . CORONARY ANGIOPLASTY WITH STENT PLACEMENT  09/28/2006   RCA  . CORONARY ANGIOPLASTY WITH STENT PLACEMENT  07/21/2006   Mid LAD  . CORONARY ANGIOPLASTY WITH STENT PLACEMENT  06/06/2013   30% prox LAD at the site of a prior stent, 40% mid LAD at the origin of D1, 40% ostial D1, 30% prox LCx at the site of a prior stent, in the proximal third of the vessel segment, 20% prox RCA, 99% distal RCA s/p DES. EF >55%.    Current Outpatient Medications  Medication Sig Dispense Refill  . albuterol (PROVENTIL HFA;VENTOLIN HFA) 108 (90 Base) MCG/ACT inhaler Inhale 2 puffs into the lungs every 6 (six) hours as needed for wheezing or shortness of breath. 6 Inhaler 1  . amLODipine (NORVASC) 10 MG tablet Take 1 tablet (10 mg total) by mouth daily. 90 tablet 3  . aspirin 81 MG tablet Take 81 mg by mouth daily.    Marland Kitchen atorvastatin (LIPITOR) 40 MG tablet Take 1 tablet (40 mg total) by mouth daily. 90 tablet 1  . budesonide-formoterol (SYMBICORT) 160-4.5 MCG/ACT inhaler Inhale 2 puffs 2 (two) times daily into the lungs. 3 Inhaler 3  . clopidogrel (PLAVIX) 75 MG tablet Take 1 tablet (75 mg total) by mouth daily. 90 tablet 3  . ipratropium (ATROVENT) 0.03 % nasal spray Place 2 sprays into both nostrils daily. 30 mL 0  . lisinopril (PRINIVIL,ZESTRIL) 30 MG tablet TAKE ONE (1) TABLET EACH DAY 90 tablet 1  . metoprolol tartrate (LOPRESSOR) 50 MG tablet Take 1 tablet (50 mg total) by mouth 2 (two) times daily. 180 tablet 2  . nitroGLYCERIN (NITROSTAT) 0.4 MG SL tablet Place 1 tablet (0.4 mg total) under the tongue every 5 (five) minutes as needed for chest pain. 25 tablet 0  . terazosin (HYTRIN) 1 MG capsule Take 1 capsule (1 mg total) by mouth at bedtime. 90 capsule 3   No current facility-administered medications for this visit.      Allergies:   Patient has no known allergies.   Social History:  The patient  reports that he  has been smoking cigarettes.  He has a 50.00 pack-year smoking history. he has never used smokeless tobacco. He reports that he does not drink alcohol or use drugs.   Family History:   family history includes Mental illness in his mother.    Review of Systems: Review of Systems  Constitutional: Negative.   Respiratory: Negative.   Cardiovascular: Negative.   Gastrointestinal: Negative.   Musculoskeletal: Negative.   Neurological: Negative.        Left hand weakness  Psychiatric/Behavioral: Negative.   All other systems reviewed and are negative.    PHYSICAL EXAM: VS:  BP (!) 142/70 (BP Location: Left Arm, Patient Position: Sitting, Cuff Size: Normal)   Pulse 68   Ht _0  (1.753 m)   Wt 139 lb 4 oz (63.2 kg)   BMI 20.56 kg/m  , BMI Body mass index is 20.56 kg/m. GEN: Well nourished, well developed, in no acute distress  HEENT: normal  Neck: no JVD,+  carotid bruit on the left 1/2,  no masses Cardiac: RRR; no murmurs, rubs, or gallops, trace edema  Above the sock line left lower extremity Respiratory: Decreased breath sounds moderately throughout, expiratory  wheezing,  normal work of breathing GI: soft, nontender, nondistended, + BS MS: no deformity or atrophy ,  Amputation right lower extremity below the knee Skin: warm and dry, no rash Neuro:  Strength and sensation are intact Psych: euthymic mood, full affect    Recent Labs: 03/30/2016: ALT 15 06/18/2016: BUN 12; Creatinine 1.1; Hemoglobin 14.6; Platelets 213; Potassium 4.1; Sodium 140; TSH 1.45    Lipid Panel Lab Results  Component Value Date   CHOL 130 06/18/2016   HDL 60 06/18/2016   LDLCALC 64 06/18/2016   TRIG 152 06/18/2016      Wt Readings from Last 3 Encounters:  02/16/17 139 lb 4 oz (63.2 kg)  11/19/16 141 lb 3.2 oz (64 kg)  11/04/16 140 lb (63.5 kg)       ASSESSMENT AND PLAN:  SOB (shortness of breath) - Plan: EKG 12-Lead Significant wheezing on exam  acute on chronic shortness of breath  likely COPD exacerbation Denies excessive sputum production, unable to exclude acute bronchitis Recommended for now we treat him with prednisone taper 60 mg 3 days followed by 40 mg 3 days, 20 mg 3 days then 10 mg for 3 days Suggested he consider pulmonary appointment given underlying COPD  Coronary artery disease involving native coronary artery of native heart with angina pectoris ([place]) - Plan: EKG 12-Lead Currently with no symptoms of angina. No further workup at this time. Continue current medication regimen.  We will need to watch carefully given his shortness of breath symptoms If no improvement, unable to exclude ischemia  Essential hypertension Blood pressure is well controlled on today's visit. No changes made to the medications. Stable  Smoker We have encouraged him to continue to work on weaning his cigarettes and smoking cessation. He will continue to work on this and does not want any assistance with chantix.  Again discussed with him  Controlled type 2 diabetes mellitus without complication, without long-term current use of insulin (HCC) Hemoglobin A1c 7.9,  diet-controlled  he will follow-up with primary care  For medication management Previously on metformin, stopped on his own  CVA  diffuse atherosclerosis  recommended aggressive diabetes control, smoking cessation  no strong indication of arrhythmia No recent TIA or stroke symptoms   Total encounter time more than 25 minutes  Greater than 50% was spent in counseling and coordination of care with the patient   Disposition:   F/U  6 months   Orders Placed This Encounter  Procedures  . EKG 12-Lead     Signed, Esmond Plants, M.D., Ph.D. 02/16/2017  [place], [place]

## 2017-02-16 ENCOUNTER — Ambulatory Visit: Payer: PPO | Admitting: Cardiovascular Disease

## 2017-02-16 ENCOUNTER — Encounter: Payer: Self-pay | Admitting: Cardiovascular Disease

## 2017-02-16 VITALS — BP 142/70 | HR 68 | Ht 69.0 in | Wt 139.2 lb

## 2017-02-16 DIAGNOSIS — I25118 Atherosclerotic heart disease of native coronary artery with other forms of angina pectoris: Secondary | ICD-10-CM | POA: Diagnosis not present

## 2017-02-16 DIAGNOSIS — J432 Centrilobular emphysema: Secondary | ICD-10-CM

## 2017-02-16 DIAGNOSIS — I63239 Cerebral infarction due to unspecified occlusion or stenosis of unspecified carotid arteries: Secondary | ICD-10-CM

## 2017-02-16 DIAGNOSIS — I639 Cerebral infarction, unspecified: Secondary | ICD-10-CM | POA: Diagnosis not present

## 2017-02-16 DIAGNOSIS — E1159 Type 2 diabetes mellitus with other circulatory complications: Secondary | ICD-10-CM | POA: Diagnosis not present

## 2017-02-16 DIAGNOSIS — Z955 Presence of coronary angioplasty implant and graft: Secondary | ICD-10-CM | POA: Diagnosis not present

## 2017-02-16 DIAGNOSIS — E782 Mixed hyperlipidemia: Secondary | ICD-10-CM | POA: Diagnosis not present

## 2017-02-16 DIAGNOSIS — F17200 Nicotine dependence, unspecified, uncomplicated: Secondary | ICD-10-CM | POA: Diagnosis not present

## 2017-02-16 DIAGNOSIS — R0602 Shortness of breath: Secondary | ICD-10-CM

## 2017-02-16 DIAGNOSIS — I209 Angina pectoris, unspecified: Secondary | ICD-10-CM

## 2017-02-16 DIAGNOSIS — I10 Essential (primary) hypertension: Secondary | ICD-10-CM

## 2017-02-16 MED ORDER — PREDNISONE 20 MG PO TABS
20.0000 mg | ORAL_TABLET | Freq: Every day | ORAL | 0 refills | Status: DC
Start: 2017-02-16 — End: 2017-07-29

## 2017-02-16 NOTE — Patient Instructions (Signed)
Medication Instructions:   For COPD exacerbation Prednisone 60 mg daily x 3 days Then 40 mg daily x 3 days Then 20 mg daily x 3 days Then 10 mg daily x 3 days  Labwork:  No new labs needed  Testing/Procedures:  No further testing at this time   Follow-Up: It was a pleasure seeing you in the office today. Please call us if you have new issues that need to be addressed before your next appt.  [phone]  Your physician wants you to follow-up in: 6 months.  You will receive a reminder letter in the mail two months in advance. If you don't receive a letter, please call our office to schedule the follow-up appointment.  If you need a refill on your cardiac medications before your next appointment, please call your pharmacy.

## 2017-05-07 ENCOUNTER — Other Ambulatory Visit: Payer: Self-pay

## 2017-05-07 ENCOUNTER — Telehealth: Payer: Self-pay | Admitting: Cardiovascular Disease

## 2017-05-07 MED ORDER — CLOPIDOGREL BISULFATE 75 MG PO TABS: 75.0000 mg | ORAL_TABLET | Freq: Every day | ORAL | 3 refills | Status: DC

## 2017-05-07 NOTE — Telephone Encounter (Signed)
*  STAT* If patient is at the pharmacy, call can be transferred to refill team.   1. Which medications need to be refilled? (please list name of each medication and dose if known) Plavix 75 mg po q d  2. Which pharmacy/location (including street and city if local pharmacy) is medication to be sent to? [facility]   3. Do they need a 30 day or 90 day supply? [place]

## 2017-06-29 DIAGNOSIS — M5136 Other intervertebral disc degeneration, lumbar region: Secondary | ICD-10-CM | POA: Diagnosis not present

## 2017-06-29 DIAGNOSIS — M9903 Segmental and somatic dysfunction of lumbar region: Secondary | ICD-10-CM | POA: Diagnosis not present

## 2017-07-01 ENCOUNTER — Other Ambulatory Visit: Payer: Self-pay

## 2017-07-01 ENCOUNTER — Telehealth: Payer: Self-pay

## 2017-07-01 DIAGNOSIS — J449 Chronic obstructive pulmonary disease, unspecified: Secondary | ICD-10-CM

## 2017-07-01 DIAGNOSIS — M9903 Segmental and somatic dysfunction of lumbar region: Secondary | ICD-10-CM | POA: Diagnosis not present

## 2017-07-01 DIAGNOSIS — M5136 Other intervertebral disc degeneration, lumbar region: Secondary | ICD-10-CM | POA: Diagnosis not present

## 2017-07-01 DIAGNOSIS — J432 Centrilobular emphysema: Secondary | ICD-10-CM

## 2017-07-01 MED ORDER — CLOPIDOGREL BISULFATE 75 MG PO TABS: 75.0000 mg | ORAL_TABLET | Freq: Every day | ORAL | 3 refills | Status: DC

## 2017-07-01 MED ORDER — BUDESONIDE-FORMOTEROL FUMARATE 160-4.5 MCG/ACT IN AERO: 2.0000 | INHALATION_SPRAY | Freq: Two times a day (BID) | RESPIRATORY_TRACT | 3 refills | Status: DC

## 2017-07-01 NOTE — Telephone Encounter (Signed)
Patient is requesting a refill of Symbicort to [facility]

## 2017-07-01 NOTE — Telephone Encounter (Signed)
Rx faxed to [facility] pharmacy fax # [phone] and phone # [phone].

## 2017-07-07 LAB — CBC AND DIFFERENTIAL
HCT: 47 (ref 41–53)
Hemoglobin: 15.7 (ref 13.5–17.5)
Platelets: 173 (ref 150–399)
WBC: 8.2

## 2017-07-07 LAB — LIPID PANEL
CHOLESTEROL: 123 (ref 0–200)
HDL: 61 (ref 35–70)
LDL Cholesterol: 61
Triglycerides: 131 (ref 40–160)

## 2017-07-07 LAB — BASIC METABOLIC PANEL
BUN: 14 (ref 4–21)
CREATININE: 1 (ref 0.6–1.3)
Glucose: 218
Potassium: 3.8 (ref 3.4–5.3)
Sodium: 140 (ref 137–147)

## 2017-07-07 LAB — HEMOGLOBIN A1C: Hemoglobin A1C: 8.2

## 2017-07-09 ENCOUNTER — Telehealth: Payer: Self-pay | Admitting: Cardiovascular Disease

## 2017-07-09 NOTE — Telephone Encounter (Signed)
*  STAT* If patient is at the pharmacy, call can be transferred to refill team.   1. Which medications need to be refilled? (please list name of each medication and dose if known) Terazosin   2. Which pharmacy/location (including street and city if local pharmacy) is medication to be sent to? [facility]   3. Do they need a 30 day or 90 day supply? 90 day

## 2017-07-10 ENCOUNTER — Other Ambulatory Visit: Payer: Self-pay

## 2017-07-10 MED ORDER — TERAZOSIN HCL 1 MG PO CAPS: 1.0000 mg | ORAL_CAPSULE | Freq: Every day | ORAL | 0 refills | Status: DC

## 2017-07-13 ENCOUNTER — Telehealth: Payer: Self-pay | Admitting: Family Medicine

## 2017-07-29 ENCOUNTER — Ambulatory Visit (INDEPENDENT_AMBULATORY_CARE_PROVIDER_SITE_OTHER): Payer: PPO | Admitting: Family Medicine

## 2017-07-29 ENCOUNTER — Encounter: Payer: Self-pay | Admitting: Family Medicine

## 2017-07-29 VITALS — BP 150/51 | HR 62 | Temp 98.3°F | Resp 16 | Ht 69.0 in | Wt 137.0 lb

## 2017-07-29 DIAGNOSIS — G546 Phantom limb syndrome with pain: Secondary | ICD-10-CM | POA: Insufficient documentation

## 2017-07-29 DIAGNOSIS — Z89511 Acquired absence of right leg below knee: Secondary | ICD-10-CM | POA: Diagnosis not present

## 2017-07-29 DIAGNOSIS — M150 Primary generalized (osteo)arthritis: Secondary | ICD-10-CM

## 2017-07-29 DIAGNOSIS — E1151 Type 2 diabetes mellitus with diabetic peripheral angiopathy without gangrene: Secondary | ICD-10-CM

## 2017-07-29 DIAGNOSIS — M545 Low back pain, unspecified: Secondary | ICD-10-CM

## 2017-07-29 DIAGNOSIS — M159 Polyosteoarthritis, unspecified: Secondary | ICD-10-CM

## 2017-07-29 DIAGNOSIS — IMO0002 Reserved for concepts with insufficient information to code with codable children: Secondary | ICD-10-CM

## 2017-07-29 DIAGNOSIS — E1165 Type 2 diabetes mellitus with hyperglycemia: Secondary | ICD-10-CM | POA: Diagnosis not present

## 2017-07-29 DIAGNOSIS — M8949 Other hypertrophic osteoarthropathy, multiple sites: Secondary | ICD-10-CM | POA: Insufficient documentation

## 2017-07-29 MED ORDER — BACLOFEN 10 MG PO TABS: 5.0000 mg | ORAL_TABLET | Freq: Three times a day (TID) | ORAL | 2 refills | Status: DC | PRN

## 2017-07-29 MED ORDER — PREGABALIN 50 MG PO CAPS: 50.0000 mg | ORAL_CAPSULE | Freq: Two times a day (BID) | ORAL | 2 refills | Status: DC

## 2017-07-29 MED ORDER — MELOXICAM 15 MG PO TABS: 15.0000 mg | ORAL_TABLET | Freq: Every day | ORAL | 1 refills | Status: DC

## 2017-07-29 NOTE — Assessment & Plan Note (Signed)
Suspected underlying etiology for acute on chronic low back pain - see A&P Has known OA/DJD in multiple joints including cervical and thoracic spine, prior imaging CT on file

## 2017-07-29 NOTE — Assessment & Plan Note (Signed)
Recurrence of phantom limb pain now S/p RLE - BKA 2013 Prior history on various meds gabapentin, lyrica, opioids - only effective lyrica - Restart Lyrica 50mg daily then up to 50mg BID for now - can adjust dose as need in future Follow-up  

## 2017-07-29 NOTE — Assessment & Plan Note (Signed)
Recurrence of phantom limb pain now S/p RLE - BKA 2013 Prior history on various meds gabapentin, lyrica, opioids - only effective lyrica - Restart Lyrica 65m daily then up to 57mBID for now - can adjust dose as need in future Follow-up

## 2017-07-29 NOTE — Patient Instructions (Addendum)
Thank you for coming to the office today.  1. For your Back Pain - I think that this is due to Muscle Spasms or strain - most likely degenerative arthritis in low back.  I do not think it is your hip  2. Start with anti-inflammatory Meloxicam 41m once daily with food every day for next 2 to 4 weeks if helping, then can use only as needed STOP taking Aleve. Do not take Ibuprofen, Advil, Naproxen  Recommend to start taking Tylenol Extra Strength 5079mtabs - take 1 to 2 tabs per dose (max 100020mevery 6-8 hours for pain (take regularly, don't skip a dose for next 7 days), max 24 hour daily dose is 6 tablets or 3000m42mn the future you can repeat the same everyday Tylenol course for 1-2 weeks at a time.   3. Start Baclofen (Lioresal) 10mg90mlets - cut in half for 5mg a88might for muscle relaxant - may make you sedated or sleepy (be careful driving or working on this) if tolerated you can take every 8 hours, half or whole tab  This pain may take weeks to months to fully resolve, but hopefully it will respond to the medicine initially. All back injuries (small or serious) are slow to heal since we use our back muscles every day. Be careful with turning, twisting, lifting, sitting / standing for prolonged periods, and avoid re-injury.  If your symptoms significantly worsen with more pain, or new symptoms with weakness in one or both legs, new or different shooting leg pains, numbness in legs or groin, loss of control or retention of urine or bowel movements, please call back for advice and you may need to go directly to the Emergency Department.  For PHantom Limb Pain Start Lyrica 50mg c88mle - one daily for 3-5 days then add 2nd pill for TWICE daily - in future we can adjust if need  Please schedule a Follow-up Appointment to: Return in about 1 month (around 08/26/2017) for Back Pain.  If you have any other questions or concerns, please feel free to call the office or send a message through  MyChartEmersonay also schedule an earlier appointment if necessary.  Additionally, you may be receiving a survey about your experience at our office within a few days to 1 week by e-mail or mail. We value your feedback.  Stanley Mclean [facility]  [facility]      Low Back Pain Exercises  See other page with pictures of each exercise.  Start with 1 or 2 of these exercises that you are most comfortable with. Do not do any exercises that cause you significant worsening pain. Some of these may cause some "stretching soreness" but it should go away after you stop the exercise, and get better over time. Gradually increase up to 3-4 exercises as tolerated.  Standing hamstring stretch: Place the heel of your leg on a stool about 15 inches high. Keep your knee straight. Lean forward, bending at the hips until you feel a mild stretch in the back of your thigh. Make sure you do not roll your shoulders and bend at the waist when doing this or you will stretch your lower back instead. Hold the stretch for 15 to 30 seconds. Repeat 3 times. Repeat the same stretch on your other leg.  Cat and camel: Get down on your hands and knees. Let your stomach sag, allowing your back to curve downward. Hold this position  for 5 seconds. Then arch your back and hold for 5 seconds. Do 3 sets of 10.  Quadriped Arm/Leg Raises: Get down on your hands and knees. Tighten your abdominal muscles to stiffen your spine. While keeping your abdominals tight, raise one arm and the opposite leg away from you. Hold this position for 5 seconds. Lower your arm and leg slowly and alternate sides. Do this 10 times on each side.  Pelvic tilt: Lie on your back with your knees bent and your feet flat on the floor. Tighten your abdominal muscles and push your lower back into the floor. Hold this position for 5 seconds, then relax. Do 3 sets of 10.  Partial curl: Lie on your back with your knees bent and  your feet flat on the floor. Tighten your stomach muscles and flatten your back against the floor. Tuck your chin to your chest. With your hands stretched out in front of you, curl your upper body forward until your shoulders clear the floor. Hold this position for 3 seconds. Don't hold your breath. It helps to breathe out as you lift your shoulders up. Relax. Repeat 10 times. Build to 3 sets of 10. To challenge yourself, clasp your hands behind your head and keep your elbows out to the side.  Lower trunk rotation: Lie on your back with your knees bent and your feet flat on the floor. Tighten your abdominal muscles and push your lower back into the floor. Keeping your shoulders down flat, gently rotate your legs to one side, then the other as far as you can. Repeat 10 to 20 times.  Single knee to chest stretch: Lie on your back with your legs straight out in front of you. Bring one knee up to your chest and grasp the back of your thigh. Pull your knee toward your chest, stretching your buttock muscle. Hold this position for 15 to 30 seconds and return to the starting position. Repeat 3 times on each side.  Double knee to chest: Lie on your back with your knees bent and your feet flat on the floor. Tighten your abdominal muscles and push your lower back into the floor. Pull both knees up to your chest. Hold for 5 seconds and repeat 10 to 20 times.

## 2017-07-29 NOTE — Progress Notes (Addendum)
Subjective:    Patient ID: Stanley Mclean, male    DOB: 27-Jul-1940, 77 y.o.   MRN: [id number]  Stanley Mclean is a 77 y.o. male presenting on 07/29/2017 for Back Pain   HPI   LOW BACK PAIN, Left Low Back / Hip Chronic problem over many years, he has episodic low back pain, usual flare up will last about a week or less, self limited and he has never seen a doctor for it before.  - Reports symptoms started about 1 month ago without known inciting injury. - Today seems to be persistent. Describes pain as LEFT lower back and hip region, sharp stabbing and throbbing type pain, that is constant 24 hours 4 to 5 out of 10 on average but has episodic worsening up to severe up to 9 out of 10 at worst with intermittent worsening with prolonged standing, somewhat improve with sitting and laying. No pain radiating to legs. - Now for recent episode he contacted his insurance he was able to reach a "telemedicine doctor" and they sent him a Medrol Dose Pak oral steroid over 6 days within this past week, last dose was Saturday 4 days ago, nearly resolved the issue and pain but then when finished med about 10 hours later he had the pain return about same intensity - Tried Aleve OTC 1 daily with mild relief - Not tried Tylenol - no particular reason - Tried heating pad at night without relief. Tried lidocaine patch with some relief - History of other spine Cervical and Thoracic spine scoliosis and OA/DJD. Never had back surgery or injection - Prior similar back pain flares improved with rest - Not keeping him awake at night - Denies any fevers/chills, numbness, tingling, weakness, loss of control bladder/bowel incontinence or retention, unintentional wt loss, night sweats  Phantom Limb Pain Chronic Right Lower Ext Amputation (BKA) - 2013 Previously had some phantom limb pain eventually this improved. He was on various meds before, Used to be on Gabapentin, Oxycodone, Oxycontin, Dilaudid, Lyrica. Only help  was lyrica, would like to resume, previous PCP rx several years ago, has been off, now ask to restart with worsening pain mostly night time keeping him awake at times. Denies redness swelling drainage ulceration injury trauma  CHRONIC DM, Type 2: Report from [place] with elevated A1c sugar Meds: Metformin 500 BID Currently on ACEi Denies hypoglycemia  Past Surgical History:  Procedure Laterality Date  . AMPUTATION Right 08/26/2011   Below Knee Amputee  . APPENDECTOMY    . CARDIAC CATHETERIZATION    . CAROTID ANGIOGRAPHY Right 05/14/2016   Procedure: Carotid Angiography;  Surgeon: Katha Cabal, MD;  Location: [facility];  Service: Cardiovascular;  Laterality: Right;  . CAROTID PTA/STENT INTERVENTION  05/14/2016   Procedure: Carotid PTA/Stent Intervention;  Surgeon: Katha Cabal, MD;  Location: [facility];  Service: Cardiovascular;;  . CORONARY ANGIOPLASTY WITH STENT PLACEMENT  09/28/2006   RCA  . CORONARY ANGIOPLASTY WITH STENT PLACEMENT  07/21/2006   Mid LAD  . CORONARY ANGIOPLASTY WITH STENT PLACEMENT  06/06/2013   30% prox LAD at the site of a prior stent, 40% mid LAD at the origin of D1, 40% ostial D1, 30% prox LCx at the site of a prior stent, in the proximal third of the vessel segment, 20% prox RCA, 99% distal RCA s/p DES. EF >55%.     Depression screen [facility] 2/9 07/29/2017 07/15/2016 07/15/2016  Decreased Interest 0 0 0  Down, Depressed, Hopeless 0  0 0  PHQ - 2 Score 0 0 0  Altered sleeping - 0 0  Tired, decreased energy - 1 1  Change in appetite - 0 0  Feeling bad or failure about yourself  - 0 0  Trouble concentrating - 0 0  Moving slowly or fidgety/restless - 0 0  Suicidal thoughts - 0 0  PHQ-9 Score - 1 1  Difficult doing work/chores - Not difficult at all Not difficult at all    Social History   Tobacco Use  . Smoking status: Current Every Day Smoker    Packs/day: 1.00    Years: 50.00    Pack years: 50.00    Types: Cigarettes    Last attempt  to quit: 03/29/2016    Years since quitting: 1.3  . Smokeless tobacco: Never Used  Substance Use Topics  . Alcohol use: No  . Drug use: No    Review of Systems Per HPI unless specifically indicated above     Objective:    BP (!) 150/51   Pulse 62   Temp 98.3 F (36.8 C) (Oral)   Resp 16   Ht _0  (1.753 m)   Wt 137 lb (62.1 kg)   BMI 20.23 kg/m   Wt Readings from Last 3 Encounters:  07/29/17 137 lb (62.1 kg)  02/16/17 139 lb 4 oz (63.2 kg)  11/19/16 141 lb 3.2 oz (64 kg)    Physical Exam  Constitutional: He is oriented to person, place, and time. He appears well-developed and well-nourished. No distress.  Elderly appearing 77 year old male, mild discomfort with low back pain, cooperative  HENT:  Head: Normocephalic and atraumatic.  Mouth/Throat: Oropharynx is clear and moist.  Eyes: Conjunctivae are normal. Right eye exhibits no discharge. Left eye exhibits no discharge.  Cardiovascular: Normal rate, regular rhythm, normal heart sounds and intact distal pulses.  No murmur heard. Pulmonary/Chest: Effort normal and breath sounds normal. No respiratory distress. He has no wheezes. He has no rales.  Musculoskeletal: He exhibits no edema.  Right Lower Extremity s/p below knee amputation, with prosthetic limb in place.  Low Back / Left Hip Inspection: BACK - increased thoracic kyphosis and some slight scoliosis curvature of spine  HIP - Normal appearance, symmetrical, no obvious leg length or pelvis deformity  Palpation: BACK - No tenderness over spinous processes. R > L paraspinal muscle prominence due to scoliosis. - Left lower lumbar paraspinal muscles non tender but hypertonic with some spasm  HIP - Non tender L greater trochanter region of lateral upper thigh. Lower extremity thigh calf soft non tender no spasm.  ROM: BACK - Slightly limited forward flex and extension ROM  HIP - Bilateral hip flex/ext supine normal without pain, some reduced external and internal  rotation of hip  Special Testing: BACK - Seated SLR negative for radicular pain bilaterally HIP - FABER FADIR normal and non tender but with some limited movement. Acetabular compression of hip normal without pain  Strength: Bilateral hip flex/ext 5/5, knee flex/ext 5/5, ankle dorsiflex/plantarflex 5/5 Neurovascular: intact distal sensation to light touch  Neurological: He is alert and oriented to person, place, and time.  Skin: Skin is warm and dry. No rash noted. He is not diaphoretic. No erythema.  Psychiatric: He has a normal mood and affect. His behavior is normal.  Well groomed, good eye contact, normal speech and thoughts  Nursing note and vitals reviewed.  Results for orders placed or performed in visit on 07/29/17  CBC and differential  Result Value Ref Range   Hemoglobin 15.7 13.5 - 17.5   HCT 47 41 - 53   Platelets 173 150 - 399   WBC 8.2   Basic metabolic panel  Result Value Ref Range   Glucose 218    BUN 14 4 - 21   Creatinine 1.0 0.6 - 1.3   Potassium 3.8 3.4 - 5.3   Sodium 140 137 - 147  Lipid panel  Result Value Ref Range   Triglycerides 131 40 - 160   Cholesterol 123 0 - 200   HDL 61 35 - 70   LDL Cholesterol 61   Hemoglobin A1c  Result Value Ref Range   Hemoglobin A1C 8.2       Assessment & Plan:   Problem List Items Addressed This Visit    Below knee amputation status, right (HCC)    Recurrence of phantom limb pain now S/p RLE - BKA 2013 Prior history on various meds gabapentin, lyrica, opioids - only effective lyrica - Restart Lyrica 7m daily then up to 565mBID for now - can adjust dose as need in future Follow-up       Relevant Medications   pregabalin (LYRICA) 50 MG capsule   DM (diabetes mellitus), type 2, uncontrolled, periph vascular complic (HCC)    Not focus of visit today [place] [facility] [place]  Uncontrolled DM with A1c 8.2 (per [place] [facility] [place] [place] 7.[facility]) Complications - PAD, carotid  stenosis, CAD  Plan:  1. Continue current therapy - Metformin 50046mID 2. Encourage improved lifestyle - low carb, low sugar diet, reduce portion size, continue improving regular exercise 3. Continue ASA, ACEi, Statin 5. Due DM Foot exam       Relevant Medications   metFORMIN (GLUCOPHAGE) 500 MG tablet   Phantom limb syndrome with pain (HCC)    Recurrence of phantom limb pain now S/p RLE - BKA 2013 Prior history on various meds gabapentin, lyrica, opioids - only effective lyrica - Restart Lyrica 43m68mily then up to 43mg72m for now - can adjust dose as need in future Follow-up      Relevant Medications   pregabalin (LYRICA) 50 MG capsule   baclofen (LIORESAL) 10 MG tablet   Primary osteoarthritis involving multiple joints    Suspected underlying etiology for acute on chronic low back pain - see A&P Has known OA/DJD in multiple joints including cervical and thoracic spine, prior imaging CT on file      Relevant Medications   meloxicam (MOBIC) 15 MG tablet   baclofen (LIORESAL) 10 MG tablet    Other Visit Diagnoses    Acute left-sided low back pain without sciatica    -  Primary   Relevant Medications   meloxicam (MOBIC) 15 MG tablet   baclofen (LIORESAL) 10 MG tablet      Acute on chronic L LBP without associated sciatica. Suspect likely due to muscle spasm/strain, without known injury or trauma. In setting of known episodic chronic LBP, known OA/DJD in other areas of spine C/T No prior back surgery - No red flag symptoms. Negative SLR for radiculopathy - Inadequate conservative therapy   Plan: 1. Discussed diagnosis of arthritis and degenerative disc disease - also with scoliosis of spine - Finished medrol dose pak with improvement - Now start anti-inflammatory trial with rx Meloxicam 15mg 81mc x 2-4 weeks, then PRN 2. Start muscle relaxant with Baclofen 10mg t75m- take 5-10mg up63mTID  PRN, titrate up as tolerated 3. May use Tylenol PRN for breakthrough - then  regularly after flare 4. Defer x-rays today since acute onset 5. Follow-up 1 month, re-evaluation. If not improved consider X-ray imaging given >4 weeks. Consider trial of PT for strengthening. - may need repeat prednisone burst in future if develop sciatica or other complication or worse pain   Meds ordered this encounter  Medications  . meloxicam (MOBIC) 15 MG tablet    Sig: Take 1 tablet (15 mg total) by mouth daily. For 2 to 4 weeks, then as needed only    Dispense:  30 tablet    Refill:  1  . pregabalin (LYRICA) 50 MG capsule    Sig: Take 1 capsule (50 mg total) by mouth 2 (two) times daily. Start with 1 cap daily for first 3-5 days, then increase to twice daily    Dispense:  60 capsule    Refill:  2  . baclofen (LIORESAL) 10 MG tablet    Sig: Take 0.5-1 tablets (5-10 mg total) by mouth 3 (three) times daily as needed for muscle spasms.    Dispense:  30 each    Refill:  2    Follow up plan: Return in about 1 month (around 08/26/2017) for Back Pain.   Nobie Putnam, [facility] 07/29/2017, 5:22 PM

## 2017-07-29 NOTE — Assessment & Plan Note (Addendum)
Not focus of visit today - report from [place] recently [facility]  Uncontrolled DM with A1c 8.2 (per report from [place], [place] from 7.9) Complications - PAD, carotid stenosis, CAD  Plan:  1. Continue current therapy - Metformin 581m BID 2. Encourage improved lifestyle - low carb, low sugar diet, reduce portion size, continue improving regular exercise 3. Continue ASA, ACEi, Statin 5. Due DM Foot exam

## 2017-09-07 ENCOUNTER — Other Ambulatory Visit: Payer: Self-pay | Admitting: Cardiovascular Disease

## 2017-10-11 NOTE — Progress Notes (Signed)
Cardiology Office Note  Date:  10/13/2017   ID:  Jasraj, Lappe 1940/09/29, MRN [id number]  PCP:  Olin Hauser, DO   Chief Complaint  Patient presents with  . OTHER    6 month f/u c/o back pain. Meds reviewed verbally with pt.    HPI:  Mr. Scicchitano is a pleasant 77 year old gentleman with a  CVA DM II long smoking history who continues to smoke one pack per day,  coronary artery disease, stent to his mid LAD, 2 stents to  proximal LCX in May 2008, stent  proximal RCA July 2008, previous anginal symptoms  amputation of the right lower extremity from lawnmower accident 2 years ago who presents for routine followup of his coronary artery disease  In follow-up he reports no significant change in his symptoms Continues to smoke one pack per day Chronic shortness of breath on exertion No recent episodes of bronchitis requiring antibiotics  No regular exercise Limited by chronic back pain and shortness of breath  Blood pressure stable No  TIA or stroke symptoms Denies any palpitations concerning for arrhythmia  Previously declined cardiac catheterization for symptoms of chest pain  Lab work reviewed with him on today's visit HBA1C 7.9, total cholesterol 130, LDL 64  EKG personally reviewed by myself on todays visit Shows normal sinus rhythm with rate 67 bpm consider old anterior MI, nonsp st abn   Other past medical history reviewed CVA  03/2016   left arm deficits, Still only at 80% to 90% in terms of left hand strength  Other past medical history reviewed MRI results  Acute 1 cm nonhemorrhagic deep white matter infarct, RIGHT hemisphere. Atrophy and small vessel disease. Apparent high-grade stenosis of the vertical petrous segment RIGHT ICA, likely focal atherosclerosis. Intracranial atherosclerotic disease, most notably affecting the RIGHT greater than LEFT M2 and M3 MCA branches.  CT neck  Plaque right carotid bifurcation with 64% maximal diameter  stenosis at the junction of the right common carotid artery and proximal right internal carotid artery.  Plaque pre cavernous and cavernous segment right internal carotid artery with moderate narrowing.  Calcified plaque left carotid bifurcation. Calcified and noncalcified plaque proximal left internal carotid artery. Less than 60% diameter stenosis.  Calcified plaque left internal carotid artery cavernous segment with mild narrowing.  Cath by Dr. Ronalee Belts performed showing   50% RICA with normal distal right ICA   cardiac catheterization 06/06/2013, found to have severe distal RCA disease, mild in-stent restenosis in the circumflex and LAD, normal ejection fraction. 40% mid LAD disease, 40% B1 disease, 30% proximal circumflex also noted. He had Xience stent 2.5 x 15 mm placed in the distal RCA.   History of neuropathy.   PMH:   has a past medical history of Anemia, BPH (benign prostatic hyperplasia), CAD (coronary artery disease), COPD (chronic obstructive pulmonary disease) ([place]), GERD (gastroesophageal reflux disease), Macular degeneration, Pure hypercholesterolemia, Stroke ([place]), Tobacco abuse, Traumatic amputation of leg(s) (complete) (partial), unilateral, level not specified, without mention of complication (40/98/1191), and Unspecified essential hypertension.  PSH:    Past Surgical History:  Procedure Laterality Date  . AMPUTATION Right 08/26/2011   Below Knee Amputee  . APPENDECTOMY    . CARDIAC CATHETERIZATION    . CAROTID ANGIOGRAPHY Right 05/14/2016   Procedure: Carotid Angiography;  Surgeon: Katha Cabal, MD;  Location: [facility];  Service: Cardiovascular;  Laterality: Right;  . CAROTID PTA/STENT INTERVENTION  05/14/2016   Procedure: Carotid PTA/Stent Intervention;  Surgeon: Katha Cabal,  MD;  Location: [facility];  Service: Cardiovascular;;  . CORONARY ANGIOPLASTY WITH STENT PLACEMENT  09/28/2006   RCA  . CORONARY ANGIOPLASTY WITH STENT  PLACEMENT  07/21/2006   Mid LAD  . CORONARY ANGIOPLASTY WITH STENT PLACEMENT  06/06/2013   30% prox LAD at the site of a prior stent, 40% mid LAD at the origin of D1, 40% ostial D1, 30% prox LCx at the site of a prior stent, in the proximal third of the vessel segment, 20% prox RCA, 99% distal RCA s/p DES. EF >55%.    Current Outpatient Medications  Medication Sig Dispense Refill  . albuterol (PROVENTIL HFA;VENTOLIN HFA) 108 (90 Base) MCG/ACT inhaler Inhale 2 puffs into the lungs every 6 (six) hours as needed for wheezing or shortness of breath. (Patient taking differently: Inhale 2 puffs into the lungs every 6 (six) hours as needed for wheezing or shortness of breath. ) 6 Inhaler 1  . amLODipine (NORVASC) 10 MG tablet Take 1 tablet (10 mg total) by mouth daily. 90 tablet 3  . aspirin 81 MG tablet Take 81 mg by mouth daily.    Marland Kitchen atorvastatin (LIPITOR) 40 MG tablet Take 1 tablet (40 mg total) by mouth daily. 90 tablet 1  . baclofen (LIORESAL) 10 MG tablet Take 0.5-1 tablets (5-10 mg total) by mouth 3 (three) times daily as needed for muscle spasms. 30 each 2  . budesonide-formoterol (SYMBICORT) 160-4.5 MCG/ACT inhaler Inhale 2 puffs into the lungs 2 (two) times daily. 3 Inhaler 3  . clopidogrel (PLAVIX) 75 MG tablet Take 1 tablet (75 mg total) by mouth daily. 90 tablet 3  . lisinopril (PRINIVIL,ZESTRIL) 30 MG tablet TAKE ONE (1) TABLET EACH DAY 90 tablet 1  . meloxicam (MOBIC) 15 MG tablet Take 1 tablet (15 mg total) by mouth daily. For 2 to 4 weeks, then as needed only 30 tablet 1  . metFORMIN (GLUCOPHAGE) 500 MG tablet Take by mouth 2 (two) times daily with a meal.    . metoprolol tartrate (LOPRESSOR) 50 MG tablet Take 1 tablet (50 mg total) by mouth 2 (two) times daily. 60 tablet 0  . nitroGLYCERIN (NITROSTAT) 0.4 MG SL tablet Place 1 tablet (0.4 mg total) under the tongue every 5 (five) minutes as needed for chest pain. 25 tablet 0  . terazosin (HYTRIN) 1 MG capsule Take 1 capsule (1 mg total) by  mouth at bedtime. 90 capsule 0   No current facility-administered medications for this visit.      Allergies:   Patient has no known allergies.   Social History:  The patient  reports that he has been smoking cigarettes.  He has a 50.00 pack-year smoking history. He has never used smokeless tobacco. He reports that he does not drink alcohol or use drugs.   Family History:   family history includes Mental illness in his mother.    Review of Systems: Review of Systems  Constitutional: Negative.   Respiratory: Negative.   Cardiovascular: Negative.   Gastrointestinal: Negative.   Musculoskeletal: Negative.   Neurological: Negative.        Left hand weakness  Psychiatric/Behavioral: Negative.   All other systems reviewed and are negative.    PHYSICAL EXAM: VS:  BP 134/60 (BP Location: Left Arm, Patient Position: Sitting, Cuff Size: Normal)   Pulse 67   Ht _0  (1.753 m)   Wt 135 lb 8 oz (61.5 kg)   BMI 20.01 kg/m  , BMI Body mass index is 20.01 kg/m. Constitutional:  oriented to person, place, and time. No distress.  HENT:  Head: Normocephalic and atraumatic.  Eyes:  no discharge. No scleral icterus.  Neck: Normal range of motion. Neck supple. No JVD present. 1+ bruit appreciated on the left Cardiovascular: Normal rate, regular rhythm, normal heart sounds and intact distal pulses. Exam reveals no gallop and no friction rub. No edema No murmur heard. Pulmonary/Chest:moderately decreased breath sounds throughout, No stridor. No respiratory distress.  no wheezes.  no rales.  no tenderness.  Abdominal: Soft.  no distension.  no tenderness.  Musculoskeletal: Normal range of motion.  no  tenderness or deformity.  Neurological:  normal muscle tone. Coordination normal. No atrophy Skin: Skin is warm and dry. No rash noted. not diaphoretic.  Psychiatric:  normal mood and affect. behavior is normal. Thought content normal.     Recent Labs: 07/07/2017: BUN 14; Creatinine 1.0;  Hemoglobin 15.7; Platelets 173; Potassium 3.8; Sodium 140    Lipid Panel Lab Results  Component Value Date   CHOL 123 07/07/2017   HDL 61 07/07/2017   LDLCALC 61 07/07/2017   TRIG 131 07/07/2017      Wt Readings from Last 3 Encounters:  10/13/17 135 lb 8 oz (61.5 kg)  07/29/17 137 lb (62.1 kg)  02/16/17 139 lb 4 oz (63.2 kg)       ASSESSMENT AND PLAN:  SOB (shortness of breath) - Plan: EKG 12-Lead Severe underlying COPD Continues to smoke one pack per day No recent COPD exacerbation  Coronary artery disease involving native coronary artery of native heart with angina pectoris ([place]) - Plan: EKG 12-Lead Currently with no symptoms of angina. No further workup at this time.  Previously declined cardiac catheterization for chest pain symptoms High risk of ischemia given continued smoking He does not want workup  Essential hypertension Blood pressure is well controlled on today's visit. No changes made to the medications. Stable  Smoker We have encouraged him to continue to work on weaning his cigarettes and smoking cessation. He will continue to work on this and does not want any assistance with chantix.  He does not want any help. No desire to quit  Controlled type 2 diabetes mellitus without complication, without long-term current use of insulin (HCC) Previously on metformin, stopped on his own It appears he is back on metformin and followed by primary care  CVA  diffuse atherosclerosis  recommended aggressive diabetes control, smoking cessation  no strong indication of arrhythmia No recent TIA or stroke symptoms   Total encounter time more than 25 minutes  Greater than 50% was spent in counseling and coordination of care with the patient   Disposition:   F/U  12 months   Orders Placed This Encounter  Procedures  . EKG 12-Lead     Signed, Esmond Plants, M.D., Ph.D. 10/13/2017  [place], [place]

## 2017-10-13 ENCOUNTER — Ambulatory Visit (INDEPENDENT_AMBULATORY_CARE_PROVIDER_SITE_OTHER): Payer: PPO | Admitting: Cardiovascular Disease

## 2017-10-13 ENCOUNTER — Encounter: Payer: Self-pay | Admitting: Cardiovascular Disease

## 2017-10-13 VITALS — BP 134/60 | HR 67 | Ht 69.0 in | Wt 135.5 lb

## 2017-10-13 DIAGNOSIS — I6523 Occlusion and stenosis of bilateral carotid arteries: Secondary | ICD-10-CM

## 2017-10-13 DIAGNOSIS — J432 Centrilobular emphysema: Secondary | ICD-10-CM | POA: Diagnosis not present

## 2017-10-13 DIAGNOSIS — E782 Mixed hyperlipidemia: Secondary | ICD-10-CM | POA: Diagnosis not present

## 2017-10-13 DIAGNOSIS — I25118 Atherosclerotic heart disease of native coronary artery with other forms of angina pectoris: Secondary | ICD-10-CM | POA: Diagnosis not present

## 2017-10-13 DIAGNOSIS — R0602 Shortness of breath: Secondary | ICD-10-CM

## 2017-10-13 DIAGNOSIS — I10 Essential (primary) hypertension: Secondary | ICD-10-CM

## 2017-10-13 DIAGNOSIS — F17200 Nicotine dependence, unspecified, uncomplicated: Secondary | ICD-10-CM

## 2017-10-13 DIAGNOSIS — E1151 Type 2 diabetes mellitus with diabetic peripheral angiopathy without gangrene: Secondary | ICD-10-CM

## 2017-10-13 DIAGNOSIS — IMO0002 Reserved for concepts with insufficient information to code with codable children: Secondary | ICD-10-CM

## 2017-10-13 DIAGNOSIS — E1165 Type 2 diabetes mellitus with hyperglycemia: Secondary | ICD-10-CM | POA: Diagnosis not present

## 2017-10-13 MED ORDER — TERAZOSIN HCL 1 MG PO CAPS: 1.0000 mg | ORAL_CAPSULE | Freq: Every day | ORAL | 3 refills | Status: DC

## 2017-10-13 MED ORDER — METOPROLOL TARTRATE 50 MG PO TABS: 50.0000 mg | ORAL_TABLET | Freq: Two times a day (BID) | ORAL | 3 refills | Status: DC

## 2017-10-13 MED ORDER — NITROGLYCERIN 0.4 MG SL SUBL: 0.4000 mg | SUBLINGUAL_TABLET | SUBLINGUAL | 0 refills | Status: AC | PRN

## 2017-10-13 NOTE — Patient Instructions (Addendum)
Talk with Dr. Raliegh Ip about a free lung CT scan through medicare   Medication Instructions:   Stop asa Stay on plavix one a day  Labwork:  No new labs needed  Testing/Procedures:  No further testing at this time   Follow-Up: It was a pleasure seeing you in the office today. Please call us if you have new issues that need to be addressed before your next appt.  [phone]  Your physician wants you to follow-up in: 12 months.  You will receive a reminder letter in the mail two months in advance. If you don't receive a letter, please call our office to schedule the follow-up appointment.  If you need a refill on your cardiac medications before your next appointment, please call your pharmacy.  For educational health videos Log in to : [email or web address] Or : [email or web address], password : [email or web address]

## 2017-10-14 ENCOUNTER — Other Ambulatory Visit: Payer: Self-pay | Admitting: Family Medicine

## 2017-10-14 DIAGNOSIS — E1151 Type 2 diabetes mellitus with diabetic peripheral angiopathy without gangrene: Secondary | ICD-10-CM

## 2017-10-14 DIAGNOSIS — IMO0002 Reserved for concepts with insufficient information to code with codable children: Secondary | ICD-10-CM

## 2017-10-14 DIAGNOSIS — E1165 Type 2 diabetes mellitus with hyperglycemia: Principal | ICD-10-CM

## 2017-10-14 NOTE — Telephone Encounter (Signed)
Pt needs a refill on metformin sent to [facility].  His call back number is [phone]

## 2017-10-15 ENCOUNTER — Other Ambulatory Visit: Payer: Self-pay

## 2017-10-15 MED ORDER — METFORMIN HCL 500 MG PO TABS: 500.0000 mg | ORAL_TABLET | Freq: Two times a day (BID) | ORAL | 1 refills | Status: DC

## 2017-10-15 MED ORDER — METOPROLOL TARTRATE 50 MG PO TABS: 50.0000 mg | ORAL_TABLET | Freq: Two times a day (BID) | ORAL | 3 refills | Status: DC

## 2017-10-15 NOTE — Telephone Encounter (Signed)
*  STAT* If patient is at the pharmacy, call can be transferred to refill team.   1. Which medications need to be refilled? (please list name of each medication and dose if known) Metoprolol  2. Which pharmacy/location (including street and city if local pharmacy) is medication to be sent to? [facility]  3. Do they need a 30 day or 90 day supply? [place]

## 2017-10-16 ENCOUNTER — Encounter: Payer: Self-pay | Admitting: Family Medicine

## 2017-10-16 ENCOUNTER — Ambulatory Visit (INDEPENDENT_AMBULATORY_CARE_PROVIDER_SITE_OTHER): Payer: PPO | Admitting: Family Medicine

## 2017-10-16 VITALS — BP 140/60 | HR 73 | Temp 98.9°F | Resp 16 | Ht 69.0 in | Wt 135.0 lb

## 2017-10-16 DIAGNOSIS — M159 Polyosteoarthritis, unspecified: Secondary | ICD-10-CM

## 2017-10-16 DIAGNOSIS — M8949 Other hypertrophic osteoarthropathy, multiple sites: Secondary | ICD-10-CM

## 2017-10-16 DIAGNOSIS — G8929 Other chronic pain: Secondary | ICD-10-CM | POA: Diagnosis not present

## 2017-10-16 DIAGNOSIS — M150 Primary generalized (osteo)arthritis: Secondary | ICD-10-CM

## 2017-10-16 DIAGNOSIS — M5442 Lumbago with sciatica, left side: Secondary | ICD-10-CM | POA: Diagnosis not present

## 2017-10-16 MED ORDER — TIZANIDINE HCL 2 MG PO TABS: 2.0000 mg | ORAL_TABLET | Freq: Two times a day (BID) | ORAL | 1 refills | Status: DC | PRN

## 2017-10-16 MED ORDER — PREDNISONE 20 MG PO TABS: ORAL_TABLET | ORAL | 0 refills | Status: DC

## 2017-10-16 MED ORDER — TIZANIDINE HCL 2 MG PO CAPS: 2.0000 mg | ORAL_CAPSULE | Freq: Two times a day (BID) | ORAL | 1 refills | Status: DC | PRN

## 2017-10-16 NOTE — Progress Notes (Signed)
Subjective:    Patient ID: Stanley Mclean, male    DOB: [birth year], 77 y.o.   MRN: [id number]  Stanley Mclean is a 77 y.o. male presenting on 10/16/2017 for Back Pain (follow up getting worst)   HPI   LOW BACK PAIN, Bilateral - Last visit with me 07/29/17, for initial visit for same problem chronic back pain, prior to that visit he was given medrol dosepak with improvement, then worsening again - treated with Baclofen, Meloxicam and also given rx lyrica restarted after previous use in past for phantom limb painhe has prior history of chronic back pain, see prior notes for background information. - Today patient reports overall low back pain is worse than last visit 3 months ago. Initially it was LEFT sided mostly, then since then it has been move RIGHT and also now it seems more centralized low back pain. Previously it was not affecting his legs before as much, and now it seems worse in past few weeks to month with radiating pain down his Left leg, feels more "sharp stabbing" pain that is more severe. - Similar to last time if he is sitting and resting and laying down  - He has tried Baclofen without significant results. Stopped taking. Has some left - He has finished Meloxicam 91m daily for several 2 week courses with moderate relief up to 50% improved while taking but then stopped and pain has returned. - Additionally his R sided leg phantom pains have been more mild, he has tried the Lyrica given to him last visit but it was strong and made him sleepy, these symptoms have been more mild and less problem now, he is not taking Lyrica. - History of other spine Cervical and Thoracic spine scoliosis and OA/DJD. Never had back surgery or injection - Denies any fevers/chills, numbness, tingling, weakness, loss of control bladder/bowel incontinence or retention, unintentional wt loss, night sweats  [facility] 10/16/2017 07/29/2017 07/15/2016  Decreased Interest 0 0 0  Down, Depressed,  Hopeless 0 0 0  PHQ - 2 Score 0 0 0  Altered sleeping - - 0  Tired, decreased energy - - 1  Change in appetite - - 0  Feeling bad or failure about yourself  - - 0  Trouble concentrating - - 0  Moving slowly or fidgety/restless - - 0  Suicidal thoughts - - 0  PHQ-9 Score - - 1  Difficult doing work/chores - - Not difficult at all    Social History   Tobacco Use  . Smoking status: Current Every Day Smoker    Packs/day: 1.00    Years: 50.00    Pack years: 50.00    Types: Cigarettes    Last attempt to quit: 03/29/2016    Years since quitting: 1.5  . Smokeless tobacco: Never Used  Substance Use Topics  . Alcohol use: No  . Drug use: No    Review of Systems Per HPI unless specifically indicated above     Objective:    BP 140/60   Pulse 73   Temp 98.9 F (37.2 C) (Oral)   Resp 16   Ht _0  (1.753 m)   Wt 135 lb (61.2 kg)   BMI 19.94 kg/m   Wt Readings from Last 3 Encounters:  10/16/17 135 lb (61.2 kg)  10/13/17 135 lb 8 oz (61.5 kg)  07/29/17 137 lb (62.1 kg)    Physical Exam  Constitutional: He is oriented to person, place, and time. He appears well-developed  and well-nourished. No distress.  Elderly appearing 77 year old male, comfortable seated at rest but discomfort with low back pain upon position change, cooperative  HENT:  Head: Normocephalic and atraumatic.  Mouth/Throat: Oropharynx is clear and moist.  Eyes: Conjunctivae are normal. Right eye exhibits no discharge. Left eye exhibits no discharge.  Cardiovascular: Normal rate, regular rhythm, normal heart sounds and intact distal pulses.  No murmur heard. Pulmonary/Chest: Effort normal and breath sounds normal. No respiratory distress. He has no wheezes. He has no rales.  Musculoskeletal: He exhibits no edema.  Right Lower Extremity s/p below knee amputation, with prosthetic limb in place.  Low Back / Left Hip Inspection: BACK - increased thoracic kyphosis and some slight scoliosis curvature of  spine  HIP - Normal appearance, symmetrical, no obvious leg length or pelvis deformity  Palpation: BACK - No tenderness over spinous processes. R > L paraspinal muscle prominence due to scoliosis. - Bilateral lower lumbar paraspinal muscles non tender but hypertonic with some spasm  HIP - Non tender L greater trochanter region of lateral upper thigh. Lower extremity thigh calf soft non tender no spasm.  ROM: BACK - Slightly limited forward flex and extension ROM  HIP - Bilateral hip flex/ext supine normal without pain, some reduced external and internal rotation of hip  Special Testing: BACK - Seated SLR negative for radicular pain bilaterally  Strength: Bilateral hip flex/ext 5/5, knee flex/ext 5/5, ankle dorsiflex/plantarflex 5/5 Neurovascular: intact distal sensation to light touch  Neurological: He is alert and oriented to person, place, and time.  Skin: Skin is warm and dry. No rash noted. He is not diaphoretic. No erythema.  Psychiatric: He has a normal mood and affect. His behavior is normal.  Well groomed, good eye contact, normal speech and thoughts  Nursing note and vitals reviewed.  Results for orders placed or performed in visit on 07/29/17  CBC and differential  Result Value Ref Range   Hemoglobin 15.7 13.5 - 17.5   HCT 47 41 - 53   Platelets 173 150 - 399   WBC 8.2   Basic metabolic panel  Result Value Ref Range   Glucose 218    BUN 14 4 - 21   Creatinine 1.0 0.6 - 1.3   Potassium 3.8 3.4 - 5.3   Sodium 140 137 - 147  Lipid panel  Result Value Ref Range   Triglycerides 131 40 - 160   Cholesterol 123 0 - 200   HDL 61 35 - 70   LDL Cholesterol 61   Hemoglobin A1c  Result Value Ref Range   Hemoglobin A1C 8.2       Assessment & Plan:   Problem List Items Addressed This Visit    Chronic bilateral low back pain with left-sided sciatica - Primary   Relevant Medications   predniSONE (DELTASONE) 20 MG tablet   tiZANidine (ZANAFLEX) 2 MG tablet   Other  Relevant Orders   Ambulatory referral to Orthopedic Surgery   Primary osteoarthritis involving multiple joints   Relevant Medications   predniSONE (DELTASONE) 20 MG tablet   tiZANidine (ZANAFLEX) 2 MG tablet   Other Relevant Orders   Ambulatory referral to Orthopedic Surgery      Persistent chronic now bilateral LBP with some associated L sciatica. In setting of known episodic chronic LBP, known OA/DJD in other areas of spine C/T No prior back surgery Significantly improved temporarily on treatment with prednisone and other NSAIDs  Plan: 1. Given prior improvement will repeat course of steroid -  with prednisone taper 7 days - After prednisone then restart anti-inflammatory trial with rx Meloxicam 80m qd wc x 2-4 weeks, then PRN 2. STOP Baclofen switch to Tizanidine 253mmuscle relaxant PRN, titrate up as tolerated 3. May use Tylenol PRN for breakthrough - then regularly after flare  [facility] worsening bilateral L>R low back pain with history of arthritis, now with sciatica symptoms worsening, temporarily improved on prednisone and meloxicam in past, otherwise now worsening, defer X-ray today he requests 2nd opinion from orthopedic, not ideal candidate for physical therapy today due to pain, may benefit from future MRI.    Meds ordered this encounter  Medications  . predniSONE (DELTASONE) 20 MG tablet    Sig: Take daily with food. Start with 6054m3 pills) x 2 days, then reduce to 6m33m pills) x 2 days, then 20mg38mpill) x 3 days    Dispense:  13 tablet    Refill:  0  . DISCONTD: tizanidine (ZANAFLEX) 2 MG capsule    Sig: Take 1 capsule (2 mg total) by mouth 2 (two) times daily as needed for muscle spasms (back pain). Prefer to only take at bedtime as needed.    Dispense:  30 capsule    Refill:  1  . tiZANidine (ZANAFLEX) 2 MG tablet    Sig: Take 1 tablet (2 mg total) by mouth 2 (two) times daily as needed for  muscle spasms (pain).    Dispense:  30 tablet    Refill:  1    Capsules not covered by insurance, switched to tab on phone in   Orders Placed This Encounter  Procedures  . Ambulatory referral to Orthopedic Surgery    Referral Priority:   Routine    Referral Type:   Surgical    Referral Reason:   Specialty Services Required    Referred to Provider:   Menz,Hessie Knows   Requested Specialty:   Orthopedic Surgery    Number of Visits Requested:   1    Follow up plan: Return in about 6 weeks (around 11/27/2017), or if symptoms worsen or fail to improve, for Low Back Pain.  AlexaNobie PutnamSoWynnep 10/17/2017, 12:01 AM

## 2017-10-16 NOTE — Patient Instructions (Addendum)
Thank you for coming to the office today.  For your Back Pain - most likely degenerative arthritis in low back.  Referral to Orthopedics - they will call you - if not heard back by next week - call them  [facility] [place], [place]  [address] Phone: [phone]  Start prednisone taper over 7 days - After prednisone notify office if want to try more meloxicam or another few days of prednisone  STOP Baclofen  START Tizanidine 47m capsule at bedtime as needed muscle relaxant - if makes too drowsy can be cautious and stop med - may add 2nd dose during afternoon if tolerated - can double night-time dose in future if doing well.  Recommend to start taking Tylenol Extra Strength 5052mtabs - take 1 to 2 tabs per dose (max 100040mevery 6-8 hours for pain (take regularly, don't skip a dose for next 7 days), max 24 hour daily dose is 6 tablets or 3000m46mn the future you can repeat the same everyday Tylenol course for 1-2 weeks at a time.    This pain may take weeks to months to fully resolve, but hopefully it will respond to the medicine initially. All back injuries (small or serious) are slow to heal since we use our back muscles every day. Be careful with turning, twisting, lifting, sitting / standing for prolonged periods, and avoid re-injury.  If your symptoms significantly worsen with more pain, or new symptoms with weakness in one or both legs, new or different shooting leg pains, numbness in legs or groin, loss of control or retention of urine or bowel movements, please call back for advice and you may need to go directly to the Emergency Department.   Please schedule a Follow-up Appointment to: Return in about 6 weeks (around 11/27/2017), or if symptoms worsen or fail to improve, for Low Back Pain.  If you have any other questions or concerns, please feel free to call the office or send a message through [facility]  may also schedule an earlier appointment if necessary.  Additionally, you may be receiving a survey about your experience at our office within a few days to 1 week by e-mail or mail. We value your feedback.  AlexNobie Putnam [place]

## 2017-10-26 ENCOUNTER — Other Ambulatory Visit: Payer: Self-pay | Admitting: Family Medicine

## 2017-10-26 DIAGNOSIS — M8949 Other hypertrophic osteoarthropathy, multiple sites: Secondary | ICD-10-CM

## 2017-10-26 DIAGNOSIS — M150 Primary generalized (osteo)arthritis: Secondary | ICD-10-CM

## 2017-10-26 DIAGNOSIS — G8929 Other chronic pain: Secondary | ICD-10-CM

## 2017-10-26 DIAGNOSIS — M159 Polyosteoarthritis, unspecified: Secondary | ICD-10-CM

## 2017-10-26 DIAGNOSIS — M5442 Lumbago with sciatica, left side: Principal | ICD-10-CM

## 2017-10-26 MED ORDER — PREDNISONE 20 MG PO TABS: ORAL_TABLET | ORAL | 0 refills | Status: DC

## 2017-10-26 NOTE — Telephone Encounter (Signed)
Pt asked for another refill on prednisone.  He can't get appt for back pain until 21st.  His call back number is [phone]

## 2017-10-26 NOTE — Telephone Encounter (Signed)
The pt was notified. No questions or concerns.

## 2017-10-26 NOTE — Telephone Encounter (Signed)
Sent refill to [facility] for another repeat prednisone course.  Please notify patient that this was sent. I would advise him that he should wait at least 2-3 days between finishing last course and starting new one for 1 week.  Nobie Putnam, [facility] 10/26/2017, 1:31 PM

## 2017-11-04 DIAGNOSIS — M4307 Spondylolysis, lumbosacral region: Secondary | ICD-10-CM | POA: Diagnosis not present

## 2017-11-04 DIAGNOSIS — M25551 Pain in right hip: Secondary | ICD-10-CM | POA: Diagnosis not present

## 2017-11-04 DIAGNOSIS — M545 Low back pain: Secondary | ICD-10-CM | POA: Diagnosis not present

## 2017-11-19 ENCOUNTER — Other Ambulatory Visit: Payer: Self-pay | Admitting: Family Medicine

## 2017-11-19 DIAGNOSIS — E785 Hyperlipidemia, unspecified: Secondary | ICD-10-CM

## 2017-11-19 DIAGNOSIS — I10 Essential (primary) hypertension: Secondary | ICD-10-CM

## 2017-12-09 ENCOUNTER — Telehealth: Payer: Self-pay | Admitting: Family Medicine

## 2017-12-09 NOTE — Telephone Encounter (Signed)
Declined AWV at this time

## 2018-02-26 ENCOUNTER — Other Ambulatory Visit: Payer: Self-pay | Admitting: Cardiovascular Disease

## 2018-03-05 ENCOUNTER — Telehealth: Payer: Self-pay | Admitting: Cardiovascular Disease

## 2018-03-05 ENCOUNTER — Other Ambulatory Visit: Payer: Self-pay

## 2018-03-05 MED ORDER — AMLODIPINE BESYLATE 10 MG PO TABS: 10.0000 mg | ORAL_TABLET | Freq: Every day | ORAL | 2 refills | Status: DC

## 2018-03-05 NOTE — Telephone Encounter (Signed)
Medication resent to correct pharmacy   amLODipine (NORVASC) 10 MG tablet 90 tablet 2 03/05/2018 03/05/2019   Sig - Route: Take 1 tablet (10 mg total) by mouth daily. - Oral   Sent to pharmacy as: amLODipine (NORVASC) 10 MG tablet   E-Prescribing Status: Receipt confirmed by pharmacy (03/05/2018 4:32 PM EST)   Ph[facility]   [id number] [place] [address]

## 2018-03-05 NOTE — Telephone Encounter (Signed)
amLODipine (NORVASC) 10 MG tablet 90 tablet 2 03/05/2018 03/05/2019   Sig - Route: Take 1 tablet (10 mg total) by mouth daily. - Oral   Sent to pharmacy as: amLODipine (NORVASC) 10 MG tablet   E-Prescribing Status: Receipt confirmed by pharmacy (03/05/2018 4:32 PM EST)   [facility]   [id number] [place] [address]

## 2018-03-05 NOTE — Telephone Encounter (Signed)
*  STAT* If patient is at the pharmacy, call can be transferred to refill team.   1. Which medications need to be refilled? (please list name of each medication and dose if known)  amlodipine 10 MG -1 tablet daily   2. Which pharmacy/location (including street and city if local pharmacy) is [facility] sent [facility] [place]   3. Do they need a 30 day or 90 day supply? 90 day

## 2018-05-20 ENCOUNTER — Telehealth: Payer: Self-pay | Admitting: Family Medicine

## 2018-05-20 DIAGNOSIS — I10 Essential (primary) hypertension: Secondary | ICD-10-CM

## 2018-05-20 DIAGNOSIS — E785 Hyperlipidemia, unspecified: Secondary | ICD-10-CM

## 2018-05-20 NOTE — Telephone Encounter (Signed)
Pt called requesting refill on  Lisinopril 30 mg, atorvastatin 40 mg [facility]. Pt  call back  # is  [phone]

## 2018-05-21 MED ORDER — ATORVASTATIN CALCIUM 40 MG PO TABS: 40.0000 mg | ORAL_TABLET | Freq: Every day | ORAL | 0 refills | Status: DC

## 2018-05-21 MED ORDER — LISINOPRIL 30 MG PO TABS: 30.0000 mg | ORAL_TABLET | Freq: Every day | ORAL | 0 refills | Status: DC

## 2018-05-21 NOTE — Telephone Encounter (Signed)
Patient has mostly acute appointments in past, don't see any follow up appointments don't know if you want to refill this one ?

## 2018-05-21 NOTE — Telephone Encounter (Signed)
Patient advised.

## 2018-05-21 NOTE — Telephone Encounter (Signed)
Ordered Lisinopril and Atorvastatin for 90 day supply to [facility]. No refills. He should schedule follow-up Diabetes, HTN and med refill for anytime in next 90 days to authorize more refills.  Stanley Mclean, [facility] 05/21/2018, 5:01 PM

## 2018-08-16 ENCOUNTER — Other Ambulatory Visit: Payer: Self-pay | Admitting: Family Medicine

## 2018-08-16 DIAGNOSIS — IMO0002 Reserved for concepts with insufficient information to code with codable children: Secondary | ICD-10-CM

## 2018-08-16 DIAGNOSIS — E1151 Type 2 diabetes mellitus with diabetic peripheral angiopathy without gangrene: Secondary | ICD-10-CM

## 2018-08-19 ENCOUNTER — Other Ambulatory Visit: Payer: Self-pay | Admitting: Family Medicine

## 2018-08-19 DIAGNOSIS — I10 Essential (primary) hypertension: Secondary | ICD-10-CM

## 2018-08-19 DIAGNOSIS — E785 Hyperlipidemia, unspecified: Secondary | ICD-10-CM

## 2018-10-01 ENCOUNTER — Other Ambulatory Visit: Payer: Self-pay | Admitting: Cardiovascular Disease

## 2018-10-05 ENCOUNTER — Other Ambulatory Visit: Payer: Self-pay | Admitting: Family Medicine

## 2018-10-05 DIAGNOSIS — E785 Hyperlipidemia, unspecified: Secondary | ICD-10-CM

## 2018-10-05 DIAGNOSIS — I10 Essential (primary) hypertension: Secondary | ICD-10-CM

## 2018-10-08 DIAGNOSIS — Z1211 Encounter for screening for malignant neoplasm of colon: Secondary | ICD-10-CM | POA: Diagnosis not present

## 2018-10-08 LAB — FECAL OCCULT BLOOD, IMMUNOCHEMICAL: IFOBT: NEGATIVE

## 2018-10-09 ENCOUNTER — Other Ambulatory Visit: Payer: Self-pay | Admitting: Cardiovascular Disease

## 2018-10-14 NOTE — Progress Notes (Deleted)
Cardiology Office Note    Date:  10/14/2018   ID:  Stanley Mclean, Stanley Mclean April 30, 1940, MRN [id number]  PCP:  Olin Hauser, DO  Cardiologist:  Ida Rogue, MD  Electrophysiologist:  None   Chief Complaint: Follow up  History of Present Illness:   Stanley Mclean is a 78 y.o. male with history of ***  Past Medical History:  Diagnosis Date  . Anemia   . BPH (benign prostatic hyperplasia)   . CAD (coronary artery disease)    a. s/p multiple prior PCIs b. s/p DES-distal RCA 05/2013  . COPD (chronic obstructive pulmonary disease) ([place])   . GERD (gastroesophageal reflux disease)   . Macular degeneration   . Pure hypercholesterolemia   . Stroke ([place])   . Tobacco abuse   . Traumatic amputation of leg(s) (complete) (partial), unilateral, level not specified, without mention of complication 33/61/2244   Right Below Knee Amputation  . Unspecified essential hypertension     Past Surgical History:  Procedure Laterality Date  . AMPUTATION Right 08/26/2011   Below Knee Amputee  . APPENDECTOMY    . CARDIAC CATHETERIZATION    . CAROTID ANGIOGRAPHY Right 05/14/2016   Procedure: Carotid Angiography;  Surgeon: Katha Cabal, MD;  Location: [facility];  Service: Cardiovascular;  Laterality: Right;  . CAROTID PTA/STENT INTERVENTION  05/14/2016   Procedure: Carotid PTA/Stent Intervention;  Surgeon: Katha Cabal, MD;  Location: [facility];  Service: Cardiovascular;;  . CORONARY ANGIOPLASTY WITH STENT PLACEMENT  09/28/2006   RCA  . CORONARY ANGIOPLASTY WITH STENT PLACEMENT  07/21/2006   Mid LAD  . CORONARY ANGIOPLASTY WITH STENT PLACEMENT  06/06/2013   30% prox LAD at the site of a prior stent, 40% mid LAD at the origin of D1, 40% ostial D1, 30% prox LCx at the site of a prior stent, in the proximal third of the vessel segment, 20% prox RCA, 99% distal RCA s/p DES. EF >55%.    Current Medications: No outpatient medications have been marked as taking for  the 10/15/18 encounter (Appointment) with Rise Mu, PA-C.   ***   Allergies:   Patient has no known allergies.   Social History   Socioeconomic History  . Marital status: Married    Spouse name: Not on file  . Number of children: Not on file  . Years of education: Not on file  . Highest education level: Not on file  Occupational History  . Not on file  Social Needs  . Financial resource strain: Not on file  . Food insecurity    Worry: Not on file    Inability: Not on file  . Transportation needs    Medical: Not on file    Non-medical: Not on file  Tobacco Use  . Smoking status: Current Every Day Smoker    Packs/day: 1.00    Years: 50.00    Pack years: 50.00    Types: Cigarettes    Last attempt to quit: 03/29/2016    Years since quitting: 2.5  . Smokeless tobacco: Never Used  Substance and Sexual Activity  . Alcohol use: No  . Drug use: No  . Sexual activity: Never  Lifestyle  . Physical activity    Days per week: Not on file    Minutes per session: Not on file  . Stress: Not on file  Relationships  . Social Herbalist on phone: Not on file    Gets together: Not on  file    Attends religious service: Not on file    Active member of club or organization: Not on file    Attends meetings of clubs or organizations: Not on file    Relationship status: Not on file  Other Topics Concern  . Not on file  Social History Narrative  . Not on file     Family History:  The patient's ***family history includes Mental illness in his mother.  ROS:   ROS   EKGs/Labs/Other Studies Reviewed:    Studies reviewed were summarized above. The additional studies were reviewed today: ***  EKG:  EKG is *** ordered today.  The EKG ordered today demonstrates ***  Recent Labs: No results found for requested labs within last 8760 hours.  Recent Lipid Panel    Component Value Date/Time   CHOL 123 07/07/2017   CHOL 166 12/29/2013 1033   CHOL 175 06/01/2013 1133    TRIG 131 07/07/2017   TRIG 211 (H) 06/01/2013 1133   HDL 61 07/07/2017   HDL 43 12/29/2013 1033   HDL 39 (L) 06/01/2013 1133   CHOLHDL 3.3 03/30/2016 0559   VLDL 43 (H) 03/30/2016 0559   VLDL 42 (H) 06/01/2013 1133   LDLCALC 61 07/07/2017   LDLCALC 69 12/29/2013 1033   LDLCALC 94 06/01/2013 1133    PHYSICAL EXAM:    VS:  There were no vitals taken for this visit.  BMI: There is no height or weight on file to calculate BMI.  Physical Exam  Wt Readings from Last 3 Encounters:  10/16/17 135 lb (61.2 kg)  10/13/17 135 lb 8 oz (61.5 kg)  07/29/17 137 lb (62.1 kg)     ASSESSMENT & PLAN:   1. ***  Disposition: F/u with ***   Medication Adjustments/Labs and Tests Ordered: Current medicines are reviewed at length with the patient today.  Concerns regarding medicines are outlined above. Medication changes, Labs and Tests ordered today are summarized above and listed in the Patient Instructions accessible in Encounters.   Signed, Christell Faith, PA-C 10/14/2018 8:12 AM     [address], [place] [address] [phone]

## 2018-10-15 ENCOUNTER — Ambulatory Visit: Payer: PPO | Admitting: Physician Assistant

## 2018-10-22 ENCOUNTER — Other Ambulatory Visit: Payer: Self-pay | Admitting: *Deleted

## 2018-10-22 MED ORDER — AMLODIPINE BESYLATE 10 MG PO TABS: 10.0000 mg | ORAL_TABLET | Freq: Every day | ORAL | 0 refills | Status: DC

## 2018-11-16 ENCOUNTER — Telehealth: Payer: Self-pay | Admitting: Cardiovascular Disease

## 2018-11-16 NOTE — Telephone Encounter (Signed)
Virtual Visit Pre-Appointment Phone Call  "(Name), I am calling you today to discuss your upcoming appointment. We are currently trying to limit exposure to the virus that causes COVID-19 by seeing patients at home rather than in the office."  1. "What is the BEST phone number to call the day of the visit?" - include this in appointment notes  2. Do you have or have access to (through a family member/friend) a smartphone with video capability that we can use for your visit?" a. If yes - list this number in appt notes as cell (if different from BEST phone #) and list the appointment type as a VIDEO visit in appointment notes b. If no - list the appointment type as a PHONE visit in appointment notes  3. Confirm consent - "In the setting of the current Covid19 crisis, you are scheduled for a (phone or video) visit with your provider on (date) at (time).  Just as we do with many in-office visits, in order for you to participate in this visit, we must obtain consent.  If you'd like, I can send this to your mychart (if signed up) or email for you to review.  Otherwise, I can obtain your verbal consent now.  All virtual visits are billed to your insurance company just like a normal visit would be.  By agreeing to a virtual visit, we'd like you to understand that the technology does not allow for your provider to perform an examination, and thus may limit your provider's ability to fully assess your condition. If your provider identifies any concerns that need to be evaluated in person, we will make arrangements to do so.  Finally, though the technology is pretty good, we cannot assure that it will always work on either your or our end, and in the setting of a video visit, we may have to convert it to a phone-only visit.  In either situation, we cannot ensure that we have a secure connection.  Are you willing to proceed?" STAFF: Did the patient verbally acknowledge consent to telehealth visit? Document  YES/NO here: YES  4. Advise patient to be prepared - "Two hours prior to your appointment, go ahead and check your blood pressure, pulse, oxygen saturation, and your weight (if you have the equipment to check those) and write them all down. When your visit starts, your provider will ask you for this information. If you have an Apple Watch or Kardia device, please plan to have heart rate information ready on the day of your appointment. Please have a pen and paper handy nearby the day of the visit as well."  5. Give patient instructions for MyChart download to smartphone OR Doximity/Doxy.me as below if video visit (depending on what platform provider is using)  6. Inform patient they will receive a phone call 15 minutes prior to their appointment time (may be from unknown caller ID) so they should be prepared to answer    TELEPHONE CALL NOTE  Stanley Mclean has been deemed a candidate for a follow-up tele-health visit to limit community exposure during the Covid-19 pandemic. I spoke with the patient via phone to ensure availability of phone/video source, confirm preferred email & phone number, and discuss instructions and expectations.  I reminded Stanley Mclean to be prepared with any vital sign and/or heart rhythm information that could potentially be obtained via home monitoring, at the time of his visit. I reminded Stanley Mclean to expect a phone call prior to  his visit.  [name] 11/16/2018 2:28 PM   INSTRUCTIONS FOR DOWNLOADING THE MYCHART APP TO SMARTPHONE  - The patient must first make sure to have activated MyChart and know their login information - If Apple, go to [employer] and type in MyChart in the search bar and download the app. If Android, ask patient to go to [employer] and type in D'Lo in the search bar and download the app. The app is free but as with any other app downloads, their phone may require them to verify saved payment information or Apple/Android  password.  - The patient will need to then log into the app with their MyChart username and password, and select [facility] as their healthcare provider to link the account. When it is time for your visit, go to the MyChart app, find appointments, and click Begin Video Visit. Be sure to Select Allow for your device to access the Microphone and Camera for your visit. You will then be connected, and your provider will be with you shortly.  **If they have any issues connecting, or need assistance please contact MyChart service desk [phone])**  **If using a computer, in order to ensure the best quality for their visit they will need to use either of the following Internet Browsers: [employer], or Google Chrome**  IF USING DOXIMITY or DOXY.ME - The patient will receive a link just prior to their visit by text.     FULL LENGTH CONSENT FOR TELE-HEALTH VISIT   I hereby voluntarily request, consent and authorize [facility] and its employed or contracted physicians, physician assistants, nurse practitioners or other licensed health care professionals (the Practitioner), to provide me with telemedicine health care services (the Services") as deemed necessary by the treating Practitioner. I acknowledge and consent to receive the Services by the Practitioner via telemedicine. I understand that the telemedicine visit will involve communicating with the Practitioner through live audiovisual communication technology and the disclosure of certain medical information by electronic transmission. I acknowledge that I have been given the opportunity to request an in-person assessment or other available alternative prior to the telemedicine visit and am voluntarily participating in the telemedicine visit.  I understand that I have the right to withhold or withdraw my consent to the use of telemedicine in the course of my care at any time, without affecting my right to future care or treatment,  and that the Practitioner or I may terminate the telemedicine visit at any time. I understand that I have the right to inspect all information obtained and/or recorded in the course of the telemedicine visit and may receive copies of available information for a reasonable fee.  I understand that some of the potential risks of receiving the Services via telemedicine include:   Delay or interruption in medical evaluation due to technological equipment failure or disruption;  Information transmitted may not be sufficient (e.g. poor resolution of images) to allow for appropriate medical decision making by the Practitioner; and/or   In rare instances, security protocols could fail, causing a breach of personal health information.  Furthermore, I acknowledge that it is my responsibility to provide information about my medical history, conditions and care that is complete and accurate to the best of my ability. I acknowledge that Practitioner's advice, recommendations, and/or decision may be based on factors not within their control, such as incomplete or inaccurate data provided by me or distortions of diagnostic images or specimens that may result from electronic transmissions. I  understand that the practice of medicine is not an exact science and that Practitioner makes no warranties or guarantees regarding treatment outcomes. I acknowledge that I will receive a copy of this consent concurrently upon execution via email to the email address I last provided but may also request a printed copy by calling the office of [facility].    I understand that my insurance will be billed for this visit.   I have read or had this consent read to me.  I understand the contents of this consent, which adequately explains the benefits and risks of the Services being provided via telemedicine.   I have been provided ample opportunity to ask questions regarding this consent and the Services and have had my questions  answered to my satisfaction.  I give my informed consent for the services to be provided through the use of telemedicine in my medical care  By participating in this telemedicine visit I agree to the above.

## 2018-11-24 ENCOUNTER — Ambulatory Visit: Payer: PPO | Admitting: Cardiovascular Disease

## 2018-11-27 ENCOUNTER — Other Ambulatory Visit: Payer: Self-pay | Admitting: Cardiovascular Disease

## 2018-11-30 NOTE — Progress Notes (Signed)
Virtual Visit via Video Note   This visit type was conducted due to national recommendations for restrictions regarding the COVID-19 Pandemic (e.g. social distancing) in an effort to limit this patient's exposure and mitigate transmission in our community.  Due to his co-morbid illnesses, this patient is at least at moderate risk for complications without adequate follow up.  This format is felt to be most appropriate for this patient at this time.  All issues noted in this document were discussed and addressed.  A limited physical exam was performed with this format.  Please refer to the patient's chart for his consent to telehealth for [facility].   I connected with  Stanley Mclean on 12/01/18 by a video enabled telemedicine application and verified that I am speaking with the correct person using two identifiers. I discussed the limitations of evaluation and management by telemedicine. The patient expressed understanding and agreed to proceed.   Evaluation Performed:  Follow-up visit  Date:  12/01/2018   ID:  Stanley Mclean, DOB [birth year], MRN [id number]  Patient Location:  [place] [address]   Provider location:   [facility], [place] office  PCP:  Olin Hauser, DO  Cardiologist:  [facility]   Chief Complaint:  SOB   History of Present Illness:    Stanley Mclean is a 78 y.o. male who presents via audio/video conferencing for a telehealth visit today.   The patient does not symptoms concerning for COVID-19 infection (fever, chills, cough, or new SHORTNESS OF BREATH).   Patient has a past medical history of CVA DM II long smoking history who continues to smoke one pack per day,  coronary artery disease, stent to his mid LAD, 2 stents to  proximal LCX in May 2008, stent  proximal RCA July 2008, previous anginal symptoms  --- cardiac catheterization 06/06/2013, found to have severe distal RCA disease, mild in-stent  restenosis in the circumflex and LAD, normal ejection fraction. 40% mid LAD disease, 40% B1 disease, 30% proximal circumflex also noted. He had Xience stent 2.5 x 15 mm placed in the distal RCA.  amputation of the right lower extremity from lawnmower accident 2 years ago who presents for routine followup of his coronary artery disease  No new conmplaints Continues to smoke one pack per day SOB, chronic stable Chronic back pain, little better No exercise  Takes metformin once a day  No new CVA or TIA  No new labs Has not seen Dr. Raliegh Ip  HBA1C 8.2  Total chol 131  No recent episodes of bronchitis requiring antibiotics No regular exercise   Blood pressure stable No  TIA or stroke symptoms Denies any palpitations concerning for arrhythmia  Previously declined cardiac catheterization for symptoms of chest pain  Other past medical history reviewed CVA  03/2016   left arm deficits, Still only at 80% to 90% in terms of left hand strength  Other past medical history reviewed MRI results  Acute 1 cm nonhemorrhagic deep white matter infarct, RIGHT hemisphere. Atrophy and small vessel disease. Apparent high-grade stenosis of the vertical petrous segment RIGHT ICA, likely focal atherosclerosis. Intracranial atherosclerotic disease, most notably affecting the RIGHT greater than LEFT M2 and M3 MCA branches.  CT neck  Plaque right carotid bifurcation with 64% maximal diameter stenosis at the junction of the right common carotid artery and proximal right internal carotid artery.  Plaque pre cavernous and cavernous segment right internal carotid artery with moderate narrowing.  Calcified plaque left carotid bifurcation. Calcified and noncalcified plaque proximal left internal carotid artery. Less than 60% diameter stenosis.  Calcified plaque left internal carotid artery cavernous segment with mild narrowing.  Cath by Dr. Ronalee Belts performed showing   50% RICA with normal distal right  ICA  History of neuropathy.   Prior CV studies:   The following studies were reviewed today:    Past Medical History:  Diagnosis Date  . Anemia   . BPH (benign prostatic hyperplasia)   . CAD (coronary artery disease)    a. s/p multiple prior PCIs b. s/p DES-distal RCA 05/2013  . COPD (chronic obstructive pulmonary disease) ([place])   . GERD (gastroesophageal reflux disease)   . Macular degeneration   . Pure hypercholesterolemia   . Stroke ([place])   . Tobacco abuse   . Traumatic amputation of leg(s) (complete) (partial), unilateral, level not specified, without mention of complication 19/75/8832   Right Below Knee Amputation  . Unspecified essential hypertension    Past Surgical History:  Procedure Laterality Date  . AMPUTATION Right 08/26/2011   Below Knee Amputee  . APPENDECTOMY    . CARDIAC CATHETERIZATION    . CAROTID ANGIOGRAPHY Right 05/14/2016   Procedure: Carotid Angiography;  Surgeon: Katha Cabal, MD;  Location: [facility];  Service: Cardiovascular;  Laterality: Right;  . CAROTID PTA/STENT INTERVENTION  05/14/2016   Procedure: Carotid PTA/Stent Intervention;  Surgeon: Katha Cabal, MD;  Location: [facility];  Service: Cardiovascular;;  . CORONARY ANGIOPLASTY WITH STENT PLACEMENT  09/28/2006   RCA  . CORONARY ANGIOPLASTY WITH STENT PLACEMENT  07/21/2006   Mid LAD  . CORONARY ANGIOPLASTY WITH STENT PLACEMENT  06/06/2013   30% prox LAD at the site of a prior stent, 40% mid LAD at the origin of D1, 40% ostial D1, 30% prox LCx at the site of a prior stent, in the proximal third of the vessel segment, 20% prox RCA, 99% distal RCA s/p DES. EF >55%.      Allergies:   Patient has no known allergies.   Social History   Tobacco Use  . Smoking status: Current Every Day Smoker    Packs/day: 1.00    Years: 50.00    Pack years: 50.00    Types: Cigarettes    Last attempt to quit: 03/29/2016    Years since quitting: 2.6  . Smokeless tobacco: Never  Used  Substance Use Topics  . Alcohol use: No  . Drug use: No     Current Outpatient Medications on File Prior to Visit  Medication Sig Dispense Refill  . amLODipine (NORVASC) 10 MG tablet Take 1 tablet (10 mg total) by mouth daily. 90 tablet 0  . atorvastatin (LIPITOR) 40 MG tablet TAKE 1 TABLET BY MOUTH EVERY DAY 90 tablet 0  . budesonide-formoterol (SYMBICORT) 160-4.5 MCG/ACT inhaler Inhale 2 puffs into the lungs 2 (two) times daily. 3 Inhaler 3  . lisinopril (ZESTRIL) 30 MG tablet TAKE 1 TABLET BY MOUTH EVERY DAY 90 tablet 0  . metFORMIN (GLUCOPHAGE) 500 MG tablet TAKE 1 TABLET BY MOUTH TWICE A DAY WITH A MEAL 180 tablet 1  . metoprolol tartrate (LOPRESSOR) 50 MG tablet TAKE 1 TABLET BY MOUTH TWICE A DAY 60 tablet 0  . nitroGLYCERIN (NITROSTAT) 0.4 MG SL tablet Place 1 tablet (0.4 mg total) under the tongue every 5 (five) minutes as needed for chest pain. 25 tablet 0  . terazosin (HYTRIN) 1 MG capsule TAKE 1 CAPSULE BY MOUTH EVERY NIGHT  AT BEDTIME - APPOINTMENT NEEDED 30 capsule 0  . tiZANidine (ZANAFLEX) 2 MG tablet Take 1 tablet (2 mg total) by mouth 2 (two) times daily as needed for muscle spasms (pain). 30 tablet 1  . albuterol (PROVENTIL HFA;VENTOLIN HFA) 108 (90 Base) MCG/ACT inhaler Inhale 2 puffs into the lungs every 6 (six) hours as needed for wheezing or shortness of breath. (Patient taking differently: Inhale 2 puffs into the lungs every 6 (six) hours as needed for wheezing or shortness of breath. ) 6 Inhaler 1  . aspirin EC 81 MG tablet Take 1 tablet (81 mg total) by mouth daily. 90 tablet 3   No current facility-administered medications on file prior to visit.      Family Hx: The patient's family history includes Mental illness in his mother.  ROS:   Please see the history of present illness.    Review of Systems  Constitutional: Negative.   HENT: Negative.   Respiratory: Negative.   Cardiovascular: Negative.   Gastrointestinal: Negative.   Musculoskeletal:  Negative.   Neurological: Negative.   Psychiatric/Behavioral: Negative.   All other systems reviewed and are negative.    Labs/Other Tests and Data Reviewed:    Recent Labs: No results found for requested labs within last 8760 hours.   Recent Lipid Panel Lab Results  Component Value Date/Time   CHOL 123 07/07/2017   CHOL 166 12/29/2013 10:33 AM   CHOL 175 06/01/2013 11:33 AM   TRIG 131 07/07/2017   TRIG 211 (H) 06/01/2013 11:33 AM   HDL 61 07/07/2017   HDL 43 12/29/2013 10:33 AM   HDL 39 (L) 06/01/2013 11:33 AM   CHOLHDL 3.3 03/30/2016 05:59 AM   LDLCALC 61 07/07/2017   LDLCALC 69 12/29/2013 10:33 AM   LDLCALC 94 06/01/2013 11:33 AM    Wt Readings from Last 3 Encounters:  12/01/18 140 lb (63.5 kg)  10/16/17 135 lb (61.2 kg)  10/13/17 135 lb 8 oz (61.5 kg)     Exam:    Vital Signs: Vital signs may also be detailed in the HPI BP 138/84   Ht _0  (1.753 m)   Wt 140 lb (63.5 kg)   BMI 20.67 kg/m   Wt Readings from Last 3 Encounters:  12/01/18 140 lb (63.5 kg)  10/16/17 135 lb (61.2 kg)  10/13/17 135 lb 8 oz (61.5 kg)   Temp Readings from Last 3 Encounters:  10/16/17 98.9 F (37.2 C) (Oral)  07/29/17 98.3 F (36.8 C) (Oral)  11/19/16 98.1 F (36.7 C) (Oral)   BP Readings from Last 3 Encounters:  12/01/18 138/84  10/16/17 140/60  10/13/17 134/60   Pulse Readings from Last 3 Encounters:  10/16/17 73  10/13/17 67  07/29/17 62     Well nourished, well developed male in no acute distress. Constitutional:  oriented to person, place, and time. No distress.  Head: Normocephalic and atraumatic.  Eyes:  no discharge. No scleral icterus.  Neck: Normal range of motion. Neck supple.  Pulmonary/Chest: No audible wheezing, no distress, appears comfortable Musculoskeletal: Normal range of motion.  no  tenderness or deformity.  Neurological:   Coordination normal. Full exam not performed Skin:  No rash Psychiatric:  normal mood and affect. behavior is normal.  Thought content normal.    ASSESSMENT & PLAN:    Problem List Items Addressed This Visit      Cardiology Problems   DM (diabetes mellitus), type 2, uncontrolled, periph vascular complic (HCC)   Relevant Medications   aspirin EC  81 MG tablet   Carotid artery stenosis   Relevant Medications   aspirin EC 81 MG tablet   Essential hypertension   Relevant Medications   aspirin EC 81 MG tablet   Hyperlipidemia   Relevant Medications   aspirin EC 81 MG tablet   Coronary artery disease of native artery of native heart with stable angina pectoris (HCC) - Primary   Relevant Medications   aspirin EC 81 MG tablet     Other   COPD (chronic obstructive pulmonary disease) (HCC)   Smoker   SOB (shortness of breath)    Other Visit Diagnoses    Carotid stenosis, symptomatic, with infarction (HCC)       Relevant Medications   aspirin EC 81 MG tablet     CAD with stable angina High risk of progression of his disease given continued smoking 1 pack/day Nothing we can say or do to dissuade him from smoking Recommended he call us for any worsening anginal symptoms Prior stent 2015, results from the catheterization at that time reviewed with him  Smoker Emphysema/COPD Discussed monitoring his oxygen level, may need oxygen in the future for hypoxia on ambulation Strongly encouraged cessation  Diabetes type 2 with complications Taking metformin only once a day Reports that somebody came out checked his sugars and it was running better Will defer lab work to primary care    COVID-19 Education: The signs and symptoms of COVID-19 were discussed with the patient and how to seek care for testing (follow up with PCP or arrange E-visit).  The importance of social distancing was discussed today.  Patient Risk:   After full review of this patients clinical status, I feel that they are at least moderate risk at this time.  Time:   Today, I have spent 25 minutes with the patient with telehealth  technology discussing the cardiac and medical problems/diagnoses detailed above   Additional 10 min spent reviewing the chart prior to patient visit today   Medication Adjustments/Labs and Tests Ordered: Current medicines are reviewed at length with the patient today.  Concerns regarding medicines are outlined above.   Tests Ordered: No tests ordered   Medication Changes: No changes made   Disposition: Follow-up in 12 months   Signed, Ida Rogue, MD  [address] [facility] [address] [place] [address], [place], [place] [address]

## 2018-12-01 ENCOUNTER — Encounter: Payer: Self-pay | Admitting: Cardiovascular Disease

## 2018-12-01 ENCOUNTER — Other Ambulatory Visit: Payer: Self-pay

## 2018-12-01 ENCOUNTER — Telehealth (INDEPENDENT_AMBULATORY_CARE_PROVIDER_SITE_OTHER): Payer: PPO | Admitting: Cardiovascular Disease

## 2018-12-01 VITALS — BP 138/84 | Ht 69.0 in | Wt 140.0 lb

## 2018-12-01 DIAGNOSIS — E1151 Type 2 diabetes mellitus with diabetic peripheral angiopathy without gangrene: Secondary | ICD-10-CM | POA: Diagnosis not present

## 2018-12-01 DIAGNOSIS — R0602 Shortness of breath: Secondary | ICD-10-CM

## 2018-12-01 DIAGNOSIS — E782 Mixed hyperlipidemia: Secondary | ICD-10-CM | POA: Diagnosis not present

## 2018-12-01 DIAGNOSIS — I63239 Cerebral infarction due to unspecified occlusion or stenosis of unspecified carotid arteries: Secondary | ICD-10-CM | POA: Diagnosis not present

## 2018-12-01 DIAGNOSIS — J432 Centrilobular emphysema: Secondary | ICD-10-CM | POA: Diagnosis not present

## 2018-12-01 DIAGNOSIS — I10 Essential (primary) hypertension: Secondary | ICD-10-CM | POA: Diagnosis not present

## 2018-12-01 DIAGNOSIS — I6523 Occlusion and stenosis of bilateral carotid arteries: Secondary | ICD-10-CM

## 2018-12-01 DIAGNOSIS — I25118 Atherosclerotic heart disease of native coronary artery with other forms of angina pectoris: Secondary | ICD-10-CM

## 2018-12-01 DIAGNOSIS — E1165 Type 2 diabetes mellitus with hyperglycemia: Secondary | ICD-10-CM | POA: Diagnosis not present

## 2018-12-01 DIAGNOSIS — F17200 Nicotine dependence, unspecified, uncomplicated: Secondary | ICD-10-CM | POA: Diagnosis not present

## 2018-12-01 DIAGNOSIS — IMO0002 Reserved for concepts with insufficient information to code with codable children: Secondary | ICD-10-CM

## 2018-12-01 NOTE — Patient Instructions (Addendum)
Medication Instructions:  No changes  If you need a refill on your cardiac medications before your next appointment, please call your pharmacy.    Lab work: No new labs needed   If you have labs (blood work) drawn today and your tests are completely normal, you will receive your results only by: Marland Kitchen MyChart Message (if you have MyChart) OR . A paper copy in the mail If you have any lab test that is abnormal or we need to change your treatment, we will call you to review the results.   Testing/Procedures: No new testing needed   Follow-Up: At [facility], you and your health needs are our priority.  As part of our continuing mission to provide you with exceptional heart care, we have created designated Provider Care Teams.  These Care Teams include your primary Cardiologist (physician) and Advanced Practice Providers (APPs -  Physician Assistants and Nurse Practitioners) who all work together to provide you with the care you need, when you need it.  . You will need a follow up appointment in 12 months (September 2021) .   Please call our office 2 months in advance to schedule this appointment.  (Call in early July to schedule)  . Providers on your designated Care Team:   . Murray Hodgkins, NP . Christell Faith, PA-C . Marrianne Mood, PA-C  Any Other Special Instructions Will Be Listed Below (If Applicable).  For educational health videos Log in to : [email or web address] Or : [email or web address], password : [email or web address]

## 2018-12-20 ENCOUNTER — Telehealth: Payer: Self-pay | Admitting: Family Medicine

## 2018-12-20 NOTE — Chronic Care Management (AMB) (Signed)
Chronic Care Management   Outreach Note  12/20/2018 Name: Stanley Mclean MRN: [id number] DOB: 08-02-1940  Referred by: Olin Hauser, DO Reason for referral : Chronic Care Management (Initial CCM outreach was unsuccessful)   An unsuccessful telephone outreach was attempted today. The patient was referred to the case management team by for assistance with care management and care coordination.   Follow Up Plan: A HIPPA compliant phone message was left for the patient providing contact information and requesting a return call.  The care management team will reach out to the patient again over the next 7 days.  If patient returns call to provider office, please advise to call [facility] at [place]  [email or web address]   ??[phone]

## 2018-12-22 ENCOUNTER — Other Ambulatory Visit: Payer: Self-pay | Admitting: Cardiovascular Disease

## 2018-12-27 NOTE — Chronic Care Management (AMB) (Signed)
Chronic Care Management  ° °Note ° °12/27/2018 °Name: Stanley Mclean MRN: [id number] DOB: 10/27/1940 ° °Stanley Mclean is a 78 y.o. year old male who is a primary care patient of Karamalegos, Alexander J, DO. I reached out to Imri A Wareing by phone today in response to a referral sent by Stanley Mclean's health plan.    ° °Stanley Mclean was given information about Chronic Care Management services today including:  °1. CCM service includes personalized support from designated clinical staff supervised by his physician, including individualized plan of care and coordination with other care providers °2. 24/7 contact phone numbers for assistance for urgent and routine care needs. °3. Service will only be billed when office clinical staff spend 20 minutes or more in a month to coordinate care. °4. Only one practitioner may furnish and bill the service in a calendar month. °5. The patient may stop CCM services at any time (effective at the end of the month) by phone call to the office staff. °6. The patient will be responsible for cost sharing (co-pay) of up to 20% of the service fee (after annual deductible is met). ° °Patient agreed to services and verbal consent obtained.  ° °Follow up plan: °Telephone appointment with CCM team member scheduled for: 12/31/2018 ° °Bernice Cicero °Care Guide • [facility] °[place]   [facility]  °??[email or web address]   ??[phone]   ° ° ° °

## 2018-12-31 ENCOUNTER — Telehealth: Payer: PPO

## 2019-01-03 ENCOUNTER — Encounter: Payer: Self-pay | Admitting: Family Medicine

## 2019-02-04 ENCOUNTER — Other Ambulatory Visit: Payer: Self-pay

## 2019-03-03 ENCOUNTER — Other Ambulatory Visit: Payer: Self-pay | Admitting: Cardiovascular Disease

## 2019-03-04 ENCOUNTER — Other Ambulatory Visit: Payer: Self-pay | Admitting: Family Medicine

## 2019-03-04 DIAGNOSIS — E1151 Type 2 diabetes mellitus with diabetic peripheral angiopathy without gangrene: Secondary | ICD-10-CM

## 2019-03-04 DIAGNOSIS — IMO0002 Reserved for concepts with insufficient information to code with codable children: Secondary | ICD-10-CM

## 2019-03-04 NOTE — Telephone Encounter (Signed)
Needs appointment

## 2019-03-05 ENCOUNTER — Other Ambulatory Visit: Payer: Self-pay | Admitting: Family Medicine

## 2019-03-05 DIAGNOSIS — I10 Essential (primary) hypertension: Secondary | ICD-10-CM

## 2019-03-21 ENCOUNTER — Telehealth: Payer: Self-pay | Admitting: Family Medicine

## 2019-03-21 NOTE — Telephone Encounter (Signed)
Spoke to [employer] from [place], she is requesting nebulizer for the patient, he has med's  that goes with the nebulizer but just needed the machine,  he has not been seen from 2019. Notify Levada Dy that he might need to schedule an appointment, she had confirm with the insurance that [employer] [facility] phone # [phone] and fax # [phone] is in the net work with patient's insurance. Her Direct number is [phone].

## 2019-03-21 NOTE — Telephone Encounter (Signed)
Agree. He will likely need an appointment, can do virtual apt for Nebulizer machine DME order and will place on his chart, and can send to [employer] via fax once office visit is done, or they can send [place] a DME order form for nebulizer.  Nobie Putnam, DO [facility] 03/21/2019, 2:51 PM

## 2019-03-21 NOTE — Telephone Encounter (Signed)
Spoke to [employer], once his office visit is done, they require office notes, demographic, insurance information and Rx stating patient name, D.O.B and Dx code. They give me different fax number [phone]. Then they will confirm with insurance company and let us know further requirement ?

## 2019-03-23 ENCOUNTER — Encounter: Payer: Self-pay | Admitting: Family Medicine

## 2019-03-23 ENCOUNTER — Other Ambulatory Visit: Payer: Self-pay

## 2019-03-23 ENCOUNTER — Ambulatory Visit (INDEPENDENT_AMBULATORY_CARE_PROVIDER_SITE_OTHER): Payer: PPO | Admitting: Family Medicine

## 2019-03-23 DIAGNOSIS — J432 Centrilobular emphysema: Secondary | ICD-10-CM

## 2019-03-23 NOTE — Progress Notes (Signed)
Virtual Visit via Telephone The purpose of this virtual visit is to provide medical care while limiting exposure to the novel coronavirus (COVID19) for both patient and office staff.  Consent was obtained for phone visit:  Yes.   Answered questions that patient had about telehealth interaction:  Yes.   I discussed the limitations, risks, security and privacy concerns of performing an evaluation and management service by telephone. I also discussed with the patient that there may be a patient responsible charge related to this service. The patient expressed understanding and agreed to proceed.  Patient Location: Home Provider Location: [facility])  ---------------------------------------------------------------------- Chief Complaint  Patient presents with  . Emphysema    requesting a nebulizer machine     S: Reviewed CMA documentation. I have called patient and gathered additional HPI as follows:  Centrilobular Emphysema Chronic long term problem with COPD emphysema, he has some persistent episodes of shortness of breath, no recent flare up. No significant coughing. He is seen by [facility] RN they are recommending that he get a nebulizer machine for his COPD. They are able to get him albuterol solution at home. He does not have nebulizer at this time. He uses Symbicort for daily maintenance and Albuterol HFA PRN.   Denies any high risk travel to areas of current concern for COVID19. Denies any known or suspected exposure to person with or possibly with COVID19.  Denies any fevers, chills, sweats, body ache, cough, sinus pain or pressure, headache, abdominal pain, diarrhea  Past Medical History:  Diagnosis Date  . Anemia   . BPH (benign prostatic hyperplasia)   . CAD (coronary artery disease)    a. s/p multiple prior PCIs b. s/p DES-distal RCA 05/2013  . COPD (chronic obstructive pulmonary disease) ([place])   . GERD (gastroesophageal reflux disease)   . Macular  degeneration   . Pure hypercholesterolemia   . Stroke ([place])   . Tobacco abuse   . Traumatic amputation of leg(s) (complete) (partial), unilateral, level not specified, without mention of complication 54/27/0623   Right Below Knee Amputation  . Unspecified essential hypertension    Social History   Tobacco Use  . Smoking status: Current Every Day Smoker    Packs/day: 1.00    Years: 50.00    Pack years: 50.00    Types: Cigarettes    Last attempt to quit: 03/29/2016    Years since quitting: 2.9  . Smokeless tobacco: Never Used  Substance Use Topics  . Alcohol use: No  . Drug use: No    Current Outpatient Medications:  .  amLODipine (NORVASC) 10 MG tablet, TAKE 1 TABLET BY MOUTH EVERY DAY, Disp: 90 tablet, Rfl: 3 .  aspirin EC 81 MG tablet, Take 1 tablet (81 mg total) by mouth daily., Disp: 90 tablet, Rfl: 3 .  atorvastatin (LIPITOR) 40 MG tablet, TAKE 1 TABLET BY MOUTH EVERY DAY, Disp: 90 tablet, Rfl: 0 .  budesonide-formoterol (SYMBICORT) 160-4.5 MCG/ACT inhaler, Inhale 2 puffs into the lungs 2 (two) times daily., Disp: 3 Inhaler, Rfl: 3 .  lisinopril (ZESTRIL) 30 MG tablet, TAKE 1 TABLET BY MOUTH EVERY DAY, Disp: 90 tablet, Rfl: 0 .  metFORMIN (GLUCOPHAGE) 500 MG tablet, TAKE 1 TABLET BY MOUTH TWICE A DAY WITH A MEAL, Disp: 180 tablet, Rfl: 1 .  metoprolol tartrate (LOPRESSOR) 50 MG tablet, TAKE 1 TABLET BY MOUTH TWICE A DAY, Disp: 60 tablet, Rfl: 10 .  nitroGLYCERIN (NITROSTAT) 0.4 MG SL tablet, Place 1 tablet (0.4 mg total) under  the tongue every 5 (five) minutes as needed for chest pain., Disp: 25 tablet, Rfl: 0 .  terazosin (HYTRIN) 1 MG capsule, Take 1 capsule (1 mg total) by mouth at bedtime., Disp: 30 capsule, Rfl: 10 .  albuterol (PROVENTIL HFA;VENTOLIN HFA) 108 (90 Base) MCG/ACT inhaler, Inhale 2 puffs into the lungs every 6 (six) hours as needed for wheezing or shortness of breath. (Patient taking differently: Inhale 2 puffs into the lungs every 6 (six) hours as needed for  wheezing or shortness of breath. ), Disp: 6 Inhaler, Rfl: 1 .  tiZANidine (ZANAFLEX) 2 MG tablet, Take 1 tablet (2 mg total) by mouth 2 (two) times daily as needed for muscle spasms (pain). (Patient not taking: Reported on 03/23/2019), Disp: 30 tablet, Rfl: 1  Depression screen [facility] 2/9 03/23/2019 10/16/2017 07/29/2017  Decreased Interest 0 0 0  Down, Depressed, Hopeless 0 0 0  PHQ - 2 Score 0 0 0  Altered sleeping - - -  Tired, decreased energy - - -  Change in appetite - - -  Feeling bad or failure about yourself  - - -  Trouble concentrating - - -  Moving slowly or fidgety/restless - - -  Suicidal thoughts - - -  PHQ-9 Score - - -  Difficult doing work/chores - - -    No flowsheet data found.  -------------------------------------------------------------------------- O: No physical exam performed due to remote telephone encounter.  Lab results reviewed.  Telephone virtual visit today. No physical exam performed.  No results found for this or any previous visit (from the past 2160 hour(s)).  -------------------------------------------------------------------------- A&P:  Problem List Items Addressed This Visit    Centrilobular emphysema ([place]) - Primary   Relevant Orders   For home use only DME Nebulizer machine    Chronic problem Currently stable, with some chronic dyspnea symptoms at baseline No acute flare up or exacerbation No additional history of infection Maintained on Symbicort Using Albuterol PRN  Order rx DME home nebulizer to use Albuterol solution PRN, already has medicine, will order through [facility] [place] and patient can continue to use [facility] services as needed.  Orders Placed This Encounter  Procedures  . For home use only DME Nebulizer machine    Patient with chronic COPD emphysema on maintenance therapy would medically benefit from using home nebulizer treatment as needed for dyspnea. Dx Centrilobular Emphysema (J43.2)    Order Specific Question:    Patient needs a nebulizer to treat with the following condition    Answer:   Centrilobular emphysema ([place]) [301601]    Order Specific Question:   Length of Need    Answer:   Lifetime    No orders of the defined types were placed in this encounter.  Follow-up: - Return as needed within 1-3 month for COPD  Patient verbalizes understanding with the above medical recommendations including the limitation of remote medical advice.  Specific follow-up and call-back criteria were given for patient to follow-up or seek medical care more urgently if needed.   - Time spent in direct consultation with patient on phone: 8 minutes   Nobie Putnam, [facility] 03/23/2019, 3:52 PM

## 2019-03-23 NOTE — Patient Instructions (Addendum)
Ordered Home Nebulizer machine to [facility]  Call us if you need any additional medication for it.  Please schedule a Follow-up Appointment to: Return in about 4 weeks (around 04/20/2019), or if symptoms worsen or fail to improve, for COPD.  If you have any other questions or concerns, please feel free to call the office or send a message through [place]. You may also schedule an earlier appointment if necessary.  Additionally, you may be receiving a survey about your experience at our office within a few days to 1 week by e-mail or mail. We value your feedback.  Nobie Putnam, DO [place]

## 2019-04-01 ENCOUNTER — Other Ambulatory Visit: Payer: Self-pay | Admitting: Family Medicine

## 2019-04-01 DIAGNOSIS — I10 Essential (primary) hypertension: Secondary | ICD-10-CM

## 2019-06-29 NOTE — Telephone Encounter (Signed)
Routing to close encounter.

## 2019-07-26 ENCOUNTER — Encounter: Payer: Self-pay | Admitting: Family Medicine

## 2019-07-26 ENCOUNTER — Other Ambulatory Visit: Payer: Self-pay

## 2019-07-26 ENCOUNTER — Ambulatory Visit (INDEPENDENT_AMBULATORY_CARE_PROVIDER_SITE_OTHER): Payer: PPO | Admitting: Family Medicine

## 2019-07-26 VITALS — BP 144/58 | HR 65 | Temp 97.3°F | Resp 16 | Ht 69.0 in | Wt 125.0 lb

## 2019-07-26 DIAGNOSIS — L989 Disorder of the skin and subcutaneous tissue, unspecified: Secondary | ICD-10-CM

## 2019-07-26 DIAGNOSIS — D485 Neoplasm of uncertain behavior of skin: Secondary | ICD-10-CM | POA: Diagnosis not present

## 2019-07-26 NOTE — Patient Instructions (Addendum)
Thank you for coming to the office today.  New referral sent to  [facility] PA: Dasher Rayvon Char MD Dermatologist in [place], [facility] Address: [address], [place], [place] [address] Phone: [phone]  Call them in 2 weeks if not heard back. If you need [place] to call as well we can help. May end up back at [facility] if we can get a sooner appointment.  Please schedule a Follow-up Appointment to: Return if symptoms worsen or fail to improve, for skin lesion.  If you have any other questions or concerns, please feel free to call the office or send a message through [place]. You may also schedule an earlier appointment if necessary.  Additionally, you may be receiving a survey about your experience at our office within a few days to 1 week by e-mail or mail. We value your feedback.  Nobie Putnam, DO [place]

## 2019-07-26 NOTE — Progress Notes (Signed)
Subjective:    Patient ID: Stanley Mclean, male    DOB: 11-30-1940, 79 y.o.   MRN: [id number]  Stanley Mclean is a 79 y.o. male presenting on 07/26/2019 for spot on forehead (has from past year)   HPI  Non Healing Skin Lesion, R Forehead Reports onset 1 year ago, developed a "black head" or skin spot on R forehead and left cheek area, he said he "popped them" and the forehead skin lesion never healed, the one on cheek did heal. He said it continues to come and go and R forehead and doesn't fully resolve. No other issues with it. Non tender. Not itching. No bleeding or drainage. He used to see Dr Nehemiah Massed at [facility] but cannot get in, he has tried in past and was told >4 month wait. - he has no person or family history of skin cancer or melanoma   Depression screen [facility] 2/9 03/23/2019 10/16/2017 07/29/2017  Decreased Interest 0 0 0  Down, Depressed, Hopeless 0 0 0  PHQ - 2 Score 0 0 0  Altered sleeping - - -  Tired, decreased energy - - -  Change in appetite - - -  Feeling bad or failure about yourself  - - -  Trouble concentrating - - -  Moving slowly or fidgety/restless - - -  Suicidal thoughts - - -  PHQ-9 Score - - -  Difficult doing work/chores - - -    Social History   Tobacco Use  . Smoking status: Current Every Day Smoker    Packs/day: 1.00    Years: 50.00    Pack years: 50.00    Types: Cigarettes    Last attempt to quit: 03/29/2016    Years since quitting: 3.3  . Smokeless tobacco: Never Used  Substance Use Topics  . Alcohol use: No  . Drug use: No    Review of Systems Per HPI unless specifically indicated above     Objective:    BP (!) 144/58   Pulse 65   Temp (!) 97.3 F (36.3 C) (Temporal)   Resp 16   Ht _0  (1.753 m)   Wt 125 lb (56.7 kg)   SpO2 92%   BMI 18.46 kg/m   Wt Readings from Last 3 Encounters:  07/26/19 125 lb (56.7 kg)  12/01/18 140 lb (63.5 kg)  10/16/17 135 lb (61.2 kg)    Physical Exam Vitals and nursing note  reviewed.  Constitutional:      General: He is not in acute distress.    Appearance: He is well-developed. He is not diaphoretic.     Comments: Well-appearing, comfortable, cooperative  HENT:     Head: Normocephalic and atraumatic.  Eyes:     General:        Right eye: No discharge.        Left eye: No discharge.     Conjunctiva/sclera: Conjunctivae normal.  Cardiovascular:     Rate and Rhythm: Normal rate.  Pulmonary:     Effort: Pulmonary effort is normal.  Skin:    General: Skin is warm and dry.     Findings: Lesion (R forehead lateral to eyebrow with 1 to 1.5 cm pearly slightly erythematous or discolored skin lesion with a scalloped appearance. ) present. No erythema or rash.  Neurological:     Mental Status: He is alert and oriented to person, place, and time.  Psychiatric:        Behavior: Behavior normal.  Comments: Well groomed, good eye contact, normal speech and thoughts    Results for orders placed or performed in visit on 01/03/19  Fecal occult blood, imunochemical  Result Value Ref Range   IFOBT Negative       Assessment & Plan:   Problem List Items Addressed This Visit    None    Visit Diagnoses    Non-healing skin lesion    -  Primary   Relevant Orders   Ambulatory referral to Dermatology   Neoplasm of uncertain behavior of skin of forehead       Relevant Orders   Ambulatory referral to Dermatology      Clinically concerning for abnormal non healing skin lesion R forehead Sunexposed area, age 82, concern for possible Basal Cell Carcinoma, or other possible pre-malignant or malignant lesion. No other history of skin cancer.  Will pursue referral back to Dermatology, switch to [facility] office now to try to get in promptly. He had problems getting seen by [facility] previously.  He was asked to check status of referral in 2 weeks if not heard, and we can help assist with getting him scheduled if needed.  Orders Placed This  Encounter  Procedures  . Ambulatory referral to Dermatology    Referral Priority:   Routine    Referral Type:   Consultation    Referral Reason:   Specialty Services Required    Requested Specialty:   Dermatology    Number of Visits Requested:   1    No orders of the defined types were placed in this encounter.     Follow up plan: Return if symptoms worsen or fail to improve, for skin lesion.   Nobie Putnam, [facility] 07/26/2019, 11:40 AM

## 2019-07-29 DIAGNOSIS — L728 Other follicular cysts of the skin and subcutaneous tissue: Secondary | ICD-10-CM | POA: Diagnosis not present

## 2019-07-29 DIAGNOSIS — L821 Other seborrheic keratosis: Secondary | ICD-10-CM | POA: Diagnosis not present

## 2019-07-29 DIAGNOSIS — L570 Actinic keratosis: Secondary | ICD-10-CM | POA: Diagnosis not present

## 2019-07-29 DIAGNOSIS — L729 Follicular cyst of the skin and subcutaneous tissue, unspecified: Secondary | ICD-10-CM | POA: Diagnosis not present

## 2019-07-29 DIAGNOSIS — C44319 Basal cell carcinoma of skin of other parts of face: Secondary | ICD-10-CM | POA: Diagnosis not present

## 2019-07-29 DIAGNOSIS — D485 Neoplasm of uncertain behavior of skin: Secondary | ICD-10-CM | POA: Diagnosis not present

## 2019-08-30 ENCOUNTER — Ambulatory Visit (INDEPENDENT_AMBULATORY_CARE_PROVIDER_SITE_OTHER): Payer: PPO

## 2019-08-30 DIAGNOSIS — Z0000 Encounter for general adult medical examination without abnormal findings: Secondary | ICD-10-CM | POA: Diagnosis not present

## 2019-08-30 NOTE — Patient Instructions (Signed)
Stanley Mclean , Thank you for taking time to come for your Medicare Wellness Visit. I appreciate your ongoing commitment to your health goals. Please review the following plan we discussed and let me know if I can assist you in the future.   Screening recommendations/referrals: Colonoscopy: no longer required  Recommended yearly ophthalmology/optometry visit for glaucoma screening and checkup Recommended yearly dental visit for hygiene and checkup  Vaccinations: Influenza vaccine: due 11/2019 Pneumococcal vaccine: booster due at next appt  Tdap vaccine: up to date  Shingles vaccine: shingrix eligible  Covid-19: completed, please bring card with correct dates to next appt  Advanced directives: mailed information   Conditions/risks identified: diabetic please schedule diabetic eye exam. Your provider would like to you have your annual eye exam. Please contact your current eye doctor or here are some good options for you to contact.   [facility]         Address: [address] [place], [place] [address]    Phone: [phone]       Website: [email or web address] Address: [address] [place], [place] [address]  Phone: [phone]  Website: [email or web address]  [facility]  Address: [place], [place], [place] [address] Phone: [phone]   [facility]  Address: [address], [place], [place] [address]  Phone: [phone]   [facility] Address: [address], [place], [place] [address]  Phone: [phone]   Next appointment: Follow up in one year for your annual wellness visit.  Preventive Care 22 Years and Older, Male Preventive care refers to lifestyle choices and visits with your health care provider that can promote health and wellness. What does preventive care include?  A yearly physical exam. This is also called an annual well check.  Dental exams once or twice a year.  Routine eye exams. Ask your  health care provider how often you should have your eyes checked.  Personal lifestyle choices, including:  Daily care of your teeth and gums.  Regular physical activity.  Eating a healthy diet.  Avoiding tobacco and drug use.  Limiting alcohol use.  Practicing safe sex.  Taking low doses of aspirin every day.  Taking vitamin and mineral supplements as recommended by your health care provider. What happens during an annual well check? The services and screenings done by your health care provider during your annual well check will depend on your age, overall health, lifestyle risk factors, and family history of disease. Counseling  Your health care provider may ask you questions about your:  Alcohol use.  Tobacco use.  Drug use.  Emotional well-being.  Home and relationship well-being.  Sexual activity.  Eating habits.  History of falls.  Memory and ability to understand (cognition).  Work and work Statistician. Screening  You may have the following tests or measurements:  Height, weight, and BMI.  Blood pressure.  Lipid and cholesterol levels. These may be checked every 5 years, or more frequently if you are over 65 years old.  Skin check.  Lung cancer screening. You may have this screening every year starting at age 81 if you have a 30-pack-year history of smoking and currently smoke or have quit within the past 15 years.  Fecal occult blood test (FOBT) of the stool. You may have this test every year starting at age 50.  Flexible sigmoidoscopy or colonoscopy. You may have a sigmoidoscopy every 5 years or a colonoscopy every  10 years starting at age 33.  Prostate cancer screening. Recommendations will vary depending on your family history and other risks.  Hepatitis C blood test.  Hepatitis B blood test.  Sexually transmitted disease (STD) testing.  Diabetes screening. This is done by checking your blood sugar (glucose) after you have not eaten for a  while (fasting). You may have this done every 1-3 years.  Abdominal aortic aneurysm (AAA) screening. You may need this if you are a current or former smoker.  Osteoporosis. You may be screened starting at age 31 if you are at high risk. Talk with your health care provider about your test results, treatment options, and if necessary, the need for more tests. Vaccines  Your health care provider may recommend certain vaccines, such as:  Influenza vaccine. This is recommended every year.  Tetanus, diphtheria, and acellular pertussis (Tdap, Td) vaccine. You may need a Td booster every 10 years.  Zoster vaccine. You may need this after age 35.  Pneumococcal 13-valent conjugate (PCV13) vaccine. One dose is recommended after age 86.  Pneumococcal polysaccharide (PPSV23) vaccine. One dose is recommended after age 40. Talk to your health care provider about which screenings and vaccines you need and how often you need them. This information is not intended to replace advice given to you by your health care provider. Make sure you discuss any questions you have with your health care provider. Document Released: 03/30/2015 Document Revised: 11/21/2015 Document Reviewed: 01/02/2015 Elsevier Interactive Patient Education  2017 [place] Prevention in the Home Falls can cause injuries. They can happen to people of all ages. There are many things you can do to make your home safe and to help prevent falls. What can I do on the outside of my home?  Regularly fix the edges of walkways and driveways and fix any cracks.  Remove anything that might make you trip as you walk through a door, such as a raised step or threshold.  Trim any bushes or trees on the path to your home.  Use bright outdoor lighting.  Clear any walking paths of anything that might make someone trip, such as rocks or tools.  Regularly check to see if handrails are loose or broken. Make sure that both sides of any steps  have handrails.  Any raised decks and porches should have guardrails on the edges.  Have any leaves, snow, or ice cleared regularly.  Use sand or salt on walking paths during winter.  Clean up any spills in your garage right away. This includes oil or grease spills. What can I do in the bathroom?  Use night lights.  Install grab bars by the toilet and in the tub and shower. Do not use towel bars as grab bars.  Use non-skid mats or decals in the tub or shower.  If you need to sit down in the shower, use a plastic, non-slip stool.  Keep the floor dry. Clean up any water that spills on the floor as soon as it happens.  Remove soap buildup in the tub or shower regularly.  Attach bath mats securely with double-sided non-slip rug tape.  Do not have throw rugs and other things on the floor that can make you trip. What can I do in the bedroom?  Use night lights.  Make sure that you have a light by your bed that is easy to reach.  Do not use any sheets or blankets that are too big for your bed. They should not  hang down onto the floor.  Have a firm chair that has side arms. You can use this for support while you get dressed.  Do not have throw rugs and other things on the floor that can make you trip. What can I do in the kitchen?  Clean up any spills right away.  Avoid walking on wet floors.  Keep items that you use a lot in easy-to-reach places.  If you need to reach something above you, use a strong step stool that has a grab bar.  Keep electrical cords out of the way.  Do not use floor polish or wax that makes floors slippery. If you must use wax, use non-skid floor wax.  Do not have throw rugs and other things on the floor that can make you trip. What can I do with my stairs?  Do not leave any items on the stairs.  Make sure that there are handrails on both sides of the stairs and use them. Fix handrails that are broken or loose. Make sure that handrails are as  long as the stairways.  Check any carpeting to make sure that it is firmly attached to the stairs. Fix any carpet that is loose or worn.  Avoid having throw rugs at the top or bottom of the stairs. If you do have throw rugs, attach them to the floor with carpet tape.  Make sure that you have a light switch at the top of the stairs and the bottom of the stairs. If you do not have them, ask someone to add them for you. What else can I do to help prevent falls?  Wear shoes that:  Do not have high heels.  Have rubber bottoms.  Are comfortable and fit you well.  Are closed at the toe. Do not wear sandals.  If you use a stepladder:  Make sure that it is fully opened. Do not climb a closed stepladder.  Make sure that both sides of the stepladder are locked into place.  Ask someone to hold it for you, if possible.  Clearly mark and make sure that you can see:  Any grab bars or handrails.  First and last steps.  Where the edge of each step is.  Use tools that help you move around (mobility aids) if they are needed. These include:  Canes.  Walkers.  Scooters.  Crutches.  Turn on the lights when you go into a dark area. Replace any light bulbs as soon as they burn out.  Set up your furniture so you have a clear path. Avoid moving your furniture around.  If any of your floors are uneven, fix them.  If there are any pets around you, be aware of where they are.  Review your medicines with your doctor. Some medicines can make you feel dizzy. This can increase your chance of falling. Ask your doctor what other things that you can do to help prevent falls. This information is not intended to replace advice given to you by your health care provider. Make sure you discuss any questions you have with your health care provider. Document Released: 12/28/2008 Document Revised: 08/09/2015 Document Reviewed: 04/07/2014 Elsevier Interactive Patient Education  2017 [employer].

## 2019-08-30 NOTE — Progress Notes (Signed)
Subjective:   Stanley Mclean is a 79 y.o. male who presents for Medicare Annual/Subsequent preventive examination.  I connected with Kidus Delman  today by telephone and verified that I am speaking with the correct person using two identifiers. Location patient: home Location provider: work Persons participating in the virtual visit: patient, Marine scientist.    I discussed the limitations, risks, security and privacy concerns of performing an evaluation and management service by telephone and the availability of in person appointments. I also discussed with the patient that there may be a patient responsible charge related to this service. The patient expressed understanding and verbally consented to this telephonic visit.    Interactive audio and video telecommunications were attempted between this provider and patient, however failed, due to patient having technical difficulties OR patient did not have access to video capability.  We continued and completed visit with audio only.  Some vital signs may be absent or patient reported.   Time Spent with patient on telephone encounter: 20 minutes  Review of Systems:   Cardiac Risk Factors include: advanced age (>48mn, >>50women);dyslipidemia;hypertension;male gender     Objective:    Vitals: There were no vitals taken for this visit.  There is no height or weight on file to calculate BMI.  Advanced Directives 07/15/2016 05/14/2016 05/05/2016 03/30/2016 03/30/2016 02/13/2015  Does Patient Have a Medical Advance Directive? _0  No  Would patient like information on creating a medical advance directive? No - Patient declined No - Patient declined - No - Patient declined - Yes - EScientist, clinical (histocompatibility and immunogenetics)given    Tobacco Social History   Tobacco Use  Smoking Status Current Every Day Smoker  . Packs/day: 1.00  . Years: 50.00  . Pack years: 50.00  . Types: Cigarettes  . Last attempt to quit: 03/29/2016  . Years since quitting: 3.4   Smokeless Tobacco Never Used     Ready to quit: No Counseling given: No   Clinical Intake:  Pre-visit preparation completed: Yes  Pain : No/denies pain     Nutritional Risks: None Diabetes: No  How often do you need to have someone help you when you read instructions, pamphlets, or other written materials from your doctor or pharmacy?: 1 - Never  Interpreter Needed?: No  Information entered by :: Hazem Kenner,LPN   Past Medical History:  Diagnosis Date  . Anemia   . Basal cell carcinoma   . BPH (benign prostatic hyperplasia)   . CAD (coronary artery disease)    a. s/p multiple prior PCIs b. s/p DES-distal RCA 05/2013  . COPD (chronic obstructive pulmonary disease) ([facility]   . GERD (gastroesophageal reflux disease)   . Macular degeneration   . Pure hypercholesterolemia   . Stroke ([facility]   . Tobacco abuse   . Traumatic amputation of leg(s) (complete) (partial), unilateral, level not specified, without mention of complication [id number]/[id number]  Right Below Knee Amputation  . Unspecified essential hypertension    Past Surgical History:  Procedure Laterality Date  . AMPUTATION Right 08/26/2011   Below Knee Amputee  . APPENDECTOMY    . CARDIAC CATHETERIZATION    . CAROTID ANGIOGRAPHY Right 05/14/2016   Procedure: Carotid Angiography;  Surgeon: GKatha Cabal MD;  [facility];  Service: Cardiovascular;  Laterality: Right;  . CAROTID PTA/STENT INTERVENTION  05/14/2016   Procedure: Carotid PTA/Stent Intervention;  Surgeon: GKatha Cabal MD;  [facility];  Service: Cardiovascular;;  . CORONARY ANGIOPLASTY WITH  STENT PLACEMENT  09/28/2006   RCA  . CORONARY ANGIOPLASTY WITH STENT PLACEMENT  07/21/2006   Mid LAD  . CORONARY ANGIOPLASTY WITH STENT PLACEMENT  06/06/2013   30% prox LAD at the site of a prior stent, 40% mid LAD at the origin of D1, 40% ostial D1, 30% prox LCx at the site of a prior stent, in the proximal third of the vessel  segment, 20% prox RCA, 99% distal RCA s/p DES. EF >55%.   Family History  Problem Relation Age of Onset  . Mental illness Mother    Social History   Socioeconomic History  . Marital status: Married    Spouse name: Not on file  . Number of children: Not on file  . Years of education: Not on file  . Highest education level: Not on file  Occupational History  . Occupation: retired   Tobacco Use  . Smoking status: Current Every Day Smoker    Packs/day: 1.00    Years: 50.00    Pack years: 50.00    Types: Cigarettes    Last attempt to quit: 03/29/2016    Years since quitting: 3.4  . Smokeless tobacco: Never Used  Vaping Use  . Vaping Use: Never used  Substance and Sexual Activity  . Alcohol use: No  . Drug use: No  . Sexual activity: Never  Other Topics Concern  . Not on file  Social History Narrative  . Not on file   Social Determinants of Health   Financial Resource Strain:   . Difficulty of Paying Living Expenses:   Food Insecurity:   . Worried About Charity fundraiser in the Last Year:   . Arboriculturist in the Last Year:   Transportation Needs:   . Film/video editor (Medical):   Marland Kitchen Lack of Transportation (Non-Medical):   Physical Activity:   . Days of Exercise per Week:   . Minutes of Exercise per Session:   Stress:   . Feeling of Stress :   Social Connections:   . Frequency of Communication with Friends and Family:   . Frequency of Social Gatherings with Friends and Family:   . Attends Religious Services:   . Active Member of Clubs or Organizations:   . Attends Archivist Meetings:   Marland Kitchen Marital Status:     Outpatient Encounter Medications as of 08/30/2019  Medication Sig  . albuterol (PROVENTIL HFA;VENTOLIN HFA) 108 (90 Base) MCG/ACT inhaler Inhale 2 puffs into the lungs every 6 (six) hours as needed for wheezing or shortness of breath. (Patient taking differently: Inhale 2 puffs into the lungs every 6 (six) hours as needed for wheezing or  shortness of breath. )  . amLODipine (NORVASC) 10 MG tablet TAKE 1 TABLET BY MOUTH EVERY DAY  . aspirin EC 81 MG tablet Take 1 tablet (81 mg total) by mouth daily.  Marland Kitchen atorvastatin (LIPITOR) 40 MG tablet TAKE 1 TABLET BY MOUTH EVERY DAY  . budesonide-formoterol (SYMBICORT) 160-4.5 MCG/ACT inhaler Inhale 2 puffs into the lungs 2 (two) times daily.  . clotrimazole (LOTRIMIN) 1 % cream APPLY ON THE SKIN TWICE A DAY  . lisinopril (ZESTRIL) 30 MG tablet TAKE 1 TABLET BY MOUTH EVERY DAY  . metFORMIN (GLUCOPHAGE) 500 MG tablet TAKE 1 TABLET BY MOUTH TWICE A DAY WITH A MEAL  . metoprolol tartrate (LOPRESSOR) 50 MG tablet TAKE 1 TABLET BY MOUTH TWICE A DAY  . nitroGLYCERIN (NITROSTAT) 0.4 MG SL tablet Place 1 tablet (0.4 mg total)  under the tongue every 5 (five) minutes as needed for chest pain.  Marland Kitchen nystatin cream (MYCOSTATIN) APPLY TO AFFECTED AREA TWICE A DAY  . terazosin (HYTRIN) 1 MG capsule Take 1 capsule (1 mg total) by mouth at bedtime.  Marland Kitchen tiZANidine (ZANAFLEX) 2 MG tablet Take 1 tablet (2 mg total) by mouth 2 (two) times daily as needed for muscle spasms (pain).  . triamcinolone cream (KENALOG) 0.1 % APPLY TO AFFECTED AREA TWICE A DAY   No facility-administered encounter medications on file as of 08/30/2019.    Activities of Daily Living In your present state of health, do you have any difficulty performing the following activities: 08/30/2019 03/23/2019  Hearing? Y N  Comment no hearing aids -  Vision? N N  Comment eyeglasses, no eye dr -  Difficulty concentrating or making decisions? N N  Walking or climbing stairs? N N  Dressing or bathing? N N  Doing errands, shopping? N N  Preparing Food and eating ? N -  Using the Toilet? N -  In the past six months, have you accidently leaked urine? N -  Do you have problems with loss of bowel control? N -  Managing your Medications? N -  Managing your Finances? N -  Housekeeping or managing your Housekeeping? N -  Some recent data might be hidden     Patient Care Team: Olin Hauser, DO as PCP - General (Family Medicine) Rockey Situ Kathlene November, MD as PCP - Cardiology (Cardiology) Minna Merritts, MD as Consulting Physician (Cardiology) Ralene Bathe, MD as Consulting Physician (Dermatology) Schnier, Dolores Lory, MD as Consulting Physician (Vascular Surgery) Mardene Celeste., MD as Consulting Physician (Dentistry)   Assessment:   This is a routine wellness examination for Vera.  Exercise Activities and Dietary recommendations Current Exercise Habits: The patient does not participate in regular exercise at present, Exercise limited by: None identified  Goals Addressed   None     Fall Risk: Fall Risk  08/30/2019 02/04/2019 10/16/2017 07/29/2017 07/15/2016  Falls in the past year? 0 1 No No No  Comment - Emmi Telephone Survey: data to providers prior to load - - -  Number falls in past yr: 0 1 - - -  Comment - Emmi Telephone Survey Actual Response = 1 - - -  Injury with Fall? 0 1 - - -    FALL RISK PREVENTION PERTAINING TO THE HOME:  Any stairs in or around the home? No  If so, are there any without handrails? No   Home free of loose throw rugs in walkways, pet beds, electrical cords, etc? Yes  Adequate lighting in your home to reduce risk of falls? Yes   ASSISTIVE DEVICES UTILIZED TO PREVENT FALLS:  Life alert? No  Use of a cane, walker or w/c? Yes  Grab bars in the bathroom? Yes  Shower chair or bench in shower? Yes  Elevated toilet seat or a handicapped toilet? No   TIMED UP AND GO:  Unable to perform   Depression Screen PHQ 2/9 Scores 08/30/2019 03/23/2019 10/16/2017 07/29/2017  PHQ - 2 Score 0 0 0 0  PHQ- 9 Score - - - -    Cognitive Function MMSE - Mini Mental State Exam 07/15/2016  Orientation to time 5  Orientation to Place 5  Registration 3  Attention/ Calculation 0  Recall 2  Language- name 2 objects 2  Language- repeat 1  Language- follow 3 step command 3  Language- read & follow  direction 1  Write a sentence 1  Copy design 0  Total score 23     6CIT Screen 07/15/2016  What Year? 0 points  What month? 0 points  What time? 0 points  Count back from 20 0 points  Months in reverse 0 points  Repeat phrase 0 points  Total Score 0    Immunization History  Administered Date(s) Administered  . Influenza, High Dose Seasonal PF 11/25/2016, 12/15/2017, 11/16/2018  . Influenza-Unspecified 11/25/2016, 11/15/2017  . PFIZER SARS-COV-2 Vaccination 04/18/2019, 05/08/2019    Qualifies for Shingles Vaccine? Yes  Zostavax completed n/a. Due for Shingrix. Education has been provided regarding the importance of this vaccine. Pt has been advised to call insurance company to determine out of pocket expense. Advised may also receive vaccine at local pharmacy or Health Dept. Verbalized acceptance and understanding.  Tdap: up to date   Flu Vaccine: up to date   Pneumococcal Vaccine: due for booster   Covid-19 Vaccine: Completed vaccines  Screening Tests Health Maintenance  Topic Date Due  . Hepatitis C Screening  Never done  . FOOT EXAM  Never done  . PNA vac Low Risk Adult (2 of 2 - PPSV23) 12/07/2014  . OPHTHALMOLOGY EXAM  10/07/2017  . HEMOGLOBIN A1C  01/06/2018  . INFLUENZA VACCINE  10/16/2019  . TETANUS/TDAP  01/04/2025  . COVID-19 Vaccine  Completed   Cancer Screenings:  Colorectal Screening: no longer required   Lung Cancer Screening: (Low Dose CT Chest recommended if Age 71-80 years, 30 pack-year currently smoking OR have quit w/in 15years.) does qualify.   Declined   Additional Screening:  Hepatitis C Screening: does qualify; declined   Vision Screening: Recommended annual ophthalmology exams for early detection of glaucoma and other disorders of the eye. Is the patient up to date with their annual eye exam?  no   Dental Screening: Recommended annual dental exams for proper oral hygiene  Community Resource Referral:  CRR required this visit?  No         Plan:  I have personally reviewed and addressed the Medicare Annual Wellness questionnaire and have noted the following in the patient's chart:  A. Medical and social history B. Use of alcohol, tobacco or illicit drugs  C. Current medications and supplements D. Functional ability and status E.  Nutritional status F.  Physical activity G. Advance directives H. List of other physicians I.  Hospitalizations, surgeries, and ER visits in previous 12 months J.  [place] such as hearing and vision if needed, cognitive and depression L. Referrals and appointments   In addition, I have reviewed and discussed with patient certain preventive protocols, quality metrics, and best practice recommendations. A written personalized care plan for preventive services as well as general preventive health recommendations were provided to patient.   Signed,   Bevelyn Ngo, LPN  [id number] Nurse Health Advisor   Nurse Notes: none

## 2019-09-02 DIAGNOSIS — C44319 Basal cell carcinoma of skin of other parts of face: Secondary | ICD-10-CM | POA: Diagnosis not present

## 2019-09-15 DIAGNOSIS — X32XXXA Exposure to sunlight, initial encounter: Secondary | ICD-10-CM | POA: Diagnosis not present

## 2019-09-15 DIAGNOSIS — L570 Actinic keratosis: Secondary | ICD-10-CM | POA: Diagnosis not present

## 2019-09-18 ENCOUNTER — Other Ambulatory Visit: Payer: Self-pay | Admitting: Cardiovascular Disease

## 2019-10-24 DIAGNOSIS — M545 Low back pain: Secondary | ICD-10-CM | POA: Diagnosis not present

## 2019-10-24 DIAGNOSIS — G8929 Other chronic pain: Secondary | ICD-10-CM | POA: Diagnosis not present

## 2019-10-24 DIAGNOSIS — M47818 Spondylosis without myelopathy or radiculopathy, sacral and sacrococcygeal region: Secondary | ICD-10-CM | POA: Diagnosis not present

## 2019-10-24 DIAGNOSIS — M5441 Lumbago with sciatica, right side: Secondary | ICD-10-CM | POA: Diagnosis not present

## 2019-11-03 ENCOUNTER — Other Ambulatory Visit: Payer: Self-pay | Admitting: Family Medicine

## 2019-11-03 DIAGNOSIS — M5441 Lumbago with sciatica, right side: Secondary | ICD-10-CM | POA: Diagnosis not present

## 2019-11-03 DIAGNOSIS — G8929 Other chronic pain: Secondary | ICD-10-CM | POA: Diagnosis not present

## 2019-11-03 DIAGNOSIS — M5136 Other intervertebral disc degeneration, lumbar region: Secondary | ICD-10-CM | POA: Diagnosis not present

## 2019-11-03 DIAGNOSIS — M5416 Radiculopathy, lumbar region: Secondary | ICD-10-CM | POA: Diagnosis not present

## 2019-11-23 ENCOUNTER — Ambulatory Visit
Admission: RE | Admit: 2019-11-23 | Discharge: 2019-11-23 | Disposition: A | Discharge status: Home / Self Care | Payer: PPO | Source: Ambulatory Visit | Attending: Family Medicine | Admitting: Family Medicine

## 2019-11-23 ENCOUNTER — Other Ambulatory Visit: Payer: Self-pay

## 2019-11-23 DIAGNOSIS — M545 Low back pain: Secondary | ICD-10-CM | POA: Diagnosis not present

## 2019-11-23 DIAGNOSIS — M5441 Lumbago with sciatica, right side: Secondary | ICD-10-CM | POA: Diagnosis not present

## 2019-11-23 DIAGNOSIS — G8929 Other chronic pain: Secondary | ICD-10-CM | POA: Diagnosis not present

## 2019-11-24 ENCOUNTER — Telehealth: Payer: Self-pay

## 2019-11-24 ENCOUNTER — Encounter: Payer: Self-pay | Admitting: Family Medicine

## 2019-11-24 DIAGNOSIS — M5416 Radiculopathy, lumbar region: Secondary | ICD-10-CM | POA: Diagnosis not present

## 2019-11-24 DIAGNOSIS — M48062 Spinal stenosis, lumbar region with neurogenic claudication: Secondary | ICD-10-CM | POA: Diagnosis not present

## 2019-11-24 DIAGNOSIS — N281 Cyst of kidney, acquired: Secondary | ICD-10-CM | POA: Insufficient documentation

## 2019-11-24 DIAGNOSIS — M4726 Other spondylosis with radiculopathy, lumbar region: Secondary | ICD-10-CM | POA: Diagnosis not present

## 2019-11-24 DIAGNOSIS — M5136 Other intervertebral disc degeneration, lumbar region: Secondary | ICD-10-CM | POA: Diagnosis not present

## 2019-11-24 NOTE — Telephone Encounter (Signed)
Copied from [place] [id number]. Topic: General - Other >> Nov 23, 2019  4:28 PM Oneta Rack wrote: Caller name: Dr. Sharlet Salina  Relation to pt: [place]  Call back number: [phone]    Reason for call:  Faxing MRI of the lumbar spine report to [phone] which will indicate renal ultrasound is recommended and would like PCP to consider. Please note when received and check care everywhere for dx.

## 2019-11-24 NOTE — Telephone Encounter (Signed)
Called patient to inform about renal ultrasound, he wants to talk to [facility] first and will call back here to get that done.

## 2019-11-24 NOTE — Telephone Encounter (Signed)
Reviewed chart, MRI from 11/23/19 Lumbar spine copied result below regarding newly identified renal cysts.  11/23/19 Lumbar MRI There are two right-sided renal lesions which demonstrate T2 low signal. These may reflect hemorrhagic cysts but are incompletely assessed on the current examination. Renal ultrasound is recommended for further evaluation and to exclude a renal mass.  I did review rest of his chart he had Ultrasound in 05/2013, showed a Right Renal Cyst.  Right Kidney:   Length: 10.7 cm.   Echogenicity within normal limits. A benign 1 x 1.6 x 1.5 cm cyst is  appreciated within the upper pole otherwise no further evidence of  mass or hydronephrosis visualized.   --------------  Please notify patient that his specialist at [facility] who did the Lumbar MRI - called [place] with the results and they would like [place] to discuss the "RIght Kidney Cysts" seen on MRI image.  They recommend a Renal Ultrasound.  Let me know if he agrees to have Renal Ultrasound.  I can order the test at [facility]. He should schedule an in person or virtual visit after that [place] has resulted. Within next 2-4 weeks or whenever is convenient.  Thanks  Nobie Putnam, [facility] 11/24/2019, 2:34 PM

## 2019-11-29 DIAGNOSIS — M5136 Other intervertebral disc degeneration, lumbar region: Secondary | ICD-10-CM | POA: Diagnosis not present

## 2019-11-29 DIAGNOSIS — M48062 Spinal stenosis, lumbar region with neurogenic claudication: Secondary | ICD-10-CM | POA: Diagnosis not present

## 2019-11-29 DIAGNOSIS — M5416 Radiculopathy, lumbar region: Secondary | ICD-10-CM | POA: Diagnosis not present

## 2019-12-12 ENCOUNTER — Other Ambulatory Visit: Payer: Self-pay | Admitting: Cardiovascular Disease

## 2019-12-12 NOTE — Telephone Encounter (Signed)
Please schedule 12 month F/U appointment with Dr. Rockey Situ. Thank you!

## 2019-12-12 NOTE — Telephone Encounter (Signed)
Attempted to schedule.  Patient says this is not a good time.

## 2019-12-13 ENCOUNTER — Telehealth: Payer: Self-pay | Admitting: Cardiovascular Disease

## 2019-12-13 NOTE — Progress Notes (Signed)
Virtual Visit via Telephone Note   This visit type was conducted due to national recommendations for restrictions regarding the COVID-19 Pandemic (e.g. social distancing) in an effort to limit this patient's exposure and mitigate transmission in our community.  Due to his co-morbid illnesses, this patient is at least at moderate risk for complications without adequate follow up.  This format is felt to be most appropriate for this patient at this time.  The patient did not have access to video technology/had technical difficulties with video requiring transitioning to audio format only (telephone).  All issues noted in this document were discussed and addressed.  No physical exam could be performed with this format.  Please refer to the patient's chart for his  consent to telehealth for [facility].    Date:  12/14/2019   ID:  Stanley Mclean, DOB Jun 17, 1940, MRN [id number] The patient was identified using 2 identifiers.  Patient Location: Home Provider Location: Office/Clinic  PCP:  Olin Hauser, DO  Cardiologist:  Ida Rogue, MD  Electrophysiologist:  None   Evaluation Performed:  Follow-Up Visit  Chief Complaint: Follow-up  History of Present Illness:    Stanley Mclean is a 79 y.o. male with CAD status post multiple PCI's as outlined below, pulmonary hypertension, CVA, DM2, HTN, HLD, carotid artery stenosis, COPD, ongoing tobacco use, lumbar radiculitis, and prior amputation of the right lower extremity from a lawnmower accident greater than 2 years ago who presents for follow-up of his CAD.  Patient has a long history of CAD with prior remote stenting to his mid LAD, 2 stents to the proximal LCx in 07/2006, and stenting to the proximal RCA in 09/2006.  Most recent cath from 05/2013 demonstrated 40% mid LAD stenosis with mild ISR, 40% D1 stenosis, 30% proximal LCx stenosis with mild ISR, and severe distal RCA stenosis.  He underwent successful PCI/DES to the distal  RCA.  In the setting of his right hemispheric CVA in 2018 echo at that time showed an EF of 55 to 60%, no regional wall motion abnormalities, grade 1 diastolic dysfunction, trivial aortic insufficiency, mild mitral regurgitation, normal RV systolic function, and PASP 59 mmHg.  No cardiac source of emboli was identified.  Carotid artery ultrasound showed a large amount of eccentric plaque within the right carotid bulb resulting in at least 50% luminal narrowing with a moderate amount of left internal carotid artery stenosis.  There was bilateral antegrade vertebral flow.  He underwent carotid artery angiography in 05/2016 which showed 50% RICA stenosis.  He was most recently seen virtually by his primary cardiologist in 11/2018 and was doing reasonably well from a cardiac perspective without complaints.  He had chronic stable shortness of breath.  He continued to smoke 1 pack/day.  He was uninterested in quitting smoking at that time.  He is seen virtually today by phone visit as it is difficult for him to get out and about secondary to chronic pain issues.  He is doing well from a cardiac perspective.  He denies any chest pain, dyspnea, palpitations, dizziness, presyncope, or syncope.  No lower extremity swelling, abdominal distention, orthopnea, PND, early satiety.  Blood pressure is mildly elevated today from what it usually runs.  No falls, hematochezia, or melena.  He continues to smoke and is not interested in quitting.  He has not needed any sublingual nitro.  He does not have any issues or concerns.   Labs: 06/2017 - A1c 8.2, TC 123, TG 131, HDL 61, LDL 61,  potassium 3.8, BUN 14, serum creatinine 1.0, Hgb 15.7, PLT 173 06/2016 - TSH normal  The patient does not have symptoms concerning for COVID-19 infection (fever, chills, cough, or new shortness of breath).    Past Medical History:  Diagnosis Date  . Anemia   . Basal cell carcinoma   . BPH (benign prostatic hyperplasia)   . CAD (coronary  artery disease)    a. s/p multiple prior PCIs b. s/p DES-distal RCA 05/2013  . COPD (chronic obstructive pulmonary disease) ([place])   . GERD (gastroesophageal reflux disease)   . Macular degeneration   . Pure hypercholesterolemia   . Stroke ([place])   . Tobacco abuse   . Traumatic amputation of leg(s) (complete) (partial), unilateral, level not specified, without mention of complication 96/29/5284   Right Below Knee Amputation  . Unspecified essential hypertension    Past Surgical History:  Procedure Laterality Date  . AMPUTATION Right 08/26/2011   Below Knee Amputee  . APPENDECTOMY    . CARDIAC CATHETERIZATION    . CAROTID ANGIOGRAPHY Right 05/14/2016   Procedure: Carotid Angiography;  Surgeon: Katha Cabal, MD;  Location: [facility];  Service: Cardiovascular;  Laterality: Right;  . CAROTID PTA/STENT INTERVENTION  05/14/2016   Procedure: Carotid PTA/Stent Intervention;  Surgeon: Katha Cabal, MD;  Location: [facility];  Service: Cardiovascular;;  . CORONARY ANGIOPLASTY WITH STENT PLACEMENT  09/28/2006   RCA  . CORONARY ANGIOPLASTY WITH STENT PLACEMENT  07/21/2006   Mid LAD  . CORONARY ANGIOPLASTY WITH STENT PLACEMENT  06/06/2013   30% prox LAD at the site of a prior stent, 40% mid LAD at the origin of D1, 40% ostial D1, 30% prox LCx at the site of a prior stent, in the proximal third of the vessel segment, 20% prox RCA, 99% distal RCA s/p DES. EF >55%.     Current Meds  Medication Sig  . albuterol (PROVENTIL HFA;VENTOLIN HFA) 108 (90 Base) MCG/ACT inhaler Inhale 2 puffs into the lungs every 6 (six) hours as needed for wheezing or shortness of breath. (Patient taking differently: Inhale 2 puffs into the lungs every 6 (six) hours as needed for wheezing or shortness of breath. )  . amLODipine (NORVASC) 10 MG tablet TAKE 1 TABLET BY MOUTH EVERY DAY  . aspirin EC 81 MG tablet Take 1 tablet (81 mg total) by mouth daily.  Marland Kitchen atorvastatin (LIPITOR) 40 MG tablet TAKE 1  TABLET BY MOUTH EVERY DAY  . budesonide-formoterol (SYMBICORT) 160-4.5 MCG/ACT inhaler Inhale 2 puffs into the lungs 2 (two) times daily.  . clotrimazole (LOTRIMIN) 1 % cream APPLY ON THE SKIN TWICE A DAY  . lisinopril (ZESTRIL) 30 MG tablet TAKE 1 TABLET BY MOUTH EVERY DAY  . metFORMIN (GLUCOPHAGE) 500 MG tablet TAKE 1 TABLET BY MOUTH TWICE A DAY WITH A MEAL  . metoprolol tartrate (LOPRESSOR) 50 MG tablet TAKE 1 TABLET BY MOUTH TWICE A DAY  . nitroGLYCERIN (NITROSTAT) 0.4 MG SL tablet Place 1 tablet (0.4 mg total) under the tongue every 5 (five) minutes as needed for chest pain.  Marland Kitchen nystatin cream (MYCOSTATIN) APPLY TO AFFECTED AREA TWICE A DAY  . terazosin (HYTRIN) 1 MG capsule TAKE 1 CAPSULE (1 MG TOTAL) BY MOUTH AT BEDTIME.  Marland Kitchen triamcinolone cream (KENALOG) 0.1 % APPLY TO AFFECTED AREA TWICE A DAY     Allergies:   Patient has no known allergies.   Social History   Tobacco Use  . Smoking status: Current Every Day Smoker  Packs/day: 1.00    Years: 50.00    Pack years: 50.00    Types: Cigarettes    Last attempt to quit: 03/29/2016    Years since quitting: 3.7  . Smokeless tobacco: Never Used  Vaping Use  . Vaping Use: Never used  Substance Use Topics  . Alcohol use: No  . Drug use: No     Family Hx: The patient's family history includes Mental illness in his mother.  ROS:   Please see the history of present illness.     All other systems reviewed and are negative.   Prior CV studies:   The following studies were reviewed today:  2D echo 03/2016: - Left ventricle: The cavity size was normal. There was mild  concentric hypertrophy. Systolic function was normal. The  estimated ejection fraction was in the range of 55% to 60%. Wall  motion was normal; there were no regional wall motion  abnormalities. Doppler parameters are consistent with abnormal  left ventricular relaxation (grade 1 diastolic dysfunction).  - Aortic valve: There was trivial regurgitation.   - Mitral valve: There was mild regurgitation.  - Right ventricle: Systolic function was normal.  - Pulmonary arteries: Systolic pressure was moderate to severely  elevated. PA peak pressure: 59 mm Hg (S).   Impressions:   - No cardiac source of emboli was indentified.  Labs/Other Tests and Data Reviewed:    EKG:  No ECG reviewed.  Recent Labs: No results found for requested labs within last 8760 hours.   Recent Lipid Panel Lab Results  Component Value Date/Time   CHOL 123 07/07/2017 12:00 AM   CHOL 166 12/29/2013 10:33 AM   CHOL 175 06/01/2013 11:33 AM   TRIG 131 07/07/2017 12:00 AM   TRIG 211 (H) 06/01/2013 11:33 AM   HDL 61 07/07/2017 12:00 AM   HDL 43 12/29/2013 10:33 AM   HDL 39 (L) 06/01/2013 11:33 AM   CHOLHDL 3.3 03/30/2016 05:59 AM   LDLCALC 61 07/07/2017 12:00 AM   LDLCALC 69 12/29/2013 10:33 AM   LDLCALC 94 06/01/2013 11:33 AM    Wt Readings from Last 3 Encounters:  12/14/19 140 lb (63.5 kg)  07/26/19 125 lb (56.7 kg)  12/01/18 140 lb (63.5 kg)     Objective:    Vital Signs:  BP (!) 157/82   Pulse 76   Ht _0  (1.753 m)   Wt 140 lb (63.5 kg)   BMI 20.67 kg/m    VITAL SIGNS:  reviewed  ASSESSMENT & PLAN:    1. CAD involving the native coronary arteries without angina: He is doing well without any symptoms concerning for angina.  Continue secondary prevention with aspirin, amlodipine, atorvastatin, lisinopril, metoprolol, and as needed SL NTG.  Recommend risk factor modification including complete smoking cessation.  He is at high risk of progression of his coronary disease with ongoing tobacco use.  2. Pulmonary hypertension: Likely in the setting of underlying COPD with ongoing tobacco use.  He denies any symptoms concerning for decompensation.  3. HTN: Blood pressure is mildly elevated today though typically better controlled at home.  He remains on amlodipine, lisinopril, and metoprolol.  Low-sodium diet is recommended.  4. HLD: LDL 61 from  06/2017.  No recent labs available for review.  We did place future orders for CMP, lipid panel, and CBC to be drawn at his convenience.  He remains on atorvastatin.  5. COPD with ongoing tobacco use: Follow-up with PCP as directed.  Complete cessation of tobacco use  is recommended.  He is not interested in quitting at this time.  He indicates if he would like to quit he will let us know.  6. Carotid artery stenosis: 50% RICA stenosis by carotid angiography in 2018.  When he is seen in person recommend updating noninvasive carotid imaging.  He remains on aspirin and atorvastatin.  7. History of CVA: No new symptoms.  He remains on aspirin and atorvastatin as outlined above.   COVID-19 Education: The signs and symptoms of COVID-19 were discussed with the patient and how to seek care for testing (follow up with PCP or arrange E-visit).  The importance of social distancing was discussed today.  Time:   Today, I have spent 10 minutes with the patient with telehealth technology discussing the above problems.     Medication Adjustments/Labs and Tests Ordered: Current medicines are reviewed at length with the patient today.  Concerns regarding medicines are outlined above.   Tests Ordered: No orders of the defined types were placed in this encounter.   Medication Changes: No orders of the defined types were placed in this encounter.   Follow Up:  In Person in 1 year(s)  Signed, Christell Faith, PA-C  12/14/2019 2:44 PM    [place]

## 2019-12-13 NOTE — Telephone Encounter (Signed)
  Patient Consent for Virtual Visit         [name] has provided verbal consent on 12/13/2019 for a virtual visit (video or telephone).   CONSENT FOR VIRTUAL VISIT FOR:  Stanley Mclean  By participating in this virtual visit I agree to the following:  I hereby voluntarily request, consent and authorize [facility] and its employed or contracted physicians, physician assistants, nurse practitioners or other licensed health care professionals (the Practitioner), to provide me with telemedicine health care services (the "Services") as deemed necessary by the treating Practitioner. I acknowledge and consent to receive the Services by the Practitioner via telemedicine. I understand that the telemedicine visit will involve communicating with the Practitioner through live audiovisual communication technology and the disclosure of certain medical information by electronic transmission. I acknowledge that I have been given the opportunity to request an in-person assessment or other available alternative prior to the telemedicine visit and am voluntarily participating in the telemedicine visit.  I understand that I have the right to withhold or withdraw my consent to the use of telemedicine in the course of my care at any time, without affecting my right to future care or treatment, and that the Practitioner or I may terminate the telemedicine visit at any time. I understand that I have the right to inspect all information obtained and/or recorded in the course of the telemedicine visit and may receive copies of available information for a reasonable fee.  I understand that some of the potential risks of receiving the Services via telemedicine include:  Marland Kitchen Delay or interruption in medical evaluation due to technological equipment failure or disruption; . Information transmitted may not be sufficient (e.g. poor resolution of images) to allow for appropriate medical decision making by the Practitioner;  and/or  . In rare instances, security protocols could fail, causing a breach of personal health information.  Furthermore, I acknowledge that it is my responsibility to provide information about my medical history, conditions and care that is complete and accurate to the best of my ability. I acknowledge that Practitioner's advice, recommendations, and/or decision may be based on factors not within their control, such as incomplete or inaccurate data provided by me or distortions of diagnostic images or specimens that may result from electronic transmissions. I understand that the practice of medicine is not an exact science and that Practitioner makes no warranties or guarantees regarding treatment outcomes. I acknowledge that a copy of this consent can be made available to me via my patient portal ([facility]), or I can request a printed copy by calling the office of [facility].    I understand that my insurance will be billed for this visit.   I have read or had this consent read to me. . I understand the contents of this consent, which adequately explains the benefits and risks of the Services being provided via telemedicine.  . I have been provided ample opportunity to ask questions regarding this consent and the Services and have had my questions answered to my satisfaction. . I give my informed consent for the services to be provided through the use of telemedicine in my medical care

## 2019-12-14 ENCOUNTER — Encounter: Payer: Self-pay | Admitting: Physician Assistant

## 2019-12-14 ENCOUNTER — Other Ambulatory Visit: Payer: Self-pay

## 2019-12-14 ENCOUNTER — Telehealth (INDEPENDENT_AMBULATORY_CARE_PROVIDER_SITE_OTHER): Payer: PPO | Admitting: Physician Assistant

## 2019-12-14 VITALS — BP 157/82 | HR 76 | Ht 69.0 in | Wt 140.0 lb

## 2019-12-14 DIAGNOSIS — I10 Essential (primary) hypertension: Secondary | ICD-10-CM

## 2019-12-14 DIAGNOSIS — I6521 Occlusion and stenosis of right carotid artery: Secondary | ICD-10-CM

## 2019-12-14 DIAGNOSIS — I272 Pulmonary hypertension, unspecified: Secondary | ICD-10-CM

## 2019-12-14 DIAGNOSIS — Z8673 Personal history of transient ischemic attack (TIA), and cerebral infarction without residual deficits: Secondary | ICD-10-CM

## 2019-12-14 DIAGNOSIS — F17200 Nicotine dependence, unspecified, uncomplicated: Secondary | ICD-10-CM | POA: Diagnosis not present

## 2019-12-14 DIAGNOSIS — E785 Hyperlipidemia, unspecified: Secondary | ICD-10-CM | POA: Diagnosis not present

## 2019-12-14 DIAGNOSIS — I2510 Atherosclerotic heart disease of native coronary artery without angina pectoris: Secondary | ICD-10-CM | POA: Diagnosis not present

## 2019-12-14 DIAGNOSIS — J432 Centrilobular emphysema: Secondary | ICD-10-CM

## 2019-12-14 NOTE — Patient Instructions (Signed)
Medication Instructions:  Your physician recommends that you continue on your current medications as directed. Please refer to the Current Medication list given to you today.  *If you need a refill on your cardiac medications before your next appointment, please call your pharmacy*   Lab Work:  Your physician recommends that you return for lab work in: At your earliest convenience. For a CMP, Lipid, CBC.  - Please go to the [facility]. You will check in at the front desk to the right as you walk into the atrium. Valet Parking is offered if needed. - No appointment needed, your lab orders are already there. You may go any day between 7 am and 6 pm.    Testing/Procedures: None Ordered   Follow-Up: At [facility], you and your health needs are our priority.  As part of our continuing mission to provide you with exceptional heart care, we have created designated Provider Care Teams.  These Care Teams include your primary Cardiologist (physician) and Advanced Practice Providers (APPs -  Physician Assistants and Nurse Practitioners) who all work together to provide you with the care you need, when you need it.  We recommend signing up for the patient portal called "MyChart".  Sign up information is provided on this After Visit Summary.  MyChart is used to connect with patients for Virtual Visits (Telemedicine).  Patients are able to view lab/test results, encounter notes, upcoming appointments, etc.  Non-urgent messages can be sent to your provider as well.   To learn more about what you can do with MyChart, go to [email or web address].    Your next appointment:   12 month(s)  The format for your next appointment:   In Person  Provider:   You may see Ida Rogue, MD or one of the following Advanced Practice Providers on your designated Care Team:     Christell Faith, [place]    Other Instructions

## 2019-12-22 DIAGNOSIS — M5136 Other intervertebral disc degeneration, lumbar region: Secondary | ICD-10-CM | POA: Diagnosis not present

## 2019-12-22 DIAGNOSIS — M5416 Radiculopathy, lumbar region: Secondary | ICD-10-CM | POA: Diagnosis not present

## 2019-12-22 DIAGNOSIS — M48062 Spinal stenosis, lumbar region with neurogenic claudication: Secondary | ICD-10-CM | POA: Diagnosis not present

## 2020-01-12 DIAGNOSIS — M5416 Radiculopathy, lumbar region: Secondary | ICD-10-CM | POA: Diagnosis not present

## 2020-01-12 DIAGNOSIS — M5136 Other intervertebral disc degeneration, lumbar region: Secondary | ICD-10-CM | POA: Diagnosis not present

## 2020-01-12 DIAGNOSIS — M48062 Spinal stenosis, lumbar region with neurogenic claudication: Secondary | ICD-10-CM | POA: Diagnosis not present

## 2020-01-23 ENCOUNTER — Other Ambulatory Visit: Payer: Self-pay | Admitting: Cardiovascular Disease

## 2020-01-24 DIAGNOSIS — M5116 Intervertebral disc disorders with radiculopathy, lumbar region: Secondary | ICD-10-CM | POA: Diagnosis not present

## 2020-01-24 DIAGNOSIS — M5416 Radiculopathy, lumbar region: Secondary | ICD-10-CM | POA: Diagnosis not present

## 2020-01-24 DIAGNOSIS — N200 Calculus of kidney: Secondary | ICD-10-CM | POA: Diagnosis not present

## 2020-01-24 DIAGNOSIS — M4316 Spondylolisthesis, lumbar region: Secondary | ICD-10-CM | POA: Diagnosis not present

## 2020-01-24 DIAGNOSIS — M4726 Other spondylosis with radiculopathy, lumbar region: Secondary | ICD-10-CM | POA: Diagnosis not present

## 2020-01-25 ENCOUNTER — Telehealth: Payer: Self-pay | Admitting: Cardiovascular Disease

## 2020-01-25 NOTE — Telephone Encounter (Signed)
° °  [facility] Pre-operative Risk Assessment    HEARTCARE STAFF: - Please ensure there is not already an duplicate clearance open for this procedure. - Under Visit Info/Reason for Call, type in Other and utilize the format Clearance MM/DD/YY or Clearance TBD. Do not use dashes or single digits. - If request is for dental extraction, please clarify the # of teeth to be extracted.  Request for surgical clearance:  1. What type of surgery is being performed? Right L4/5 hemilaminectomy with synovial cyst reduction(?)  2. When is this surgery scheduled? 02/13/20  3. What type of clearance is required (medical clearance vs. Pharmacy clearance to hold med vs. Both)? both  4. Are there any medications that need to be held prior to surgery and how long? Stop Aspirin 101m on (7 days before) 02/05/20 and restart (7 days after) 02/20/20  5. [facility]   6. What is the office phone number? [phone]  7.   What is the office fax number? [phone] 8.   Anesthesia type (None, local, MAC, general) ? Not listed    AAce Gins11/12/2019, 4:03 PM  _________________________________________________________________   (provider comments below)

## 2020-01-26 ENCOUNTER — Other Ambulatory Visit: Payer: Self-pay | Admitting: Neurosurgery

## 2020-01-26 NOTE — Telephone Encounter (Signed)
   Primary Cardiologist: Ida Rogue, MD  Chart reviewed as part of pre-operative protocol coverage.   Patient has a PMH of CAD s/p remote stenting to LAD, LCx (x2), and RCA in 2008 with mild ISR in LAD and LCx, as well as severe RCA disease managed with PCI/DES on LHC in 2015. Additionally he has a history of moderate carotid artery stenosis and CVA. He was last evaluated by cardiology via a telemedicine visit with Christell Faith, PA-C 12/14/19 and was without anginal complaints at that time.   Dr. Rockey Situ - can you comment on recommendations for holding aspirin prior to his upcoming spinal procedure. Surgeons are requesting 7 day hold before AND after surgery.   Please route your response back to P CV DIV PREOP.   Thank you!  Abigail Butts, PA-C 01/26/2020, 8:12 AM

## 2020-01-27 DIAGNOSIS — M5136 Other intervertebral disc degeneration, lumbar region: Secondary | ICD-10-CM | POA: Diagnosis not present

## 2020-01-27 DIAGNOSIS — M2560 Stiffness of unspecified joint, not elsewhere classified: Secondary | ICD-10-CM | POA: Diagnosis not present

## 2020-01-27 DIAGNOSIS — M5441 Lumbago with sciatica, right side: Secondary | ICD-10-CM | POA: Diagnosis not present

## 2020-01-27 DIAGNOSIS — M6281 Muscle weakness (generalized): Secondary | ICD-10-CM | POA: Diagnosis not present

## 2020-01-29 NOTE — Telephone Encounter (Signed)
Given complexity of coronary disease, numerous stents, also with underlying carotid disease, PAD, would prefer he stay on low-dose aspirin 81 mg daily If team is unable to perform the procedure on low-dose aspirin, it would be up to the patient whether he is willing to accept the risk of stroke or MI while off the aspirin for 14 days total, would expect at least moderate risk

## 2020-02-07 ENCOUNTER — Inpatient Hospital Stay: Admission: RE | Admit: 2020-02-07 | Payer: PPO | Source: Ambulatory Visit

## 2020-02-10 ENCOUNTER — Other Ambulatory Visit: Payer: PPO

## 2020-02-13 ENCOUNTER — Ambulatory Visit: Admission: RE | Admit: 2020-02-13 | Payer: PPO | Source: Home / Self Care | Admitting: Neurosurgery

## 2020-02-13 ENCOUNTER — Encounter: Admission: RE | Payer: Self-pay | Source: Home / Self Care

## 2020-02-13 SURGERY — HEMI-MICRODISCECTOMY LUMBAR LAMINECTOMY LEVEL 1
Anesthesia: General

## 2020-04-03 ENCOUNTER — Other Ambulatory Visit: Payer: Self-pay

## 2020-04-03 MED ORDER — METOPROLOL TARTRATE 50 MG PO TABS
50.0000 mg | ORAL_TABLET | Freq: Two times a day (BID) | ORAL | 2 refills | Status: DC
Start: 2020-04-03 — End: 2021-03-26

## 2020-04-03 NOTE — Telephone Encounter (Signed)
*  STAT* If patient is at the pharmacy, call can be transferred to refill team.   1. Which medications need to be refilled? (please list name of each medication and dose if known) Metoprolol  2. Which pharmacy/location (including street and city if local pharmacy) is medication to be sent to? [facility]  3. Do they need a 30 day or 90 day supply? [place]

## 2020-04-03 NOTE — Telephone Encounter (Signed)
Requested Prescriptions   Signed Prescriptions Disp Refills  . metoprolol tartrate (LOPRESSOR) 50 MG tablet 180 tablet 2    Sig: Take 1 tablet (50 mg total) by mouth 2 (two) times daily.    Authorizing Provider: Minna Merritts    Ordering User: Britt Bottom

## 2020-07-05 ENCOUNTER — Other Ambulatory Visit: Payer: Self-pay | Admitting: Cardiovascular Disease

## 2020-07-05 NOTE — Telephone Encounter (Signed)
Rx request sent to pharmacy.  

## 2020-07-13 DIAGNOSIS — H353290 Exudative age-related macular degeneration, unspecified eye, stage unspecified: Secondary | ICD-10-CM | POA: Diagnosis not present

## 2020-07-13 DIAGNOSIS — I69354 Hemiplegia and hemiparesis following cerebral infarction affecting left non-dominant side: Secondary | ICD-10-CM | POA: Diagnosis not present

## 2020-07-13 DIAGNOSIS — F17200 Nicotine dependence, unspecified, uncomplicated: Secondary | ICD-10-CM | POA: Diagnosis not present

## 2020-07-13 DIAGNOSIS — J449 Chronic obstructive pulmonary disease, unspecified: Secondary | ICD-10-CM | POA: Diagnosis not present

## 2020-07-13 DIAGNOSIS — D692 Other nonthrombocytopenic purpura: Secondary | ICD-10-CM | POA: Diagnosis not present

## 2020-07-13 DIAGNOSIS — I2510 Atherosclerotic heart disease of native coronary artery without angina pectoris: Secondary | ICD-10-CM | POA: Diagnosis not present

## 2020-07-13 DIAGNOSIS — E1159 Type 2 diabetes mellitus with other circulatory complications: Secondary | ICD-10-CM | POA: Diagnosis not present

## 2020-07-13 DIAGNOSIS — E1165 Type 2 diabetes mellitus with hyperglycemia: Secondary | ICD-10-CM | POA: Diagnosis not present

## 2020-07-13 DIAGNOSIS — E46 Unspecified protein-calorie malnutrition: Secondary | ICD-10-CM | POA: Diagnosis not present

## 2020-07-13 DIAGNOSIS — I2723 Pulmonary hypertension due to lung diseases and hypoxia: Secondary | ICD-10-CM | POA: Diagnosis not present

## 2020-07-13 DIAGNOSIS — M4800 Spinal stenosis, site unspecified: Secondary | ICD-10-CM | POA: Diagnosis not present

## 2020-07-13 DIAGNOSIS — G620 Drug-induced polyneuropathy: Secondary | ICD-10-CM | POA: Diagnosis not present

## 2020-07-31 DIAGNOSIS — R0902 Hypoxemia: Secondary | ICD-10-CM | POA: Diagnosis not present

## 2020-09-05 ENCOUNTER — Telehealth: Payer: Self-pay | Admitting: Cardiovascular Disease

## 2020-09-05 NOTE — Telephone Encounter (Signed)
  Patient Consent for Virtual Visit        Stanley Mclean has provided verbal consent on 09/05/2020 for a virtual visit (video or telephone).   CONSENT FOR VIRTUAL VISIT FOR:  [name]  By participating in this virtual visit I agree to the following:  I hereby voluntarily request, consent and authorize [facility] and its employed or contracted physicians, physician assistants, nurse practitioners or other licensed health care professionals (the Practitioner), to provide me with telemedicine health care services (the "Services") as deemed necessary by the treating Practitioner. I acknowledge and consent to receive the Services by the Practitioner via telemedicine. I understand that the telemedicine visit will involve communicating with the Practitioner through live audiovisual communication technology and the disclosure of certain medical information by electronic transmission. I acknowledge that I have been given the opportunity to request an in-person assessment or other available alternative prior to the telemedicine visit and am voluntarily participating in the telemedicine visit.  I understand that I have the right to withhold or withdraw my consent to the use of telemedicine in the course of my care at any time, without affecting my right to future care or treatment, and that the Practitioner or I may terminate the telemedicine visit at any time. I understand that I have the right to inspect all information obtained and/or recorded in the course of the telemedicine visit and may receive copies of available information for a reasonable fee.  I understand that some of the potential risks of receiving the Services via telemedicine include:  Delay or interruption in medical evaluation due to technological equipment failure or disruption; Information transmitted may not be sufficient (e.g. poor resolution of images) to allow for appropriate medical decision making by the Practitioner;  and/or  In rare instances, security protocols could fail, causing a breach of personal health information.  Furthermore, I acknowledge that it is my responsibility to provide information about my medical history, conditions and care that is complete and accurate to the best of my ability. I acknowledge that Practitioner's advice, recommendations, and/or decision may be based on factors not within their control, such as incomplete or inaccurate data provided by me or distortions of diagnostic images or specimens that may result from electronic transmissions. I understand that the practice of medicine is not an exact science and that Practitioner makes no warranties or guarantees regarding treatment outcomes. I acknowledge that a copy of this consent can be made available to me via my patient portal ([place]), or I can request a printed copy by calling the office of [facility].    I understand that my insurance will be billed for this visit.   I have read or had this consent read to me. I understand the contents of this consent, which adequately explains the benefits and risks of the Services being provided via telemedicine.  I have been provided ample opportunity to ask questions regarding this consent and the Services and have had my questions answered to my satisfaction. I give my informed consent for the services to be provided through the use of telemedicine in my medical care

## 2020-12-05 ENCOUNTER — Other Ambulatory Visit: Payer: Self-pay | Admitting: Cardiovascular Disease

## 2020-12-14 ENCOUNTER — Telehealth: Payer: PPO | Admitting: Cardiovascular Disease

## 2020-12-18 ENCOUNTER — Telehealth: Payer: Self-pay

## 2020-12-18 ENCOUNTER — Other Ambulatory Visit: Payer: Self-pay

## 2020-12-18 ENCOUNTER — Encounter: Payer: Self-pay | Admitting: Cardiovascular Disease

## 2020-12-18 ENCOUNTER — Ambulatory Visit (INDEPENDENT_AMBULATORY_CARE_PROVIDER_SITE_OTHER): Payer: PPO | Admitting: Cardiovascular Disease

## 2020-12-18 VITALS — BP 135/83 | HR 74 | Ht 69.0 in | Wt 114.0 lb

## 2020-12-18 DIAGNOSIS — E782 Mixed hyperlipidemia: Secondary | ICD-10-CM

## 2020-12-18 DIAGNOSIS — R7981 Abnormal blood-gas level: Secondary | ICD-10-CM

## 2020-12-18 DIAGNOSIS — I25118 Atherosclerotic heart disease of native coronary artery with other forms of angina pectoris: Secondary | ICD-10-CM | POA: Diagnosis not present

## 2020-12-18 DIAGNOSIS — R0902 Hypoxemia: Secondary | ICD-10-CM | POA: Diagnosis not present

## 2020-12-18 DIAGNOSIS — I10 Essential (primary) hypertension: Secondary | ICD-10-CM | POA: Diagnosis not present

## 2020-12-18 DIAGNOSIS — I6523 Occlusion and stenosis of bilateral carotid arteries: Secondary | ICD-10-CM

## 2020-12-18 DIAGNOSIS — F17200 Nicotine dependence, unspecified, uncomplicated: Secondary | ICD-10-CM

## 2020-12-18 DIAGNOSIS — J449 Chronic obstructive pulmonary disease, unspecified: Secondary | ICD-10-CM

## 2020-12-18 DIAGNOSIS — J432 Centrilobular emphysema: Secondary | ICD-10-CM

## 2020-12-18 NOTE — Telephone Encounter (Signed)
Was advised to reach out to Mr. Lun to refer him to ED for evaluation per PCP and pulmonary.   Pt had telephone visit with Dr. Rockey Situ, home 02 83% on RA, pt SOB, has not home supplemental oxygen, Hx of COPD. Dr. Rockey Situ advised on referral to pulmonary for  hypoxia, needs oxygen Severe COPD, continues to smoke Uses pulse oximeter at home,  sats 83 to 85% most of the time,  rare 79% in the Am. Best is 87 % at night  Message sent to pulmonary to advise on urgent referral based on above.   Dr. Patsey Berthold advised I would recommend adding the patient's primary care physician.  His O2 sats if correct, are too low and I agree he should be seen in the ED.  He likely needs imaging etc.  We are not equipped at the office to do these things in an acute manner.  Since he has difficulties with transportation I would recommend that he be seen in the ED he can call EMS for transportation.  Dr. Parks Ranger also agreed Thank you for FYI. Just reviewed these messages. Yes it sounds more severe of situation than could be handled easily outpatient. Will follow along with updates from [place] with Mr. Marcello Moores, pt agreed to have this RN call EMS to come evaluate pt for his hypoxia and transport him to the ED, advised once discharge he can follow-up with PCP and pulmonary as the referral has been placed. Educated pt that EMS will arrive and check his oxygen, BP, and other vitals, listen to his lungs, apply 02 if render, and possible EKG before transporting to the ED. Pt verbalized understanding and agrees with plan.  PCP, pulmonary updated that pt agreed to EMS transport to ED.  Dr. Rockey Situ notified as well.

## 2020-12-18 NOTE — Progress Notes (Signed)
Virtual Visit via Telephone Note   This visit type was conducted due to national recommendations for restrictions regarding the COVID-19 Pandemic (e.g. social distancing) in an effort to limit this patient's exposure and mitigate transmission in our community.  Due to his co-morbid illnesses, this patient is at least at moderate risk for complications without adequate follow up.  This format is felt to be most appropriate for this patient at this time.  The patient did not have access to video technology/had technical difficulties with video requiring transitioning to audio format only (telephone).  All issues noted in this document were discussed and addressed.  No physical exam could be performed with this format.  Please refer to the patient's chart for his  consent to telehealth for [facility].   I connected with  Stanley Mclean on 12/18/20 by a video enabled telemedicine application and verified that I am speaking with the correct person using two identifiers. I am contacting the patient above from our cardiology clinic office or alternate office work station to their home, I discussed the limitations of evaluation and management by telemedicine. The patient expressed understanding and agreed to proceed.   Evaluation Performed:  Follow-up visit  Date:  12/18/2020   ID:  Stanley Mclean, DOB 07/28/1940, MRN [id number]  Patient Location:  [place] [address]   Provider location:   [facility], [place] office  PCP:  Olin Hauser, DO  Cardiologist:  Arvid Right [facility]  Chief Complaint  Patient presents with   Other    12 month follow up -- Meds reviewed verbally with patient.      History of Present Illness:    Stanley Mclean is a 80 y.o. male who presents via audio/video conferencing for a telehealth visit today.   The patient does not symptoms concerning for COVID-19 infection (fever, chills, cough, or new SHORTNESS OF BREATH).    Patient has a past medical history of Stanley Mclean is a pleasant 80 year old gentleman with a  CVA DM II long smoking history who continues to smoke one pack per day,  coronary artery disease, stent to his mid LAD, 2 stents to  proximal LCX in May 2008, stent  proximal RCA July 2008, previous anginal symptoms  amputation of the right lower extremity from lawnmower accident 2 years ago who presents for routine followup of his coronary artery disease  Chronic shortness of breath on exertion Oxygen level running low,  Lives in the mid to low 80s,  smoke one pack per day  No recent episodes of bronchitis    Limited by chronic back pain and shortness of breath Blood pressure stable No  TIA or stroke symptoms Denies any palpitations concerning for arrhythmia   Previously declined cardiac catheterization for symptoms of chest pain   Lab work reviewed with him on today's visit Old labs: HBA1C 7.9, total cholesterol 130, LDL 64  Other past medical history reviewed CVA  03/2016  left arm deficits, Still only at 80% to 90% in terms of left hand strength   Other past medical history reviewed MRI results  Acute 1 cm nonhemorrhagic deep white matter infarct, RIGHT hemisphere. Atrophy and small vessel disease. Apparent high-grade stenosis of the vertical petrous segment RIGHT ICA, likely focal atherosclerosis. Intracranial atherosclerotic disease, most notably affecting the RIGHT greater than LEFT M2 and M3 MCA branches.   CT neck  Plaque right carotid bifurcation with 64% maximal diameter stenosis at the junction of  the right common carotid artery and proximal right internal carotid artery.   Plaque pre cavernous and cavernous segment right internal carotid artery with moderate narrowing.   Calcified plaque left carotid bifurcation. Calcified and noncalcified plaque proximal left internal carotid artery. Less than 60% diameter stenosis.   Calcified plaque left internal carotid  artery cavernous segment with mild narrowing.   Cath by Dr. Ronalee Belts performed showing   50% RICA with normal distal right ICA    cardiac catheterization 06/06/2013, found to have severe distal RCA disease, mild in-stent restenosis in the circumflex and LAD, normal ejection fraction. 40% mid LAD disease, 40% B1 disease, 30% proximal circumflex also noted. He had Xience stent 2.5 x 15 mm placed in the distal RCA.    History of neuropathy.  Past Medical History:  Diagnosis Date   Anemia    Basal cell carcinoma    BPH (benign prostatic hyperplasia)    CAD (coronary artery disease)    a. s/p multiple prior PCIs b. s/p DES-distal RCA 05/2013   COPD (chronic obstructive pulmonary disease) (HCC)    GERD (gastroesophageal reflux disease)    Macular degeneration    Pure hypercholesterolemia    Stroke ([place])    Tobacco abuse    Traumatic amputation of leg(s) (complete) (partial), unilateral, level not specified, without mention of complication 40/98/1191   Right Below Knee Amputation   Unspecified essential hypertension    Past Surgical History:  Procedure Laterality Date   AMPUTATION Right 08/26/2011   Below Knee Amputee   APPENDECTOMY     CARDIAC CATHETERIZATION     CAROTID ANGIOGRAPHY Right 05/14/2016   Procedure: Carotid Angiography;  Surgeon: Katha Cabal, MD;  Location: [place] [facility];  Service: Cardiovascular;  Laterality: Right;   CAROTID PTA/STENT INTERVENTION  05/14/2016   Procedure: Carotid PTA/Stent Intervention;  Surgeon: Katha Cabal, MD;  Location: [place] [facility];  Service: Cardiovascular;;   CORONARY ANGIOPLASTY WITH STENT PLACEMENT  09/28/2006   RCA   CORONARY ANGIOPLASTY WITH STENT PLACEMENT  07/21/2006   Mid LAD   CORONARY ANGIOPLASTY WITH STENT PLACEMENT  06/06/2013   30% prox LAD at the site of a prior stent, 40% mid LAD at the origin of D1, 40% ostial D1, 30% prox LCx at the site of a prior stent, in the proximal third of the vessel segment, 20% prox  RCA, 99% distal RCA s/p DES. EF >55%.     Allergies:   Patient has no known allergies.   Social History   Tobacco Use   Smoking status: Every Day    Packs/day: 1.00    Years: 50.00    Pack years: 50.00    Types: Cigarettes    Last attempt to quit: 03/29/2016    Years since quitting: 4.7   Smokeless tobacco: Never  Vaping Use   Vaping Use: Never used  Substance Use Topics   Alcohol use: No   Drug use: No     Current Outpatient Medications on File Prior to Visit  Medication Sig Dispense Refill   albuterol (PROVENTIL HFA;VENTOLIN HFA) 108 (90 Base) MCG/ACT inhaler Inhale 2 puffs into the lungs every 6 (six) hours as needed for wheezing or shortness of breath. 6 Inhaler 1   amLODipine (NORVASC) 10 MG tablet Take 5 mg by mouth daily.     aspirin EC 81 MG tablet Take 1 tablet (81 mg total) by mouth daily. 90 tablet 3   atorvastatin (LIPITOR) 40 MG tablet TAKE 1 TABLET BY MOUTH EVERY  DAY 90 tablet 0   lisinopril (ZESTRIL) 30 MG tablet TAKE 1 TABLET BY MOUTH EVERY DAY 90 tablet 1   metFORMIN (GLUCOPHAGE) 500 MG tablet Take 500 mg by mouth daily with breakfast.     metoprolol tartrate (LOPRESSOR) 50 MG tablet Take 1 tablet (50 mg total) by mouth 2 (two) times daily. 180 tablet 2   nitroGLYCERIN (NITROSTAT) 0.4 MG SL tablet Place 1 tablet (0.4 mg total) under the tongue every 5 (five) minutes as needed for chest pain. 25 tablet 0   terazosin (HYTRIN) 1 MG capsule Take 1 capsule (1 mg total) by mouth at bedtime. 90 capsule 1   TRELEGY ELLIPTA 200-62.5-25 MCG/INH AEPB INHALE 1 PUFF DAILY     No current facility-administered medications on file prior to visit.     Family Hx: The patient's family history includes Mental illness in his mother.  ROS:   Please see the history of present illness.    Review of Systems  Constitutional: Negative.   HENT: Negative.    Respiratory: Negative.    Cardiovascular: Negative.   Gastrointestinal: Negative.   Musculoskeletal: Negative.    Neurological: Negative.   Psychiatric/Behavioral: Negative.    All other systems reviewed and are negative.    Labs/Other Tests and Data Reviewed:    Recent Labs: No results found for requested labs within last 8760 hours.   Recent Lipid Panel Lab Results  Component Value Date/Time   CHOL 123 07/07/2017 12:00 AM   CHOL 166 12/29/2013 10:33 AM   CHOL 175 06/01/2013 11:33 AM   TRIG 131 07/07/2017 12:00 AM   TRIG 211 (H) 06/01/2013 11:33 AM   HDL 61 07/07/2017 12:00 AM   HDL 43 12/29/2013 10:33 AM   HDL 39 (L) 06/01/2013 11:33 AM   CHOLHDL 3.3 03/30/2016 05:59 AM   LDLCALC 61 07/07/2017 12:00 AM   LDLCALC 69 12/29/2013 10:33 AM   LDLCALC 94 06/01/2013 11:33 AM    Wt Readings from Last 3 Encounters:  12/18/20 114 lb (51.7 kg)  12/14/19 140 lb (63.5 kg)  07/26/19 125 lb (56.7 kg)     Exam:    Vital Signs: Vital signs may also be detailed in the HPI BP 135/83   Pulse 74   Ht _0  (1.753 m)   Wt 114 lb (51.7 kg)   SpO2 (!) 83%   BMI 16.83 kg/m    Well nourished, well developed male in no acute distress. Constitutional:  oriented to person, place, and time. No distress.    ASSESSMENT & PLAN:    Problem List Items Addressed This Visit       Cardiology Problems   Carotid artery stenosis   Relevant Medications   amLODipine (NORVASC) 10 MG tablet   Essential hypertension   Relevant Medications   amLODipine (NORVASC) 10 MG tablet   Hyperlipidemia   Relevant Medications   amLODipine (NORVASC) 10 MG tablet   Coronary artery disease of native artery of native heart with stable angina pectoris (HCC) - Primary   Relevant Medications   amLODipine (NORVASC) 10 MG tablet     Other   Centrilobular emphysema (HCC)   Relevant Medications   TRELEGY ELLIPTA 200-62.5-25 MCG/INH AEPB   SOB (shortness of breath) - Plan: EKG 12-Lead Long history of severe underlying COPD Continues to smoke one pack per day, has done so for decades No recent COPD exacerbation Uses  pulse oximeter at home, sats 83 to 85% most of the time, rare 79% in the Am. Best is  87 % at night, likely his baseline numbers Does not feel in distress but realizes his numbers are low.  Denies any sputum production, no bronchitis/pneumonia symptoms.  Feels he needs oxygen, he is receptive to referral to pulmonary to discuss   Coronary artery disease involving native coronary artery of native heart with angina pectoris ([place]) -  Currently with no symptoms of angina. No further workup at this time. Continue current medication regimen. High risk of ischemia given continued smoking He does not want workup   Essential hypertension Blood pressure is well controlled on today's visit. No changes made to the medications. Stable   Smoker We have encouraged him to continue to work on weaning his cigarettes and smoking cessation. He will continue to work on this and does not want any assistance with chantix.    Controlled type 2 diabetes mellitus without complication, without long-term current use of insulin ([place]) on metformin and followed by primary care, 500 milligrams a day   CVA diffuse atherosclerosis recommended aggressive diabetes control, smoking cessation no strong indication of arrhythmia No recent TIA or stroke symptoms  Back pain,  Not driving to much pain  COVID-19 Education: The signs and symptoms of COVID-19 were discussed with the patient and how to seek care for testing (follow up with PCP or arrange E-visit).  The importance of social distancing was discussed today.  Patient Risk:   After full review of this patients clinical status, I feel that they are at least moderate risk at this time.  Time:   Today, I have spent 25 minutes with the patient with telehealth technology discussing the cardiac and medical problems/diagnoses detailed above   Additional 10 min spent reviewing the chart prior to patient visit today   Medication Adjustments/Labs and Tests Ordered: Current  medicines are reviewed at length with the patient today.  Concerns regarding medicines are outlined above.   Tests Ordered: No tests ordered   Medication Changes: No changes made   Signed, Ida Rogue, MD  [place] Office [address] [place] [address], [place], [place] [address]

## 2020-12-18 NOTE — Telephone Encounter (Signed)
Received below message via epic secure chat from Lucia Bitter, Therapist, sports.  Urgent consult  hypoxia, needs oxygen Severe COPD, continues to smoke Uses pulse oximeter at home,  sats 83 to 85% most of the time,  rare 79% in the Am. Best is 87 % at night  Our scheduler contacted patient and offered OV for 12/20/2020. Patient declined appt due to transportation issues.   Added Dr. Patsey Berthold and PCP to the chat for recommendations. Both agreed with ED.  Estill Bamberg will contact patient with recommendations.

## 2020-12-18 NOTE — Patient Instructions (Addendum)
We placed a referral to pulmonary due to your low oxygen, they should reach out to you in the next day or two to schedule your first appointment.   Medication Instructions:  No changes  If you need a refill on your cardiac medications before your next appointment, please call your pharmacy.   Lab work: No new labs needed  Testing/Procedures: No new testing needed  Follow-Up: At [facility], you and your health needs are our priority.  As part of our continuing mission to provide you with exceptional heart care, we have created designated Provider Care Teams.  These Care Teams include your primary Cardiologist (physician) and Advanced Practice Providers (APPs -  Physician Assistants and Nurse Practitioners) who all work together to provide you with the care you need, when you need it.  You will need a follow up appointment in 6 months  Providers on your designated Care Team:   Murray Hodgkins, NP Christell Faith, PA-C Marrianne Mood, PA-C Cadence [place], [place]  COVID-19 Vaccine Information can be found at: [email or web address] For questions related to vaccine distribution or appointments, please email [email or web address] or call [phone].

## 2020-12-20 ENCOUNTER — Institutional Professional Consult (permissible substitution): Payer: PPO | Admitting: Pulmonary Disease

## 2021-02-22 ENCOUNTER — Other Ambulatory Visit: Payer: Self-pay

## 2021-02-22 ENCOUNTER — Inpatient Hospital Stay
Admission: EM | Admit: 2021-02-22 | Discharge: 2021-02-24 | DRG: 193 | Disposition: A | Discharge status: Home Health | Payer: PPO | Attending: Internal Medicine | Admitting: Internal Medicine

## 2021-02-22 ENCOUNTER — Emergency Department: Payer: PPO

## 2021-02-22 DIAGNOSIS — E1165 Type 2 diabetes mellitus with hyperglycemia: Secondary | ICD-10-CM | POA: Diagnosis not present

## 2021-02-22 DIAGNOSIS — R0689 Other abnormalities of breathing: Secondary | ICD-10-CM | POA: Diagnosis not present

## 2021-02-22 DIAGNOSIS — J440 Chronic obstructive pulmonary disease with acute lower respiratory infection: Secondary | ICD-10-CM | POA: Diagnosis not present

## 2021-02-22 DIAGNOSIS — E7800 Pure hypercholesterolemia, unspecified: Secondary | ICD-10-CM | POA: Diagnosis present

## 2021-02-22 DIAGNOSIS — Z7984 Long term (current) use of oral hypoglycemic drugs: Secondary | ICD-10-CM

## 2021-02-22 DIAGNOSIS — R0902 Hypoxemia: Secondary | ICD-10-CM | POA: Diagnosis not present

## 2021-02-22 DIAGNOSIS — R739 Hyperglycemia, unspecified: Secondary | ICD-10-CM | POA: Diagnosis not present

## 2021-02-22 DIAGNOSIS — I10 Essential (primary) hypertension: Secondary | ICD-10-CM | POA: Diagnosis not present

## 2021-02-22 DIAGNOSIS — I2510 Atherosclerotic heart disease of native coronary artery without angina pectoris: Secondary | ICD-10-CM | POA: Diagnosis not present

## 2021-02-22 DIAGNOSIS — Z89511 Acquired absence of right leg below knee: Secondary | ICD-10-CM

## 2021-02-22 DIAGNOSIS — Z20822 Contact with and (suspected) exposure to covid-19: Secondary | ICD-10-CM | POA: Diagnosis not present

## 2021-02-22 DIAGNOSIS — Z7951 Long term (current) use of inhaled steroids: Secondary | ICD-10-CM | POA: Diagnosis not present

## 2021-02-22 DIAGNOSIS — J209 Acute bronchitis, unspecified: Secondary | ICD-10-CM | POA: Diagnosis present

## 2021-02-22 DIAGNOSIS — L89151 Pressure ulcer of sacral region, stage 1: Secondary | ICD-10-CM | POA: Diagnosis not present

## 2021-02-22 DIAGNOSIS — J189 Pneumonia, unspecified organism: Principal | ICD-10-CM

## 2021-02-22 DIAGNOSIS — J9601 Acute respiratory failure with hypoxia: Secondary | ICD-10-CM | POA: Diagnosis not present

## 2021-02-22 DIAGNOSIS — J9621 Acute and chronic respiratory failure with hypoxia: Secondary | ICD-10-CM

## 2021-02-22 DIAGNOSIS — Z8673 Personal history of transient ischemic attack (TIA), and cerebral infarction without residual deficits: Secondary | ICD-10-CM

## 2021-02-22 DIAGNOSIS — J441 Chronic obstructive pulmonary disease with (acute) exacerbation: Secondary | ICD-10-CM | POA: Diagnosis not present

## 2021-02-22 DIAGNOSIS — Z7982 Long term (current) use of aspirin: Secondary | ICD-10-CM | POA: Diagnosis not present

## 2021-02-22 DIAGNOSIS — Z955 Presence of coronary angioplasty implant and graft: Secondary | ICD-10-CM

## 2021-02-22 DIAGNOSIS — J80 Acute respiratory distress syndrome: Secondary | ICD-10-CM | POA: Diagnosis not present

## 2021-02-22 DIAGNOSIS — L8990 Pressure ulcer of unspecified site, unspecified stage: Secondary | ICD-10-CM | POA: Insufficient documentation

## 2021-02-22 DIAGNOSIS — R0602 Shortness of breath: Secondary | ICD-10-CM | POA: Diagnosis not present

## 2021-02-22 DIAGNOSIS — I4510 Unspecified right bundle-branch block: Secondary | ICD-10-CM | POA: Diagnosis not present

## 2021-02-22 LAB — BASIC METABOLIC PANEL
Anion gap: 7 (ref 5–15)
BUN: 20 mg/dL (ref 8–23)
CO2: 32 mmol/L (ref 22–32)
Calcium: 9.1 mg/dL (ref 8.9–10.3)
Chloride: 95 mmol/L — ABNORMAL LOW (ref 98–111)
Creatinine, Ser: 0.94 mg/dL (ref 0.61–1.24)
GFR, Estimated: >60 mL/min (ref >60)
Glucose, Bld: 286 mg/dL — ABNORMAL HIGH (ref 70–99)
Potassium: 4.3 mmol/L (ref 3.5–5.1)
Sodium: 134 mmol/L — ABNORMAL LOW (ref 135–145)

## 2021-02-22 LAB — CBC WITH DIFFERENTIAL/PLATELET
Abs Immature Granulocytes: 0.04 K/uL (ref 0.00–0.07)
Basophils Absolute: 0 K/uL (ref 0.0–0.1)
Basophils Relative: 0 %
Eosinophils Absolute: 0 K/uL (ref 0.0–0.5)
Eosinophils Relative: 0 %
HCT: 47.3 % (ref 39.0–52.0)
Hemoglobin: 15.6 g/dL (ref 13.0–17.0)
Immature Granulocytes: 0 %
Lymphocytes Relative: 7 %
Lymphs Abs: 0.9 K/uL (ref 0.7–4.0)
MCH: 31.8 pg (ref 26.0–34.0)
MCHC: 33 g/dL (ref 30.0–36.0)
MCV: 96.3 fL (ref 80.0–100.0)
Monocytes Absolute: 0.9 K/uL (ref 0.1–1.0)
Monocytes Relative: 7 %
Neutro Abs: 10.4 K/uL — ABNORMAL HIGH (ref 1.7–7.7)
Neutrophils Relative %: 86 %
Platelets: 210 K/uL (ref 150–400)
RBC: 4.91 MIL/uL (ref 4.22–5.81)
RDW: 13.2 % (ref 11.5–15.5)
WBC: 12.2 K/uL — ABNORMAL HIGH (ref 4.0–10.5)
nRBC: 0 % (ref 0.0–0.2)

## 2021-02-22 LAB — RESP PANEL BY RT-PCR (FLU A&B, COVID) ARPGX2
Influenza A by PCR: NEGATIVE
Influenza B by PCR: NEGATIVE
SARS Coronavirus 2 by RT PCR: NEGATIVE

## 2021-02-22 LAB — TROPONIN I (HIGH SENSITIVITY): Troponin I (High Sensitivity): 13 ng/L (ref <18)

## 2021-02-22 MED ORDER — SODIUM CHLORIDE 0.9 % IV SOLN
500.0000 mg | INTRAVENOUS | Status: DC
  Administered 2021-02-23 (×2): 500 mg via INTRAVENOUS
  Filled 2021-02-22 (×3): qty 5

## 2021-02-22 MED ORDER — PREDNISONE 20 MG PO TABS
40.0000 mg | ORAL_TABLET | Freq: Every day | ORAL | Status: DC
  Administered 2021-02-24: 40 mg via ORAL
  Filled 2021-02-22: qty 2

## 2021-02-22 MED ORDER — IPRATROPIUM-ALBUTEROL 0.5-2.5 (3) MG/3ML IN SOLN
3.0000 mL | Freq: Four times a day (QID) | RESPIRATORY_TRACT | Status: DC
  Administered 2021-02-23 – 2021-02-24 (×6): 3 mL via RESPIRATORY_TRACT
  Filled 2021-02-22 (×6): qty 3

## 2021-02-22 MED ORDER — METHYLPREDNISOLONE SODIUM SUCC 40 MG IJ SOLR
40.0000 mg | Freq: Two times a day (BID) | INTRAMUSCULAR | Status: AC
  Administered 2021-02-23 (×2): 40 mg via INTRAVENOUS
  Filled 2021-02-22 (×2): qty 1

## 2021-02-22 MED ORDER — INSULIN ASPART 100 UNIT/ML IJ SOLN
0.0000 [IU] | Freq: Three times a day (TID) | INTRAMUSCULAR | Status: DC
  Administered 2021-02-23 (×2): 3 [IU] via SUBCUTANEOUS
  Administered 2021-02-23: 8 [IU] via SUBCUTANEOUS
  Administered 2021-02-24: 2 [IU] via SUBCUTANEOUS
  Filled 2021-02-22 (×4): qty 1

## 2021-02-22 MED ORDER — SODIUM CHLORIDE 0.9 % IV SOLN
1.0000 g | Freq: Once | INTRAVENOUS | Status: DC
  Filled 2021-02-22: qty 10

## 2021-02-22 MED ORDER — SODIUM CHLORIDE 0.9 % IV SOLN
2.0000 g | INTRAVENOUS | Status: DC
  Administered 2021-02-23 (×2): 2 g via INTRAVENOUS
  Filled 2021-02-22 (×3): qty 20

## 2021-02-22 MED ORDER — IPRATROPIUM-ALBUTEROL 0.5-2.5 (3) MG/3ML IN SOLN
3.0000 mL | Freq: Once | RESPIRATORY_TRACT | Status: AC
  Administered 2021-02-22: 3 mL via RESPIRATORY_TRACT
  Filled 2021-02-22: qty 3

## 2021-02-22 MED ORDER — ALBUTEROL SULFATE (2.5 MG/3ML) 0.083% IN NEBU: 2.5000 mg | INHALATION_SOLUTION | RESPIRATORY_TRACT | Status: DC | PRN

## 2021-02-22 MED ORDER — INSULIN ASPART 100 UNIT/ML IJ SOLN
0.0000 [IU] | Freq: Every day | INTRAMUSCULAR | Status: DC
  Administered 2021-02-23: 3 [IU] via SUBCUTANEOUS
  Filled 2021-02-22: qty 1

## 2021-02-22 MED ORDER — ENOXAPARIN SODIUM 40 MG/0.4ML IJ SOSY
40.0000 mg | PREFILLED_SYRINGE | INTRAMUSCULAR | Status: DC
  Administered 2021-02-23 (×2): 40 mg via SUBCUTANEOUS
  Filled 2021-02-22 (×2): qty 0.4

## 2021-02-22 MED ORDER — SODIUM CHLORIDE 0.9 % IV SOLN: 500.0000 mg | Freq: Once | INTRAVENOUS | Status: DC

## 2021-02-22 NOTE — ED Notes (Signed)
Family at bedside. 

## 2021-02-22 NOTE — ED Provider Notes (Signed)
[facility] Emergency Department Provider Note   ____________________________________________   I have reviewed the triage vital signs and the nursing notes.   HISTORY  Chief Complaint Shortness of Breath   History limited by: Not Limited   HPI Stanley Mclean is a 80 y.o. male who presents to the emergency department today because of concern for shortness of breath.  The patient states he has a history of COPD started feeling some increasing shortness of breath yesterday.  It became worse today.  He did try his home albuterol without any significant relief.  Denies any significant chest pain.  No productive cough. When EMS arrived patient was found to be hypoxic. Given solumedrol, magnesium and breathing treatment by EMS. Patient has never required supplemental oxygen in the past.    Records reviewed. Per medical record review patient has a history of COPD  Past Medical History:  Diagnosis Date   Anemia    Basal cell carcinoma    BPH (benign prostatic hyperplasia)    CAD (coronary artery disease)    a. s/p multiple prior PCIs b. s/p DES-distal RCA 05/2013   COPD (chronic obstructive pulmonary disease) (HCC)    GERD (gastroesophageal reflux disease)    Macular degeneration    Pure hypercholesterolemia    Stroke ([place])    Tobacco abuse    Traumatic amputation of leg(s) (complete) (partial), unilateral, level not specified, without mention of complication 02/40/9735   Right Below Knee Amputation   Unspecified essential hypertension     Patient Active Problem List   Diagnosis Date Noted   Renal cyst, acquired, right 11/24/2019   Chronic bilateral low back pain with left-sided sciatica 10/16/2017   Phantom limb syndrome with pain ([place]) 07/29/2017   Primary osteoarthritis involving multiple joints 07/29/2017   Hyperlipidemia 11/19/2016   Carotid artery stenosis 05/05/2016   History of CVA (cerebrovascular accident) 03/30/2016   Centrilobular emphysema  ([place]) 03/27/2015   Asthmatic bronchitis 01/05/2015   Sensation of cold in leg 06/19/2014   Essential hypertension 06/22/2013   Coronary artery disease of native artery of native heart with stable angina pectoris ([place]) 06/01/2013   SOB (shortness of breath) 06/01/2013   Smoker 06/01/2013   S/P coronary artery stent placement 06/01/2013   DM (diabetes mellitus), type 2, uncontrolled, periph vascular complic 32/99/2426   Below knee amputation status, right ([place]) 06/01/2013    Past Surgical History:  Procedure Laterality Date   AMPUTATION Right 08/26/2011   Below Knee Amputee   APPENDECTOMY     CARDIAC CATHETERIZATION     CAROTID ANGIOGRAPHY Right 05/14/2016   Procedure: Carotid Angiography;  Surgeon: Katha Cabal, MD;  Location: [place] [facility];  Service: Cardiovascular;  Laterality: Right;   CAROTID PTA/STENT INTERVENTION  05/14/2016   Procedure: Carotid PTA/Stent Intervention;  Surgeon: Katha Cabal, MD;  Location: [place] [facility];  Service: Cardiovascular;;   CORONARY ANGIOPLASTY WITH STENT PLACEMENT  09/28/2006   RCA   CORONARY ANGIOPLASTY WITH STENT PLACEMENT  07/21/2006   Mid LAD   CORONARY ANGIOPLASTY WITH STENT PLACEMENT  06/06/2013   30% prox LAD at the site of a prior stent, 40% mid LAD at the origin of D1, 40% ostial D1, 30% prox LCx at the site of a prior stent, in the proximal third of the vessel segment, 20% prox RCA, 99% distal RCA s/p DES. EF >55%.    Prior to Admission medications   Medication Sig Start Date End Date Taking? Authorizing Provider  albuterol (PROVENTIL HFA;VENTOLIN  HFA) 108 (90 Base) MCG/ACT inhaler Inhale 2 puffs into the lungs every 6 (six) hours as needed for wheezing or shortness of breath. 11/19/16 12/18/20  Mikey College, NP  amLODipine (NORVASC) 10 MG tablet Take 5 mg by mouth daily.    [provider]  aspirin EC 81 MG tablet Take 1 tablet (81 mg total) by mouth daily. 12/01/18   Minna Merritts, MD  atorvastatin  (LIPITOR) 40 MG tablet TAKE 1 TABLET BY MOUTH EVERY DAY 10/06/18   Parks Ranger, Devonne Doughty, DO  lisinopril (ZESTRIL) 30 MG tablet TAKE 1 TABLET BY MOUTH EVERY DAY 04/01/19   Karamalegos, Devonne Doughty, DO  metFORMIN (GLUCOPHAGE) 500 MG tablet Take 500 mg by mouth daily with breakfast.    [provider]  metoprolol tartrate (LOPRESSOR) 50 MG tablet Take 1 tablet (50 mg total) by mouth 2 (two) times daily. 04/03/20   Minna Merritts, MD  nitroGLYCERIN (NITROSTAT) 0.4 MG SL tablet Place 1 tablet (0.4 mg total) under the tongue every 5 (five) minutes as needed for chest pain. 10/13/17   Minna Merritts, MD  terazosin (HYTRIN) 1 MG capsule Take 1 capsule (1 mg total) by mouth at bedtime. 07/05/20   Minna Merritts, MD  [place] 200-62.5-25 MCG/INH AEPB INHALE 1 PUFF DAILY 12/04/20   [provider]    Allergies Patient has no known allergies.  Family History  Problem Relation Age of Onset   Mental illness Mother     Social History Social History   Tobacco Use   Smoking status: Every Day    Packs/day: 1.00    Years: 50.00    Pack years: 50.00    Types: Cigarettes    Last attempt to quit: 03/29/2016    Years since quitting: 4.9   Smokeless tobacco: Never  Vaping Use   Vaping Use: Never used  Substance Use Topics   Alcohol use: No   Drug use: No    Review of Systems Constitutional: No fever/chills Eyes: No visual changes. ENT: No sore throat. Cardiovascular: Denies chest pain. Respiratory: Positive for shortness of breath. Gastrointestinal: No abdominal pain.  No nausea, no vomiting.  No diarrhea.   Genitourinary: Negative for dysuria. Musculoskeletal: Negative for back pain. Skin: Negative for rash. Neurological: Negative for headaches, focal weakness or numbness.  ____________________________________________   PHYSICAL EXAM:  VITAL SIGNS: ED Triage Vitals  Enc Vitals Group     BP 02/22/21 2154 (!) 160/80     Pulse Rate 02/22/21 2154 94      Resp 02/22/21 2154 (!) 24     Temp 02/22/21 2154 98.5 F (36.9 C)     Temp Source 02/22/21 2154 Oral     SpO2 02/22/21 2148 96 %     Weight 02/22/21 2150 114 lb (51.7 kg)     Height 02/22/21 2150 _0  (1.753 m)     Head Circumference --      Peak Flow --      Pain Score 02/22/21 2153 0   Constitutional: Alert and oriented.  Eyes: Conjunctivae are normal.  ENT      Head: Normocephalic and atraumatic.      Nose: No congestion/rhinnorhea.      Mouth/Throat: Mucous membranes are moist.      Neck: No stridor. Hematological/Lymphatic/Immunilogical: No cervical lymphadenopathy. Cardiovascular: Normal rate, regular rhythm.  No murmurs, rubs, or gallops.  Respiratory: Increased respiratory effort. Poor air movement diffusely.  Gastrointestinal: Soft and non tender. No rebound. No guarding.  Genitourinary:  Deferred Musculoskeletal: Normal range of motion in all extremities. No lower extremity edema. Neurologic:  Normal speech and language. No gross focal neurologic deficits are appreciated.  Skin:  Skin is warm, dry and intact. No rash noted. Psychiatric: Mood and affect are normal. Speech and behavior are normal. Patient exhibits appropriate insight and judgment.  ____________________________________________    LABS (pertinent positives/negatives)  CBC wbc 12.2, hgb 15.6, plt 210 COVID, influenza negative Trop hs 13 BMP na 134, k 4.3, glu 286 ____________________________________________   EKG  I, Nance Pear, attending physician, personally viewed and interpreted this EKG  EKG Time: 2149 Rate: 98 Rhythm: sinus rhythm Axis: left axis deviation Intervals: qtc 509 QRS: RBBB, LAFB ST changes: no st elevation Impression: abnormal ekg ____________________________________________    RADIOLOGY  CXR IMPRESSION:  1. Diffuse interstitial prominence may be chronic or represent mild  edema. Atypical pneumonia is not excluded. Follow-up recommended.  2. Left upper lobe  nodule versus focal edema.  3. Probable trace bilateral pleural effusions.   ____________________________________________   PROCEDURES  Procedures  ____________________________________________   INITIAL IMPRESSION / ASSESSMENT AND PLAN / ED COURSE  Pertinent labs & imaging results that were available during my care of the patient were reviewed by me and considered in my medical decision making (see chart for details).   Patient presented to the emergency department today with concerns for shortness of breath over the past couple days.  Patient was found hypoxic by EMS and EMS started treatment for COPD.  My initial exam did have poor air movement diffusely throughout his lungs.  Chest x-ray was concerning for possible atypical pneumonia patient mild leukocytosis.  COVID and influenza were negative.  Will start IV antibiotics here in the emergency department.  Will plan on admission to the hospital service.  Discussed with patient.  ___________________________________________   FINAL CLINICAL IMPRESSION(S) / ED DIAGNOSES  Final diagnoses:  COPD exacerbation ([place])  Community acquired pneumonia, unspecified laterality     Note: This dictation was prepared with Sales executive. Any transcriptional errors that result from this process are unintentional     Nance Pear, MD 02/22/21 2322

## 2021-02-22 NOTE — H&P (Signed)
History and Physical    Stanley Mclean PFY:[id number] DOB: 03/23/1940 DOA: 02/22/2021  PCP: Olin Hauser, DO   Patient coming from: home  I have personally briefly reviewed patient's relevant medical records in [place]  Chief Complaint: shortness of breath  HPI: Stanley Mclean is a 80 y.o. male with medical history significant for COPD, CAD with history of stent, DM, HTN, CVA, Right BKA, brought in by EMS with shortness of breath and respiratory distress which started the day prior but became acutely worse on the day of arrival.  He tried his home albuterol without relief.  He has a dry cough but denies fever or chills.  Denies chest pain.  Denies nausea, vomiting, diarrhea or abdominal pain.  On arrival of EMS, he was found to be tripoding with O2 sats 70% on room air and respiratory rate 45-50.  He was treated with DuoNeb, mag sulfate and Solu-Medrol in route and was placed on 4 L O2 with improvement in O2 sat to 96% by arrival  ED course: BP 160/80 with respirations 24 and O2 sat 96% on 4 L Blood work with WBC 12,000.  Troponin 13.  Blood glucose 286 COVID and flu negative  EKG, personally viewed and interpreted: Sinus at 98 with no acute ST-T wave changes  Chest x-ray: Diffuse interstitial prominence may be chronic or represent mild edema.  Atypical pneumonia not excluded.  Probable trace bilateral pleural effusions  Patient treated with Rocephin and azithromycin and an additional DuoNeb.  Hospitalist consulted for admission.   Review of Systems: As per HPI otherwise all other systems on review of systems negative.   Assessment/Plan  Acute respiratory failure with hypoxia (HCC) - Suspect COPD exacerbation, possible pneumonia but will also evaluate for fluid overload given - Follow BNP - Treat acute conditions - Supplemental oxygen to keep sats over 92%  Possible atypical pneumonia   COPD with acute bronchitis (HCC) - Schedule and as needed nebulized  bronchodilator treatments - IV steroids - We will treat with Rocephin and azithromycin - Antitussives and symptomatic treatment  Hyperglycemia and type II diabetic - Sliding scale insulin - Monitor for worsening hyperglycemia given treatment with IV steroids      CAD (coronary artery disease)   S/P coronary artery stent placement - No complaints of chest pain, first troponin 13 and EKG is nonacute - Continue aspirin, metoprolol, nitroglycerin, atorvastatin    Essential hypertension - Continue amlodipine, lisinopril, metoprolol    History of CVA (cerebrovascular accident) - Continue aspirin and atorvastatin    DVT prophylaxis: Lovenox  Code Status: full code  Family Communication:  none  Disposition Plan: Back to previous home environment Consults called: none  Status:At the time of admission, it appears that the appropriate admission status for this patient is INPATIENT. This is judged to be reasonable and necessary in order to provide the required intensity of service to ensure the patient's safety given the presenting symptoms, physical exam findings, and initial radiographic and laboratory data in the context of their  Comorbid conditions.   Patient requires inpatient status due to high intensity of service, high risk for further deterioration and high frequency of surveillance required.   I certify that at the point of admission it is my clinical judgment that the patient will require inpatient hospital care spanning beyond 2 midnights     Physical Exam: Vitals:   02/22/21 2148 02/22/21 2150 02/22/21 2154 02/22/21 2230  BP:   (!) 160/80 (!) 128/57  Pulse:  94 86  Resp:   (!) 24 16  Temp:   98.5 F (36.9 C)   TempSrc:   Oral   SpO2: 96%  96% 97%  Weight:  51.7 kg    Height:  _0  (1.753 m)     Constitutional: Alert, oriented x 3 .  Conversational dyspnea HEENT:      Head: Normocephalic and atraumatic.         Eyes: PERLA, EOMI, Conjunctivae are normal. Sclera is  non-icteric.       Mouth/Throat: Mucous membranes are moist.       Neck: Supple with no signs of meningismus. Cardiovascular: Regular rate and rhythm. No murmurs, gallops, or rubs. 2+ symmetrical distal pulses are present . No JVD. 1-2+ left putting LE edema Respiratory: Respiratory effort increased.Lungs sounds diminished with scattered rhonchi Gastrointestinal: Soft, non tender, non distended. Positive bowel sounds.  Genitourinary: No CVA tenderness. Musculoskeletal: Right BKA.  Otherwise nontender with normal range of motion in all extremities. No cyanosis, or erythema of extremities. Neurologic:  Face is symmetric. Moving all extremities. No gross focal neurologic deficits . Skin: Skin is warm, dry.  No rash or ulcers Psychiatric: Mood and affect are appropriate     Past Medical History:  Diagnosis Date   Anemia    Basal cell carcinoma    BPH (benign prostatic hyperplasia)    CAD (coronary artery disease)    a. s/p multiple prior PCIs b. s/p DES-distal RCA 05/2013   COPD (chronic obstructive pulmonary disease) (HCC)    GERD (gastroesophageal reflux disease)    Macular degeneration    Pure hypercholesterolemia    Stroke ([place])    Tobacco abuse    Traumatic amputation of leg(s) (complete) (partial), unilateral, level not specified, without mention of complication 52/77/8242   Right Below Knee Amputation   Unspecified essential hypertension     Past Surgical History:  Procedure Laterality Date   AMPUTATION Right 08/26/2011   Below Knee Amputee   APPENDECTOMY     CARDIAC CATHETERIZATION     CAROTID ANGIOGRAPHY Right 05/14/2016   Procedure: Carotid Angiography;  Surgeon: Katha Cabal, MD;  Location: [place] [facility];  Service: Cardiovascular;  Laterality: Right;   CAROTID PTA/STENT INTERVENTION  05/14/2016   Procedure: Carotid PTA/Stent Intervention;  Surgeon: Katha Cabal, MD;  Location: [place] [facility];  Service: Cardiovascular;;   CORONARY ANGIOPLASTY WITH  STENT PLACEMENT  09/28/2006   RCA   CORONARY ANGIOPLASTY WITH STENT PLACEMENT  07/21/2006   Mid LAD   CORONARY ANGIOPLASTY WITH STENT PLACEMENT  06/06/2013   30% prox LAD at the site of a prior stent, 40% mid LAD at the origin of D1, 40% ostial D1, 30% prox LCx at the site of a prior stent, in the proximal third of the vessel segment, 20% prox RCA, 99% distal RCA s/p DES. EF >55%.     reports that he has been smoking cigarettes. He has a 50.00 pack-year smoking history. He has never used smokeless tobacco. He reports that he does not drink alcohol and does not use drugs.  No Known Allergies  Family History  Problem Relation Age of Onset   Mental illness Mother       Prior to Admission medications   Medication Sig Start Date End Date Taking? Authorizing Provider  albuterol (PROVENTIL HFA;VENTOLIN HFA) 108 (90 Base) MCG/ACT inhaler Inhale 2 puffs into the lungs every 6 (six) hours as needed for wheezing or shortness of breath. 11/19/16 12/18/20  Cassell Smiles  Renee, NP  amLODipine (NORVASC) 10 MG tablet Take 5 mg by mouth daily.    [provider]  aspirin EC 81 MG tablet Take 1 tablet (81 mg total) by mouth daily. 12/01/18   Minna Merritts, MD  atorvastatin (LIPITOR) 40 MG tablet TAKE 1 TABLET BY MOUTH EVERY DAY 10/06/18   Parks Ranger, Devonne Doughty, DO  lisinopril (ZESTRIL) 30 MG tablet TAKE 1 TABLET BY MOUTH EVERY DAY 04/01/19   Karamalegos, Devonne Doughty, DO  metFORMIN (GLUCOPHAGE) 500 MG tablet Take 500 mg by mouth daily with breakfast.    [provider]  metoprolol tartrate (LOPRESSOR) 50 MG tablet Take 1 tablet (50 mg total) by mouth 2 (two) times daily. 04/03/20   Minna Merritts, MD  nitroGLYCERIN (NITROSTAT) 0.4 MG SL tablet Place 1 tablet (0.4 mg total) under the tongue every 5 (five) minutes as needed for chest pain. 10/13/17   Minna Merritts, MD  terazosin (HYTRIN) 1 MG capsule Take 1 capsule (1 mg total) by mouth at bedtime. 07/05/20   Minna Merritts, MD   [place] 200-62.5-25 MCG/INH AEPB INHALE 1 PUFF DAILY 12/04/20   [provider]      Labs on Admission: I have personally reviewed following labs and imaging studies  CBC: Recent Labs  Lab 02/22/21 2145  WBC 12.2*  NEUTROABS 10.4*  HGB 15.6  HCT 47.3  MCV 96.3  PLT 092   Basic Metabolic Panel: Recent Labs  Lab 02/22/21 2145  NA 134*  K 4.3  CL 95*  CO2 32  GLUCOSE 286*  BUN 20  CREATININE 0.94  CALCIUM 9.1   GFR: Estimated Creatinine Clearance: 47.4 mL/min (by C-G formula based on SCr of 0.94 mg/dL). Liver Function Tests: No results for input(s): AST, ALT, ALKPHOS, BILITOT, PROT, ALBUMIN in the last 168 hours. No results for input(s): LIPASE, AMYLASE in the last 168 hours. No results for input(s): AMMONIA in the last 168 hours. Coagulation Profile: No results for input(s): INR, PROTIME in the last 168 hours. Cardiac Enzymes: No results for input(s): CKTOTAL, CKMB, CKMBINDEX, TROPONINI in the last 168 hours. BNP (last 3 results) No results for input(s): PROBNP in the last 8760 hours. HbA1C: No results for input(s): HGBA1C in the last 72 hours. CBG: No results for input(s): GLUCAP in the last 168 hours. Lipid Profile: No results for input(s): CHOL, HDL, LDLCALC, TRIG, CHOLHDL, LDLDIRECT in the last 72 hours. Thyroid Function Tests: No results for input(s): TSH, T4TOTAL, FREET4, T3FREE, THYROIDAB in the last 72 hours. Anemia Panel: No results for input(s): VITAMINB12, FOLATE, FERRITIN, TIBC, IRON, RETICCTPCT in the last 72 hours. Urine analysis:    Component Value Date/Time   COLORURINE YELLOW (A) 03/30/2016 0052   APPEARANCEUR HAZY (A) 03/30/2016 0052   LABSPEC 1.010 03/30/2016 0052   PHURINE 6.0 03/30/2016 0052   GLUCOSEU 50 (A) 03/30/2016 0052   HGBUR SMALL (A) 03/30/2016 0052   BILIRUBINUR NEGATIVE 03/30/2016 0052   KETONESUR NEGATIVE 03/30/2016 0052   PROTEINUR 30 (A) 03/30/2016 0052   NITRITE POSITIVE (A) 03/30/2016 0052    LEUKOCYTESUR SMALL (A) 03/30/2016 0052    Radiological Exams on Admission: DG Chest Port 1 View  Result Date: 02/22/2021 CLINICAL DATA:  Shortness of breath. EXAM: PORTABLE CHEST 1 VIEW COMPARISON:  Chest radiograph dated 06/01/2013 FINDINGS: Diffuse interstitial prominence may be chronic or represent mild edema. Atypical pneumonia is not excluded clinical correlation is recommended. There is a 1 cm left upper lobe nodule versus focal edema. Follow-up recommended. Blunting of  the costophrenic angles may represent trace pleural effusions. No focal consolidation or pneumothorax. The cardiac silhouette is within normal limits with atherosclerotic calcification of the aorta. Coronary vascular calcification. No acute osseous pathology. Degenerative changes of the spine. IMPRESSION: 1. Diffuse interstitial prominence may be chronic or represent mild edema. Atypical pneumonia is not excluded. Follow-up recommended. 2. Left upper lobe nodule versus focal edema. 3. Probable trace bilateral pleural effusions. Electronically Signed   By: Anner Crete M.D.   On: 02/22/2021 22:10       Athena Masse MD [facility]   02/22/2021, 11:38 PM

## 2021-02-22 NOTE — ED Triage Notes (Addendum)
Pt presents to ED via [facility] from home with SOB. EMS reports pt was found in tripod positioning, 70% o2 Sat on room air, and RR 44-50. Pt was given 64m albuterol, 5 mcg duoneb, 2 grams mag sulfate, and 1282msolumedrol. Pt 96% on 4L [place] at this time

## 2021-02-22 NOTE — ED Notes (Signed)
Xray at bedside

## 2021-02-23 ENCOUNTER — Encounter: Payer: Self-pay | Admitting: Internal Medicine

## 2021-02-23 ENCOUNTER — Other Ambulatory Visit: Payer: Self-pay

## 2021-02-23 DIAGNOSIS — Z8673 Personal history of transient ischemic attack (TIA), and cerebral infarction without residual deficits: Secondary | ICD-10-CM

## 2021-02-23 DIAGNOSIS — J441 Chronic obstructive pulmonary disease with (acute) exacerbation: Secondary | ICD-10-CM

## 2021-02-23 DIAGNOSIS — L8990 Pressure ulcer of unspecified site, unspecified stage: Secondary | ICD-10-CM | POA: Insufficient documentation

## 2021-02-23 DIAGNOSIS — I10 Essential (primary) hypertension: Secondary | ICD-10-CM

## 2021-02-23 DIAGNOSIS — I2510 Atherosclerotic heart disease of native coronary artery without angina pectoris: Secondary | ICD-10-CM

## 2021-02-23 DIAGNOSIS — J9601 Acute respiratory failure with hypoxia: Secondary | ICD-10-CM

## 2021-02-23 LAB — GLUCOSE, CAPILLARY
Glucose-Capillary: 195 mg/dL — ABNORMAL HIGH (ref 70–99)
Glucose-Capillary: 268 mg/dL — ABNORMAL HIGH (ref 70–99)
Glucose-Capillary: 188 mg/dL — ABNORMAL HIGH (ref 70–99)
Glucose-Capillary: 175 mg/dL — ABNORMAL HIGH (ref 70–99)

## 2021-02-23 LAB — TROPONIN I (HIGH SENSITIVITY): Troponin I (High Sensitivity): 16 ng/L (ref <18)

## 2021-02-23 LAB — CBG MONITORING, ED: Glucose-Capillary: 252 mg/dL — ABNORMAL HIGH (ref 70–99)

## 2021-02-23 LAB — HIV ANTIBODY (ROUTINE TESTING W REFLEX): HIV Screen 4th Generation wRfx: NONREACTIVE

## 2021-02-23 MED ORDER — ATORVASTATIN CALCIUM 20 MG PO TABS
40.0000 mg | ORAL_TABLET | Freq: Every day | ORAL | Status: DC
  Administered 2021-02-24: 40 mg via ORAL
  Filled 2021-02-23: qty 2

## 2021-02-23 MED ORDER — NITROGLYCERIN 0.4 MG SL SUBL: 0.4000 mg | SUBLINGUAL_TABLET | SUBLINGUAL | Status: DC | PRN

## 2021-02-23 MED ORDER — INSULIN GLARGINE-YFGN 100 UNIT/ML ~~LOC~~ SOLN
10.0000 [IU] | Freq: Every day | SUBCUTANEOUS | Status: DC
  Administered 2021-02-24: 10 [IU] via SUBCUTANEOUS
  Filled 2021-02-23 (×3): qty 0.1

## 2021-02-23 MED ORDER — METOPROLOL TARTRATE 50 MG PO TABS
50.0000 mg | ORAL_TABLET | Freq: Two times a day (BID) | ORAL | Status: DC
  Administered 2021-02-23 – 2021-02-24 (×2): 50 mg via ORAL
  Filled 2021-02-23 (×2): qty 1

## 2021-02-23 MED ORDER — ASPIRIN EC 81 MG PO TBEC
81.0000 mg | DELAYED_RELEASE_TABLET | Freq: Every day | ORAL | Status: DC
  Administered 2021-02-24: 81 mg via ORAL
  Filled 2021-02-23: qty 1

## 2021-02-23 MED ORDER — TERAZOSIN HCL 1 MG PO CAPS
1.0000 mg | ORAL_CAPSULE | Freq: Every day | ORAL | Status: DC
  Administered 2021-02-23: 1 mg via ORAL
  Filled 2021-02-23 (×2): qty 1

## 2021-02-23 MED ORDER — METFORMIN HCL 500 MG PO TABS
500.0000 mg | ORAL_TABLET | Freq: Every day | ORAL | Status: DC
  Administered 2021-02-24: 500 mg via ORAL
  Filled 2021-02-23: qty 1

## 2021-02-23 NOTE — Evaluation (Signed)
Physical Therapy Evaluation Patient Details Name: Stanley Mclean MRN: [id number] DOB: 1940-03-22 Today's Date: 02/23/2021  History of Present Illness  Stanley Mclean is a 80 y.o. male with medical history significant for COPD, CAD with history of stent, DM, HTN, CVA, Right BKA, brought in by EMS with shortness of breath and respiratory distress which started the day prior but became acutely worse on the day of arrival.  He tried his home albuterol without relief.  He has a dry cough but denies fever or chills.  Denies chest pain.  Denies nausea, vomiting, diarrhea or abdominal pain.  On arrival of EMS, he was found to be tripoding with O2 sats 70% on room air and respiratory rate 45-50.  He was treated with DuoNeb, mag sulfate and Solu-Medrol in route and was placed on 4 L O2 with improvement in O2 sat to 96% by arrival  Clinical Impression    Patient tolerated session well and was agreeable to treatment. No pain reported throughout session. Son-in-law present throughout session. Patient reports being independent prior to hospitalization with ADLs and ambulation. Patient was able to ambulate around the nurses station w/ RW CGA-SBA with no LOB noted and O2 maintaining above 90 on 4L O2. Patient was able to demonstrate ambulating around his room w/o AD CGA on 4L O2, no LOB noted. Patient does required CGA for sit<>stands, however is Mod I with all bed mobility. Patient demonstrated decreased activity tolerance, and would benefit from skilled physical therapy throughout remainder of acute hospitalization to maximize safety and independence prior to returning home. No physical therapy recommendations following acute hospitalization d/c.   Recommendations for follow up therapy are one component of a multi-disciplinary discharge planning process, led by the attending physician.  Recommendations may be updated based on patient status, additional functional criteria and insurance authorization.  Follow Up  Recommendations No PT follow up    Assistance Recommended at Discharge PRN  Functional Status Assessment Patient has had a recent decline in their functional status and demonstrates the ability to make significant improvements in function in a reasonable and predictable amount of time.  Equipment Recommendations  None recommended by PT    Recommendations for Other Services       Precautions / Restrictions Precautions Precautions: Fall Restrictions Weight Bearing Restrictions: No      Mobility  Bed Mobility Overal bed mobility: Modified Independent                  Transfers Overall transfer level: Needs assistance Equipment used: Rolling walker (2 wheels) Transfers: Sit to/from Stand Sit to Stand: Min guard                Ambulation/Gait Ambulation/Gait assistance: Min guard Gait Distance (Feet):  (x250 ft, x25 feet) Assistive device: Rolling walker (2 wheels);None Gait Pattern/deviations: Ataxic;Step-through pattern Gait velocity: decreased gait speed, R prostetic donned     General Gait Details: x250 w/ RW CGA-SBA, x25 feet CGA no AD  Stairs            Wheelchair Mobility    Modified Rankin (Stroke Patients Only)       Balance Overall balance assessment: Needs assistance Sitting-balance support: Single extremity supported Sitting balance-Leahy Scale: Good Sitting balance - Comments: Patient able to reach out BOS in sitting with both hands (no UE support) w/ no LOB noted   Standing balance support: Bilateral upper extremity supported;No upper extremity supported;During functional activity Standing balance-Leahy Scale: Fair Standing balance comment: patient able to stand with  UUE support on walker to reach down and fit prostetic w/o LOB, able to ambulate without AD CGA                             Pertinent Vitals/Pain Pain Assessment: No/denies pain Pain Score: 0-No pain Pain Intervention(s): Monitored during  session;Repositioned;Limited activity within patient's tolerance    Home Living Family/patient expects to be discharged to:: Private residence Living Arrangements: Spouse/significant other Available Help at Discharge: Family Type of Home: House Home Access: Stairs to enter;Ramped entrance Entrance Stairs-Rails: Right;Left     Home Layout: One level Home Equipment: Wheelchair - Systems analyst - single point;Grab bars - tub/shower;Tub bench      Prior Function Prior Level of Function : Independent/Modified Independent             Mobility Comments: Pt reports he was independent with transfers, used his prothetic and no AD with ambulation.  He does have a wc to use when needed, ramped entry at home. ADLs Comments: Pt reports independence with self care at home     Hand Dominance   Dominant Hand: Right    Extremity/Trunk Assessment   Upper Extremity Assessment Upper Extremity Assessment: Defer to OT evaluation    Lower Extremity Assessment Lower Extremity Assessment: Overall WFL for tasks assessed;LLE deficits/detail LLE Coordination: decreased gross motor       Communication   Communication: No difficulties  Cognition Arousal/Alertness: Awake/alert Behavior During Therapy: WFL for tasks assessed/performed Overall Cognitive Status: Within Functional Limits for tasks assessed                                          General Comments      Exercises Other Exercises Other Exercises: x3 sit to stands from EOB Other Exercises: Patient able to demonstrate independence with donning and doffing prostesis without LOB   Assessment/Plan    PT Assessment Patient needs continued PT services (during hospital stay to work on energy conservation and balance during ambulation w/o AD)  PT Problem List Decreased activity tolerance;Decreased balance       PT Treatment Interventions Gait training;Balance training;Patient/family education    PT  Goals (Current goals can be found in the Care Plan section)  Acute Rehab PT Goals Patient Stated Goal: to go home PT Goal Formulation: With patient/family Time For Goal Achievement: 03/09/21 Potential to Achieve Goals: Good    Frequency 7X/week   Barriers to discharge        Co-evaluation               AM-PAC PT "6 Clicks" Mobility  Outcome Measure Help needed turning from your back to your side while in a flat bed without using bedrails?: None Help needed moving from lying on your back to sitting on the side of a flat bed without using bedrails?: None Help needed moving to and from a bed to a chair (including a wheelchair)?: A Little Help needed standing up from a chair using your arms (e.g., wheelchair or bedside chair)?: A Little Help needed to walk in hospital room?: A Little Help needed climbing 3-5 steps with a railing? : A Little 6 Click Score: 20    End of Session Equipment Utilized During Treatment: Gait belt Activity Tolerance: Patient tolerated treatment well;No increased pain Patient left: in bed;with call bell/phone within reach;with bed alarm set Nurse  Communication: Mobility status PT Visit Diagnosis: Other abnormalities of gait and mobility (R26.89)    Time: 2376-2831 PT Time Calculation (min) (ACUTE ONLY): 27 min   Charges:   PT Evaluation $PT Eval Low Complexity: 1 Low PT Treatments $Gait Training: 8-22 mins        Iva Boop, PT  02/23/21. 1:52 PM

## 2021-02-23 NOTE — Plan of Care (Signed)

## 2021-02-23 NOTE — Evaluation (Signed)
Occupational Therapy Evaluation Patient Details Name: Stanley Mclean MRN: [id number] DOB: [birth year] Today's Date: 02/23/2021   History of Present Illness Stanley Mclean is a 80 y.o. male with medical history significant for COPD, CAD with history of stent, DM, HTN, CVA, Right BKA, brought in by EMS with shortness of breath and respiratory distress which started the day prior but became acutely worse on the day of arrival.  He tried his home albuterol without relief.  He has a dry cough but denies fever or chills.  Denies chest pain.  Denies nausea, vomiting, diarrhea or abdominal pain.  On arrival of EMS, he was found to be tripoding with O2 sats 70% on room air and respiratory rate 45-50.  He was treated with DuoNeb, mag sulfate and Solu-Medrol in route and was placed on 4 L O2 with improvement in O2 sat to 96% by arrival   Clinical Impression   Pt seen for OT evaluation this date, son in law present during session.  Pt lives with his wife in a one story home with a ramped entry.  Pt was independent prior to admission for basic self care tasks, reports he ambulated with prosthetic without any AD, he does have a wheelchair and walker.  Pt denies having O2 at home but is on 4L in the hospital setting.  He presents with muscle weakness, decreased activity tolerance, shortness of breath without oxygen and would benefit from skilled OT services to maximize safety and independence in necessary daily tasks to return home.      Recommendations for follow up therapy are one component of a multi-disciplinary discharge planning process, led by the attending physician.  Recommendations may be updated based on patient status, additional functional criteria and insurance authorization.   Follow Up Recommendations  No OT follow up    Assistance Recommended at Discharge Intermittent Supervision/Assistance  Functional Status Assessment  Patient has had a recent decline in their functional status and  demonstrates the ability to make significant improvements in function in a reasonable and predictable amount of time.  Equipment Recommendations       Recommendations for Other Services       Precautions / Restrictions Precautions Precautions: Fall Restrictions Weight Bearing Restrictions: No      Mobility Bed Mobility Overal bed mobility: Modified Independent                  Transfers Overall transfer level: Needs assistance Equipment used: Rolling walker (2 wheels) Transfers: Sit to/from Stand Sit to Stand: Min guard                  Balance Overall balance assessment: Needs assistance Sitting-balance support: Single extremity supported Sitting balance-Leahy Scale: Good     Standing balance support: Bilateral upper extremity supported Standing balance-Leahy Scale: Fair                             ADL either performed or assessed with clinical judgement   ADL Overall ADL's : Needs assistance/impaired Eating/Feeding: Modified independent   Grooming: Modified independent   Upper Body Bathing: Modified independent   Lower Body Bathing: Set up   Upper Body Dressing : Modified independent   Lower Body Dressing: Set up   Toilet Transfer: Min guard   Toileting- Clothing Manipulation and Hygiene: Min guard       Functional mobility during ADLs: Min guard;Rolling walker (2 wheels) General ADL Comments: Pt still able to complete  his self care tasks but reports increased weakness when performing, he would likely benefit from energy conservation techniques.  He was able to perform sit to stand from bed with use of walker and did not don his prosthetic.  Pt with some acid reflux today and preferred to sit edge of bed.  PT in at the end of session.     Vision Baseline Vision/History: 1 Wears glasses       Perception     Praxis      Pertinent Vitals/Pain Pain Assessment: No/denies pain     Hand Dominance Right   Extremity/Trunk  Assessment Upper Extremity Assessment Upper Extremity Assessment: Generalized weakness   Lower Extremity Assessment Lower Extremity Assessment: Defer to PT evaluation       Communication Communication Communication: No difficulties   Cognition Arousal/Alertness: Awake/alert Behavior During Therapy: WFL for tasks assessed/performed Overall Cognitive Status: Within Functional Limits for tasks assessed                                       General Comments       Exercises     Shoulder Instructions      Home Living Family/patient expects to be discharged to:: Private residence Living Arrangements: Spouse/significant other Available Help at Discharge: Family Type of Home: House Home Access: Stairs to enter;Ramped entrance   Entrance Stairs-Rails: Right;Left Home Layout: One level     Bathroom Shower/Tub: Corporate investment banker: Standard Bathroom Accessibility: Yes   Home Equipment: Wheelchair - Systems analyst - single point;Grab bars - tub/shower;Tub bench          Prior Functioning/Environment Prior Level of Function : Independent/Modified Independent             Mobility Comments: Pt reports he was independent with transfers, used his prosthetic and no AD with ambulation.  He does have a wc to use when needed, ramped entry at home. ADLs Comments: Pt reports independence with self care at home        OT Problem List: Decreased strength;Decreased knowledge of use of DME or AE;Decreased activity tolerance      OT Treatment/Interventions: Self-care/ADL training;Therapeutic exercise;Patient/family education;Energy conservation;Therapeutic activities;DME and/or AE instruction    OT Goals(Current goals can be found in the care plan section) Acute Rehab OT Goals Patient Stated Goal: I would like to go home today OT Goal Formulation: With patient/family Time For Goal Achievement: 03/09/21 Potential to Achieve  Goals: Good ADL Goals Pt Will Perform Lower Body Dressing: with modified independence Pt Will Transfer to Toilet: with modified independence Pt/caregiver will Perform Home Exercise Program: Increased strength  OT Frequency: Min 2X/week   Barriers to D/C:            Co-evaluation              AM-PAC OT "6 Clicks" Daily Activity     Outcome Measure Help from another person eating meals?: None Help from another person taking care of personal grooming?: None Help from another person toileting, which includes using toliet, bedpan, or urinal?: A Little Help from another person bathing (including washing, rinsing, drying)?: A Little Help from another person to put on and taking off regular upper body clothing?: None Help from another person to put on and taking off regular lower body clothing?: A Little 6 Click Score: 21   End of Session Equipment Utilized During Treatment: Gait belt;Rolling walker (  2 wheels)  Activity Tolerance: Patient tolerated treatment well Patient left: in bed;with call bell/phone within reach;with family/visitor present;Other (comment) (PT in to see patient at the end of OT eval)  OT Visit Diagnosis: Unsteadiness on feet (R26.81);Muscle weakness (generalized) (M62.81)                Time: 2330-0762 OT Time Calculation (min): 31 min Charges:  OT General Charges $OT Visit: 1 Visit OT Evaluation $OT Eval Low Complexity: 1 Low OT Treatments $Self Care/Home Management : 8-22 mins  Jaskirat Zertuche T Neyra Pettie, OTR/L, CLT   Elyssia Strausser 02/23/2021, 1:25 PM

## 2021-02-23 NOTE — Progress Notes (Signed)
PROGRESS NOTE  Stanley Mclean    DOB: 08-29-1940, 80 y.o.  HGD:[id number]  PCP: Olin Hauser, DO   Code Status: Full Code   DOA: 02/22/2021   LOS: 1  Brief Narrative of Current Hospitalization  Stanley Mclean is a 80 y.o. male with a PMH significant for COPD, CAD, type II DM, HTN, CVA, right BKA, tobacco use. They presented from home to the ED on 02/22/2021 with shortness of breath x2 days. In the ED, it was found that they had COPD exacerbation. They were treated with supplemental oxygen, breathing treatments, steroids, antibiotics.  Patient was admitted to medicine service for further workup and management of COPD exacerbation as outlined in detail below.  02/23/21 -stable, improved  Assessment & Plan  Principal Problem:   Atypical pneumonia Active Problems:   CAD (coronary artery disease)   S/P coronary artery stent placement   Essential hypertension   History of CVA (cerebrovascular accident)   COPD with acute bronchitis (HCC)   Acute respiratory failure with hypoxia ([place])  Acute hypoxic respiratory failure  COPD  tobacco use- no baseline oxygen use. Currently stable on 4L - wean to room air as tolerated - continue breathing treatments - continue steroids - continue antibiotics - counseled patient on tobacco cessation. Nicotine supplementation PRN  CAD  HTN- chronic, well controlled, asymptomatic-  - holding home meds for low diastolics - continue BB  H/o CVA- minimal residual - continue statin  Type II DM- glucoses elevated in setting of steroid use. A1c pending.  - sSSI - 10units semglee daily - continue metformin   DVT prophylaxis: enoxaparin (LOVENOX) injection 40 mg Start: 02/23/21 0000   Diet:  Diet Orders (From admission, onward)     Start     Ordered   02/22/21 2346  Diet heart healthy/carb modified Room service appropriate? Yes; Fluid consistency: Thin  Diet effective now       Question Answer Comment  Diet-HS Snack? Nothing   Room  service appropriate? Yes   Fluid consistency: Thin      02/22/21 2347            Subjective 02/23/21    Pt reports feeling improved. Denies chest pain.   Disposition Plan & Communication  Patient status: Inpatient  Admitted From: Home Disposition: Home Anticipated discharge date: 12/12  Family Communication: none  Consults, Procedures, Significant Events  Consultants:  none  Procedures/significant events:  None  Antimicrobials:  Anti-infectives (From admission, onward)    Start     Dose/Rate Route Frequency Ordered Stop   02/23/21 0000  cefTRIAXone (ROCEPHIN) 2 g in sodium chloride 0.9 % 100 mL IVPB        2 g 200 mL/hr over 30 Minutes Intravenous Every 24 hours 02/22/21 2347 02/27/21 2359   02/23/21 0000  azithromycin (ZITHROMAX) 500 mg in sodium chloride 0.9 % 250 mL IVPB        500 mg 250 mL/hr over 60 Minutes Intravenous Every 24 hours 02/22/21 2347 02/27/21 2359   02/22/21 2300  azithromycin (ZITHROMAX) 500 mg in sodium chloride 0.9 % 250 mL IVPB  Status:  Discontinued        500 mg 250 mL/hr over 60 Minutes Intravenous  Once 02/22/21 2255 02/23/21 0027   02/22/21 2300  cefTRIAXone (ROCEPHIN) 1 g in sodium chloride 0.9 % 100 mL IVPB  Status:  Discontinued        1 g 200 mL/hr over 30 Minutes Intravenous  Once 02/22/21 2255 02/23/21 0027  Objective   Vitals:   02/23/21 0230 02/23/21 0407 02/23/21 0409 02/23/21 0500  BP: (!) 112/52 (!) 105/47 (!) 109/57 (!) 101/58  Pulse: 78 68 69 65  Resp: _0 Temp:   (!) 97.5 F (36.4 C) 98 F (36.7 C)  TempSrc:    Oral  SpO2: 96% 99% 99% 98%  Weight:      Height:       No intake or output data in the 24 hours ending 02/23/21 0606 Filed Weights   02/22/21 2150  Weight: 51.7 kg    Patient BMI: Body mass index is 16.83 kg/m.   Physical Exam:  General: awake, alert, NAD Respiratory: normal respiratory effort. Cardiovascular: normal S1/S2, RRR, no JVD, murmurs, quick capillary refill  Nervous:  A&O x3. no gross focal neurologic deficits, normal speech Extremities: moves all equally, no edema, normal tone. R BKA Skin: dry, intact, normal temperature, normal color. No rashes, lesions or ulcers on exposed skin Psychiatry: normal mood, congruent affect  Labs   I have personally reviewed following labs and imaging studies Admission on 02/22/2021  Component Date Value Ref Range Status   SARS Coronavirus 2 by RT PCR 02/22/2021 NEGATIVE  NEGATIVE Final   Influenza A by PCR 02/22/2021 NEGATIVE  NEGATIVE Final   Influenza B by PCR 02/22/2021 NEGATIVE  NEGATIVE Final   Sodium 02/22/2021 134 (L)  135 - 145 mmol/L Final   Potassium 02/22/2021 4.3  3.5 - 5.1 mmol/L Final   Chloride 02/22/2021 95 (L)  98 - 111 mmol/L Final   CO2 02/22/2021 32  22 - 32 mmol/L Final   Glucose, Bld 02/22/2021 286 (H)  70 - 99 mg/dL Final   BUN 02/22/2021 20  8 - 23 mg/dL Final   Creatinine, Ser 02/22/2021 0.94  0.61 - 1.24 mg/dL Final   Calcium 02/22/2021 9.1  8.9 - 10.3 mg/dL Final   GFR, Estimated 02/22/2021 >60  >60 mL/min Final   Anion gap 02/22/2021 7  5 - 15 Final   WBC 02/22/2021 12.2 (H)  4.0 - 10.5 K/uL Final   RBC 02/22/2021 4.91  4.22 - 5.81 MIL/uL Final   Hemoglobin 02/22/2021 15.6  13.0 - 17.0 g/dL Final   HCT 02/22/2021 47.3  39.0 - 52.0 % Final   MCV 02/22/2021 96.3  80.0 - 100.0 fL Final   MCH 02/22/2021 31.8  26.0 - 34.0 pg Final   MCHC 02/22/2021 33.0  30.0 - 36.0 g/dL Final   RDW 02/22/2021 13.2  11.5 - 15.5 % Final   Platelets 02/22/2021 210  150 - 400 K/uL Final   nRBC 02/22/2021 0.0  0.0 - 0.2 % Final   Neutrophils Relative % 02/22/2021 86  % Final   Neutro Abs 02/22/2021 10.4 (H)  1.7 - 7.7 K/uL Final   Lymphocytes Relative 02/22/2021 7  % Final   Lymphs Abs 02/22/2021 0.9  0.7 - 4.0 K/uL Final   Monocytes Relative 02/22/2021 7  % Final   Monocytes Absolute 02/22/2021 0.9  0.1 - 1.0 K/uL Final   Eosinophils Relative 02/22/2021 0  % Final   Eosinophils Absolute 02/22/2021 0.0  0.0  - 0.5 K/uL Final   Basophils Relative 02/22/2021 0  % Final   Basophils Absolute 02/22/2021 0.0  0.0 - 0.1 K/uL Final   Immature Granulocytes 02/22/2021 0  % Final   Abs Immature Granulocytes 02/22/2021 0.04  0.00 - 0.07 K/uL Final   Troponin I (High Sensitivity) 02/22/2021 13  <18 ng/L Final  Troponin I (High Sensitivity) 02/23/2021 16  <18 ng/L Final   Glucose-Capillary 02/23/2021 252 (H)  70 - 99 mg/dL Final    Imaging Studies  DG Chest Port 1 View  Result Date: 02/22/2021 CLINICAL DATA:  Shortness of breath. EXAM: PORTABLE CHEST 1 VIEW COMPARISON:  Chest radiograph dated 06/01/2013 FINDINGS: Diffuse interstitial prominence may be chronic or represent mild edema. Atypical pneumonia is not excluded clinical correlation is recommended. There is a 1 cm left upper lobe nodule versus focal edema. Follow-up recommended. Blunting of the costophrenic angles may represent trace pleural effusions. No focal consolidation or pneumothorax. The cardiac silhouette is within normal limits with atherosclerotic calcification of the aorta. Coronary vascular calcification. No acute osseous pathology. Degenerative changes of the spine. IMPRESSION: 1. Diffuse interstitial prominence may be chronic or represent mild edema. Atypical pneumonia is not excluded. Follow-up recommended. 2. Left upper lobe nodule versus focal edema. 3. Probable trace bilateral pleural effusions. Electronically Signed   By: Anner Crete M.D.   On: 02/22/2021 22:10    Medications   Scheduled Meds:  enoxaparin (LOVENOX) injection  40 mg Subcutaneous Q24H   insulin aspart  0-15 Units Subcutaneous TID WC   insulin aspart  0-5 Units Subcutaneous QHS   ipratropium-albuterol  3 mL Nebulization Q6H   methylPREDNISolone (SOLU-MEDROL) injection  40 mg Intravenous Q12H   Followed by   Derrill Memo ON 02/24/2021] predniSONE  40 mg Oral Q breakfast   No recently discontinued medications to reconcile  LOS: 1 day   Time spent: >1mn  Sage Hammill L  Charice Zuno, DO [facility] 02/23/2021, 6:06 AM   Available by Epic secure chat 7AM-7PM. If 7PM-7AM, please contact night-coverage Refer to [email or web address] to contact the [facility] or Consulting provider for this pt

## 2021-02-24 LAB — GLUCOSE, CAPILLARY
Glucose-Capillary: 98 mg/dL (ref 70–99)
Glucose-Capillary: 146 mg/dL — ABNORMAL HIGH (ref 70–99)

## 2021-02-24 MED ORDER — FUROSEMIDE 10 MG/ML IJ SOLN
40.0000 mg | Freq: Once | INTRAMUSCULAR | Status: AC
Start: 2021-02-24 — End: 2021-02-24
  Administered 2021-02-24: 40 mg via INTRAVENOUS
  Filled 2021-02-24: qty 4

## 2021-02-24 MED ORDER — AZITHROMYCIN 500 MG PO TABS: 500.0000 mg | ORAL_TABLET | Freq: Every day | ORAL | Status: DC

## 2021-02-24 MED ORDER — PROSOURCE PLUS PO LIQD
30.0000 mL | Freq: Two times a day (BID) | ORAL | Status: DC
  Filled 2021-02-24: qty 30

## 2021-02-24 MED ORDER — AZITHROMYCIN 500 MG PO TABS: 500.0000 mg | ORAL_TABLET | Freq: Every day | ORAL | 0 refills | Status: AC

## 2021-02-24 MED ORDER — ADULT MULTIVITAMIN W/MINERALS CH
1.0000 | ORAL_TABLET | Freq: Every day | ORAL | Status: DC
Start: 2021-02-24 — End: 2021-02-25
  Administered 2021-02-24: 1 via ORAL
  Filled 2021-02-24: qty 1

## 2021-02-24 MED ORDER — ENSURE ENLIVE PO LIQD
237.0000 mL | Freq: Two times a day (BID) | ORAL | Status: DC
  Administered 2021-02-24: 237 mL via ORAL

## 2021-02-24 MED ORDER — PREDNISONE 20 MG PO TABS: 40.0000 mg | ORAL_TABLET | Freq: Every day | ORAL | 0 refills | Status: AC

## 2021-02-24 NOTE — Progress Notes (Signed)
Initial Nutrition Assessment RD working remotely.  DOCUMENTATION CODES:   Underweight  INTERVENTION:  - will order Ensure Enlive BID, each supplement provides 350 kcal and 20 grams of protein. - will order 30 ml Prosource Plus BID, each supplement provides 100 kcal and 15 grams protein.  - will order 1 tablet multivitamin with minerals/day. - weigh today. - complete NFPE when feasible.   NUTRITION DIAGNOSIS:   Increased nutrient needs related to acute illness, chronic illness as evidenced by estimated needs.  GOAL:   Patient will meet greater than or equal to 90% of their needs  MONITOR:   PO intake, Supplement acceptance, Labs, Weight trends  REASON FOR ASSESSMENT:   Malnutrition Screening Tool, Consult Assessment of nutrition requirement/status  ASSESSMENT:   80 y.o. male with medical history of COPD, CAD, type 2 DM, HTN, CVA, R BKA, and tobacco use. He presented to the ED from home on 12/9 due to shortness of breath x2 days. He was admitted for COPD exacerbation.  Unable to reach by phone. No meal intakes documented since admission. If meal intakes have been poor, recommend diet liberalization. He has not been seen by a [place] RD in the past.   Weight on 12/9 was 114 lb which is exactly the same as weight recorded on 12/18/20. It appears weight was copied forward. Prior to that, the most recently documented weight was 140 lb on 12/14/19. No information documented in the edema section of flow sheet this hospitalization.    Labs reviewed; CBGs: 98 and 146 mg/dl, Na: 134 mmol/l, Cl: 95 mmol/l.   Medications reviewed; 40 mg IV lasix x1 dose 12/11, sliding scale novolog, 10 units semglee/day, 500 mg glucophage/day, 40 mg deltasone/day.    NUTRITION - FOCUSED PHYSICAL EXAM:  RD working remotely.   Diet Order:   Diet Order             Diet heart healthy/carb modified Room service appropriate? Yes; Fluid consistency: Thin  Diet effective now                    EDUCATION NEEDS:   Not appropriate for education at this time  Skin:  Skin Assessment: Skin Integrity Issues: Skin Integrity Issues:: Stage I Stage I: coccyx  Last BM:  12/10  Height:   Ht Readings from Last 1 Encounters:  02/22/21 _0  (1.753 m)    Weight:   Wt Readings from Last 1 Encounters:  02/22/21 51.7 kg     Estimated Nutritional Needs:  Kcal:  1800-2000 kcal Protein:  90-100 grams Fluid:  >/= 1.8 L/day      Jarome Matin, MS, RD, LDN, CNSC Inpatient Clinical Dietitian RD pager # available in [facility]  After hours/weekend pager # available in [facility]

## 2021-02-24 NOTE — Plan of Care (Signed)
  Problem: Health Behavior/Discharge Planning: Goal: Ability to manage health-related needs will improve Outcome: Progressing   Problem: Coping: Goal: Level of anxiety will decrease Outcome: Progressing   Problem: Pain Managment: Goal: General experience of comfort will improve Outcome: Progressing

## 2021-02-24 NOTE — Discharge Summary (Signed)
Physician Discharge Summary  NORBERT MALKIN ZHY:[id number] DOB: 08-18-1940 DOA: 02/22/2021  PCP: Olin Hauser, DO  Admit date: 02/22/2021 Discharge date: 02/24/2021  Admitted From: Home Disposition: Home  Recommendations for Outpatient Follow-up:  Follow up with PCP within 1-2 weeks to monitor oxygen requirement. Discharged on 2L. Continue counseling for smoking cessation.  Follow up with pulmonology  none Equipment/Devices:oxygen 2L  Discharge Condition:stable, improved CODE STATUS:  Code Status: Full Code  Regular healthy diet  Brief/Interim Summary: Pt was admitted for acute worsening of chronic respiratory condition. He had hypoxia requiring 4Lnc which was modestly responsive to duonebs. His chest xray was non-specific and showed pattern consistent with atypical pneumonia with edema, trace effusion and left upper lobe nodule vs focal edema. His overall clinical status was more consistent with hypovolemia. He was treated with supplemental oxygen which was weaned down to 2 L, breathing treatments, steroid course, and antibiotics. Patient participated with PT and described no DOE but continued to have desaturations. We had several conversations about smoking cessation throughout his stay. Patient would have benefited from additional day of observation as he was gradually improving but he felt very strongly that he wanted to go home. 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 His medications were optimized and he was provided with supplemental oxygen condensing unit and home health delivery in order to support his recovery. He will need close follow up. Ambulatory referral to pulmonology was placed at time of discharge.   Discharge Diagnoses:  Principal Problem:   Atypical pneumonia Active Problems:   CAD (coronary artery disease)   S/P coronary artery stent placement   Essential hypertension   History of  CVA (cerebrovascular accident)   COPD with acute exacerbation (HCC)   Acute respiratory failure with hypoxia ([place])   Pressure injury of skin   Discharge Instructions     Ambulatory referral to Pulmonology   Complete by: As directed    Reason for referral: Asthma/COPD      Allergies as of 02/24/2021   No Known Allergies      Medication List     STOP taking these medications    amLODipine 10 MG tablet Commonly known as: NORVASC   lisinopril 30 MG tablet Commonly known as: ZESTRIL       TAKE these medications    albuterol 108 (90 Base) MCG/ACT inhaler Commonly known as: VENTOLIN HFA Inhale 2 puffs into the lungs every 6 (six) hours as needed for wheezing or shortness of breath.   aspirin EC 81 MG tablet Take 1 tablet (81 mg total) by mouth daily.   atorvastatin 40 MG tablet Commonly known as: LIPITOR TAKE 1 TABLET BY MOUTH EVERY DAY   azithromycin 500 MG tablet Commonly known as: ZITHROMAX Take 1 tablet (500 mg total) by mouth daily for 3 days. Start taking on: February 25, 2021   metFORMIN 500 MG tablet Commonly known as: GLUCOPHAGE Take 500 mg by mouth daily with breakfast.   metoprolol tartrate 50 MG tablet Commonly known as: LOPRESSOR Take 1 tablet (50 mg total) by mouth 2 (two) times daily.   nitroGLYCERIN 0.4 MG SL tablet Commonly known as: NITROSTAT Place 1 tablet (0.4 mg total) under the tongue every 5 (five) minutes as needed for chest pain.   predniSONE 20 MG tablet Commonly known as: DELTASONE Take 2 tablets (40 mg total) by mouth daily with breakfast for 3 days. Start taking on: February 25, 2021  terazosin 1 MG capsule Commonly known as: HYTRIN Take 1 capsule (1 mg total) by mouth at bedtime.   Trelegy Ellipta 200-62.5-25 MCG/ACT Aepb Generic drug: Fluticasone-Umeclidin-Vilant INHALE 1 PUFF DAILY               Durable Medical Equipment  (From admission, onward)           Start     Ordered   02/24/21 1520  For home  use only DME oxygen  Once       Question Answer Comment  Length of Need 6 Months   Mode or (Route) Nasal cannula   Liters per Minute 2   Frequency Continuous (stationary and portable oxygen unit needed)   Oxygen conserving device Yes   Oxygen delivery system Gas      02/24/21 1520            No Known Allergies  Consultations: None   Procedures/Studies: DG Chest Port 1 View  Result Date: 02/22/2021 CLINICAL DATA:  Shortness of breath. EXAM: PORTABLE CHEST 1 VIEW COMPARISON:  Chest radiograph dated 06/01/2013 FINDINGS: Diffuse interstitial prominence may be chronic or represent mild edema. Atypical pneumonia is not excluded clinical correlation is recommended. There is a 1 cm left upper lobe nodule versus focal edema. Follow-up recommended. Blunting of the costophrenic angles may represent trace pleural effusions. No focal consolidation or pneumothorax. The cardiac silhouette is within normal limits with atherosclerotic calcification of the aorta. Coronary vascular calcification. No acute osseous pathology. Degenerative changes of the spine. IMPRESSION: 1. Diffuse interstitial prominence may be chronic or represent mild edema. Atypical pneumonia is not excluded. Follow-up recommended. 2. Left upper lobe nodule versus focal edema. 3. Probable trace bilateral pleural effusions. Electronically Signed   By: Anner Crete M.D.   On: 02/22/2021 22:10    Subjective: Patient has no symptoms of dyspnea at rest or with exertion. Denies chest pain or leg swelling. He desperately wants to go home and states he will go home today no matter what as he is the sole caregiver for his ill wife. He endorses understanding that he needs to wear oxygen still and take all his medications and follow up very closely with his PCP.   Discharge Exam: Vitals:   02/24/21 1405 02/24/21 1456  BP:  (!) 158/77  Pulse:  64  Resp:    Temp:  98.6 F (37 C)  SpO2: 93% 93%    General: Pt is alert, awake, not in  acute distress Cardiovascular: RRR, S1/S2 +, no rubs, no gallops Respiratory: CTA bilaterally, no wheezing, no rhonchi Abdominal: Soft, NT, ND, bowel sounds + Extremities: no edema, no cyanosis  Labs: Basic Metabolic Panel: Recent Labs  Lab 02/22/21 2145  NA 134*  K 4.3  CL 95*  CO2 32  GLUCOSE 286*  BUN 20  CREATININE 0.94  CALCIUM 9.1   CBC: Recent Labs  Lab 02/22/21 2145  WBC 12.2*  NEUTROABS 10.4*  HGB 15.6  HCT 47.3  MCV 96.3  PLT 210    Microbiology Recent Results (from the past 240 hour(s))  Resp Panel by RT-PCR (Flu A&B, Covid) Nasopharyngeal Swab     Status: None   Collection Time: 02/22/21  9:46 PM   Specimen: Nasopharyngeal Swab; Nasopharyngeal(NP) swabs in vial transport medium  Result Value Ref Range Status   SARS Coronavirus 2 by RT PCR NEGATIVE NEGATIVE Final    Comment: (NOTE) SARS-CoV-2 target nucleic acids are NOT DETECTED.  The SARS-CoV-2 RNA is generally detectable in upper respiratory specimens  during the acute phase of infection. The lowest concentration of SARS-CoV-2 viral copies this assay can detect is 138 copies/mL. A negative result does not preclude SARS-Cov-2 infection and should not be used as the sole basis for treatment or other patient management decisions. A negative result may occur with  improper specimen collection/handling, submission of specimen other than nasopharyngeal swab, presence of viral mutation(s) within the areas targeted by this assay, and inadequate number of viral copies(<138 copies/mL). A negative result must be combined with clinical observations, patient history, and epidemiological information. The expected result is Negative.  Fact Sheet for Patients:  EntrepreneurPulse.com.au  Fact Sheet for Healthcare Providers:  IncredibleEmployment.be  This test is no t yet approved or cleared by the [place] FDA and  has been authorized for detection and/or diagnosis of  SARS-CoV-2 by FDA under an Emergency Use Authorization (EUA). This EUA will remain  in effect (meaning this test can be used) for the duration of the COVID-19 declaration under Section 564(b)(1) of the Act, 21 U.S.C.section 360bbb-3(b)(1), unless the authorization is terminated  or revoked sooner.       Influenza A by PCR NEGATIVE NEGATIVE Final   Influenza B by PCR NEGATIVE NEGATIVE Final    Comment: (NOTE) The Xpert Xpress SARS-CoV-2/FLU/RSV plus assay is intended as an aid in the diagnosis of influenza from Nasopharyngeal swab specimens and should not be used as a sole basis for treatment. Nasal washings and aspirates are unacceptable for Xpert Xpress SARS-CoV-2/FLU/RSV testing.  Fact Sheet for Patients: EntrepreneurPulse.com.au  Fact Sheet for Healthcare Providers: IncredibleEmployment.be  This test is not yet approved or cleared by the [place] FDA and has been authorized for detection and/or diagnosis of SARS-CoV-2 by FDA under an Emergency Use Authorization (EUA). This EUA will remain in effect (meaning this test can be used) for the duration of the COVID-19 declaration under Section 564(b)(1) of the Act, 21 U.S.C. section 360bbb-3(b)(1), unless the authorization is terminated or revoked.  Performed at [facility], [address] [place], [place] [address]   Culture, blood (routine x 2) Call MD if unable to obtain prior to antibiotics being given     Status: None (Preliminary result)   Collection Time: 02/23/21 12:16 AM   Specimen: BLOOD  Result Value Ref Range Status   Specimen Description BLOOD RIGHT ANTECUBITAL  Final   Special Requests   Final    BOTTLES DRAWN AEROBIC AND ANAEROBIC Blood Culture adequate volume   Culture   Final    NO GROWTH 1 DAY Performed at [facility], [address], [place], [place] [address]    Report Status PENDING  Incomplete  Culture, blood (routine x 2) Call MD if  unable to obtain prior to antibiotics being given     Status: None (Preliminary result)   Collection Time: 02/23/21 12:17 AM   Specimen: BLOOD  Result Value Ref Range Status   Specimen Description BLOOD BLOOD LEFT FOREARM  Final   Special Requests   Final    BOTTLES DRAWN AEROBIC AND ANAEROBIC Blood Culture adequate volume   Culture   Final    NO GROWTH 1 DAY Performed at [facility] [address] [place], [place] [address]    Report Status PENDING  Incomplete    Time coordinating discharge: Over 30 minutes  Richarda Osmond, MD  [facility] 02/24/2021, 5:19 PM

## 2021-02-24 NOTE — TOC Transition Note (Signed)
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 [employer] will deliver portable condenser to hospital room and complete home set up early this week via [facility] at [place]. Oxygen DME order is in for 2L/[place]. No PT/OT rec/needs. Updated provider. Will be delivered today. Simmie Davies RN CM 326 pm.     Final next level of care: [place] Barriers to Discharge: Barriers Resolved   Patient Goals and CMS Choice        Discharge Placement                       Discharge Plan and Services                DME Arranged: Oxygen   Date DME Agency Contacted: 02/24/21 Time DME Agency Contacted: [phone] Representative spoke with at DME Agency: [place] at [place]: [place]: NA        Social Determinants of Health ([place]) Interventions     Readmission Risk Interventions No flowsheet data found.

## 2021-02-24 NOTE — Progress Notes (Signed)
Physical Therapy Treatment Patient Details Name: Stanley Mclean MRN: [id number] DOB: 27-Aug-1940 Today's Date: 02/24/2021   History of Present Illness Stanley Mclean is a 80 y.o. male with medical history significant for COPD, CAD with history of stent, DM, HTN, CVA, Right BKA, brought in by EMS with shortness of breath and respiratory distress which started the day prior but became acutely worse on the day of arrival.  He tried his home albuterol without relief.  He has a dry cough but denies fever or chills.  Denies chest pain.  Denies nausea, vomiting, diarrhea or abdominal pain.  On arrival of EMS, he was found to be tripoding with O2 sats 70% on room air and respiratory rate 45-50.  He was treated with DuoNeb, mag sulfate and Solu-Medrol in route and was placed on 4 L O2 with improvement in O2 sat to 96% by arrival    PT Comments    OOB with ease and dons prosthesis on his own.  Is able to stand and walk x 1 lap on 2 lpm with steady gait.  Stats checked 82% with 2 lpm support.  Increased to 4 lpm to return to room and take a seated rest.  Sats increase to 92%.  He is able to complete another lap on 4 lpm and sats remain 92%.  He is returned to 2 lpm at rest and remains 92%.    Pt is anxious to return home to wife.  Discussed O2 with RN and MD via secure chat.   Recommendations for follow up therapy are one component of a multi-disciplinary discharge planning process, led by the attending physician.  Recommendations may be updated based on patient status, additional functional criteria and insurance authorization.  Follow Up Recommendations  No PT follow up     Assistance Recommended at Discharge PRN  Equipment Recommendations  None recommended by PT    Recommendations for Other Services       Precautions / Restrictions Precautions Precautions: Fall Restrictions Weight Bearing Restrictions: No     Mobility  Bed Mobility Overal bed mobility: Modified Independent                   Transfers Overall transfer level: Needs assistance Equipment used: Rolling walker (2 wheels) Transfers: Sit to/from Stand Sit to Stand: Min guard;Supervision                Ambulation/Gait Ambulation/Gait assistance: Min guard Gait Distance (Feet): 180 Feet Assistive device: Rolling walker (2 wheels) Gait Pattern/deviations: Ataxic;Step-through pattern Gait velocity: decreased gait speed, R prostetic donned     General Gait Details: 180 x 2 with seated rest break for O2 recover.   Stairs             Wheelchair Mobility    Modified Rankin (Stroke Patients Only)       Balance Overall balance assessment: Needs assistance Sitting-balance support: Single extremity supported Sitting balance-Leahy Scale: Good     Standing balance support: Bilateral upper extremity supported;No upper extremity supported;During functional activity Standing balance-Leahy Scale: Fair                              Cognition Arousal/Alertness: Awake/alert Behavior During Therapy: WFL for tasks assessed/performed Overall Cognitive Status: Within Functional Limits for tasks assessed  Exercises      General Comments        Pertinent Vitals/Pain Pain Assessment: No/denies pain    Home Living                          Prior Function            PT Goals (current goals can now be found in the care plan section) Progress towards PT goals: Progressing toward goals    Frequency    7X/week      PT Plan      Co-evaluation              AM-PAC PT "6 Clicks" Mobility   Outcome Measure  Help needed turning from your back to your side while in a flat bed without using bedrails?: None Help needed moving from lying on your back to sitting on the side of a flat bed without using bedrails?: None Help needed moving to and from a bed to a chair (including a wheelchair)?: A  Little Help needed standing up from a chair using your arms (e.g., wheelchair or bedside chair)?: A Little Help needed to walk in hospital room?: A Little Help needed climbing 3-5 steps with a railing? : A Little 6 Click Score: 20    End of Session Equipment Utilized During Treatment: Gait belt Activity Tolerance: Patient tolerated treatment well;No increased pain Patient left: in bed;with call bell/phone within reach;with bed alarm set Nurse Communication: Mobility status PT Visit Diagnosis: Other abnormalities of gait and mobility (R26.89)     Time: 3016-0109 PT Time Calculation (min) (ACUTE ONLY): 24 min  Charges:  $Gait Training: 23-37 mins                    Chesley Noon, PTA 02/24/21, 11:47 AM

## 2021-02-24 NOTE — Progress Notes (Signed)
SATURATION QUALIFICATIONS:    Patient Saturations on Room Air at Rest = 84%  Patient Saturations on Room Air while Ambulating = 78%  Patient Saturations on 2 Liters of oxygen while Ambulating = 92%  Please briefly explain why patient needs home oxygen: Patient felt fatigued ambulating on RA

## 2021-02-25 ENCOUNTER — Telehealth: Payer: Self-pay

## 2021-02-25 LAB — HEMOGLOBIN A1C
Hgb A1c MFr Bld: 7.4 % — ABNORMAL HIGH (ref 4.8–5.6)
Mean Plasma Glucose: 166 mg/dL

## 2021-02-25 NOTE — Telephone Encounter (Signed)
Transition Care Management Follow-up Telephone Call Date of discharge and from where: 02/24/21 from [facility] How have you been since you were released from the hospital? better Any questions or concerns? No  Items Reviewed: Did the pt receive and understand the discharge instructions provided? Yes  Medications obtained and verified? Yes  Other? No  Any new allergies since your discharge? No  Dietary orders reviewed? No Do you have support at home? Yes   Home Care and Equipment/Supplies: Were home health services ordered? no  Functional Questionnaire: (I = Independent and D = Dependent) ADLs: I  Bathing/Dressing- I  Meal Prep- I  Eating- I  Maintaining continence- I  Transferring/Ambulation- I  Managing Meds- I  Follow up appointments reviewed:  PCP Hospital f/u appt confirmed? Yes  Scheduled to see Dr.Karamalegos on 12/19 @ 2:20. [place] [facility] f/u appt confirmed? No   Are transportation arrangements needed? No  If their condition worsens, is the pt aware to call PCP or go to the Emergency Dept.? Yes Was the patient provided with contact information for the PCP's office or ED? Yes Was to pt encouraged to call back with questions or concerns? Yes

## 2021-02-27 ENCOUNTER — Telehealth: Payer: Self-pay

## 2021-02-27 ENCOUNTER — Telehealth: Payer: Self-pay | Admitting: Pulmonary Disease

## 2021-02-27 NOTE — Telephone Encounter (Signed)
LVM to return my call

## 2021-02-28 LAB — CULTURE, BLOOD (ROUTINE X 2)
Culture: NO GROWTH
Culture: NO GROWTH
Special Requests: ADEQUATE
Special Requests: ADEQUATE

## 2021-02-28 NOTE — Telephone Encounter (Signed)
Pt calling to find out which DME companies were close to him.gave him some recommendations, nothing further needed.

## 2021-03-03 ENCOUNTER — Other Ambulatory Visit: Payer: Self-pay | Admitting: Cardiovascular Disease

## 2021-03-04 ENCOUNTER — Ambulatory Visit (INDEPENDENT_AMBULATORY_CARE_PROVIDER_SITE_OTHER): Payer: PPO | Admitting: Family Medicine

## 2021-03-04 ENCOUNTER — Telehealth: Payer: Self-pay

## 2021-03-04 ENCOUNTER — Other Ambulatory Visit: Payer: Self-pay

## 2021-03-04 ENCOUNTER — Encounter: Payer: Self-pay | Admitting: Family Medicine

## 2021-03-04 VITALS — Wt 114.0 lb

## 2021-03-04 DIAGNOSIS — J441 Chronic obstructive pulmonary disease with (acute) exacerbation: Secondary | ICD-10-CM

## 2021-03-04 DIAGNOSIS — J189 Pneumonia, unspecified organism: Secondary | ICD-10-CM | POA: Diagnosis not present

## 2021-03-04 MED ORDER — PREDNISONE 20 MG PO TABS: ORAL_TABLET | ORAL | 0 refills | Status: DC

## 2021-03-04 NOTE — Patient Instructions (Signed)
° °  Please schedule a Follow-up Appointment to: No follow-ups on file.  If you have any other questions or concerns, please feel free to call the office or send a message through [facility]. You may also schedule an earlier appointment if necessary.  Additionally, you may be receiving a survey about your experience at our office within a few days to 1 week by e-mail or mail. We value your feedback.  Nobie Putnam, DO [place]

## 2021-03-04 NOTE — Progress Notes (Signed)
Subjective:    Patient ID: Stanley Mclean, male    DOB: 15-May-1940, 80 y.o.   MRN: [id number]  Stanley Mclean is a 80 y.o. male presenting on 03/04/2021 for Hospitalization Follow-up and COPD  Virtual / Telehealth Encounter - Telephone Visit The purpose of this virtual visit is to provide medical care while limiting exposure to the novel coronavirus (COVID19) for both patient and office staff.  Consent was obtained for remote visit:  Yes.   Answered questions that patient had about telehealth interaction:  Yes.   I discussed the limitations, risks, security and privacy concerns of performing an evaluation and management service by video/telephone. I also discussed with the patient that there may be a patient responsible charge related to this service. The patient expressed understanding and agreed to proceed.  Patient Location: Home Provider Location: [facility] (Office)  Participants in virtual visit: - Patient: [name] - CMA: Orinda Kenner, CMA - Provider: Dr Parks Ranger   [facility] FOLLOW-UP VISIT  Hospital/Location: [facility] Date of Admission: 12.9.22 Date of Discharge: 12.11.22 Transitions of care telephone call: 12.12.22  Reason for Admission: Acute COPD Exac / Pneumonia  - Hospital H&P and Discharge Summary have been reviewed - Patient presents today 8 days  after recent hospitalization. Brief summary of recent course, patient had symptoms of pneumonia and COPD, hospitalized treated with antibiotics, steroids, discharged on supplemental oxygen  - Today reports overall has symptoms of persistent weakness and fatigue and dyspnea. He remains on 2 L oxygen. Off of Zpak and Prednisone, those helped. No fever, pulm apt is scheduled 04/01/21  Has switched Trelegy to [facility] instead since improved.   I have reviewed the discharge medication list, and have reconciled the current and discharge medications today.   Current Outpatient Medications:     aspirin EC 81 MG tablet, Take 1 tablet (81 mg total) by mouth daily., Disp: 90 tablet, Rfl: 3   atorvastatin (LIPITOR) 40 MG tablet, TAKE 1 TABLET BY MOUTH EVERY DAY, Disp: 90 tablet, Rfl: 0   FLUZONE HIGH-DOSE QUADRIVALENT 0.7 ML SUSY, , Disp: , Rfl:    metFORMIN (GLUCOPHAGE) 500 MG tablet, Take 500 mg by mouth daily with breakfast., Disp: , Rfl:    metoprolol tartrate (LOPRESSOR) 50 MG tablet, Take 1 tablet (50 mg total) by mouth 2 (two) times daily., Disp: 180 tablet, Rfl: 2   nitroGLYCERIN (NITROSTAT) 0.4 MG SL tablet, Place 1 tablet (0.4 mg total) under the tongue every 5 (five) minutes as needed for chest pain., Disp: 25 tablet, Rfl: 0   predniSONE (DELTASONE) 20 MG tablet, Take daily with food. Start with 56m (3 pills) x 2 days, then reduce to 492m(2 pills) x 2 days, then 2032m1 pill) x 3 days, Disp: 13 tablet, Rfl: 0   terazosin (HYTRIN) 1 MG capsule, Take 1 capsule (1 mg total) by mouth at bedtime., Disp: 90 capsule, Rfl: 1   TRELEGY ELLIPTA 200-62.5-25 MCG/INH AEPB, INHALE 1 PUFF DAILY, Disp: , Rfl:    albuterol (PROVENTIL HFA;VENTOLIN HFA) 108 (90 Base) MCG/ACT inhaler, Inhale 2 puffs into the lungs every 6 (six) hours as needed for wheezing or shortness of breath., Disp: 6 Inhaler, Rfl: 1  ------------------------------------------------------------------------- Social History   Tobacco Use   Smoking status: Every Day    Packs/day: 1.00    Years: 50.00    Pack years: 50.00    Types: Cigarettes    Last attempt to quit: 03/29/2016    Years since quitting:  4.9   Smokeless tobacco: Never  Vaping Use   Vaping Use: Never used  Substance Use Topics   Alcohol use: No   Drug use: No    Review of Systems Per HPI unless specifically indicated above     Objective:    Wt 114 lb (51.7 kg)    BMI 16.83 kg/m   Wt Readings from Last 3 Encounters:  03/04/21 114 lb (51.7 kg)  02/22/21 114 lb (51.7 kg)  12/18/20 114 lb (51.7 kg)    Physical Exam  Virtual telephone  visit.  Results for orders placed or performed during the hospital encounter of 02/22/21  Resp Panel by RT-PCR (Flu A&B, Covid) Nasopharyngeal Swab   Specimen: Nasopharyngeal Swab; Nasopharyngeal(NP) swabs in vial transport medium  Result Value Ref Range   SARS Coronavirus 2 by RT PCR NEGATIVE NEGATIVE   Influenza A by PCR NEGATIVE NEGATIVE   Influenza B by PCR NEGATIVE NEGATIVE  Culture, blood (routine x 2) Call MD if unable to obtain prior to antibiotics being given   Specimen: BLOOD  Result Value Ref Range   Specimen Description BLOOD BLOOD LEFT FOREARM    Special Requests      BOTTLES DRAWN AEROBIC AND ANAEROBIC Blood Culture adequate volume   Culture      NO GROWTH 5 DAYS Performed at [facility], [address] [place], [place] [address]    Report Status 02/28/2021 FINAL   Culture, blood (routine x 2) Call MD if unable to obtain prior to antibiotics being given   Specimen: BLOOD  Result Value Ref Range   Specimen Description BLOOD RIGHT ANTECUBITAL    Special Requests      BOTTLES DRAWN AEROBIC AND ANAEROBIC Blood Culture adequate volume   Culture      NO GROWTH 5 DAYS Performed at [facility], [address] [place], [place] [address]    Report Status 02/28/2021 FINAL   Basic metabolic panel  Result Value Ref Range   Sodium 134 (L) 135 - 145 mmol/L   Potassium 4.3 3.5 - 5.1 mmol/L   Chloride 95 (L) 98 - 111 mmol/L   CO2 32 22 - 32 mmol/L   Glucose, Bld 286 (H) 70 - 99 mg/dL   BUN 20 8 - 23 mg/dL   Creatinine, Ser 0.94 0.61 - 1.24 mg/dL   Calcium 9.1 8.9 - 10.3 mg/dL   GFR, Estimated >60 >60 mL/min   Anion gap 7 5 - 15  CBC with Differential/Platelet  Result Value Ref Range   WBC 12.2 (H) 4.0 - 10.5 K/uL   RBC 4.91 4.22 - 5.81 MIL/uL   Hemoglobin 15.6 13.0 - 17.0 g/dL   HCT 47.3 39.0 - 52.0 %   MCV 96.3 80.0 - 100.0 fL   MCH 31.8 26.0 - 34.0 pg   MCHC 33.0 30.0 - 36.0 g/dL   RDW 13.2 11.5 - 15.5 %   Platelets 210 150 - 400 K/uL    nRBC 0.0 0.0 - 0.2 %   Neutrophils Relative % 86 %   Neutro Abs 10.4 (H) 1.7 - 7.7 K/uL   Lymphocytes Relative 7 %   Lymphs Abs 0.9 0.7 - 4.0 K/uL   Monocytes Relative 7 %   Monocytes Absolute 0.9 0.1 - 1.0 K/uL   Eosinophils Relative 0 %   Eosinophils Absolute 0.0 0.0 - 0.5 K/uL   Basophils Relative 0 %   Basophils Absolute 0.0 0.0 - 0.1 K/uL   Immature Granulocytes 0 %   Abs  Immature Granulocytes 0.04 0.00 - 0.07 K/uL  HIV Antibody (routine testing w rflx)  Result Value Ref Range   HIV Screen 4th Generation wRfx Non Reactive Non Reactive  Hemoglobin A1c  Result Value Ref Range   Hgb A1c MFr Bld 7.4 (H) 4.8 - 5.6 %   Mean Plasma Glucose 166 mg/dL  Glucose, capillary  Result Value Ref Range   Glucose-Capillary 195 (H) 70 - 99 mg/dL   Comment 1 Notify RN    Comment 2 Document in Chart   Glucose, capillary  Result Value Ref Range   Glucose-Capillary 268 (H) 70 - 99 mg/dL  Glucose, capillary  Result Value Ref Range   Glucose-Capillary 188 (H) 70 - 99 mg/dL  Glucose, capillary  Result Value Ref Range   Glucose-Capillary 175 (H) 70 - 99 mg/dL   Comment 1 Notify RN   Glucose, capillary  Result Value Ref Range   Glucose-Capillary 98 70 - 99 mg/dL  Glucose, capillary  Result Value Ref Range   Glucose-Capillary 146 (H) 70 - 99 mg/dL  CBG monitoring, ED  Result Value Ref Range   Glucose-Capillary 252 (H) 70 - 99 mg/dL  Troponin I (High Sensitivity)  Result Value Ref Range   Troponin I (High Sensitivity) 13 <18 ng/L  Troponin I (High Sensitivity)  Result Value Ref Range   Troponin I (High Sensitivity) 16 <18 ng/L      Assessment & Plan:   Problem List Items Addressed This Visit     COPD with acute exacerbation (HCC) - Primary   Relevant Medications   predniSONE (DELTASONE) 20 MG tablet   Other Visit Diagnoses     Community acquired pneumonia, unspecified laterality           HFU CAP - resolved COPD Exacerbation Acute on chronic respiratory failure - now on 2L  supplemental O2 Weaned from 4L in hospital Improved but still has significant weakness fatigue. Will extend Prednisone taper to 7 more days Completed antibiotics Forward chart to Pulm, to work him in sooner if possible, his apt is 04/01/21. Return precautions reviewed.  He may resume previous BP medications as well, that were held in hospital Lisinopril, Amlodipine. His BP today 160.  Meds ordered this encounter  Medications   predniSONE (DELTASONE) 20 MG tablet    Sig: Take daily with food. Start with 56m (3 pills) x 2 days, then reduce to 45m(2 pills) x 2 days, then 2052m1 pill) x 3 days    Dispense:  13 tablet    Refill:  0    Follow up plan: Return if symptoms worsen or fail to improve.   Patient verbalizes understanding with the above medical recommendations including the limitation of remote medical advice.  Specific follow-up and call-back criteria were given for patient to follow-up or seek medical care more urgently if needed.  Total duration of direct patient care provided via telephone: 12 minutes   AleNobie PutnamO [place] 03/04/2021, 2:31 PM

## 2021-03-04 NOTE — Telephone Encounter (Signed)
Spoke to patient and offered OV for 03/05/2021 at 11:30. Patient stated that he would attempt to come but he is not sure if he can make it with the shape that he is in.  He requested that I go ahead and schedule the visit. He will call back if appt needs to be rescheduled.

## 2021-03-04 NOTE — Telephone Encounter (Signed)
-----  Message from Tyler Pita, MD sent at 03/04/2021  3:03 PM EST ----- Sheppard Coil,  Yes ,unfortunately with the holiday schedule it has been quite tight to put these patients on the schedule.  That coupled with the fact that I also take call for the hospital in the ICU. Looking at the schedule I may have a slot either Tuesday or Wednesday I am including Claudette Head my team lead to see if we can put him in there.  LG ----- Message ----- From: Olin Hauser, DO Sent: 03/04/2021   2:53 PM EST To: Tyler Pita, MD  Dr Patsey Berthold, I just spoke with this patient in a virtual hospital follow-up today. He is recovering from pneumonia and COPD exacerbation hospitalization, he was not previously on Supplemental O2, now leaving hospital he was weaned from 4L down to 2L. He is still having significant weakness, fatigue and respiratory symptoms. I have extended his prednisone out to a 7 day additional taper. His HFU apt with you is 04/01/21. Would you be able to look into getting him worked in sooner if at all possible? Maybe in 1-2 weeks from now even if that works. Thank you!

## 2021-03-05 ENCOUNTER — Ambulatory Visit: Payer: PPO | Admitting: Pulmonary Disease

## 2021-03-05 ENCOUNTER — Encounter: Payer: Self-pay | Admitting: Pulmonary Disease

## 2021-03-05 VITALS — BP 134/78 | HR 67 | Temp 97.3°F | Ht 69.0 in | Wt 119.0 lb

## 2021-03-05 DIAGNOSIS — J849 Interstitial pulmonary disease, unspecified: Secondary | ICD-10-CM

## 2021-03-05 DIAGNOSIS — J449 Chronic obstructive pulmonary disease, unspecified: Secondary | ICD-10-CM

## 2021-03-05 DIAGNOSIS — R911 Solitary pulmonary nodule: Secondary | ICD-10-CM

## 2021-03-05 DIAGNOSIS — J9611 Chronic respiratory failure with hypoxia: Secondary | ICD-10-CM

## 2021-03-05 DIAGNOSIS — I272 Pulmonary hypertension, unspecified: Secondary | ICD-10-CM

## 2021-03-05 MED ORDER — BREZTRI AEROSPHERE 160-9-4.8 MCG/ACT IN AERO: 2.0000 | INHALATION_SPRAY | Freq: Two times a day (BID) | RESPIRATORY_TRACT | 0 refills | Status: DC

## 2021-03-05 NOTE — Patient Instructions (Addendum)
We are changing your inhaler to 1 called Breztri this will be 2 puffs twice a day.  Know how you do with this inhaler so we can call it in to your pharmacy.  It appears to be covered by her current plan.  DO NOT USE THE DULERA WHILE USING THE BREZTRI.  We have given you a valve that you can use to loosen up your phlegm when it gets too thick.  Have your oxygen on at all times.  We have entered a referral to [employer] to check your equipment and teach you how to use it.  They will also try to fit you with smaller tanks.  We are going to get a heart test, lung test and a scan of the chest to evaluate the spot that is in your lung.  We will try as much as possible to get these done on the same day.  We will see you in follow-up after the above tests are done.  Make every effort you can make to stop smoking.

## 2021-03-05 NOTE — Progress Notes (Signed)
Subjective:    Patient ID: Stanley Mclean, male    DOB: [birth year], 80 y.o.   MRN: [id number]  Chief Complaint  Patient presents with   New Patient (Initial Visit)    COPD, chronic respiratory failure   HPI Patient is a 80 year old current smoker with a 60-pack-year history of smoking and a history as noted below who presents for evaluation and management of COPD and chronic respiratory failure with hypoxia.  Patient is a very terse historian.  He notes shortness of breath with minimal activity.  Mobility is limited due to right BKA (traumatic, 2013).  He states that he does not wear oxygen all the time as he has been prescribed previously.  He is currently on 2 L/min with saturations of 90%.  He states that he needs a more portable source.  A recent chest x-ray obtained 22 February 2021 showed evidence of COPD, interstitial lung disease and a left upper lobe lung opacity.  We are asked to render opinion on these issues.  The patient is currently on Dulera and no LAMA, he uses albuterol as needed.  Continues to smoke 1 pack of cigarettes per day and has no intention of quitting.  States he has been smoking "all of his life".  He does not endorse any wheezing per se.  Does have difficult to manage secretions due to being very tenacious and thick.  These are difficult to expectorate.  During the visit today his cough mechanics are very poor.  He has not had any recent fevers, chills or sweats.  Does not endorse any weight loss or anorexia.  No other complaints voiced.   Review of Systems A 10 point review of systems was performed and it is as noted above otherwise negative.  Past Medical History:  Diagnosis Date   Anemia    Basal cell carcinoma    BPH (benign prostatic hyperplasia)    CAD (coronary artery disease)    a. s/p multiple prior PCIs b. s/p DES-distal RCA 05/2013   COPD (chronic obstructive pulmonary disease) (HCC)    GERD (gastroesophageal reflux disease)    Macular degeneration     Pure hypercholesterolemia    Stroke (HCC)    Tobacco abuse    Traumatic amputation of leg(s) (complete) (partial), unilateral, level not specified, without mention of complication 08/26/2011   Right Below Knee Amputation   Unspecified essential hypertension    Past Surgical History:  Procedure Laterality Date   AMPUTATION Right 08/26/2011   Below Knee Amputee   APPENDECTOMY     CARDIAC CATHETERIZATION     CAROTID ANGIOGRAPHY Right 05/14/2016   Procedure: Carotid Angiography;  Surgeon: Renford Dills, MD;  Location: [facility] INVASIVE CV LAB;  Service: Cardiovascular;  Laterality: Right;   CAROTID PTA/STENT INTERVENTION  05/14/2016   Procedure: Carotid PTA/Stent Intervention;  Surgeon: Renford Dills, MD;  Location: [facility] INVASIVE CV LAB;  Service: Cardiovascular;;   CORONARY ANGIOPLASTY WITH STENT PLACEMENT  09/28/2006   RCA   CORONARY ANGIOPLASTY WITH STENT PLACEMENT  07/21/2006   Mid LAD   CORONARY ANGIOPLASTY WITH STENT PLACEMENT  06/06/2013   30% prox LAD at the site of a prior stent, 40% mid LAD at the origin of D1, 40% ostial D1, 30% prox LCx at the site of a prior stent, in the proximal third of the vessel segment, 20% prox RCA, 99% distal RCA s/p DES. EF >55%.   Family History  Problem Relation Age of Onset   Mental illness Mother  Social History   Tobacco Use   Smoking status: Every Day    Packs/day: 1.00    Years: 60.00    Pack years: 60.00    Types: Cigarettes    Last attempt to quit: 03/29/2016    Years since quitting: 4.9   Smokeless tobacco: Never   Tobacco comments:    1 pack will last 1 week--03/05/21  Substance Use Topics   Alcohol use: No   No Known Allergies  Current Meds  Medication Sig   aspirin EC 81 MG tablet Take 1 tablet (81 mg total) by mouth daily.   atorvastatin (LIPITOR) 40 MG tablet TAKE 1 TABLET BY MOUTH EVERY DAY   metFORMIN (GLUCOPHAGE) 500 MG tablet Take 500 mg by mouth daily with breakfast.   metoprolol tartrate (LOPRESSOR) 50 MG  tablet Take 1 tablet (50 mg total) by mouth 2 (two) times daily.   mometasone-formoterol (DULERA) 200-5 MCG/ACT AERO Inhale 2 puffs into the lungs 2 (two) times daily.   nitroGLYCERIN (NITROSTAT) 0.4 MG SL tablet Place 1 tablet (0.4 mg total) under the tongue every 5 (five) minutes as needed for chest pain.   predniSONE (DELTASONE) 20 MG tablet Take daily with food. Start with 60mg  (3 pills) x 2 days, then reduce to 40mg  (2 pills) x 2 days, then 20mg  (1 pill) x 3 days   terazosin (HYTRIN) 1 MG capsule Take 1 capsule (1 mg total) by mouth at bedtime.   [DISCONTINUED] FLUZONE HIGH-DOSE QUADRIVALENT 0.7 ML SUSY    [DISCONTINUED] TRELEGY ELLIPTA 200-62.5-25 MCG/INH AEPB INHALE 1 PUFF DAILY   Immunization History  Administered Date(s) Administered   Influenza, High Dose Seasonal PF 11/25/2016, 12/15/2017, 11/16/2018, 03/26/2020   Influenza-Unspecified 11/25/2016, 11/15/2017   PFIZER(Purple Top)SARS-COV-2 Vaccination 04/18/2019, 05/08/2019      Objective:   Physical Exam BP 134/78 (BP Location: Left Arm, Cuff Size: Normal)   Pulse 67   Temp (!) 97.3 F (36.3 C) (Temporal)   Ht 5\' 9"  (1.753 m)   Wt 119 lb (54 kg) Comment: weight is per patient.  SpO2 90%   BMI 17.57 kg/m   SpO2: 90 % O2 Device: Nasal cannula O2 Flow Rate (L/min): 2 L/min O2 Type: Continuous O2  GENERAL: Debilitated appearing gentleman, no overt acute distress, uses accessory use of respiration.  Wearing oxygen at 2 L/min.  Presents in transport chair. HEAD: Normocephalic, atraumatic.  EYES: Pupils equal, round, reactive to light.  No scleral icterus.  MOUTH: Nose/mouth/throat not examined due to institutional masking requirements. NECK: Supple. No thyromegaly. Trachea midline. No JVD.  No adenopathy. PULMONARY: Distant breath sounds bilaterally.  Coarse, rhonchi present, no wheezes.  Positive Hoover's sign. CARDIOVASCULAR: S1 and S2. Regular rate and rhythm.  No rubs, murmurs or gallops heard. ABDOMEN:  Benign. MUSCULOSKELETAL: Right BKA, no clubbing, no edema.  NEUROLOGIC: No overt neurodeficit noted. SKIN: Intact,warm,dry. PSYCH: Flat affect, normal behavior.   Chest x-ray performed 22 February 2021, independently reviewed, there is a left upper lobe nodule, changes consistent with COPD and interstitial lung disease:     Assessment & Plan:     ICD-10-CM   1. COPD suggested by initial evaluation (HCC)  J44.9 Flutter valve    Pulmonary Function Test [facility] Only   Stage IV COPD by clinical impression He is only on LABA/ICS Dulera has not been indicated for COPD We will give a trial of Breztri    2. Chronic respiratory failure with hypoxia (HCC)  J96.11 AMB REFERRAL FOR DME   Imperative that he wears oxygen  24/7 Will order more portable source of oxygen    3. Pulmonary hypertension (HCC)  I27.20 ECHOCARDIOGRAM COMPLETE   This issue adds complexity to his management Will check echocardiogram    4. ILD (interstitial lung disease) (HCC)  J84.9    Suspect related to smoking Obtaining CT chest    5. Lung nodule seen on imaging study  R91.1 CT Chest Wo Contrast   Obtaining CT chest to verify     Orders Placed This Encounter  Procedures   CT Chest Wo Contrast    Next available.    Standing Status:   Future    Number of Occurrences:   1    Standing Expiration Date:   03/05/2022    Order Specific Question:   Preferred imaging location?    Answer:   [facility]   AMB REFERRAL FOR DME    Referral Priority:   Routine    Referral Type:   Durable Medical Equipment Purchase    Number of Visits Requested:   1   Flutter valve   ECHOCARDIOGRAM COMPLETE    Standing Status:   Future    Number of Occurrences:   1    Standing Expiration Date:   09/03/2021    Order Specific Question:   Where should this test be performed    Answer:   [facility]    Order Specific Question:   Perflutren DEFINITY (image enhancing agent) should be administered unless hypersensitivity or allergy  exist    Answer:   Administer Perflutren    Order Specific Question:   Reason for exam-Echo    Answer:   Dyspnea  R06.00   Meds ordered this encounter  Medications   Budeson-Glycopyrrol-Formoterol (BREZTRI AEROSPHERE) 160-9-4.8 MCG/ACT AERO    Sig: Inhale 2 puffs into the lungs in the morning and at bedtime.    Dispense:  5.9 g    Refill:  0    Order Specific Question:   Lot Number?    Answer:   [id number]    Order Specific Question:   Expiration Date?    Answer:   08/16/2023    Order Specific Question:   Manufacturer?    Answer:   [employer] [[id number]]    Order Specific Question:   Quantity    Answer:   2   The patient was instructed that he needs to have his oxygen on at all times.  He has been very erratic and using the oxygen.  In addition we have maximized COPD management with Breztri 2 puffs twice a day.  Patient received samples.  He will be scheduled for CT scan to evaluate the lung opacity noted on chest x-ray.  Patient was counseled regards to discontinuation of smoking.  Will see the patient in follow-up after all testing requested above has been completed.  He is to contact us prior to that time should any new difficulties arise.  Gailen Shelter, MD [facility]ch[facility]    *This note was dictated using voice recognition software/Dragon.  Despite best efforts to proofread, errors can occur which can change the meaning. Any transcriptional errors that result from this process are unintentional and may not be fully corrected at the time of dictation.

## 2021-03-07 DIAGNOSIS — E46 Unspecified protein-calorie malnutrition: Secondary | ICD-10-CM | POA: Diagnosis not present

## 2021-03-07 DIAGNOSIS — Z89511 Acquired absence of right leg below knee: Secondary | ICD-10-CM | POA: Diagnosis not present

## 2021-03-07 DIAGNOSIS — Z681 Body mass index (BMI) 19 or less, adult: Secondary | ICD-10-CM | POA: Diagnosis not present

## 2021-03-07 DIAGNOSIS — I10 Essential (primary) hypertension: Secondary | ICD-10-CM | POA: Diagnosis not present

## 2021-03-07 DIAGNOSIS — Z8701 Personal history of pneumonia (recurrent): Secondary | ICD-10-CM | POA: Diagnosis not present

## 2021-03-07 DIAGNOSIS — J449 Chronic obstructive pulmonary disease, unspecified: Secondary | ICD-10-CM | POA: Diagnosis not present

## 2021-03-07 DIAGNOSIS — Z9981 Dependence on supplemental oxygen: Secondary | ICD-10-CM | POA: Diagnosis not present

## 2021-03-11 ENCOUNTER — Other Ambulatory Visit: Payer: Self-pay | Admitting: Cardiovascular Disease

## 2021-03-12 DIAGNOSIS — J9601 Acute respiratory failure with hypoxia: Secondary | ICD-10-CM | POA: Diagnosis not present

## 2021-03-12 DIAGNOSIS — J441 Chronic obstructive pulmonary disease with (acute) exacerbation: Secondary | ICD-10-CM | POA: Diagnosis not present

## 2021-03-26 ENCOUNTER — Other Ambulatory Visit: Payer: Self-pay | Admitting: Cardiovascular Disease

## 2021-03-28 DIAGNOSIS — J80 Acute respiratory distress syndrome: Secondary | ICD-10-CM | POA: Diagnosis not present

## 2021-03-28 DIAGNOSIS — R069 Unspecified abnormalities of breathing: Secondary | ICD-10-CM | POA: Diagnosis not present

## 2021-03-28 DIAGNOSIS — R0902 Hypoxemia: Secondary | ICD-10-CM | POA: Diagnosis not present

## 2021-04-01 ENCOUNTER — Institutional Professional Consult (permissible substitution): Payer: PPO | Admitting: Pulmonary Disease

## 2021-04-03 ENCOUNTER — Other Ambulatory Visit: Payer: Self-pay

## 2021-04-03 ENCOUNTER — Ambulatory Visit (HOSPITAL_BASED_OUTPATIENT_CLINIC_OR_DEPARTMENT_OTHER)
Admission: RE | Admit: 2021-04-03 | Discharge: 2021-04-03 | Disposition: A | Discharge status: Home / Self Care | Payer: PPO | Source: Ambulatory Visit | Attending: Pulmonary Disease | Admitting: Pulmonary Disease

## 2021-04-03 ENCOUNTER — Ambulatory Visit
Admission: RE | Admit: 2021-04-03 | Discharge: 2021-04-03 | Disposition: A | Discharge status: Home / Self Care | Payer: PPO | Source: Ambulatory Visit | Attending: Pulmonary Disease | Admitting: Pulmonary Disease

## 2021-04-03 DIAGNOSIS — R911 Solitary pulmonary nodule: Secondary | ICD-10-CM | POA: Insufficient documentation

## 2021-04-03 DIAGNOSIS — J439 Emphysema, unspecified: Secondary | ICD-10-CM | POA: Diagnosis not present

## 2021-04-03 DIAGNOSIS — R0609 Other forms of dyspnea: Secondary | ICD-10-CM

## 2021-04-03 DIAGNOSIS — I700 Atherosclerosis of aorta: Secondary | ICD-10-CM | POA: Diagnosis not present

## 2021-04-03 DIAGNOSIS — I272 Pulmonary hypertension, unspecified: Secondary | ICD-10-CM

## 2021-04-03 LAB — ECHOCARDIOGRAM COMPLETE
AR max vel: 2.06 cm2
AV Area VTI: 2.45 cm2
AV Area mean vel: 1.96 cm2
AV Mean grad: 2 mmHg
AV Peak grad: 4 mmHg
Ao pk vel: 1 m/s
Area-P 1/2: 4.21 cm2
MV VTI: 1.74 cm2
S' Lateral: 2.7 cm

## 2021-04-03 NOTE — Progress Notes (Signed)
*  PRELIMINARY RESULTS* Echocardiogram 2D Echocardiogram has been performed.  Stanley Mclean 04/03/2021, 10:38 AM

## 2021-04-08 ENCOUNTER — Other Ambulatory Visit: Payer: Self-pay | Admitting: Pulmonary Disease

## 2021-04-08 DIAGNOSIS — J9601 Acute respiratory failure with hypoxia: Secondary | ICD-10-CM

## 2021-04-08 DIAGNOSIS — J449 Chronic obstructive pulmonary disease, unspecified: Secondary | ICD-10-CM

## 2021-04-08 MED ORDER — IPRATROPIUM BROMIDE 0.06 % NA SOLN: 2.0000 | Freq: Four times a day (QID) | NASAL | 0 refills | Status: AC

## 2021-04-08 MED ORDER — ALBUTEROL SULFATE HFA 108 (90 BASE) MCG/ACT IN AERS: 2.0000 | INHALATION_SPRAY | Freq: Four times a day (QID) | RESPIRATORY_TRACT | 0 refills | Status: DC | PRN

## 2021-04-08 MED ORDER — BREZTRI AEROSPHERE 160-9-4.8 MCG/ACT IN AERO: 2.0000 | INHALATION_SPRAY | Freq: Two times a day (BID) | RESPIRATORY_TRACT | 0 refills | Status: AC

## 2021-04-08 NOTE — Addendum Note (Signed)
Addended by: Carlisle Cater on: 04/08/2021 05:02 PM   Modules accepted: Orders

## 2021-04-08 NOTE — Telephone Encounter (Signed)
Dr Patsey Berthold Please advise:   Patient called in stating he needs  a refill on Atrovent nasal spray and Albuterol Proventil inhaler. However we have never given these  medications to him states the [place] has given it to him and it has ran out. He did request a 3 month supply of Breztri and I sent it in.   I have pended the nasal spray and Albuterol if you would like to send those in for him.   Patient also  states that carrying his O2 tank puts to much of a strain on his back and thinks he would do better with a portable, offered him an appointment to come in office and he states he is unsure if he would be able to come in since he can not drive at the moment.   I tried offering him an appointment to discuss his breathing issues as well patient declined.

## 2021-04-08 NOTE — Telephone Encounter (Signed)
Okay to refill the albuterol and Atrovent nasal spray.

## 2021-04-08 NOTE — Telephone Encounter (Signed)
Order placed to [facility] for [employer]. Nothing further needed.

## 2021-04-09 ENCOUNTER — Other Ambulatory Visit: Payer: Self-pay

## 2021-04-09 DIAGNOSIS — R911 Solitary pulmonary nodule: Secondary | ICD-10-CM

## 2021-04-12 DIAGNOSIS — J9601 Acute respiratory failure with hypoxia: Secondary | ICD-10-CM | POA: Diagnosis not present

## 2021-04-12 DIAGNOSIS — J441 Chronic obstructive pulmonary disease with (acute) exacerbation: Secondary | ICD-10-CM | POA: Diagnosis not present

## 2021-04-30 DIAGNOSIS — Z681 Body mass index (BMI) 19 or less, adult: Secondary | ICD-10-CM | POA: Diagnosis not present

## 2021-04-30 DIAGNOSIS — Z89511 Acquired absence of right leg below knee: Secondary | ICD-10-CM | POA: Diagnosis not present

## 2021-04-30 DIAGNOSIS — Z9981 Dependence on supplemental oxygen: Secondary | ICD-10-CM | POA: Diagnosis not present

## 2021-04-30 DIAGNOSIS — I69354 Hemiplegia and hemiparesis following cerebral infarction affecting left non-dominant side: Secondary | ICD-10-CM | POA: Diagnosis not present

## 2021-04-30 DIAGNOSIS — E46 Unspecified protein-calorie malnutrition: Secondary | ICD-10-CM | POA: Diagnosis not present

## 2021-04-30 DIAGNOSIS — H353290 Exudative age-related macular degeneration, unspecified eye, stage unspecified: Secondary | ICD-10-CM | POA: Diagnosis not present

## 2021-04-30 DIAGNOSIS — D692 Other nonthrombocytopenic purpura: Secondary | ICD-10-CM | POA: Diagnosis not present

## 2021-04-30 DIAGNOSIS — J449 Chronic obstructive pulmonary disease, unspecified: Secondary | ICD-10-CM | POA: Diagnosis not present

## 2021-04-30 DIAGNOSIS — I2723 Pulmonary hypertension due to lung diseases and hypoxia: Secondary | ICD-10-CM | POA: Diagnosis not present

## 2021-05-13 DIAGNOSIS — J9601 Acute respiratory failure with hypoxia: Secondary | ICD-10-CM | POA: Diagnosis not present

## 2021-05-13 DIAGNOSIS — J441 Chronic obstructive pulmonary disease with (acute) exacerbation: Secondary | ICD-10-CM | POA: Diagnosis not present

## 2021-06-10 DIAGNOSIS — J9601 Acute respiratory failure with hypoxia: Secondary | ICD-10-CM | POA: Diagnosis not present

## 2021-06-10 DIAGNOSIS — J441 Chronic obstructive pulmonary disease with (acute) exacerbation: Secondary | ICD-10-CM | POA: Diagnosis not present

## 2021-06-15 ENCOUNTER — Other Ambulatory Visit: Payer: Self-pay | Admitting: Cardiovascular Disease

## 2021-06-17 NOTE — Telephone Encounter (Signed)
Please schedule 6 month F/U appointment. Thank you! ?

## 2021-06-17 NOTE — Telephone Encounter (Signed)
Patient in bed wants a call back another time .  ?

## 2021-06-25 NOTE — Telephone Encounter (Signed)
Scheduled

## 2021-06-25 NOTE — Telephone Encounter (Signed)
Attempted to schedule.  

## 2021-06-29 ENCOUNTER — Other Ambulatory Visit: Payer: Self-pay | Admitting: Cardiovascular Disease

## 2021-07-01 ENCOUNTER — Ambulatory Visit (INDEPENDENT_AMBULATORY_CARE_PROVIDER_SITE_OTHER): Payer: PPO

## 2021-07-01 ENCOUNTER — Telehealth: Payer: Self-pay

## 2021-07-01 VITALS — BP 139/67

## 2021-07-01 DIAGNOSIS — Z135 Encounter for screening for eye and ear disorders: Secondary | ICD-10-CM

## 2021-07-01 DIAGNOSIS — Z0000 Encounter for general adult medical examination without abnormal findings: Secondary | ICD-10-CM

## 2021-07-01 NOTE — Telephone Encounter (Signed)
FYI the pt informed me today that he is no longer taking Metformin. He said the Metformin was discontinued by someone about 2 months ago. He was not sure who discontinued the medication. His last A1C 7.4% was 4 mths ago. He currently doesn't have a follow up appointment with you. Would you like for me to schedule one with you maybe within the next few weeks?  ?

## 2021-07-01 NOTE — Telephone Encounter (Signed)
Okay. His Diabetes has been managed by the [place], I have never managed his Diabetes here at [facility]. ? ?He can schedule to follow-up and discuss further. ? ?[facility] he should check with them on the Metformin. I have not ordered it before. ? ?Nobie Putnam, DO ?[facility] ?[facility] ?07/01/2021, 5:43 PM ? ?

## 2021-07-01 NOTE — Progress Notes (Signed)
? ? ?I connected with Stanley Mclean today by telephone and verified that I am speaking with the correct person using two identifiers. ?Location patient: home ?Location provider: work ?Persons participating in the virtual visit:  Stanley Mesa CNA, Stanley Mclean , Stanley Mclean  ?  ?I discussed the limitations, risks, security and privacy concerns of performing an evaluation and management service by telephone and the availability of in person appointments. I also discussed with the patient that there may be a patient responsible charge related to this service. The patient expressed understanding and verbally consented to this telephonic visit.  ?  ? ?Subjective:  ? Stanley Mclean is a 81 y.o. male who presents for Medicare Annual/Subsequent preventive examination. ? ?Review of Systems    ?Per HPI unless specifically indicated below ?Cardiac Risk Factors include: advanced age (>52mn, >>58women);diabetes mellitus;male gender;sedentary lifestyle ? ?   ?Objective:  ?  ?Today's Vitals  ? 07/01/21 1417  ?BP: 139/67  ?BP pt reported from home blood pressure monitor  ?There is no height or weight on file to calculate BMI. ? ? ?  07/01/2021  ?  2:21 PM 02/23/2021  ?  5:38 AM 02/22/2021  ?  9:51 PM 07/15/2016  ?  1:56 PM 05/14/2016  ? 11:20 AM 05/05/2016  ?  1:59 PM 03/30/2016  ?  3:48 AM  ?Advanced Directives  ?Does Patient Have a Medical Advance Directive? No  _0   ?Would patient like information on creating a medical advance directive? No - Patient declined No - Patient declined  No - Patient declined No - Patient declined  No - Patient declined  ? ? ?Current Medications (verified) ?Outpatient Encounter Medications as of 07/01/2021  ?Medication Sig  ? albuterol (VENTOLIN HFA) 108 (90 Base) MCG/ACT inhaler Inhale 2 puffs into the lungs every 6 (six) hours as needed for wheezing or shortness of breath.  ? aspirin EC 81 MG tablet Take 1 tablet (81 mg total) by mouth daily.  ? atorvastatin (LIPITOR) 40 MG tablet TAKE 1 TABLET BY  MOUTH EVERY DAY  ? Budeson-Glycopyrrol-Formoterol (BREZTRI AEROSPHERE) 160-9-4.8 MCG/ACT AERO Inhale 2 puffs into the lungs in the morning and at bedtime.  ? ipratropium (ATROVENT) 0.06 % nasal spray Place 2 sprays into both nostrils 4 (four) times daily.  ? nitroGLYCERIN (NITROSTAT) 0.4 MG SL tablet Place 1 tablet (0.4 mg total) under the tongue every 5 (five) minutes as needed for chest pain.  ? terazosin (HYTRIN) 1 MG capsule TAKE 1 CAPSULE BY MOUTH EVERYDAY AT BEDTIME  ? [DISCONTINUED] metoprolol tartrate (LOPRESSOR) 50 MG tablet TAKE 1 TABLET BY MOUTH TWICE A DAY  ? albuterol (PROVENTIL HFA;VENTOLIN HFA) 108 (90 Base) MCG/ACT inhaler Inhale 2 puffs into the lungs every 6 (six) hours as needed for wheezing or shortness of breath.  ? metFORMIN (GLUCOPHAGE) 500 MG tablet Take 500 mg by mouth daily with breakfast. (Patient not taking: Reported on 07/01/2021)  ? [DISCONTINUED] Budeson-Glycopyrrol-Formoterol (BREZTRI AEROSPHERE) 160-9-4.8 MCG/ACT AERO Inhale 2 puffs into the lungs in the morning and at bedtime. (Patient not taking: Reported on 07/01/2021)  ? [DISCONTINUED] predniSONE (DELTASONE) 20 MG tablet Take daily with food. Start with 669m(3 pills) x 2 days, then reduce to 4078m2 pills) x 2 days, then 35m17m pill) x 3 days (Patient not taking: Reported on 07/01/2021)  ? ?No facility-administered encounter medications on file as of 07/01/2021.  ? ? ?Allergies (verified) ?Patient has no known allergies.  ? ?History: ?Past Medical History:  ?Diagnosis Date  ?  Anemia   ? Basal cell carcinoma   ? BPH (benign prostatic hyperplasia)   ? CAD (coronary artery disease)   ? a. s/p multiple prior PCIs b. s/p DES-distal RCA 05/2013  ? COPD (chronic obstructive pulmonary disease) ([place])   ? GERD (gastroesophageal reflux disease)   ? Macular degeneration   ? Pure hypercholesterolemia   ? Stroke [facility])   ? Tobacco abuse   ? Traumatic amputation of leg(s) (complete) (partial), unilateral, level not specified, without mention of  complication 97/[id number]/3419  ? Right Below Knee Amputation  ? Unspecified essential hypertension   ? ?Past Surgical History:  ?Procedure Laterality Date  ? AMPUTATION Right 08/26/2011  ? Below Knee Amputee  ? APPENDECTOMY    ? CARDIAC CATHETERIZATION    ? CAROTID ANGIOGRAPHY Right 05/14/2016  ? Procedure: Carotid Angiography;  Surgeon: Katha Cabal, MD;  Location: [place] [facility];  Service: Cardiovascular;  Laterality: Right;  ? CAROTID PTA/STENT INTERVENTION  05/14/2016  ? Procedure: Carotid PTA/Stent Intervention;  Surgeon: Katha Cabal, MD;  Location: [place];  Service: Cardiovascular;;  ? CORONARY ANGIOPLASTY WITH STENT PLACEMENT  09/28/2006  ? RCA  ? CORONARY ANGIOPLASTY WITH STENT PLACEMENT  07/21/2006  ? Mid LAD  ? CORONARY ANGIOPLASTY WITH STENT PLACEMENT  06/06/2013  ? 30% prox LAD at the site of a prior stent, 40% mid LAD at the origin of D1, 40% ostial D1, 30% prox LCx at the site of a prior stent, in the proximal third of the vessel segment, 20% prox RCA, 99% distal RCA s/p DES. EF >55%.  ? ?Family History  ?Problem Relation Age of Onset  ? Mental illness Mother   ? ?Social History  ? ?Socioeconomic History  ? Marital status: Married  ?  Spouse name: Stanley Mclean  ? Number of children: Not on file  ? Years of education: Not on file  ? Highest education level: Not on file  ?Occupational History  ? Occupation: retired   ?Tobacco Use  ? Smoking status: Every Day  ?  Packs/day: 1.00  ?  Years: 60.00  ?  Pack years: 60.00  ?  Types: Cigarettes  ?  Last attempt to quit: 03/29/2016  ?  Years since quitting: 5.2  ? Smokeless tobacco: Never  ? Tobacco comments:  ?  1 pack will last 1 week--03/05/21  ?Vaping Use  ? Vaping Use: Never used  ?Substance and Sexual Activity  ? Alcohol use: No  ? Drug use: No  ? Sexual activity: Never  ?Other Topics Concern  ? Not on file  ?Social History Narrative  ? Not on file  ? ?Social Determinants of Health  ? ?Financial Resource Strain: Low Risk   ? Difficulty of  Paying Living Expenses: Not hard at all  ?Food Insecurity: No Food Insecurity  ? Worried About Charity fundraiser in the Last Year: Never true  ? Ran Out of Food in the Last Year: Never true  ?Transportation Needs: No Transportation Needs  ? Lack of Transportation (Medical): No  ? Lack of Transportation (Non-Medical): No  ?Physical Activity: Inactive  ? Days of Exercise per Week: 0 days  ? Minutes of Exercise per Session: 0 min  ?Stress: No Stress Concern Present  ? Feeling of Stress : Not at all  ?Social Connections: Socially Integrated  ? Frequency of Communication with Friends and Family: Twice a week  ? Frequency of Social Gatherings with Friends and Family: Twice a week  ? Attends Religious Services:  More than 4 times per year  ? Active Member of Clubs or Organizations: Yes  ? Attends Archivist Meetings: Never  ? Marital Status: Married  ? ? ?Tobacco Counseling ?Ready to quit: Not Answered ?Counseling given: Not Answered ?Tobacco comments: 1 pack will last 1 week--03/05/21 ? ? ?Clinical Intake: ? ?Pre-visit preparation completed: No ? ?Pain : No/denies pain ? ?  ? ?Nutritional Risks: None ?Diabetes: Yes ?CBG done?: No ?CBG resulted in Enter/ Edit results?: No ?Did pt. bring in CBG monitor from home?: No ? ?How often do you need to have someone help you when you read instructions, pamphlets, or other written materials from your doctor or pharmacy?: 1 - Never ? ?Diabetic? Yes  ? ?Interpreter Needed?: No ? ?  ? ? ?Activities of Daily Living ? ?  07/01/2021  ?  2:23 PM 02/24/2021  ?  3:54 PM  ?In your present state of health, do you have any difficulty performing the following activities:  ?Hearing? 0   ?Vision? 1   ?Difficulty concentrating or making decisions? 0   ?Walking or climbing stairs? 1   ?Dressing or bathing? 0   ?Doing errands, shopping? 0 0  ?Preparing Food and eating ? N   ?Using the Toilet? N   ?In the past six months, have you accidently leaked urine? N   ?Do you have problems with loss  of bowel control? N   ?Managing your Medications? N   ?Managing your Finances? N   ?Housekeeping or managing your Housekeeping? N   ? ? ?Patient Care Team: ?Olin Hauser, DO as PCP - General

## 2021-07-01 NOTE — Patient Instructions (Signed)
Diabetes Mellitus and Foot Care ?Foot care is an important part of your health, especially when you have diabetes. Diabetes may cause you to have problems because of poor blood flow (circulation) to your feet and legs, which can cause your skin to: ?Become thinner and drier. ?Break more easily. ?Heal more slowly. ?Peel and crack. ?You may also have nerve damage (neuropathy) in your legs and feet, causing decreased feeling in them. This means that you may not notice minor injuries to your feet that could lead to more serious problems. Noticing and addressing any potential problems early is the best way to prevent future foot problems. ?How to care for your feet ?Foot hygiene ? ?Wash your feet daily with warm water and mild soap. Do not use hot water. Then, pat your feet and the areas between your toes until they are completely dry. Do not soak your feet as this can dry your skin. ?Trim your toenails straight across. Do not dig under them or around the cuticle. File the edges of your nails with an emery board or nail file. ?Apply a moisturizing lotion or petroleum jelly to the skin on your feet and to dry, brittle toenails. Use lotion that does not contain alcohol and is unscented. Do not apply lotion between your toes. ?Shoes and socks ?Wear clean socks or stockings every day. Make sure they are not too tight. Do not wear knee-high stockings since they may decrease blood flow to your legs. ?Wear shoes that fit properly and have enough cushioning. Always look in your shoes before you put them on to be sure there are no objects inside. ?To break in new shoes, wear them for just a few hours a day. This prevents injuries on your feet. ?Wounds, scrapes, corns, and calluses ? ?Check your feet daily for blisters, cuts, bruises, sores, and redness. If you cannot see the bottom of your feet, use a mirror or ask someone for help. ?Do not cut corns or calluses or try to remove them with medicine. ?If you find a minor scrape,  cut, or break in the skin on your feet, keep it and the skin around it clean and dry. You may clean these areas with mild soap and water. Do not clean the area with peroxide, alcohol, or iodine. ?If you have a wound, scrape, corn, or callus on your foot, look at it several times a day to make sure it is healing and not infected. Check for: ?Redness, swelling, or pain. ?Fluid or blood. ?Warmth. ?Pus or a bad smell. ?General tips ?Do not cross your legs. This may decrease blood flow to your feet. ?Do not use heating pads or hot water bottles on your feet. They may burn your skin. If you have lost feeling in your feet or legs, you may not know this is happening until it is too late. ?Protect your feet from hot and cold by wearing shoes, such as at the beach or on hot pavement. ?Schedule a complete foot exam at least once a year (annually) or more often if you have foot problems. Report any cuts, sores, or bruises to your health care provider immediately. ?Where to find more information ?American Diabetes Association: www.diabetes.org ?Association of Diabetes Care & Education Specialists: www.diabeteseducator.org ?Contact a health care provider if: ?You have a medical condition that increases your risk of infection and you have any cuts, sores, or bruises on your feet. ?You have an injury that is not healing. ?You have redness on your legs or feet. ?You  feel burning or tingling in your legs or feet. ?You have pain or cramps in your legs and feet. ?Your legs or feet are numb. ?Your feet always feel cold. ?You have pain around any toenails. ?Get help right away if: ?You have a wound, scrape, corn, or callus on your foot and: ?You have pain, swelling, or redness that gets worse. ?You have fluid or blood coming from the wound, scrape, corn, or callus. ?Your wound, scrape, corn, or callus feels warm to the touch. ?You have pus or a bad smell coming from the wound, scrape, corn, or callus. ?You have a fever. ?You have a red  line going up your leg. ?Summary ?Check your feet every day for blisters, cuts, bruises, sores, and redness. ?Apply a moisturizing lotion or petroleum jelly to the skin on your feet and to dry, brittle toenails. ?Wear shoes that fit properly and have enough cushioning. ?If you have foot problems, report any cuts, sores, or bruises to your health care provider immediately. ?Schedule a complete foot exam at least once a year (annually) or more often if you have foot problems. ?This information is not intended to replace advice given to you by your health care provider. Make sure you discuss any questions you have with your health care provider. ?Document Revised: 09/22/2019 Document Reviewed: 09/22/2019 ?Elsevier Patient Education ? [place]. ? ?Fall Prevention in the Home, Adult ?Falls can cause injuries and can happen to people of all ages. There are many things you can do to make your home safe and to help prevent falls. Ask for help when making these changes. ?What actions can I take to prevent falls? ?General Instructions ?Use good lighting in all rooms. Replace any light bulbs that burn out. ?Turn on the lights in dark areas. Use night-lights. ?Keep items that you use often in easy-to-reach places. Lower the shelves around your home if needed. ?Set up your furniture so you have a clear path. Avoid moving your furniture around. ?Do not have throw rugs or other things on the floor that can make you trip. ?Avoid walking on wet floors. ?If any of your floors are uneven, fix them. ?Add color or contrast paint or tape to clearly mark and help you see: ?Grab bars or handrails. ?First and last steps of staircases. ?Where the edge of each step is. ?If you use a stepladder: ?Make sure that it is fully opened. Do not climb a closed stepladder. ?Make sure the sides of the stepladder are locked in place. ?Ask someone to hold the stepladder while you use it. ?Know where your pets are when moving through your  home. ?What can I do in the bathroom? ? ?  ? ?Keep the floor dry. Clean up any water on the floor right away. ?Remove soap buildup in the tub or shower. ?Use nonskid mats or decals on the floor of the tub or shower. ?Attach bath mats securely with double-sided, nonslip rug tape. ?If you need to sit down in the shower, use a plastic, nonslip stool. ?Install grab bars by the toilet and in the tub and shower. Do not use towel bars as grab bars. ?What can I do in the bedroom? ?Make sure that you have a light by your bed that is easy to reach. ?Do not use any sheets or blankets for your bed that hang to the floor. ?Have a firm chair with side arms that you can use for support when you get dressed. ?What can I do in the kitchen? ?  Clean up any spills right away. ?If you need to reach something above you, use a step stool with a grab bar. ?Keep electrical cords out of the way. ?Do not use floor polish or wax that makes floors slippery. ?What can I do with my stairs? ?Do not leave any items on the stairs. ?Make sure that you have a light switch at the top and the bottom of the stairs. ?Make sure that there are handrails on both sides of the stairs. Fix handrails that are broken or loose. ?Install nonslip stair treads on all your stairs. ?Avoid having throw rugs at the top or bottom of the stairs. ?Choose a carpet that does not hide the edge of the steps on the stairs. ?Check carpeting to make sure that it is firmly attached to the stairs. Fix carpet that is loose or worn. ?What can I do on the outside of my home? ?Use bright outdoor lighting. ?Fix the edges of walkways and driveways and fix any cracks. ?Remove anything that might make you trip as you walk through a door, such as a raised step or threshold. ?Trim any bushes or trees on paths to your home. ?Check to see if handrails are loose or broken and that both sides of all steps have handrails. ?Install guardrails along the edges of any raised decks and porches. ?Clear  paths of anything that can make you trip, such as tools or rocks. ?Have leaves, snow, or ice cleared regularly. ?Use sand or salt on paths during winter. ?Clean up any spills in your garage right away. This inclu

## 2021-07-04 ENCOUNTER — Other Ambulatory Visit: Payer: Self-pay | Admitting: Cardiovascular Disease

## 2021-07-11 DIAGNOSIS — J9601 Acute respiratory failure with hypoxia: Secondary | ICD-10-CM | POA: Diagnosis not present

## 2021-07-11 DIAGNOSIS — J441 Chronic obstructive pulmonary disease with (acute) exacerbation: Secondary | ICD-10-CM | POA: Diagnosis not present

## 2021-07-29 ENCOUNTER — Telehealth: Payer: Self-pay | Admitting: Cardiovascular Disease

## 2021-07-29 ENCOUNTER — Other Ambulatory Visit: Payer: Self-pay | Admitting: Family Medicine

## 2021-07-29 DIAGNOSIS — J441 Chronic obstructive pulmonary disease with (acute) exacerbation: Secondary | ICD-10-CM

## 2021-07-29 NOTE — Progress Notes (Signed)
?  ?  ? ?Virtual Visit via Telephone Note  ? ?This visit type was conducted due to national recommendations for restrictions regarding the COVID-19 Pandemic (e.g. social distancing) in an effort to limit this patient's exposure and mitigate transmission in our community.  Due to his co-morbid illnesses, this patient is at least at moderate risk for complications without adequate follow up.  This format is felt to be most appropriate for this patient at this time.  The patient did not have access to video technology/had technical difficulties with video requiring transitioning to audio format only (telephone).  All issues noted in this document were discussed and addressed.  No physical exam could be performed with this format.  Please refer to the patient's chart for his  consent to telehealth for [facility].  ? ?I connected with  Stanley Mclean on 07/30/21 by a video enabled telemedicine application and verified that I am speaking with the correct person using two identifiers. ?I am contacting the patient above from our cardiology clinic office or alternate office work station to their home, ?I discussed the limitations of evaluation and management by telemedicine. The patient expressed understanding and agreed to proceed.  ? ?Evaluation Performed:  Follow-up visit ? ?Date:  07/30/2021  ? ?ID:  Stanley Mclean, DOB 23-Jun-1940, MRN [id number] ? ?Patient Location:  ?[place] ?[place] [id number]  ? ?Provider location:   ?[place], [employer] office ? ?PCP:  Olin Hauser, DO  ?Cardiologist:  [facility] ? ?Chief Complaint  ?Patient presents with  ? 6 month follow up  ?  Patient c/o shortness of breath & bilateral LE edema. Medications reviewed by the patient verbally.   ? ? ?History of Present Illness:   ? ?Stanley Mclean is a 81 y.o. male who presents via audio/video conferencing for a telehealth visit today.   ?The patient does not symptoms concerning for COVID-19 infection  (fever, chills, cough, or new SHORTNESS OF BREATH).  ? ?Last seen in clinic by myself October 2022 ? ?Patient has a past medical history of ?Stanley Mclean is a pleasant 81 year old gentleman with a  ?CVA ?DM II ?long smoking history who continues to smoke one pack per day,  ?coronary artery disease, ?stent to his mid LAD, 2 stents to  proximal LCX in May 2008, stent  proximal RCA July 2008, previous anginal symptoms  ?amputation of the right lower extremity from lawnmower accident 2 years ago ?who presents for routine followup of his coronary artery disease ? ?LOV 10/22 ? ?Seen by pulmonary December 2022 ?Echo ordered and completed 1/23, results discussed today ?Left ventricular ejection fraction, by estimation, is 50 to 55%. ?The estimated right ventricular systolic  ?pressure is 78.2 mmHg.  ? ?Reported having ankle swelling several months ago ?"Aleve caused leg swelling" ?Reports he stopped the NSAIDs and ankle swelling resolved, now not an issue ? ?No chest pain ?Still smoking, 1 pack/day  ?has chronic shortness of breath ?Some days better than others ? ?Prior CT scan reviewed, lung nodules being monitored ?Has not set up repeat CT scan per pulmonary ? ?On prior clinic visits reports chronic hypoxia ?Lives in the mid to low 80s, ?Denies active bronchitis ?  ?Limited by chronic back pain and shortness of breath ?Does not get out much ?  ?Previously declined cardiac catheterization for symptoms of chest pain ?  ?Lab work reviewed with him on today's discussion ? HBA1C 7.4 ?  ?Other past medical history reviewed ?CVA  03/2016  ?  left arm deficits, ?Still only at 80% to 90% in terms of left hand strength ?  ?MRI results  ?Acute 1 cm nonhemorrhagic deep white matter infarct, RIGHT hemisphere. ?Atrophy and small vessel disease. ?Apparent high-grade stenosis of the vertical petrous segment RIGHT ICA, likely focal atherosclerosis. ?Intracranial atherosclerotic disease, most notably affecting the RIGHT greater than LEFT M2 and  M3 MCA branches. ?  ?CT neck  ?Plaque right carotid bifurcation with 64% maximal diameter stenosis at the junction of the right common carotid artery and proximal right internal carotid artery. ?  ?Plaque pre cavernous and cavernous segment right internal carotid artery with moderate narrowing. ?  ?Calcified plaque left carotid bifurcation. Calcified and noncalcified plaque proximal left internal carotid artery. Less than 60% diameter stenosis. ?  ?Calcified plaque left internal carotid artery cavernous segment with mild narrowing. ?  ?Cath by Dr. Ronalee Belts performed showing ?  50% RICA with normal distal right ICA ?  ? cardiac catheterization 06/06/2013, found to have severe distal RCA disease, mild in-stent restenosis in the circumflex and LAD, normal ejection fraction. 40% mid LAD disease, 40% B1 disease, 30% proximal circumflex also noted. He had Xience stent 2.5 x 15 mm placed in the distal RCA.  ?  ?History of neuropathy. ? ? ? ?Prior CV studies:   ?The following studies were reviewed today: ? ? ? ?Past Medical History:  ?Diagnosis Date  ? Anemia   ? Basal cell carcinoma   ? BPH (benign prostatic hyperplasia)   ? CAD (coronary artery disease)   ? a. s/p multiple prior PCIs b. s/p DES-distal RCA 05/2013  ? COPD (chronic obstructive pulmonary disease) ([place])   ? GERD (gastroesophageal reflux disease)   ? Macular degeneration   ? Pure hypercholesterolemia   ? Stroke [facility])   ? Tobacco abuse   ? Traumatic amputation of leg(s) (complete) (partial), unilateral, level not specified, without mention of complication 84/53/6468  ? Right Below Knee Amputation  ? Unspecified essential hypertension   ? ?Past Surgical History:  ?Procedure Laterality Date  ? AMPUTATION Right 08/26/2011  ? Below Knee Amputee  ? APPENDECTOMY    ? CARDIAC CATHETERIZATION    ? CAROTID ANGIOGRAPHY Right 05/14/2016  ? Procedure: Carotid Angiography;  Surgeon: Katha Cabal, MD;  Location: [place] [facility];  Service: Cardiovascular;  Laterality:  Right;  ? CAROTID PTA/STENT INTERVENTION  05/14/2016  ? Procedure: Carotid PTA/Stent Intervention;  Surgeon: Katha Cabal, MD;  Location: [place] [facility];  Service: Cardiovascular;;  ? CORONARY ANGIOPLASTY WITH STENT PLACEMENT  09/28/2006  ? RCA  ? CORONARY ANGIOPLASTY WITH STENT PLACEMENT  07/21/2006  ? Mid LAD  ? CORONARY ANGIOPLASTY WITH STENT PLACEMENT  06/06/2013  ? 30% prox LAD at the site of a prior stent, 40% mid LAD at the origin of D1, 40% ostial D1, 30% prox LCx at the site of a prior stent, in the proximal third of the vessel segment, 20% prox RCA, 99% distal RCA s/p DES. EF >55%.  ?  ? ? ?Allergies:   Patient has no known allergies.  ? ?Social History  ? ?Tobacco Use  ? Smoking status: Every Day  ?  Packs/day: 1.00  ?  Years: 60.00  ?  Pack years: 60.00  ?  Types: Cigarettes  ?  Last attempt to quit: 03/29/2016  ?  Years since quitting: 5.3  ? Smokeless tobacco: Never  ? Tobacco comments:  ?  1 pack will last 1 week--03/05/21  ?Vaping Use  ? Vaping Use:  Never used  ?Substance Use Topics  ? Alcohol use: No  ? Drug use: No  ?  ? ?Current Outpatient Medications on File Prior to Visit  ?Medication Sig Dispense Refill  ? albuterol (PROVENTIL HFA;VENTOLIN HFA) 108 (90 Base) MCG/ACT inhaler Inhale 2 puffs into the lungs every 6 (six) hours as needed for wheezing or shortness of breath. 6 Inhaler 1  ? aspirin EC 81 MG tablet Take 1 tablet (81 mg total) by mouth daily. 90 tablet 3  ? atorvastatin (LIPITOR) 40 MG tablet TAKE 1 TABLET BY MOUTH EVERY DAY 90 tablet 0  ? Budeson-Glycopyrrol-Formoterol (BREZTRI AEROSPHERE) 160-9-4.8 MCG/ACT AERO Inhale 2 puffs into the lungs in the morning and at bedtime. 32.1 g 0  ? metFORMIN (GLUCOPHAGE) 500 MG tablet Take 500 mg by mouth daily with breakfast.    ? metoprolol tartrate (LOPRESSOR) 50 MG tablet TAKE 1 TABLET BY MOUTH TWICE A DAY 180 tablet 0  ? nitroGLYCERIN (NITROSTAT) 0.4 MG SL tablet Place 1 tablet (0.4 mg total) under the tongue every 5 (five) minutes as  needed for chest pain. 25 tablet 0  ? terazosin (HYTRIN) 1 MG capsule TAKE 1 CAPSULE BY MOUTH EVERYDAY AT BEDTIME 30 capsule 0  ? albuterol (VENTOLIN HFA) 108 (90 Base) MCG/ACT inhaler Inhale 2 puffs into the

## 2021-07-29 NOTE — Telephone Encounter (Signed)
?  Patient Consent for Virtual Visit  ? ? ?   ? ?Stanley Mclean has provided verbal consent on 07/29/2021 for a virtual visit (video or telephone). ? ? ?CONSENT FOR VIRTUAL VISIT FOR:  Stanley Mclean  ?By participating in this virtual visit I agree to the following: ? ?I hereby voluntarily request, consent and authorize [facility] and its employed or contracted physicians, physician assistants, nurse practitioners or other licensed health care professionals (the Practitioner), to provide me with telemedicine health care services (the ?Services") as deemed necessary by the treating Practitioner. I acknowledge and consent to receive the Services by the Practitioner via telemedicine. I understand that the telemedicine visit will involve communicating with the Practitioner through live audiovisual communication technology and the disclosure of certain medical information by electronic transmission. I acknowledge that I have been given the opportunity to request an in-person assessment or other available alternative prior to the telemedicine visit and am voluntarily participating in the telemedicine visit. ? ?I understand that I have the right to withhold or withdraw my consent to the use of telemedicine in the course of my care at any time, without affecting my right to future care or treatment, and that the Practitioner or I may terminate the telemedicine visit at any time. I understand that I have the right to inspect all information obtained and/or recorded in the course of the telemedicine visit and may receive copies of available information for a reasonable fee.  I understand that some of the potential risks of receiving the Services via telemedicine include:  ?Delay or interruption in medical evaluation due to technological equipment failure or disruption; ?Information transmitted may not be sufficient (e.g. poor resolution of images) to allow for appropriate medical decision making by the Practitioner;  and/or  ?In rare instances, security protocols could fail, causing a breach of personal health information. ? ?Furthermore, I acknowledge that it is my responsibility to provide information about my medical history, conditions and care that is complete and accurate to the best of my ability. I acknowledge that Practitioner's advice, recommendations, and/or decision may be based on factors not within their control, such as incomplete or inaccurate data provided by me or distortions of diagnostic images or specimens that may result from electronic transmissions. I understand that the practice of medicine is not an exact science and that Practitioner makes no warranties or guarantees regarding treatment outcomes. I acknowledge that a copy of this consent can be made available to me via my patient portal ([place]), or I can request a printed copy by calling the office of [facility].   ? ?I understand that my insurance will be billed for this visit.  ? ?I have read or had this consent read to me. ?I understand the contents of this consent, which adequately explains the benefits and risks of the Services being provided via telemedicine.  ?I have been provided ample opportunity to ask questions regarding this consent and the Services and have had my questions answered to my satisfaction. ?I give my informed consent for the services to be provided through the use of telemedicine in my medical care ? ? ? ?

## 2021-07-30 ENCOUNTER — Encounter: Payer: Self-pay | Admitting: Cardiovascular Disease

## 2021-07-30 ENCOUNTER — Telehealth (INDEPENDENT_AMBULATORY_CARE_PROVIDER_SITE_OTHER): Payer: PPO | Admitting: Cardiovascular Disease

## 2021-07-30 VITALS — BP 136/78 | HR 60 | Ht 68.0 in | Wt 123.0 lb

## 2021-07-30 DIAGNOSIS — J432 Centrilobular emphysema: Secondary | ICD-10-CM | POA: Diagnosis not present

## 2021-07-30 DIAGNOSIS — I2723 Pulmonary hypertension due to lung diseases and hypoxia: Secondary | ICD-10-CM | POA: Diagnosis not present

## 2021-07-30 DIAGNOSIS — J449 Chronic obstructive pulmonary disease, unspecified: Secondary | ICD-10-CM | POA: Diagnosis not present

## 2021-07-30 DIAGNOSIS — I10 Essential (primary) hypertension: Secondary | ICD-10-CM | POA: Diagnosis not present

## 2021-07-30 DIAGNOSIS — I272 Pulmonary hypertension, unspecified: Secondary | ICD-10-CM | POA: Diagnosis not present

## 2021-07-30 DIAGNOSIS — I25118 Atherosclerotic heart disease of native coronary artery with other forms of angina pectoris: Secondary | ICD-10-CM

## 2021-07-30 DIAGNOSIS — E782 Mixed hyperlipidemia: Secondary | ICD-10-CM | POA: Diagnosis not present

## 2021-07-30 DIAGNOSIS — Z9981 Dependence on supplemental oxygen: Secondary | ICD-10-CM | POA: Diagnosis not present

## 2021-07-30 DIAGNOSIS — F17200 Nicotine dependence, unspecified, uncomplicated: Secondary | ICD-10-CM

## 2021-07-30 DIAGNOSIS — Z89511 Acquired absence of right leg below knee: Secondary | ICD-10-CM | POA: Diagnosis not present

## 2021-07-30 DIAGNOSIS — I6523 Occlusion and stenosis of bilateral carotid arteries: Secondary | ICD-10-CM | POA: Diagnosis not present

## 2021-07-30 DIAGNOSIS — R0902 Hypoxemia: Secondary | ICD-10-CM | POA: Diagnosis not present

## 2021-07-30 MED ORDER — FUROSEMIDE 20 MG PO TABS: 20.0000 mg | ORAL_TABLET | Freq: Every day | ORAL | 3 refills | Status: AC | PRN

## 2021-07-30 NOTE — Patient Instructions (Addendum)
We will attach the phone number to scheduling in radiology when you are ready to schedule your CT scan chest ordered by Dr. Patsey Berthold: [phone] ? ? ?Medication Instructions:  ?Lasix /furosemide 20 mg daily as needed ? ?If you need a refill on your cardiac medications before your next appointment, please call your pharmacy.  ? ?Lab work: ?No new labs needed ? ?Testing/Procedures: ?No new testing needed ? ?Follow-Up: ?At [facility], you and your health needs are our priority.  As part of our continuing mission to provide you with exceptional heart care, we have created designated Provider Care Teams.  These Care Teams include your primary Cardiologist (physician) and Advanced Practice Providers (APPs -  Physician Assistants and Nurse Practitioners) who all work together to provide you with the care you need, when you need it. ? ?You will need a follow up appointment in 6 months ? ?Providers on your designated Care Team:   ?Murray Hodgkins, NP ?Christell Faith, PA-C ?Cadence Kathlen Mody, PA-C ? ?COVID-19 Vaccine Information can be found at: [email or web address] For questions related to vaccine distribution or appointments, please email [email or web address] or call [phone].  ? ?

## 2021-07-31 NOTE — Telephone Encounter (Signed)
Requested medication (s) are due for refill today: No. Completed course ? ?Requested medication (s) are on the active medication list: no   ? ?Last refill: 03/04/21  #13  0 refills ? ?Future visit scheduled no ? ?Notes to clinic:Completed course, cannot refuse non-delegated meds per protocol. ? ?Requested Prescriptions  ?Pending Prescriptions Disp Refills  ? predniSONE (DELTASONE) 20 MG tablet [Pharmacy Med Name: PREDNISONE 20 MG TABLET] 13 tablet 0  ?  Sig: Take daily with food. Start with 91m (3 pills) x 2 days, then reduce to 423m(2 pills) x 2 days, then 2052m1 pill) x 3 days  ?  ? Not Delegated - Endocrinology:  Oral Corticosteroids Failed - 07/29/2021  7:05 PM  ?  ?  Failed - This refill cannot be delegated  ?  ?  Failed - Manual Review: Eye exam for IOP if prolonged treatment  ?  ?  Failed - Glucose (serum) in normal range and within 180 days  ?  Glucose  ?Date Value Ref Range Status  ?07/07/2017 218  Final  ?06/07/2013 97 65 - 99 mg/dL Final  ? ?Glucose, Bld  ?Date Value Ref Range Status  ?02/22/2021 286 (H) 70 - 99 mg/dL Final  ?  Comment:  ?  Glucose reference range applies only to samples taken after fasting for at least 8 hours.  ? ?Glucose-Capillary  ?Date Value Ref Range Status  ?02/24/2021 146 (H) 70 - 99 mg/dL Final  ?  Comment:  ?  Glucose reference range applies only to samples taken after fasting for at least 8 hours.  ?   ?  ?  Failed - Na in normal range and within 180 days  ?  Sodium  ?Date Value Ref Range Status  ?02/22/2021 134 (L) 135 - 145 mmol/L Final  ?07/07/2017 140 137 - 147 Final  ?06/07/2013 141 136 - 145 mmol/L Final  ?   ?  ?  Failed - Bone Mineral Density or Dexa Scan completed in the last 2 years  ?  ?  Passed - K in normal range and within 180 days  ?  Potassium  ?Date Value Ref Range Status  ?02/22/2021 4.3 3.5 - 5.1 mmol/L Final  ?  Comment:  ?  HEMOLYSIS AT THIS LEVEL MAY AFFECT RESULT  ?06/07/2013 3.1 (L) 3.5 - 5.1 mmol/L Final  ?   ?  ?  Passed - Last BP in normal range  ?   BP Readings from Last 1 Encounters:  ?07/30/21 136/78  ?   ?  ?  Passed - Valid encounter within last 6 months  ?  Recent Outpatient Visits   ? ?      ? 4 months [place]  ? 2 years [place]-he[place]  [place] [facility] [place]  ? [facility] [place] [place]  ? 3 years ago Chronic bilateral low back pain with [place]-sided sciatica  [place]  ? 4 years ago Acute left-sided low back pain without sciatica  ? [facility]  ? ?  ?  ? ? ?  ?  ?  ? ? ? ? ?

## 2021-08-10 DIAGNOSIS — J9601 Acute respiratory failure with hypoxia: Secondary | ICD-10-CM | POA: Diagnosis not present

## 2021-08-10 DIAGNOSIS — J441 Chronic obstructive pulmonary disease with (acute) exacerbation: Secondary | ICD-10-CM | POA: Diagnosis not present

## 2021-08-22 ENCOUNTER — Other Ambulatory Visit: Payer: Self-pay | Admitting: Cardiovascular Disease

## 2021-08-22 NOTE — Telephone Encounter (Signed)
Please advise if ok to refill.

## 2021-10-24 DIAGNOSIS — Z89511 Acquired absence of right leg below knee: Secondary | ICD-10-CM | POA: Diagnosis not present

## 2021-10-24 DIAGNOSIS — E46 Unspecified protein-calorie malnutrition: Secondary | ICD-10-CM | POA: Diagnosis not present

## 2021-10-24 DIAGNOSIS — Z681 Body mass index (BMI) 19 or less, adult: Secondary | ICD-10-CM | POA: Diagnosis not present

## 2021-10-24 DIAGNOSIS — I10 Essential (primary) hypertension: Secondary | ICD-10-CM | POA: Diagnosis not present

## 2021-10-24 DIAGNOSIS — Z9981 Dependence on supplemental oxygen: Secondary | ICD-10-CM | POA: Diagnosis not present

## 2021-10-24 DIAGNOSIS — J449 Chronic obstructive pulmonary disease, unspecified: Secondary | ICD-10-CM | POA: Diagnosis not present

## 2021-10-30 ENCOUNTER — Other Ambulatory Visit: Payer: Self-pay | Admitting: Cardiovascular Disease

## 2022-01-17 DIAGNOSIS — Z7951 Long term (current) use of inhaled steroids: Secondary | ICD-10-CM | POA: Diagnosis not present

## 2022-01-17 DIAGNOSIS — Z681 Body mass index (BMI) 19 or less, adult: Secondary | ICD-10-CM | POA: Diagnosis not present

## 2022-01-17 DIAGNOSIS — Z89511 Acquired absence of right leg below knee: Secondary | ICD-10-CM | POA: Diagnosis not present

## 2022-01-17 DIAGNOSIS — J449 Chronic obstructive pulmonary disease, unspecified: Secondary | ICD-10-CM | POA: Diagnosis not present

## 2022-01-17 DIAGNOSIS — J9611 Chronic respiratory failure with hypoxia: Secondary | ICD-10-CM | POA: Diagnosis not present

## 2022-01-17 DIAGNOSIS — D692 Other nonthrombocytopenic purpura: Secondary | ICD-10-CM | POA: Diagnosis not present

## 2022-01-17 DIAGNOSIS — Z515 Encounter for palliative care: Secondary | ICD-10-CM | POA: Diagnosis not present

## 2022-01-17 DIAGNOSIS — Z9981 Dependence on supplemental oxygen: Secondary | ICD-10-CM | POA: Diagnosis not present

## 2022-01-26 NOTE — Progress Notes (Unsigned)
Virtual Visit via Telephone Note   This visit type was conducted due to national recommendations for restrictions regarding the COVID-19 Pandemic (e.g. social distancing) in an effort to limit this patient's exposure and mitigate transmission in our community.  Due to his co-morbid illnesses, this patient is at least at moderate risk for complications without adequate follow up.  This format is felt to be most appropriate for this patient at this time.  The patient did not have access to video technology/had technical difficulties with video requiring transitioning to audio format only (telephone).  All issues noted in this document were discussed and addressed.  No physical exam could be performed with this format.  Please refer to the patient's chart for his  consent to telehealth for [facility].   I connected with  Stanley Mclean on 01/27/22 by a video enabled telemedicine application and verified that I am speaking with the correct person using two identifiers. I am contacting the patient above from our cardiology clinic office or alternate office work station to their home, I discussed the limitations of evaluation and management by telemedicine. The patient expressed understanding and agreed to proceed.   Evaluation Performed:  Follow-up visit  Date:  01/27/2022   ID:  Stanley Mclean, DOB May 26, 1940, MRN [id number]  Patient Location:  [place] [id number]   Provider location:   [facility] [place] office  PCP:  Stanley Hauser, DO  Cardiologist:  Stanley Mclean  Chief Complaint  Patient presents with   6 month follow up     "Doing well." Medications reviewed by the patient verbally.     History of Present Illness:    Stanley Mclean is a 81 y.o. male who presents via audio/video conferencing for a telehealth visit today.   The patient does not symptoms concerning for COVID-19 infection (fever, chills, cough, or new SHORTNESS OF  BREATH).   Patient has a past medical history of Stanley Mclean is a pleasant 81 year old gentleman with a  CVA DM II long smoking history who continues to smoke one pack per day,  coronary artery disease, stent to his mid LAD, 2 stents to  proximal LCX in May 2008, stent  proximal RCA July 2008, previous anginal symptoms  amputation of the right lower extremity from lawnmower accident 2 years ago who presents for routine followup of his coronary artery disease  Last seen in clinic by myself October 2022 LOV by video visit May 2023 On discussion today he reports that he is doing relatively well [facility] For blood pressure taking amlodipine 5, lisinopril 20, metoprolol twice daily Blood pressure typically well controlled at home Chronic sob, long smoking history Using supplement oxygen at home, 4 liters at baseline On prior clinic visits reports chronic hypoxia Lives in the mid to low 80s,  No chest pain on exertion  No significant lower extremity edema Previously with leg swelling on NSAIDs  Echo January 2023 reviewed Left ventricular ejection fraction, by estimation, is 50 to 55%. The estimated right ventricular systolic  pressure is 92.9 mmHg.   CT scan chest January 2023 Nodule noted, repeat CT scan was recommended  Denies active bronchitis   Limited by chronic back pain and shortness of breath Does not get out much   Previously declined cardiac catheterization for symptoms of chest pain   Lab work reviewed with him on today's discussion  HBA1C 7.4  Other past medical history reviewed  CVA  03/2016  left arm deficits, Still only at 80% to 90% in terms of left hand strength   MRI results  Acute 1 cm nonhemorrhagic deep white matter infarct, RIGHT hemisphere. Atrophy and small vessel disease. Apparent high-grade stenosis of the vertical petrous segment RIGHT ICA, likely focal atherosclerosis. Intracranial atherosclerotic disease, most notably  affecting the RIGHT greater than LEFT M2 and M3 MCA branches.   CT neck  Plaque right carotid bifurcation with 64% maximal diameter stenosis at the junction of the right common carotid artery and proximal right internal carotid artery.   Plaque pre cavernous and cavernous segment right internal carotid artery with moderate narrowing.   Calcified plaque left carotid bifurcation. Calcified and noncalcified plaque proximal left internal carotid artery. Less than 60% diameter stenosis.   Calcified plaque left internal carotid artery cavernous segment with mild narrowing.   Cath by Dr. Ronalee Mclean performed showing   50% RICA with normal distal right ICA    cardiac catheterization 06/06/2013, found to have severe distal RCA disease, mild in-stent restenosis in the circumflex and LAD, normal ejection fraction. 40% mid LAD disease, 40% B1 disease, 30% proximal circumflex also noted. He had Xience stent 2.5 x 15 mm placed in the distal RCA.    History of neuropathy.   Past Medical History:  Diagnosis Date   Anemia    Basal cell carcinoma    BPH (benign prostatic hyperplasia)    CAD (coronary artery disease)    a. s/p multiple prior PCIs b. s/p DES-distal RCA 05/2013   COPD (chronic obstructive pulmonary disease) (HCC)    GERD (gastroesophageal reflux disease)    Macular degeneration    Pure hypercholesterolemia    Stroke ([place])    Tobacco abuse    Traumatic amputation of leg(s) (complete) (partial), unilateral, level not specified, without mention of complication 92/33/0076   Right Below Knee Amputation   Unspecified essential hypertension    Past Surgical History:  Procedure Laterality Date   AMPUTATION Right 08/26/2011   Below Knee Amputee   APPENDECTOMY     CARDIAC CATHETERIZATION     CAROTID ANGIOGRAPHY Right 05/14/2016   Procedure: Carotid Angiography;  Surgeon: Stanley Cabal, MD;  Location: [facility];  Service: Cardiovascular;  Laterality: Right;   CAROTID PTA/STENT  INTERVENTION  05/14/2016   Procedure: Carotid PTA/Stent Intervention;  Surgeon: Stanley Cabal, MD;  Location: [facility];  Service: Cardiovascular;;   CORONARY ANGIOPLASTY WITH STENT PLACEMENT  09/28/2006   RCA   CORONARY ANGIOPLASTY WITH STENT PLACEMENT  07/21/2006   Mid LAD   CORONARY ANGIOPLASTY WITH STENT PLACEMENT  06/06/2013   30% prox LAD at the site of a prior stent, 40% mid LAD at the origin of D1, 40% ostial D1, 30% prox LCx at the site of a prior stent, in the proximal third of the vessel segment, 20% prox RCA, 99% distal RCA s/p DES. EF >55%.      Allergies:   Patient has no known allergies.   Social History   Tobacco Use   Smoking status: Every Day    Packs/day: 1.00    Years: 60.00    Total pack years: 60.00    Types: Cigarettes    Last attempt to quit: 03/29/2016    Years since quitting: 5.8   Smokeless tobacco: Never   Tobacco comments:    1 pack will last 1 week--03/05/21  Vaping Use   Vaping Use: Never used  Substance Use Topics   Alcohol use:  No   Drug use: No     Current Outpatient Medications on File Prior to Visit  Medication Sig Dispense Refill   albuterol (PROVENTIL HFA;VENTOLIN HFA) 108 (90 Base) MCG/ACT inhaler Inhale 2 puffs into the lungs every 6 (six) hours as needed for wheezing or shortness of breath. 6 Inhaler 1   amLODipine (NORVASC) 10 MG tablet TAKE ONE-HALF TABLET BY MOUTH EVERY DAY FOR BLOOD PRESSURE     aspirin EC 81 MG tablet Take 1 tablet (81 mg total) by mouth daily. 90 tablet 3   atorvastatin (LIPITOR) 40 MG tablet TAKE 1 TABLET BY MOUTH EVERY DAY 90 tablet 0   Budeson-Glycopyrrol-Formoterol (BREZTRI AEROSPHERE) 160-9-4.8 MCG/ACT AERO Inhale 2 puffs into the lungs in the morning and at bedtime. 32.1 g 0   Cholecalciferol 50 MCG (2000 UT) TABS Take 1 tablet by mouth daily.     furosemide (LASIX) 20 MG tablet Take 1 tablet (20 mg total) by mouth daily as needed. 90 tablet 3   ipratropium-albuterol (DUONEB) 0.5-2.5 (3) MG/3ML  SOLN USE 1 NEBULIZER SOLUTION (3ML) BY ORAL INHALATION EVERY 6 HOURS AS NEEDED FOR SHORTNESS OF BREATH     ketoconazole (NIZORAL) 2 % cream APPLY SUFFICIENT AMOUNT TOPICALLY TWO TIMES A DAY TO RASH ON FOOT. USE FOR 2 WEEKS, THEN DISCONTINUE     lisinopril (ZESTRIL) 40 MG tablet TAKE ONE-HALF TABLET BY MOUTH EVERY DAY FOR BLOOD PRESSURE     metFORMIN (GLUCOPHAGE) 500 MG tablet Take 1,000 mg by mouth 2 (two) times daily with a meal.     metoprolol tartrate (LOPRESSOR) 50 MG tablet TAKE 1 TABLET BY MOUTH TWICE A DAY 180 tablet 0   nitroGLYCERIN (NITROSTAT) 0.4 MG SL tablet Place 1 tablet (0.4 mg total) under the tongue every 5 (five) minutes as needed for chest pain. 25 tablet 0   predniSONE (DELTASONE) 5 MG tablet Take 1 tablet by mouth daily.     tamsulosin (FLOMAX) 0.4 MG CAPS capsule TAKE ONE CAPSULE BY MOUTH EVERY EVENING TO REPLACE TERAZOSIN     terazosin (HYTRIN) 1 MG capsule TAKE 1 CAPSULE BY MOUTH EVERYDAY AT BEDTIME 90 capsule 3   albuterol (VENTOLIN HFA) 108 (90 Base) MCG/ACT inhaler Inhale 2 puffs into the lungs every 6 (six) hours as needed for wheezing or shortness of breath. (Patient not taking: Reported on 07/30/2021) 20.1 g 0   ipratropium (ATROVENT) 0.06 % nasal spray Place 2 sprays into both nostrils 4 (four) times daily. (Patient not taking: Reported on 07/30/2021) 45 mL 0   No current facility-administered medications on file prior to visit.     Family Hx: The patient's family history includes Mental illness in his mother.  ROS:   Please see the history of present illness.    Review of Systems  Constitutional: Negative.   HENT: Negative.    Respiratory: Negative.    Cardiovascular: Negative.   Gastrointestinal: Negative.   Musculoskeletal: Negative.   Neurological: Negative.   Psychiatric/Behavioral: Negative.    All other systems reviewed and are negative.     Labs/Other Tests and Data Reviewed:    Recent Labs: 02/22/2021: BUN 20; Creatinine, Ser 0.94; Hemoglobin  15.6; Platelets 210; Potassium 4.3; Sodium 134   Recent Lipid Panel Lab Results  Component Value Date/Time   CHOL 123 07/07/2017 12:00 AM   CHOL 166 12/29/2013 10:33 AM   CHOL 175 06/01/2013 11:33 AM   TRIG 131 07/07/2017 12:00 AM   TRIG 211 (H) 06/01/2013 11:33 AM   HDL 61 07/07/2017 12:00 AM  HDL 43 12/29/2013 10:33 AM   HDL 39 (L) 06/01/2013 11:33 AM   CHOLHDL 3.3 03/30/2016 05:59 AM   LDLCALC 61 07/07/2017 12:00 AM   LDLCALC 69 12/29/2013 10:33 AM   LDLCALC 94 06/01/2013 11:33 AM    Wt Readings from Last 3 Encounters:  01/27/22 122 lb (55.3 kg)  07/30/21 123 lb (55.8 kg)  03/05/21 119 lb (54 kg)     Exam:    Vital Signs: Vital signs may also be detailed in the HPI BP (!) 140/83   Pulse 75   Ht _0  (1.753 m)   Wt 122 lb (55.3 kg)   BMI 18.02 kg/m   Wt Readings from Last 3 Encounters:  01/27/22 122 lb (55.3 kg)  07/30/21 123 lb (55.8 kg)  03/05/21 119 lb (54 kg)   Temp Readings from Last 3 Encounters:  03/05/21 (!) 97.3 F (36.3 C) (Temporal)  02/24/21 98.6 F (37 C) (Oral)  07/26/19 (!) 97.3 F (36.3 C) (Temporal)   BP Readings from Last 3 Encounters:  01/27/22 (!) 140/83  07/30/21 136/78  07/01/21 139/67   Pulse Readings from Last 3 Encounters:  01/27/22 75  07/30/21 60  03/05/21 67     Constitutional:  oriented to person, place, and time. No distress.  HENT:  Head: Normocephalic and atraumatic.   ASSESSMENT & PLAN:    Problem List Items Addressed This Visit       Cardiology Problems   CAD (coronary artery disease) - Primary   Relevant Medications   amLODipine (NORVASC) 10 MG tablet   lisinopril (ZESTRIL) 40 MG tablet   Essential hypertension   Relevant Medications   amLODipine (NORVASC) 10 MG tablet   lisinopril (ZESTRIL) 40 MG tablet   Carotid artery stenosis   Relevant Medications   amLODipine (NORVASC) 10 MG tablet   lisinopril (ZESTRIL) 40 MG tablet   Hyperlipidemia   Relevant Medications   amLODipine (NORVASC) 10 MG tablet    lisinopril (ZESTRIL) 40 MG tablet     Other   Centrilobular emphysema (HCC)   Relevant Medications   predniSONE (DELTASONE) 5 MG tablet   ipratropium-albuterol (DUONEB) 0.5-2.5 (3) MG/3ML SOLN   Smoker   Other Visit Diagnoses     Pulmonary hypertension (HCC)       Relevant Medications   amLODipine (NORVASC) 10 MG tablet   lisinopril (ZESTRIL) 40 MG tablet   Chronic obstructive pulmonary disease, unspecified COPD type (HCC)       Relevant Medications   predniSONE (DELTASONE) 5 MG tablet   ipratropium-albuterol (DUONEB) 0.5-2.5 (3) MG/3ML SOLN   Hypoxia         Chronic shortness of breath/COPD/pulmonary hypertension Continues to smoke 1 pack/day Known emphysema On chronic oxygen 4 L Lung nodules followed by pulmonary  Smoker Cessation recommended  CAD with stable angina Currently with no symptoms of angina. No further workup at this time. Continue current medication regimen. High risk of underlying ischemia Ejection fraction low normal 50 to 55%  Hyperlipidemia Stressed importance of staying on his Lipitor Goal LDL less than 70  COVID-19 Education: The signs and symptoms of COVID-19 were discussed with the patient and how to seek care for testing (follow up with PCP or arrange E-visit).  The importance of social distancing was discussed today.  Patient Risk:   After full review of this patients clinical status, I feel that they are at least moderate risk at this time.  Time:   Today, I have spent 30 minutes with the patient with  telehealth technology discussing the cardiac and medical problems/diagnoses detailed above   Additional 10 min spent reviewing the chart prior to patient visit today   Medication Adjustments/Labs and Tests Ordered: Current medicines are reviewed at length with the patient today.  Concerns regarding medicines are outlined above.   Tests Ordered: No tests ordered   Medication Changes: No changes made   Disposition: Follow-up in 12  months   Signed, Ida Rogue, MD  [place] Office [address], [place], [place] [address]

## 2022-01-27 ENCOUNTER — Encounter: Payer: Self-pay | Admitting: Cardiovascular Disease

## 2022-01-27 ENCOUNTER — Ambulatory Visit: Payer: PPO | Attending: Cardiovascular Disease | Admitting: Cardiovascular Disease

## 2022-01-27 VITALS — BP 140/83 | HR 75 | Ht 69.0 in | Wt 122.0 lb

## 2022-01-27 DIAGNOSIS — I6523 Occlusion and stenosis of bilateral carotid arteries: Secondary | ICD-10-CM

## 2022-01-27 DIAGNOSIS — J449 Chronic obstructive pulmonary disease, unspecified: Secondary | ICD-10-CM

## 2022-01-27 DIAGNOSIS — R0902 Hypoxemia: Secondary | ICD-10-CM | POA: Diagnosis not present

## 2022-01-27 DIAGNOSIS — I25118 Atherosclerotic heart disease of native coronary artery with other forms of angina pectoris: Secondary | ICD-10-CM | POA: Diagnosis not present

## 2022-01-27 DIAGNOSIS — E782 Mixed hyperlipidemia: Secondary | ICD-10-CM | POA: Diagnosis not present

## 2022-01-27 DIAGNOSIS — F17200 Nicotine dependence, unspecified, uncomplicated: Secondary | ICD-10-CM | POA: Diagnosis not present

## 2022-01-27 DIAGNOSIS — I10 Essential (primary) hypertension: Secondary | ICD-10-CM

## 2022-01-27 DIAGNOSIS — I272 Pulmonary hypertension, unspecified: Secondary | ICD-10-CM | POA: Diagnosis not present

## 2022-01-27 DIAGNOSIS — J432 Centrilobular emphysema: Secondary | ICD-10-CM

## 2022-01-27 NOTE — Patient Instructions (Signed)
Medication Instructions:  No changes  If you need a refill on your cardiac medications before your next appointment, please call your pharmacy.   Lab work: No new labs needed  Testing/Procedures: No new testing needed  Follow-Up: At [facility], you and your health needs are our priority.  As part of our continuing mission to provide you with exceptional heart care, we have created designated Provider Care Teams.  These Care Teams include your primary Cardiologist (physician) and Advanced Practice Providers (APPs -  Physician Assistants and Nurse Practitioners) who all work together to provide you with the care you need, when you need it.  You will need a follow up appointment in 12 months  Providers on your designated Care Team:   Murray Hodgkins, NP Christell Faith, PA-C Cadence Kathlen Mody, [place]  COVID-19 Vaccine Information can be found at: [email or web address] For questions related to vaccine distribution or appointments, please email [email or web address] or call [phone].

## 2022-02-28 ENCOUNTER — Inpatient Hospital Stay
Admission: EM | Admit: 2022-02-28 | Discharge: 2022-03-03 | DRG: 064 | Disposition: A | Discharge status: Home Health | Payer: PPO | Attending: Internal Medicine | Admitting: Internal Medicine

## 2022-02-28 ENCOUNTER — Other Ambulatory Visit: Payer: Self-pay

## 2022-02-28 ENCOUNTER — Inpatient Hospital Stay: Payer: PPO

## 2022-02-28 ENCOUNTER — Emergency Department: Payer: PPO

## 2022-02-28 DIAGNOSIS — I2510 Atherosclerotic heart disease of native coronary artery without angina pectoris: Secondary | ICD-10-CM | POA: Diagnosis not present

## 2022-02-28 DIAGNOSIS — J9611 Chronic respiratory failure with hypoxia: Secondary | ICD-10-CM | POA: Diagnosis present

## 2022-02-28 DIAGNOSIS — Z7984 Long term (current) use of oral hypoglycemic drugs: Secondary | ICD-10-CM

## 2022-02-28 DIAGNOSIS — G9341 Metabolic encephalopathy: Secondary | ICD-10-CM | POA: Diagnosis not present

## 2022-02-28 DIAGNOSIS — I63323 Cerebral infarction due to thrombosis of bilateral anterior cerebral arteries: Secondary | ICD-10-CM | POA: Diagnosis not present

## 2022-02-28 DIAGNOSIS — N281 Cyst of kidney, acquired: Secondary | ICD-10-CM | POA: Diagnosis not present

## 2022-02-28 DIAGNOSIS — T380X5A Adverse effect of glucocorticoids and synthetic analogues, initial encounter: Secondary | ICD-10-CM | POA: Diagnosis present

## 2022-02-28 DIAGNOSIS — Z1152 Encounter for screening for COVID-19: Secondary | ICD-10-CM

## 2022-02-28 DIAGNOSIS — R29701 NIHSS score 1: Secondary | ICD-10-CM | POA: Diagnosis present

## 2022-02-28 DIAGNOSIS — J189 Pneumonia, unspecified organism: Secondary | ICD-10-CM | POA: Diagnosis not present

## 2022-02-28 DIAGNOSIS — I700 Atherosclerosis of aorta: Secondary | ICD-10-CM | POA: Diagnosis not present

## 2022-02-28 DIAGNOSIS — Z955 Presence of coronary angioplasty implant and graft: Secondary | ICD-10-CM

## 2022-02-28 DIAGNOSIS — Z89511 Acquired absence of right leg below knee: Secondary | ICD-10-CM | POA: Diagnosis not present

## 2022-02-28 DIAGNOSIS — E119 Type 2 diabetes mellitus without complications: Secondary | ICD-10-CM | POA: Diagnosis not present

## 2022-02-28 DIAGNOSIS — I63332 Cerebral infarction due to thrombosis of left posterior cerebral artery: Secondary | ICD-10-CM | POA: Diagnosis not present

## 2022-02-28 DIAGNOSIS — J439 Emphysema, unspecified: Secondary | ICD-10-CM | POA: Diagnosis not present

## 2022-02-28 DIAGNOSIS — Z9981 Dependence on supplemental oxygen: Secondary | ICD-10-CM | POA: Diagnosis not present

## 2022-02-28 DIAGNOSIS — R4701 Aphasia: Secondary | ICD-10-CM | POA: Diagnosis present

## 2022-02-28 DIAGNOSIS — D84821 Immunodeficiency due to drugs: Secondary | ICD-10-CM | POA: Diagnosis present

## 2022-02-28 DIAGNOSIS — E1165 Type 2 diabetes mellitus with hyperglycemia: Secondary | ICD-10-CM | POA: Diagnosis present

## 2022-02-28 DIAGNOSIS — J432 Centrilobular emphysema: Secondary | ICD-10-CM | POA: Diagnosis not present

## 2022-02-28 DIAGNOSIS — F17210 Nicotine dependence, cigarettes, uncomplicated: Secondary | ICD-10-CM | POA: Diagnosis present

## 2022-02-28 DIAGNOSIS — Z85828 Personal history of other malignant neoplasm of skin: Secondary | ICD-10-CM | POA: Diagnosis not present

## 2022-02-28 DIAGNOSIS — R404 Transient alteration of awareness: Secondary | ICD-10-CM | POA: Diagnosis not present

## 2022-02-28 DIAGNOSIS — R4182 Altered mental status, unspecified: Principal | ICD-10-CM

## 2022-02-28 DIAGNOSIS — Z7951 Long term (current) use of inhaled steroids: Secondary | ICD-10-CM

## 2022-02-28 DIAGNOSIS — J441 Chronic obstructive pulmonary disease with (acute) exacerbation: Secondary | ICD-10-CM | POA: Diagnosis not present

## 2022-02-28 DIAGNOSIS — R7989 Other specified abnormal findings of blood chemistry: Secondary | ICD-10-CM | POA: Diagnosis present

## 2022-02-28 DIAGNOSIS — J440 Chronic obstructive pulmonary disease with acute lower respiratory infection: Secondary | ICD-10-CM | POA: Diagnosis present

## 2022-02-28 DIAGNOSIS — K219 Gastro-esophageal reflux disease without esophagitis: Secondary | ICD-10-CM | POA: Diagnosis present

## 2022-02-28 DIAGNOSIS — Z79899 Other long term (current) drug therapy: Secondary | ICD-10-CM

## 2022-02-28 DIAGNOSIS — R338 Other retention of urine: Secondary | ICD-10-CM | POA: Diagnosis not present

## 2022-02-28 DIAGNOSIS — I10 Essential (primary) hypertension: Secondary | ICD-10-CM | POA: Diagnosis not present

## 2022-02-28 DIAGNOSIS — I63532 Cerebral infarction due to unspecified occlusion or stenosis of left posterior cerebral artery: Secondary | ICD-10-CM | POA: Diagnosis not present

## 2022-02-28 DIAGNOSIS — H3530 Unspecified macular degeneration: Secondary | ICD-10-CM | POA: Diagnosis present

## 2022-02-28 DIAGNOSIS — N200 Calculus of kidney: Secondary | ICD-10-CM | POA: Diagnosis not present

## 2022-02-28 DIAGNOSIS — J9601 Acute respiratory failure with hypoxia: Secondary | ICD-10-CM | POA: Diagnosis not present

## 2022-02-28 DIAGNOSIS — E7800 Pure hypercholesterolemia, unspecified: Secondary | ICD-10-CM | POA: Diagnosis not present

## 2022-02-28 DIAGNOSIS — Z8673 Personal history of transient ischemic attack (TIA), and cerebral infarction without residual deficits: Secondary | ICD-10-CM

## 2022-02-28 DIAGNOSIS — A419 Sepsis, unspecified organism: Secondary | ICD-10-CM | POA: Diagnosis not present

## 2022-02-28 DIAGNOSIS — Z7952 Long term (current) use of systemic steroids: Secondary | ICD-10-CM

## 2022-02-28 DIAGNOSIS — I6389 Other cerebral infarction: Secondary | ICD-10-CM | POA: Diagnosis not present

## 2022-02-28 DIAGNOSIS — E785 Hyperlipidemia, unspecified: Secondary | ICD-10-CM | POA: Diagnosis present

## 2022-02-28 DIAGNOSIS — R000 Tachycardia, unspecified: Secondary | ICD-10-CM | POA: Diagnosis not present

## 2022-02-28 DIAGNOSIS — G546 Phantom limb syndrome with pain: Secondary | ICD-10-CM | POA: Diagnosis not present

## 2022-02-28 DIAGNOSIS — K5730 Diverticulosis of large intestine without perforation or abscess without bleeding: Secondary | ICD-10-CM | POA: Diagnosis not present

## 2022-02-28 DIAGNOSIS — R339 Retention of urine, unspecified: Secondary | ICD-10-CM | POA: Insufficient documentation

## 2022-02-28 DIAGNOSIS — N401 Enlarged prostate with lower urinary tract symptoms: Secondary | ICD-10-CM | POA: Diagnosis present

## 2022-02-28 DIAGNOSIS — J90 Pleural effusion, not elsewhere classified: Secondary | ICD-10-CM | POA: Diagnosis not present

## 2022-02-28 DIAGNOSIS — Z818 Family history of other mental and behavioral disorders: Secondary | ICD-10-CM

## 2022-02-28 DIAGNOSIS — Z7982 Long term (current) use of aspirin: Secondary | ICD-10-CM

## 2022-02-28 DIAGNOSIS — R0602 Shortness of breath: Secondary | ICD-10-CM | POA: Diagnosis present

## 2022-02-28 LAB — BLOOD GAS, VENOUS
Acid-Base Excess: 15 mmol/L — ABNORMAL HIGH (ref 0.0–2.0)
Bicarbonate: 42.8 mmol/L — ABNORMAL HIGH (ref 20.0–28.0)
O2 Saturation: 84.7 %
Patient temperature: 37
pCO2, Ven: 66 mmHg — ABNORMAL HIGH (ref 44–60)
pH, Ven: 7.42 (ref 7.25–7.43)
pO2, Ven: 52 mmHg — ABNORMAL HIGH (ref 32–45)

## 2022-02-28 LAB — URINALYSIS, ROUTINE W REFLEX MICROSCOPIC
Bacteria, UA: NONE SEEN
Bilirubin Urine: NEGATIVE
Glucose, UA: 150 mg/dL — AB
Hgb urine dipstick: NEGATIVE
Ketones, ur: NEGATIVE mg/dL
Leukocytes,Ua: NEGATIVE
Nitrite: NEGATIVE
Protein, ur: >=300 mg/dL — AB
Specific Gravity, Urine: 1.011 (ref 1.005–1.030)
Squamous Epithelial / HPF: NONE SEEN (ref 0–5)
pH: 8 (ref 5.0–8.0)

## 2022-02-28 LAB — RESP PANEL BY RT-PCR (RSV, FLU A&B, COVID)  RVPGX2
Influenza A by PCR: NEGATIVE
Influenza B by PCR: NEGATIVE
Resp Syncytial Virus by PCR: NEGATIVE
SARS Coronavirus 2 by RT PCR: NEGATIVE

## 2022-02-28 LAB — CBC WITH DIFFERENTIAL/PLATELET
Abs Immature Granulocytes: 0.07 10*3/uL (ref 0.00–0.07)
Basophils Absolute: 0 10*3/uL (ref 0.0–0.1)
Basophils Relative: 0 %
Eosinophils Absolute: 0.1 10*3/uL (ref 0.0–0.5)
Eosinophils Relative: 1 %
HCT: 36.2 % — ABNORMAL LOW (ref 39.0–52.0)
Hemoglobin: 11.7 g/dL — ABNORMAL LOW (ref 13.0–17.0)
Immature Granulocytes: 1 %
Lymphocytes Relative: 9 %
Lymphs Abs: 0.8 10*3/uL (ref 0.7–4.0)
MCH: 30.7 pg (ref 26.0–34.0)
MCHC: 32.3 g/dL (ref 30.0–36.0)
MCV: 95 fL (ref 80.0–100.0)
Monocytes Absolute: 0.6 10*3/uL (ref 0.1–1.0)
Monocytes Relative: 7 %
Neutro Abs: 7.6 10*3/uL (ref 1.7–7.7)
Neutrophils Relative %: 82 %
Platelets: 205 10*3/uL (ref 150–400)
RBC: 3.81 MIL/uL — ABNORMAL LOW (ref 4.22–5.81)
RDW: 13.5 % (ref 11.5–15.5)
WBC: 9.2 10*3/uL (ref 4.0–10.5)
nRBC: 0 % (ref 0.0–0.2)

## 2022-02-28 LAB — LACTIC ACID, PLASMA
Lactic Acid, Venous: 1.6 mmol/L (ref 0.5–1.9)
Lactic Acid, Venous: 1.2 mmol/L (ref 0.5–1.9)

## 2022-02-28 LAB — COMPREHENSIVE METABOLIC PANEL
ALT: 22 U/L (ref 0–44)
AST: 27 U/L (ref 15–41)
Albumin: 3.7 g/dL (ref 3.5–5.0)
Alkaline Phosphatase: 93 U/L (ref 38–126)
Anion gap: 10 (ref 5–15)
BUN: 27 mg/dL — ABNORMAL HIGH (ref 8–23)
CO2: 35 mmol/L — ABNORMAL HIGH (ref 22–32)
Calcium: 9.2 mg/dL (ref 8.9–10.3)
Chloride: 95 mmol/L — ABNORMAL LOW (ref 98–111)
Creatinine, Ser: 1.06 mg/dL (ref 0.61–1.24)
GFR, Estimated: >60 mL/min (ref >60)
Glucose, Bld: 230 mg/dL — ABNORMAL HIGH (ref 70–99)
Potassium: 4.1 mmol/L (ref 3.5–5.1)
Sodium: 140 mmol/L (ref 135–145)
Total Bilirubin: 0.6 mg/dL (ref 0.3–1.2)
Total Protein: 7.3 g/dL (ref 6.5–8.1)

## 2022-02-28 LAB — PROCALCITONIN: Procalcitonin: 0.11 ng/mL

## 2022-02-28 LAB — GLUCOSE, CAPILLARY
Glucose-Capillary: 138 mg/dL — ABNORMAL HIGH (ref 70–99)
Glucose-Capillary: 87 mg/dL (ref 70–99)

## 2022-02-28 LAB — PHOSPHORUS: Phosphorus: 2.9 mg/dL (ref 2.5–4.6)

## 2022-02-28 LAB — TROPONIN I (HIGH SENSITIVITY)
Troponin I (High Sensitivity): 25 ng/L — ABNORMAL HIGH (ref <18)
Troponin I (High Sensitivity): 31 ng/L — ABNORMAL HIGH (ref <18)

## 2022-02-28 LAB — MAGNESIUM: Magnesium: 1.6 mg/dL — ABNORMAL LOW (ref 1.7–2.4)

## 2022-02-28 MED ORDER — SODIUM CHLORIDE 0.9 % IV SOLN
500.0000 mg | INTRAVENOUS | Status: DC
  Administered 2022-03-01 – 2022-03-02 (×2): 500 mg via INTRAVENOUS
  Filled 2022-02-28: qty 500
  Filled 2022-02-28: qty 5

## 2022-02-28 MED ORDER — AMLODIPINE BESYLATE 10 MG PO TABS
10.0000 mg | ORAL_TABLET | Freq: Every day | ORAL | Status: DC
  Administered 2022-02-28 – 2022-03-03 (×4): 10 mg via ORAL
  Filled 2022-02-28: qty 1
  Filled 2022-02-28: qty 2
  Filled 2022-02-28 (×2): qty 1

## 2022-02-28 MED ORDER — LACTATED RINGERS IV BOLUS
1000.0000 mL | Freq: Once | INTRAVENOUS | Status: AC
  Administered 2022-02-28: 1000 mL via INTRAVENOUS

## 2022-02-28 MED ORDER — SENNOSIDES-DOCUSATE SODIUM 8.6-50 MG PO TABS: 1.0000 | ORAL_TABLET | Freq: Every evening | ORAL | Status: DC | PRN

## 2022-02-28 MED ORDER — MOMETASONE FURO-FORMOTEROL FUM 200-5 MCG/ACT IN AERO
2.0000 | INHALATION_SPRAY | Freq: Two times a day (BID) | RESPIRATORY_TRACT | Status: DC
  Administered 2022-03-01 – 2022-03-03 (×5): 2 via RESPIRATORY_TRACT
  Filled 2022-02-28 (×2): qty 8.8

## 2022-02-28 MED ORDER — BUDESON-GLYCOPYRROL-FORMOTEROL 160-9-4.8 MCG/ACT IN AERO: 2.0000 | INHALATION_SPRAY | Freq: Two times a day (BID) | RESPIRATORY_TRACT | Status: DC

## 2022-02-28 MED ORDER — SODIUM CHLORIDE 0.9 % IV BOLUS (SEPSIS)
1000.0000 mL | Freq: Once | INTRAVENOUS | Status: AC
  Administered 2022-02-28: 1000 mL via INTRAVENOUS

## 2022-02-28 MED ORDER — ONDANSETRON HCL 4 MG PO TABS: 4.0000 mg | ORAL_TABLET | Freq: Four times a day (QID) | ORAL | Status: DC | PRN

## 2022-02-28 MED ORDER — HYDRALAZINE HCL 20 MG/ML IJ SOLN: 5.0000 mg | Freq: Three times a day (TID) | INTRAMUSCULAR | Status: DC | PRN

## 2022-02-28 MED ORDER — ATORVASTATIN CALCIUM 20 MG PO TABS
40.0000 mg | ORAL_TABLET | Freq: Every day | ORAL | Status: DC
  Administered 2022-02-28 – 2022-03-02 (×3): 40 mg via ORAL
  Filled 2022-02-28 (×3): qty 2

## 2022-02-28 MED ORDER — IPRATROPIUM-ALBUTEROL 0.5-2.5 (3) MG/3ML IN SOLN
3.0000 mL | Freq: Four times a day (QID) | RESPIRATORY_TRACT | Status: DC | PRN
  Administered 2022-03-01: 3 mL via RESPIRATORY_TRACT
  Filled 2022-02-28: qty 3

## 2022-02-28 MED ORDER — VITAMIN D 25 MCG (1000 UNIT) PO TABS
2000.0000 [IU] | ORAL_TABLET | Freq: Every day | ORAL | Status: DC
  Administered 2022-03-01 – 2022-03-03 (×3): 2000 [IU] via ORAL
  Filled 2022-02-28 (×3): qty 2

## 2022-02-28 MED ORDER — MELATONIN 5 MG PO TABS: 5.0000 mg | ORAL_TABLET | Freq: Every evening | ORAL | Status: DC | PRN

## 2022-02-28 MED ORDER — MAGNESIUM SULFATE IN D5W 1-5 GM/100ML-% IV SOLN
1.0000 g | Freq: Once | INTRAVENOUS | Status: AC
  Administered 2022-02-28: 1 g via INTRAVENOUS
  Filled 2022-02-28 (×2): qty 100

## 2022-02-28 MED ORDER — INSULIN ASPART 100 UNIT/ML IJ SOLN
0.0000 [IU] | Freq: Every day | INTRAMUSCULAR | Status: DC
  Administered 2022-03-02: 3 [IU] via SUBCUTANEOUS
  Filled 2022-02-28: qty 1

## 2022-02-28 MED ORDER — HALOPERIDOL LACTATE 5 MG/ML IJ SOLN: 5.0000 mg | Freq: Four times a day (QID) | INTRAMUSCULAR | Status: DC | PRN

## 2022-02-28 MED ORDER — SODIUM CHLORIDE 0.9 % IV SOLN
2.0000 g | INTRAVENOUS | Status: DC
  Administered 2022-03-01 – 2022-03-02 (×2): 2 g via INTRAVENOUS
  Filled 2022-02-28: qty 2
  Filled 2022-02-28: qty 20

## 2022-02-28 MED ORDER — SODIUM CHLORIDE 0.9 % IV SOLN: INTRAVENOUS | Status: AC

## 2022-02-28 MED ORDER — NITROGLYCERIN 0.4 MG SL SUBL: 0.4000 mg | SUBLINGUAL_TABLET | SUBLINGUAL | Status: DC | PRN

## 2022-02-28 MED ORDER — ACETAMINOPHEN 650 MG RE SUPP: 650.0000 mg | Freq: Four times a day (QID) | RECTAL | Status: DC | PRN

## 2022-02-28 MED ORDER — METOPROLOL TARTRATE 50 MG PO TABS
50.0000 mg | ORAL_TABLET | Freq: Two times a day (BID) | ORAL | Status: DC
  Administered 2022-02-28 – 2022-03-03 (×6): 50 mg via ORAL
  Filled 2022-02-28 (×6): qty 1

## 2022-02-28 MED ORDER — LORAZEPAM 2 MG/ML IJ SOLN
0.5000 mg | Freq: Four times a day (QID) | INTRAMUSCULAR | Status: DC | PRN
  Filled 2022-02-28: qty 1

## 2022-02-28 MED ORDER — SODIUM CHLORIDE 0.9 % IV SOLN
500.0000 mg | Freq: Once | INTRAVENOUS | Status: AC
  Administered 2022-02-28: 500 mg via INTRAVENOUS
  Filled 2022-02-28: qty 5

## 2022-02-28 MED ORDER — ONDANSETRON HCL 4 MG/2ML IJ SOLN: 4.0000 mg | Freq: Four times a day (QID) | INTRAMUSCULAR | Status: DC | PRN

## 2022-02-28 MED ORDER — INSULIN ASPART 100 UNIT/ML IJ SOLN
0.0000 [IU] | Freq: Three times a day (TID) | INTRAMUSCULAR | Status: DC
  Administered 2022-03-01: 5 [IU] via SUBCUTANEOUS
  Administered 2022-03-01: 1 [IU] via SUBCUTANEOUS
  Administered 2022-03-02: 7 [IU] via SUBCUTANEOUS
  Administered 2022-03-02: 1 [IU] via SUBCUTANEOUS
  Administered 2022-03-02: 5 [IU] via SUBCUTANEOUS
  Administered 2022-03-03: 2 [IU] via SUBCUTANEOUS
  Administered 2022-03-03: 5 [IU] via SUBCUTANEOUS
  Filled 2022-02-28 (×7): qty 1

## 2022-02-28 MED ORDER — SODIUM CHLORIDE 0.9 % IV SOLN
2.0000 g | Freq: Once | INTRAVENOUS | Status: AC
  Administered 2022-02-28: 2 g via INTRAVENOUS
  Filled 2022-02-28: qty 20

## 2022-02-28 MED ORDER — ASPIRIN 81 MG PO TBEC
81.0000 mg | DELAYED_RELEASE_TABLET | Freq: Every day | ORAL | Status: DC
  Administered 2022-02-28 – 2022-03-03 (×4): 81 mg via ORAL
  Filled 2022-02-28 (×4): qty 1

## 2022-02-28 MED ORDER — ENOXAPARIN SODIUM 40 MG/0.4ML IJ SOSY
40.0000 mg | PREFILLED_SYRINGE | INTRAMUSCULAR | Status: DC
  Administered 2022-02-28 – 2022-03-02 (×3): 40 mg via SUBCUTANEOUS
  Filled 2022-02-28 (×3): qty 0.4

## 2022-02-28 MED ORDER — LISINOPRIL 20 MG PO TABS
20.0000 mg | ORAL_TABLET | Freq: Every day | ORAL | Status: DC
  Administered 2022-03-01 – 2022-03-03 (×3): 20 mg via ORAL
  Filled 2022-02-28 (×3): qty 1

## 2022-02-28 MED ORDER — ACETAMINOPHEN 325 MG PO TABS: 650.0000 mg | ORAL_TABLET | Freq: Four times a day (QID) | ORAL | Status: DC | PRN

## 2022-02-28 NOTE — Assessment & Plan Note (Signed)
-  Recheck magnesium level in the a.m. - Magnesium 1 g IV one-time dose ordered

## 2022-02-28 NOTE — Assessment & Plan Note (Signed)
With hyperglycemia - Home metformin 1000 mg p.o. twice daily has not been resumed on admission - Insulin SSI with at bedtime coverage, thin/renal dosing ordered - Goal inpatient blood glucose levels 140-180

## 2022-02-28 NOTE — Assessment & Plan Note (Addendum)
-  On chronic 4 L nasal cannula, does not appear to be in acute exacerbation - I resumed home maintenance inhaler and DuoNebs every 6 hours as needed for wheezing and shortness of breath

## 2022-02-28 NOTE — H&P (Signed)
History and Physical   Stanley Mclean:[id number] DOB: Dec 26, 1940 DOA: 02/28/2022  PCP: Olen Pel, NP  Outpatient Specialists: Dr. Rockey Situ, [facility] Cardiology Patient coming from: Home via EMS  I have personally briefly reviewed patient's old medical records in [place].  Chief Concern: Altered mental status  HPI: Stanley Mclean is a 81 year old male with history of hypertension, COPD, on chronic 4 L nasal cannula, BPH, hyperlipidemia, non-insulin-dependent diabetes mellitus, who presents emergency department for chief concerns of altered mental status.  Per spouse at bedside, patient was at his baseline self last night.  She noticed the altered mental status at 5 AM on day of admission.  Initial vitals in the ED showed temperature of 98.4, respiration rate of 15, heart rate of 85, blood pressure 181/82, SpO2 of 98% on his baseline 4 L nasal cannula.  Serum sodium is 140, potassium 4.1, chloride 95, bicarb 35, BUN of 27, serum creatinine of 1.06, nonfasting blood glucose 230, EGFR greater than 60, WBC 9.2, hemoglobin 11.7, platelets of 205.  High sensitive troponin was 25.  COVID/influenza A/influenza B/RSV PCR were negative.  CT of the head without contrast was read as no acute intracranial findings.  Portable chest x-ray 1 view: New mild asymmetric nodular opacities in both lungs concerning for multifocal pneumonia.  ED treatment: Azithromycin 500 mg IV one-time dose, ceftriaxone 2 g IV one-time dose, LR 1 L bolus. --------------------------------- Patient is leaning towards the right, is able to tell me his full name, he knows he is in the hospital.  He was not able to answer me his age, or his son-in-law Tommie Raymond at bedside.  Per nursing, he told her that it was August. Per family at bedside, at baseline patient is awake alert and oriented x 4.  He manages his own finances and pays all the bills.  At approximately 5 AM today, his spouse who is wheelchair-bound  states that he urinated on himself and took all his clothes off and was very confused.  There was no reported fever at home, vomiting, diarrhea.  Social history: He lives at home with his wife.  He is currently a tobacco user though his son-in-law does not know how much he smokes per day.  Formerly son-in-law states that he smoked at least 2 packs/day.  Family reports he does not drink alcohol and or use recreational drugs.  ROS: Unable to complete as he is altered  ED Course: Discussed with emergency medicine provider, patient requiring hospitalization for chief concerns of altered mental status.  Assessment/Plan  Principal Problem:   Altered mental status Active Problems:   CAD (coronary artery disease)   SOB (shortness of breath)   S/P coronary artery stent placement   Diabetes mellitus type 2, noninsulin dependent (HCC)   Essential hypertension   Centrilobular emphysema (HCC)   History of CVA (cerebrovascular accident)   Hyperlipidemia   Phantom limb syndrome with pain (HCC)   Elevated troponin   Urinary retention   Hypomagnesemia   Assessment and Plan:  * Altered mental status - Etiology workup in progress, differentials include multilobar pneumonia/urinary tract infection - No focal neurological deficits, low clinical suspicion for CVA at this time - UA and urine culture has been ordered along with bladder scan and pending completion and collection - Procalcitonin is pending and I have ordered for repeat daily, 2 days ordered - Continue with azithromycin 500 mg IV to complete a 5-day course; ceftriaxone 2 g IV daily to complete a 5-day course -  Admit to telemetry medical, inpatient  Hypomagnesemia - Recheck magnesium level in the a.m. - Magnesium 1 g IV one-time dose ordered  Urinary retention - Patient had approximately 650 mL of urine from In-N-Out cath - Will insert Foley  Elevated troponin - I suspect this is secondary to demand ischemia in setting of  multilobar/multifocal pneumonia, we will treat as such - We will continue with telemetry monitoring  Hyperlipidemia - Atorvastatin 40 mg nightly resumed  Centrilobular emphysema (HCC) - On chronic 4 L nasal cannula, does not appear to be in acute exacerbation - I resumed home maintenance inhaler and DuoNebs every 6 hours as needed for wheezing and shortness of breath  Essential hypertension - Hydralazine 5 mg IV every 8 hours as needed for SBP greater than 180, 4 days ordered  Diabetes mellitus type 2, noninsulin dependent (HCC) With hyperglycemia - Home metformin 1000 mg p.o. twice daily has not been resumed on admission - Insulin SSI with at bedtime coverage, thin/renal dosing ordered - Goal inpatient blood glucose levels 140-180  S/P coronary artery stent placement - Aspirin 81 mg daily, atorvastatin 40 mg nightly, lisinopril 20 mg daily, metoprolol tartrate 50 mg p.o. twice daily resumed on admission  Chart reviewed.   DVT prophylaxis: Enoxaparin Code Status: Full code Diet: N.p.o. except for sips with meds; SLP ordered Family Communication: Updated son-in-law at bedside Disposition Plan: Pending clinical course Consults called: None at this time Admission status: Inpatient, telemetry medical  Past Medical History:  Diagnosis Date   Anemia    Basal cell carcinoma    BPH (benign prostatic hyperplasia)    CAD (coronary artery disease)    a. s/p multiple prior PCIs b. s/p DES-distal RCA 05/2013   COPD (chronic obstructive pulmonary disease) ([place])    GERD (gastroesophageal reflux disease)    Macular degeneration    Pure hypercholesterolemia    Stroke ([place])    Tobacco abuse    Traumatic amputation of leg(s) (complete) (partial), unilateral, level not specified, without mention of complication 30/86/5784   Right Below Knee Amputation   Unspecified essential hypertension    Past Surgical History:  Procedure Laterality Date   AMPUTATION Right 08/26/2011   Below Knee  Amputee   APPENDECTOMY     CARDIAC CATHETERIZATION     CAROTID ANGIOGRAPHY Right 05/14/2016   Procedure: Carotid Angiography;  Surgeon: Katha Cabal, MD;  Location: [place] [facility];  Service: Cardiovascular;  Laterality: Right;   CAROTID PTA/STENT INTERVENTION  05/14/2016   Procedure: Carotid PTA/Stent Intervention;  Surgeon: Katha Cabal, MD;  Location: [place] [facility];  Service: Cardiovascular;;   CORONARY ANGIOPLASTY WITH STENT PLACEMENT  09/28/2006   RCA   CORONARY ANGIOPLASTY WITH STENT PLACEMENT  07/21/2006   Mid LAD   CORONARY ANGIOPLASTY WITH STENT PLACEMENT  06/06/2013   30% prox LAD at the site of a prior stent, 40% mid LAD at the origin of D1, 40% ostial D1, 30% prox LCx at the site of a prior stent, in the proximal third of the vessel segment, 20% prox RCA, 99% distal RCA s/p DES. EF >55%.   Social History:  reports that he has been smoking cigarettes. He has a 60.00 pack-year smoking history. He has never used smokeless tobacco. He reports that he does not drink alcohol and does not use drugs.  No Known Allergies Family History  Problem Relation Age of Onset   Mental illness Mother    Family history: Family history reviewed and not pertinent  Prior to  Admission medications   Medication Sig Start Date End Date Taking? Authorizing Provider  albuterol (VENTOLIN HFA) 108 (90 Base) MCG/ACT inhaler Inhale 2 puffs into the lungs every 6 (six) hours as needed for wheezing or shortness of breath. 04/08/21  Yes Tyler Pita, MD  amLODipine (NORVASC) 10 MG tablet TAKE ONE-HALF TABLET BY MOUTH EVERY DAY FOR BLOOD PRESSURE 11/26/21  Yes [provider]  aspirin EC 81 MG tablet Take 1 tablet (81 mg total) by mouth daily. 12/01/18  Yes Gollan, Kathlene November, MD  atorvastatin (LIPITOR) 40 MG tablet TAKE 1 TABLET BY MOUTH EVERY DAY 10/06/18  Yes Karamalegos, Devonne Doughty, DO  Budeson-Glycopyrrol-Formoterol (BREZTRI AEROSPHERE) 160-9-4.8 MCG/ACT AERO Inhale 2 puffs into  the lungs in the morning and at bedtime. 04/08/21  Yes Tyler Pita, MD  Cholecalciferol 50 MCG (2000 UT) TABS Take 1 tablet by mouth daily. 08/15/21  Yes [provider]  furosemide (LASIX) 20 MG tablet Take 1 tablet (20 mg total) by mouth daily as needed. 07/30/21  Yes Gollan, Kathlene November, MD  ipratropium (ATROVENT) 0.06 % nasal spray Place 2 sprays into both nostrils 4 (four) times daily. 04/08/21  Yes Tyler Pita, MD  ipratropium-albuterol (DUONEB) 0.5-2.5 (3) MG/3ML SOLN USE 1 NEBULIZER SOLUTION (3ML) BY ORAL INHALATION EVERY 6 HOURS AS NEEDED FOR SHORTNESS OF BREATH 09/23/21  Yes [provider]  ketoconazole (NIZORAL) 2 % cream APPLY SUFFICIENT AMOUNT TOPICALLY TWO TIMES A DAY TO RASH ON FOOT. USE FOR 2 WEEKS, THEN DISCONTINUE 11/26/21  Yes [provider]  lisinopril (ZESTRIL) 40 MG tablet TAKE ONE-HALF TABLET BY MOUTH EVERY DAY FOR BLOOD PRESSURE 05/14/21  Yes [provider]  metFORMIN (GLUCOPHAGE) 500 MG tablet Take 1,000 mg by mouth 2 (two) times daily with a meal.   Yes [provider]  metoprolol tartrate (LOPRESSOR) 50 MG tablet TAKE 1 TABLET BY MOUTH TWICE A DAY 10/30/21  Yes Gollan, Kathlene November, MD  predniSONE (DELTASONE) 5 MG tablet Take 1 tablet by mouth daily. 11/26/21  Yes [provider]  tamsulosin (FLOMAX) 0.4 MG CAPS capsule TAKE ONE CAPSULE BY MOUTH EVERY EVENING TO REPLACE TERAZOSIN 11/15/21  Yes [provider]  albuterol (PROVENTIL HFA;VENTOLIN HFA) 108 (90 Base) MCG/ACT inhaler Inhale 2 puffs into the lungs every 6 (six) hours as needed for wheezing or shortness of breath. 11/19/16 01/27/22  Mikey College, NP  nitroGLYCERIN (NITROSTAT) 0.4 MG SL tablet Place 1 tablet (0.4 mg total) under the tongue every 5 (five) minutes as needed for chest pain. 10/13/17   Minna Merritts, MD  terazosin (HYTRIN) 1 MG capsule TAKE 1 CAPSULE BY MOUTH EVERYDAY AT BEDTIME Patient not taking: Reported on 02/28/2022 08/23/21    Minna Merritts, MD   Physical Exam: Vitals:   02/28/22 1700 02/28/22 1715 02/28/22 1730 02/28/22 1812  BP: (!) 151/85 (!) 146/87 (!) 165/91 (!) 154/81  Pulse: 90 91 88 96  Resp: _0 Temp:   98.5 F (36.9 C)   TempSrc:   Oral   SpO2: 97% 98% 97% 97%  Weight:       Constitutional: appears frail, confused Eyes: PERRL, lids and conjunctivae normal ENMT: Mucous membranes are dry. Posterior pharynx clear of any exudate or lesions. Age-appropriate dentition. Unable to correctly assess hearing Neck: normal, supple, no masses, no thyromegaly Respiratory: clear to auscultation bilaterally, no wheezing, no crackles. Normal respiratory effort. No accessory muscle use.  Cardiovascular: Regular rate and rhythm, no murmurs / rubs / gallops.  No extremity edema. 2+ pedal pulses. No carotid bruits.  Abdomen: no tenderness, no masses palpated, no hepatosplenomegaly. Bowel sounds positive.  Musculoskeletal: no clubbing / cyanosis. No joint deformity upper and lower extremities. Good ROM, no contractures, no atrophy. Normal muscle tone. Right BKA Skin: no rashes, lesions, ulcers. No induration Neurologic: Sensation intact. Strength 5/5 in all 4.  Psychiatric: Patient is acutely confused, unable to assess judgement and insight, alertness, orientation, mood.   EKG: independently reviewed, showing sinus rhythm with rate of 99, multiple premature complex, irregular rhythm, QTc 459  Chest x-ray on Admission: I personally reviewed and I agree with radiologist reading as below.  CT HEAD WO CONTRAST (5MM)  Result Date: 02/28/2022 CLINICAL DATA:  Mental status change. EXAM: CT HEAD WITHOUT CONTRAST TECHNIQUE: Contiguous axial images were obtained from the base of the skull through the vertex without intravenous contrast. RADIATION DOSE REDUCTION: This exam was performed according to the departmental dose-optimization program which includes automated exposure control, adjustment of the mA and/or kV  according to patient size and/or use of iterative reconstruction technique. COMPARISON:  Head CT 03/30/2016 is FINDINGS: Brain: No acute intracranial hemorrhage. No focal mass lesion. No CT evidence of acute infarction. No midline shift or mass effect. No hydrocephalus. Basilar cisterns are patent. There are periventricular and subcortical white matter hypodensities. Generalized cortical atrophy. Vascular: No hyperdense vessel or unexpected calcification. Skull: Normal. Negative for fracture or focal lesion. Sinuses/Orbits: Paranasal sinuses and mastoid air cells are clear. Orbits are clear. Other: None. IMPRESSION: No acute intracranial findings. Electronically Signed   By: Suzy Bouchard M.D.   On: 02/28/2022 11:32   DG Chest Portable 1 View  Result Date: 02/28/2022 CLINICAL DATA:  Altered mental status. EXAM: PORTABLE CHEST 1 VIEW COMPARISON:  Chest x-ray dated February 22, 2021. FINDINGS: The heart size and mediastinal contours are within normal limits. Normal pulmonary vascularity. New mild asymmetric nodular opacities in the right lower lobe, peripheral right mid lung, and peripheral left upper lobe. No pleural effusion or pneumothorax. No acute osseous abnormality. IMPRESSION: 1. New mild asymmetric nodular opacities in the both lungs, concerning for multifocal pneumonia. Electronically Signed   By: Titus Dubin M.D.   On: 02/28/2022 11:28    Labs on Admission: I have personally reviewed following labs  CBC: Recent Labs  Lab 02/28/22 1107  WBC 9.2  NEUTROABS 7.6  HGB 11.7*  HCT 36.2*  MCV 95.0  PLT 416   Basic Metabolic Panel: Recent Labs  Lab 02/28/22 1107 02/28/22 1305  NA 140  --   K 4.1  --   CL 95*  --   CO2 35*  --   GLUCOSE 230*  --   BUN 27*  --   CREATININE 1.06  --   CALCIUM 9.2  --   MG  --  1.6*  PHOS  --  2.9   GFR: Estimated Creatinine Clearance: 42.2 mL/min (by C-G formula based on SCr of 1.06 mg/dL). Liver Function Tests: Recent Labs  Lab  02/28/22 1107  AST 27  ALT 22  ALKPHOS 93  BILITOT 0.6  PROT 7.3  ALBUMIN 3.7   Urine analysis:    Component Value Date/Time   COLORURINE STRAW (A) 02/28/2022 1305   APPEARANCEUR CLEAR (A) 02/28/2022 1305   LABSPEC 1.011 02/28/2022 1305   PHURINE 8.0 02/28/2022 1305   GLUCOSEU 150 (A) 02/28/2022 1305   HGBUR NEGATIVE 02/28/2022 1305   BILIRUBINUR NEGATIVE 02/28/2022 1305   [place] 02/28/2022 1305   PROTEINUR >=300 (A)  02/28/2022 1305   NITRITE NEGATIVE 02/28/2022 [place] 02/28/2022 1305   CRITICAL CARE Performed by: Dr. Tobie Poet  Total critical care time: 35 minutes  Critical care time was exclusive of separately billable procedures and treating other patients.  Critical care was necessary to treat or prevent imminent or life-threatening deterioration.  Critical care was time spent personally by me on the following activities: development of treatment plan with patient and/or surrogate as well as nursing, discussions with consultants, evaluation of patient's response to treatment, examination of patient, obtaining history from patient or surrogate, ordering and performing treatments and interventions, ordering and review of laboratory studies, ordering and review of radiographic studies, pulse oximetry and re-evaluation of patient's condition.  This document was prepared using Dragon Voice Recognition software and may include unintentional dictation errors.  Dr. Tobie Poet [facility]  If 7PM-7AM, please contact overnight-coverage provider If 7AM-7PM, please contact day coverage provider [email or web address]  02/28/2022, 7:27 PM

## 2022-02-28 NOTE — Consult Note (Signed)
CODE SEPSIS - PHARMACY COMMUNICATION  **Broad Spectrum Antibiotics should be administered within 1 hour of Sepsis diagnosis**  Time Code Sepsis Called/Page Received: 1300  Antibiotics Ordered: azithromycin, CTO  Time of 1st antibiotic administration: 1234  Additional action taken by pharmacy: N/A     Alison Murray ,PharmD Clinical Pharmacist  02/28/2022  1:02 PM

## 2022-02-28 NOTE — Assessment & Plan Note (Signed)
-  Atorvastatin 40 mg nightly resumed

## 2022-02-28 NOTE — Assessment & Plan Note (Signed)
-  Hydralazine 5 mg IV every 8 hours as needed for SBP greater than 180, 4 days ordered

## 2022-02-28 NOTE — ED Triage Notes (Signed)
Pt. To ED for altered mental status since 0500 today. Pt's spouse states he was completely normal last night, A+O x4, and indep. ADL's. This AM, pt. Was confused and sleepy. Hx of diab, COPD, and HTN. Per medic, glucose was 195 en route.

## 2022-02-28 NOTE — Assessment & Plan Note (Signed)
-  Etiology workup in progress, differentials include multilobar pneumonia/urinary tract infection - No focal neurological deficits, low clinical suspicion for CVA at this time - UA and urine culture has been ordered along with bladder scan and pending completion and collection - Procalcitonin is pending and I have ordered for repeat daily, 2 days ordered - Continue with azithromycin 500 mg IV to complete a 5-day course; ceftriaxone 2 g IV daily to complete a 5-day course - Admit to telemetry medical, inpatient

## 2022-02-28 NOTE — ED Provider Notes (Signed)
[facility] Provider Note    Event Date/Time   First MD Initiated Contact with Patient 02/28/22 1049     (approximate)   History   Altered Mental Status (Pt. To ED for altered mental status since 0500 today. Pt's spouse states he was completely normal last night, A+O x4, and indep. ADL's. This AM, pt. Was confused and sleepy. Hx of diab, COPD, and HTN. Per medic, glucose was 195 en route.)   HPI  Stanley Mclean is a 81 y.o. male medical history of coronary artery disease, CVA, COPD on 2 L nasal cannula chronically, diabetes who presents because of altered mental status.  Unable to obtain any history from the patient because of his mental status changes.  Per EMS patient was in his normal state of health when they went to bed last night.  Wife found him this morning more confused and sleepy.  Apparently he is usually alert and oriented x 4 and independent with his ADLs.     Past Medical History:  Diagnosis Date   Anemia    Basal cell carcinoma    BPH (benign prostatic hyperplasia)    CAD (coronary artery disease)    a. s/p multiple prior PCIs b. s/p DES-distal RCA 05/2013   COPD (chronic obstructive pulmonary disease) (HCC)    GERD (gastroesophageal reflux disease)    Macular degeneration    Pure hypercholesterolemia    Stroke ([place])    Tobacco abuse    Traumatic amputation of leg(s) (complete) (partial), unilateral, level not specified, without mention of complication 05/69/7948   Right Below Knee Amputation   Unspecified essential hypertension     Patient Active Problem List   Diagnosis Date Noted   Pressure injury of skin 02/23/2021   COPD with acute exacerbation ([place])    Atypical pneumonia    Acute respiratory failure with hypoxia ([place])    Renal cyst, acquired, right 11/24/2019   Chronic bilateral low back pain with left-sided sciatica 10/16/2017   Phantom limb syndrome with pain ([place]) 07/29/2017   Primary osteoarthritis involving multiple  joints 07/29/2017   Hyperlipidemia 11/19/2016   Carotid artery stenosis 05/05/2016   History of CVA (cerebrovascular accident) 03/30/2016   Centrilobular emphysema ([place]) 03/27/2015   Asthmatic bronchitis 01/05/2015   Sensation of cold in leg 06/19/2014   Essential hypertension 06/22/2013   CAD (coronary artery disease) 06/01/2013   SOB (shortness of breath) 06/01/2013   Smoker 06/01/2013   S/P coronary artery stent placement 06/01/2013   DM (diabetes mellitus), type 2, uncontrolled, periph vascular complic 01/65/5374   Below knee amputation status, right ([place]) 06/01/2013     Physical Exam  Triage Vital Signs: ED Triage Vitals  Enc Vitals Group     BP 02/28/22 1057 (!) 181/93     Pulse Rate 02/28/22 1057 85     Resp 02/28/22 1057 18     Temp 02/28/22 1057 98.4 F (36.9 C)     Temp Source 02/28/22 1057 Oral     SpO2 02/28/22 1057 98 %     Weight 02/28/22 1058 116 lb 6.5 oz (52.8 kg)     Height --      Head Circumference --      Peak Flow --      Pain Score 02/28/22 1058 0     Pain Loc --      Pain Edu? --      Excl. in [place]? --     Most recent vital signs: Vitals:  02/28/22 1057  BP: (!) 181/93  Pulse: 85  Resp: 18  Temp: 98.4 F (36.9 C)  SpO2: 98%     General: Awake, no distress.  CV:  Good peripheral perfusion.  Status post left BKA no peripheral edema Resp:  Normal effort.  No increased work of breathing, wet sounding cough, lungs are clear no wheezing Abd:  No distention.  Abdomen is soft and nontender Neuro:             Awake, Alert, Oriented x 1 Other:  Patient is sleepy, intermittently falling asleep during exam can follow some basic commands pupils are reactive extraocular movements intact face is symmetric moves all extremities spontaneously  ED Results / Procedures / Treatments  Labs (all labs ordered are listed, but only abnormal results are displayed) Labs Reviewed  COMPREHENSIVE METABOLIC PANEL - Abnormal; Notable for the following components:       Result Value   Chloride 95 (*)    CO2 35 (*)    Glucose, Bld 230 (*)    BUN 27 (*)    All other components within normal limits  CBC WITH DIFFERENTIAL/PLATELET - Abnormal; Notable for the following components:   RBC 3.81 (*)    Hemoglobin 11.7 (*)    HCT 36.2 (*)    All other components within normal limits  BLOOD GAS, VENOUS - Abnormal; Notable for the following components:   pCO2, Ven 66 (*)    pO2, Ven 52 (*)    Bicarbonate 42.8 (*)    Acid-Base Excess 15.0 (*)    All other components within normal limits  TROPONIN I (HIGH SENSITIVITY) - Abnormal; Notable for the following components:   Troponin I (High Sensitivity) 25 (*)    All other components within normal limits  RESP PANEL BY RT-PCR (RSV, FLU A&B, COVID)  [id number]  CULTURE, BLOOD (ROUTINE X 2)  CULTURE, BLOOD (ROUTINE X 2)  URINALYSIS, ROUTINE W REFLEX MICROSCOPIC  PROCALCITONIN     EKG  EKG reviewed interpreted myself shows right bundle branch block with left anterior fascicular block multiple PVCs no acute schema changes  RADIOLOGY Chest x-ray reviewed interpreted myself shows multifocal pneumonia   PROCEDURES:  Critical Care performed: Yes, see critical care procedure note(s)  Procedures  The patient is on the cardiac monitor to evaluate for evidence of arrhythmia and/or significant heart rate changes.   MEDICATIONS ORDERED IN ED: Medications  lactated ringers bolus 1,000 mL (has no administration in time range)  cefTRIAXone (ROCEPHIN) 2 g in sodium chloride 0.9 % 100 mL IVPB (has no administration in time range)  azithromycin (ZITHROMAX) 500 mg in sodium chloride 0.9 % 250 mL IVPB (has no administration in time range)     IMPRESSION / MDM / ASSESSMENT AND PLAN / ED COURSE  I reviewed the triage vital signs and the nursing notes.                              Patient's presentation is most consistent with acute presentation with potential threat to life or bodily function.  Differential  diagnosis includes, but is not limited to, CVA, intracranial hemorrhage, metabolic abnormality uremia electrode abnormality sepsis UTI  Patient presents because of altered mental status.  History only obtained from EMS is that he was in his normal state of health which is independent with ADLs and alert and oriented last night and has been altered since today.  Patient can tell me his name but  he is fairly sleepy and not able to provide a history.  Does follow some simple commands and appears to have a nonfocal neurologic exam.  He is on his baseline 4 L nasal cannula does have a wet sounding cough abdomen benign.  Does look dry.  Plan to workup broadly for infectious metabolic causes as well as CT head.   Chest x-ray shows multifocal pneumonia.  Will cover with antibiotics.  Will send COVID and flu as well.   COVID and flu test is negative.  Patient was unable to urinate I have asked nursing to perform an In-N-Out cath.  Will cover with ceftriaxone and azithromycin for pneumonia.  I did attempt to call his wife but was unable to get in touch as it went to machine.  Patient does look dry some getting a bolus.       FINAL CLINICAL IMPRESSION(S) / ED DIAGNOSES   Final diagnoses:  Altered mental status, unspecified altered mental status type  Multifocal pneumonia     Rx / DC Orders   ED Discharge Orders     None        Note:  This document was prepared using Dragon voice recognition software and may include unintentional dictation errors.   Rada Hay, MD 02/28/22 1227

## 2022-02-28 NOTE — Assessment & Plan Note (Signed)
-  I suspect this is secondary to demand ischemia in setting of multilobar/multifocal pneumonia, we will treat as such - We will continue with telemetry monitoring

## 2022-02-28 NOTE — Hospital Course (Addendum)
[name] is a 81 year old male with history of hypertension, COPD, on chronic 4 L nasal cannula, BPH, hyperlipidemia, non-insulin-dependent diabetes mellitus, who presents emergency department for chief concerns of altered mental status.  Per spouse at bedside, patient was at his baseline self last night.  She noticed the altered mental status at 5 AM on day of admission.  Initial vitals in the ED showed temperature of 98.4, respiration rate of 15, heart rate of 85, blood pressure 181/82, SpO2 of 98% on his baseline 4 L nasal cannula.  Serum sodium is 140, potassium 4.1, chloride 95, bicarb 35, BUN of 27, serum creatinine of 1.06, nonfasting blood glucose 230, EGFR greater than 60, WBC 9.2, hemoglobin 11.7, platelets of 205.  High sensitive troponin was 25.  COVID/influenza A/influenza B/RSV PCR were negative.  CT of the head without contrast was read as no acute intracranial findings.  Portable chest x-ray 1 view: New mild asymmetric nodular opacities in both lungs concerning for multifocal pneumonia.  ED treatment: Azithromycin 500 mg IV one-time dose, ceftriaxone 2 g IV one-time dose, LR 1 L bolus.

## 2022-02-28 NOTE — Assessment & Plan Note (Signed)
-  Aspirin 81 mg daily, atorvastatin 40 mg nightly, lisinopril 20 mg daily, metoprolol tartrate 50 mg p.o. twice daily resumed on admission

## 2022-02-28 NOTE — Assessment & Plan Note (Signed)
-  Patient had approximately 650 mL of urine from In-N-Out cath - Will insert Foley

## 2022-03-01 ENCOUNTER — Inpatient Hospital Stay
Admit: 2022-03-01 | Discharge: 2022-03-01 | Disposition: A | Discharge status: Home / Self Care | Payer: PPO | Attending: Neurology | Admitting: Neurology

## 2022-03-01 DIAGNOSIS — I63532 Cerebral infarction due to unspecified occlusion or stenosis of left posterior cerebral artery: Secondary | ICD-10-CM

## 2022-03-01 DIAGNOSIS — J189 Pneumonia, unspecified organism: Secondary | ICD-10-CM

## 2022-03-01 DIAGNOSIS — G9341 Metabolic encephalopathy: Secondary | ICD-10-CM

## 2022-03-01 DIAGNOSIS — J9611 Chronic respiratory failure with hypoxia: Secondary | ICD-10-CM

## 2022-03-01 DIAGNOSIS — I63332 Cerebral infarction due to thrombosis of left posterior cerebral artery: Secondary | ICD-10-CM

## 2022-03-01 LAB — CBC
HCT: 37 % — ABNORMAL LOW (ref 39.0–52.0)
Hemoglobin: 11.9 g/dL — ABNORMAL LOW (ref 13.0–17.0)
MCH: 30.4 pg (ref 26.0–34.0)
MCHC: 32.2 g/dL (ref 30.0–36.0)
MCV: 94.6 fL (ref 80.0–100.0)
Platelets: 221 10*3/uL (ref 150–400)
RBC: 3.91 MIL/uL — ABNORMAL LOW (ref 4.22–5.81)
RDW: 13.6 % (ref 11.5–15.5)
WBC: 11.7 10*3/uL — ABNORMAL HIGH (ref 4.0–10.5)
nRBC: 0 % (ref 0.0–0.2)

## 2022-03-01 LAB — MAGNESIUM: Magnesium: 1.9 mg/dL (ref 1.7–2.4)

## 2022-03-01 LAB — GLUCOSE, CAPILLARY
Glucose-Capillary: 149 mg/dL — ABNORMAL HIGH (ref 70–99)
Glucose-Capillary: 107 mg/dL — ABNORMAL HIGH (ref 70–99)
Glucose-Capillary: 290 mg/dL — ABNORMAL HIGH (ref 70–99)
Glucose-Capillary: 167 mg/dL — ABNORMAL HIGH (ref 70–99)

## 2022-03-01 LAB — BASIC METABOLIC PANEL
Anion gap: 8 (ref 5–15)
BUN: 14 mg/dL (ref 8–23)
CO2: 32 mmol/L (ref 22–32)
Calcium: 8.5 mg/dL — ABNORMAL LOW (ref 8.9–10.3)
Chloride: 100 mmol/L (ref 98–111)
Creatinine, Ser: 0.86 mg/dL (ref 0.61–1.24)
GFR, Estimated: >60 mL/min (ref >60)
Glucose, Bld: 173 mg/dL — ABNORMAL HIGH (ref 70–99)
Potassium: 3.6 mmol/L (ref 3.5–5.1)
Sodium: 140 mmol/L (ref 135–145)

## 2022-03-01 LAB — URINE CULTURE: Culture: NO GROWTH

## 2022-03-01 LAB — PROCALCITONIN: Procalcitonin: <0.1 ng/mL

## 2022-03-01 LAB — LIPID PANEL
Cholesterol: 166 mg/dL (ref 0–200)
HDL: 78 mg/dL (ref >40)
LDL Cholesterol: 65 mg/dL (ref 0–99)
Total CHOL/HDL Ratio: 2.1 RATIO
Triglycerides: 113 mg/dL (ref <150)
VLDL: 23 mg/dL (ref 0–40)

## 2022-03-01 LAB — CORTISOL-AM, BLOOD: Cortisol - AM: 28.9 ug/dL — ABNORMAL HIGH (ref 6.7–22.6)

## 2022-03-01 MED ORDER — PREDNISONE 10 MG PO TABS
5.0000 mg | ORAL_TABLET | Freq: Every day | ORAL | Status: DC
  Administered 2022-03-01 – 2022-03-03 (×3): 5 mg via ORAL
  Filled 2022-03-01 (×3): qty 1

## 2022-03-01 MED ORDER — STROKE: EARLY STAGES OF RECOVERY BOOK: Freq: Once | Status: AC

## 2022-03-01 NOTE — Progress Notes (Signed)
SLP Cancellation Note  Patient Details Name: Stanley Mclean MRN: [id number] DOB: February 28, 1941   Cancelled treatment:       Reason Eval/Treat Not Completed: Fatigue/lethargy limiting ability to participate. Spoke with nsg; pt has not been alert or following commands when roused. Will reattempt later today as schedule allows.  Deneise Lever, [place], [employer] Pathologist [phone]    Aliene Altes 03/01/2022, 8:47 AM

## 2022-03-01 NOTE — Consult Note (Signed)
Neurology Consultation Reason for Consult: Stroke on MRI Requesting Physician: Fritzi Mandes  CC: AMS   History is obtained from: Son in law at bedside, patient (limited by AMS) and chart review   HPI: Stanley Mclean is a 81 y.o. male with a past medical history significant for diabetes, hypertension, hyperlipidemia, right lower extremity traumatic BKA, coronary artery disease s/p stent (2015), COPD on 4 L home O2, ongoing smoking 1 pack/day per November cardiology notes (no interest in quitting)  Per family no recent complaints.  He was last well when he went to bed on the evening of the 14th and at 5 AM on the 15th he was discovered to have urinated on himself and was confused and sleepy.  X-ray demonstrated pneumonia for which he was treated with ceftriaxone and azithromycin, fluids and prednisone.  MRI revealed  left PCA punctate infarcts for which neurology was consulted.  LKW: 12/14 evening Thrombolytic given?: No, out of the window IA performed?: No, exam not consistent with LVO  premorbid modified rankin scale:      2 - Slight disability. Able to look after own affairs without assistance, but unable to carry out all previous activities.  ROS: Weight loss over the past 1 year at least, previously reportedly weighed 175 pounds currently weighing 116 pounds; however weight is documented has 51.7 kg (52.8 kg currently) on 02/2021 admission; 60.8 kg 01/24/2020 on outpatient neurosurgery visit for right leg pain plan for right L4/5 hemilaminectomy and cyst removal which appears to have been subsequently canceled  Past Medical History:  Diagnosis Date   Anemia    Basal cell carcinoma    BPH (benign prostatic hyperplasia)    CAD (coronary artery disease)    a. s/p multiple prior PCIs b. s/p DES-distal RCA 05/2013   COPD (chronic obstructive pulmonary disease) (HCC)    GERD (gastroesophageal reflux disease)    Macular degeneration    Pure hypercholesterolemia    Stroke ([place])    Tobacco  abuse    Traumatic amputation of leg(s) (complete) (partial), unilateral, level not specified, without mention of complication 76/72/0947   Right Below Knee Amputation   Unspecified essential hypertension    Past Surgical History:  Procedure Laterality Date   AMPUTATION Right 08/26/2011   Below Knee Amputee   APPENDECTOMY     CARDIAC CATHETERIZATION     CAROTID ANGIOGRAPHY Right 05/14/2016   Procedure: Carotid Angiography;  Surgeon: Katha Cabal, MD;  Location: [facility];  Service: Cardiovascular;  Laterality: Right;   CAROTID PTA/STENT INTERVENTION  05/14/2016   Procedure: Carotid PTA/Stent Intervention;  Surgeon: Katha Cabal, MD;  Location: [facility];  Service: Cardiovascular;;   CORONARY ANGIOPLASTY WITH STENT PLACEMENT  09/28/2006   RCA   CORONARY ANGIOPLASTY WITH STENT PLACEMENT  07/21/2006   Mid LAD   CORONARY ANGIOPLASTY WITH STENT PLACEMENT  06/06/2013   30% prox LAD at the site of a prior stent, 40% mid LAD at the origin of D1, 40% ostial D1, 30% prox LCx at the site of a prior stent, in the proximal third of the vessel segment, 20% prox RCA, 99% distal RCA s/p DES. EF >55%.     Current Outpatient Medications  Medication Instructions   albuterol (PROVENTIL HFA;VENTOLIN HFA) 108 (90 Base) MCG/ACT inhaler 2 puffs, Inhalation, Every 6 hours PRN   albuterol (VENTOLIN HFA) 108 (90 Base) MCG/ACT inhaler 2 puffs, Inhalation, Every 6 hours PRN   amLODipine (NORVASC) 10 MG tablet TAKE ONE-HALF TABLET BY MOUTH  EVERY DAY FOR BLOOD PRESSURE   aspirin EC 81 mg, Oral, Daily   atorvastatin (LIPITOR) 40 MG tablet TAKE 1 TABLET BY MOUTH EVERY DAY   Budeson-Glycopyrrol-Formoterol (BREZTRI AEROSPHERE) 160-9-4.8 MCG/ACT AERO 2 puffs, Inhalation, 2 times daily   Cholecalciferol 50 MCG (2000 UT) TABS 1 tablet, Oral, Daily   furosemide (LASIX) 20 mg, Oral, Daily PRN   ipratropium (ATROVENT) 0.06 % nasal spray 2 sprays, Each Nare, 4 times daily   ipratropium-albuterol  (DUONEB) 0.5-2.5 (3) MG/3ML SOLN USE 1 NEBULIZER SOLUTION (3ML) BY ORAL INHALATION EVERY 6 HOURS AS NEEDED FOR SHORTNESS OF BREATH   ketoconazole (NIZORAL) 2 % cream APPLY SUFFICIENT AMOUNT TOPICALLY TWO TIMES A DAY TO RASH ON FOOT. USE FOR 2 WEEKS, THEN DISCONTINUE   lisinopril (ZESTRIL) 40 MG tablet TAKE ONE-HALF TABLET BY MOUTH EVERY DAY FOR BLOOD PRESSURE   metFORMIN (GLUCOPHAGE) 1,000 mg, Oral, 2 times daily with meals   metoprolol tartrate (LOPRESSOR) 50 MG tablet TAKE 1 TABLET BY MOUTH TWICE A DAY   nitroGLYCERIN (NITROSTAT) 0.4 mg, Sublingual, Every 5 min PRN   predniSONE (DELTASONE) 5 MG tablet 1 tablet, Oral, Daily   tamsulosin (FLOMAX) 0.4 MG CAPS capsule TAKE ONE CAPSULE BY MOUTH EVERY EVENING TO REPLACE TERAZOSIN     Family History  Problem Relation Age of Onset   Mental illness Mother     Social History:  reports that he has been smoking cigarettes. He has a 60.00 pack-year smoking history. He has never used smokeless tobacco. He reports that he does not drink alcohol and does not use drugs.   Exam: Current vital signs: BP (!) 161/78 (BP Location: Left Arm)   Pulse 90   Temp 98.2 F (36.8 C)   Resp 18   Wt 52.8 kg   SpO2 (!) 83%   BMI 17.19 kg/m  Vital signs in last 24 hours: Temp:  [97.9 F (36.6 C)-98.5 F (36.9 C)] 98.2 F (36.8 C) (12/16 0803) Pulse Rate:  [68-107] 90 (12/16 0803) Resp:  [14-30] 18 (12/16 0803) BP: (146-198)/(75-108) 161/78 (12/16 0803) SpO2:  [83 %-100 %] 83 % (12/16 0803)   Physical Exam  Constitutional: Appears cachectic Psych: Affect intermittently uncooperative but will participate in most of the examination Eyes: No scleral injection HENT: No oropharyngeal obstruction.  MSK: Right BKA Cardiovascular: Perfusing extremities well Respiratory: Effort normal, non-labored breathing, home 4 L nasal cannula in place GI: Soft.  No distension. There is no tenderness.  Skin: Warm dry and intact visible skin  Neuro: Mental  Status: Patient is awake, alert, oriented to person, place, month, year, but gives limited details of situation, with poor short-term recall Gives limited history, does seem confused at times with difficulty following confrontational strength testing examination Reports his watch is a brassier, reports pen is a pencil Cranial Nerves: II: Visual Fields are full. Pupils are equal, round, and reactive to light.   III,IV, VI: EOMI without ptosis or diploplia.  V: Facial sensation is symmetric to light touch VII: Facial movement is symmetric.  VIII: hearing is intact to voice X: Uvula elevates symmetrically XI: Shoulder shrug is symmetric. XII: tongue is midline without atrophy or fasciculations.  Motor: Diffuse muscle wasting throughout.  Mild diffuse weakness, slightly more prominent in the left upper extremity compared to the right with an upper motor neuron pattern (although flexion and slightly stronger than elbow extension and grip strength is slightly weaker on the left than the right).  4 -/5 left hip flexion.  5/5 knee flexion and extension  bilaterally within limits of his BKA on the right Sensory: Reports equal and symmetric in the arms and legs bilaterally Deep Tendon Reflexes: 3+ and symmetric in the brachioradialis and patellae.  Cerebellar: FNF and toe to finger are intact within limits of requiring multiple repetitions to understand command Gait:  Deferred, BKA without prosthetic in place at this time  NIHSS total 1 Score breakdown: Mild aphasia Performed at 12:30 PM   I have reviewed labs in epic and the results pertinent to this consultation are:  Basic Metabolic Panel: Recent Labs  Lab 02/28/22 1107 02/28/22 1305 03/01/22 0645  NA 140  --  140  K 4.1  --  3.6  CL 95*  --  100  CO2 35*  --  32  GLUCOSE 230*  --  173*  BUN 27*  --  14  CREATININE 1.06  --  0.86  CALCIUM 9.2  --  8.5*  MG  --  1.6* 1.9  PHOS  --  2.9  --     CBC: Recent Labs  Lab  02/28/22 1107 03/01/22 0639  WBC 9.2 11.7*  NEUTROABS 7.6  --   HGB 11.7* 11.9*  HCT 36.2* 37.0*  MCV 95.0 94.6  PLT 205 221    Coagulation Studies: No results for input(s): "LABPROT", "INR" in the last 72 hours.   Lab Results  Component Value Date   HGBA1C 7.4 (H) 02/23/2021   Lab Results  Component Value Date   CHOL 123 07/07/2017   HDL 61 07/07/2017   LDLCALC 61 07/07/2017   TRIG 131 07/07/2017   CHOLHDL 3.3 03/30/2016     I have reviewed the images obtained:  MRI brain personally reviewed, agree with radiology:   1. A few small foci of acute ischemia in the left PCA territory. No hemorrhage or mass effect. 2. Multiple old small vessel infarcts of the corona radiata   CT chest 04/03/2021 1. There are several irregular left lung nodules, including one corresponding with the questioned abnormality on recent radiographs. This appears mildly enlarged over this 6 week interval, and the apical components are new from prior neck CT of 2018. Multiplicity favors an infectious/inflammatory process, although malignancy not excluded. Consider repeat chest radiographs to assess for change from 02/22/2021. Recommend CT follow-up at 2-3 months to assess persistence and exclude malignancy. 2. No adenopathy or pleural effusion identified. 3. Coronary and aortic Atherosclerosis (ICD10-I70.0) and Emphysema (ICD10-J43.9).  Impression: Atheroembolic versus cardioembolic stroke, given extensive smoking history and weight loss reported (though on chart review appears to have been more gradual over the past 2 years or so), hypercoagulable state in the setting of malignancy should also be considered.  Recommendations: # Left PCA punctate strokes - Stroke labs HgbA1c, fasting lipid panel (ordered)  - CTA head and neck (ordered) - CT chest/abdomen/pelvis w/ contrast (ordered) - Frequent neuro checks - Echocardiogram (ordered)  - Prophylactic therapy-Antiplatelet med: Aspirin - dose  355m PO or 3090mPR, followed by 81 mg daily - Consider Plavix 300 mg load with 75 mg daily for 21 - 90 day course; will hold for now pending malignancy screening - Risk factor modification - Telemetry monitoring, will consider event monitor on discharge pending echocardiogram results - Given at least 24 hours have passed since symptom discovery, okay to start gradually normalizing blood pressures over the next 3 to 5 days  - PT consult, OT consult, Speech consult,  - Neurology will continue to follow   SrLesleigh NoeD-PhD [facility] [phone]  coverage for [facility] is from 8 AM to 4 AM in-house and 4 PM to 8 PM by telephone/video. 8 PM to 8 AM emergent questions or overnight urgent questions should be addressed to Teleneurology On-call or [facility] neurohospitalist; contact information can be found on AMION

## 2022-03-01 NOTE — Progress Notes (Signed)
OT Cancellation Note  Patient Details Name: Stanley Mclean MRN: [id number] DOB: [birth year]   Cancelled Treatment:    Reason Eval/Treat Not Completed: Patient declined, no reason specified (Attempted the OT initial evaluation. Pt. declined participation, and reported being tired and needing his sleep. Pt. reports not wanting that to be interfered with anymore today. Pt. requested to have therapy return the next day.)  Harrel Carina, MS, OTR/L 03/01/2022, 3:33 PM

## 2022-03-01 NOTE — Progress Notes (Signed)
[facility] [place] NAME: Stanley Mclean    MR#:  [id number]  DATE OF BIRTH:  April 30, 1940  SUBJECTIVE:  son-in-law at bedside. Spoke with daughter Stanley Mclean on the phone. Came in with altered mental status found to have stroke and pneumonia. Mentation much better. Patient answered most basic questions appropriately. Some cough. No fever. Patient wondering when he can start eating. Explained speech therapy evaluation needs to be completed    VITALS:  Blood pressure (!) 161/78, pulse 90, temperature 98.2 F (36.8 C), resp. rate 18, weight 52.8 kg, SpO2 (!) 83 %.  PHYSICAL EXAMINATION:   GENERAL:  81 y.o.-year-old patient lying in the bed with no acute distress. Weak, frail LUNGS: coarse breath sounds bilaterally, no wheezing CARDIOVASCULAR: S1, S2 normal. No murmur   ABDOMEN: Soft, nontender, nondistended. Bowel sounds present.  EXTREMITIES: No  edema b/l.    NEUROLOGIC: nonfocal  patient is alert and awake, gen weakness SKIN: No obvious rash, lesion, or ulcer.   LABORATORY PANEL:  CBC Recent Labs  Lab 03/01/22 0639  WBC 11.7*  HGB 11.9*  HCT 37.0*  PLT 221    Chemistries  Recent Labs  Lab 02/28/22 1107 02/28/22 1305 03/01/22 0645  NA 140  --  140  K 4.1  --  3.6  CL 95*  --  100  CO2 35*  --  32  GLUCOSE 230*  --  173*  BUN 27*  --  14  CREATININE 1.06  --  0.86  CALCIUM 9.2  --  8.5*  MG  --    < > 1.9  AST 27  --   --   ALT 22  --   --   ALKPHOS 93  --   --   BILITOT 0.6  --   --    < > = values in this interval not displayed.   Cardiac Enzymes No results for input(s): "TROPONINI" in the last 168 hours. RADIOLOGY:  MR BRAIN WO CONTRAST  Result Date: 02/28/2022 CLINICAL DATA:  Altered mental status EXAM: MRI HEAD WITHOUT CONTRAST TECHNIQUE: Multiplanar, multiecho pulse sequences of the brain and surrounding structures were obtained without intravenous contrast. COMPARISON:  None Available. FINDINGS: Brain: There  are a few small foci of abnormal diffusion restriction in the left PCA territory. Fewer than 5 scattered microhemorrhages in a nonspecific pattern. There is multifocal hyperintense T2-weighted signal within the white matter. Generalized volume loss. 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 The midline structures are normal. Vascular: Major flow voids are preserved. Skull and upper cervical spine: Normal calvarium and skull base. Visualized upper cervical spine and soft tissues are normal. Sinuses/Orbits:No paranasal sinus fluid levels or advanced mucosal thickening. No mastoid or middle ear effusion. Normal orbits. IMPRESSION: 1. A few small foci of acute ischemia in the left PCA territory. No hemorrhage or mass effect. 2. 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 Electronically Signed   By: Ulyses Jarred M.D.   On: 02/28/2022 20:03   CT HEAD WO CONTRAST (5MM)  Result Date: 02/28/2022 CLINICAL DATA:  Mental status change. EXAM: CT HEAD WITHOUT CONTRAST TECHNIQUE: Contiguous axial images were obtained from the base of the skull through the vertex without intravenous contrast. RADIATION DOSE REDUCTION: This exam was performed according to the departmental dose-optimization program which includes automated exposure control, adjustment of the mA and/or kV according to patient size and/or use of iterative reconstruction technique. COMPARISON:  Head CT 03/30/2016 is  FINDINGS: Brain: No acute intracranial hemorrhage. No focal mass lesion. No CT evidence of acute infarction. No midline shift or mass effect. No hydrocephalus. Basilar cisterns are patent. There are periventricular and subcortical white matter hypodensities. Generalized cortical atrophy. Vascular: No hyperdense vessel or unexpected calcification. Skull: Normal. Negative for fracture or focal lesion. Sinuses/Orbits: Paranasal sinuses and mastoid air cells are clear. Orbits are clear. Other: None. IMPRESSION: No acute  intracranial findings. Electronically Signed   By: Suzy Bouchard M.D.   On: 02/28/2022 11:32   DG Chest Portable 1 View  Result Date: 02/28/2022 CLINICAL DATA:  Altered mental status. EXAM: PORTABLE CHEST 1 VIEW COMPARISON:  Chest x-ray dated February 22, 2021. FINDINGS: The heart size and mediastinal contours are within normal limits. Normal pulmonary vascularity. New mild asymmetric nodular opacities in the right lower lobe, peripheral right mid lung, and peripheral left upper lobe. No pleural effusion or pneumothorax. No acute osseous abnormality. IMPRESSION: 1. New mild asymmetric nodular opacities in the both lungs, concerning for multifocal pneumonia. Electronically Signed   By: Titus Dubin M.D.   On: 02/28/2022 11:28    Assessment and Plan Stanley Mclean is a 81 year old male with history of hypertension, COPD, on chronic 4 L nasal cannula, BPH, hyperlipidemia, non-insulin-dependent diabetes mellitus, who presents emergency department for chief concerns of altered mental status.  Per spouse at bedside, patient was at his baseline self last night.  She noticed the altered mental status at 5 AM on day of admission.  Altered mental status/acute metabolic encephalopathy in the setting of acute small left PCA territory stroke and pneumonia -- patient's mentation is much improved. No neural deficit. -- Seen by neurology Dr. Curly Shores-- recommends CTA head and neck, CT chest abdomen and pelvis with contrast for weight loss eval -- aspirin 81 mg daily -- Plavix 300 mg load with 75 mg daily for 21 days -- PT OT and speech to evaluate  Multifocal pneumonia chronic respiratory failure secondary to severe emphysema on oxygen 3.5 liters chronically at home -- continue IV antibiotics, inhalers, oxygen, bronchodilator  Hypertension -- continue blood pressure meds  Type II diabetes, hyperglycemia -- sliding scale insulin for now -- will resume metformin or discharge  Acute urinary retention --  patient had in and out catheter -- external catheter for now  CAD status post stent -- aspirin, statin, Plavix -- amlodipine, lisinopril, metoprolol  Procedures: Family communication : daughter Stanley Mclean on the phone Consults : neurology CODE STATUS: full DVT Prophylaxis : Lovenox Level of care: Telemetry Medical Status is: Inpatient Remains inpatient appropriate because: CVA, Pneumonia    TOTAL TIME TAKING CARE OF THIS PATIENT: 35 minutes.  >50% time spent on counselling and coordination of care  Note: This dictation was prepared with Dragon dictation along with smaller phrase technology. Any transcriptional errors that result from this process are unintentional.  Fritzi Mandes M.D    [facility]   CC: Primary care physician; Olen Pel, NP

## 2022-03-01 NOTE — Evaluation (Signed)
Clinical/Bedside Swallow Evaluation Patient Details  Name: Stanley Mclean MRN: [id number] Date of Birth: 1940-06-29  Today's Date: 03/01/2022 Time: SLP Start Time (ACUTE ONLY): 81 SLP Stop Time (ACUTE ONLY): 3299 SLP Time Calculation (min) (ACUTE ONLY): 30 min  Past Medical History:  Past Medical History:  Diagnosis Date   Anemia    Basal cell carcinoma    BPH (benign prostatic hyperplasia)    CAD (coronary artery disease)    a. s/p multiple prior PCIs b. s/p DES-distal RCA 05/2013   COPD (chronic obstructive pulmonary disease) ([place])    GERD (gastroesophageal reflux disease)    Macular degeneration    Pure hypercholesterolemia    Stroke ([place])    Tobacco abuse    Traumatic amputation of leg(s) (complete) (partial), unilateral, level not specified, without mention of complication 24/26/8341   Right Below Knee Amputation   Unspecified essential hypertension    Past Surgical History:  Past Surgical History:  Procedure Laterality Date   AMPUTATION Right 08/26/2011   Below Knee Amputee   APPENDECTOMY     CARDIAC CATHETERIZATION     CAROTID ANGIOGRAPHY Right 05/14/2016   Procedure: Carotid Angiography;  Surgeon: Katha Cabal, MD;  Location: [facility];  Service: Cardiovascular;  Laterality: Right;   CAROTID PTA/STENT INTERVENTION  05/14/2016   Procedure: Carotid PTA/Stent Intervention;  Surgeon: Katha Cabal, MD;  Location: [facility];  Service: Cardiovascular;;   CORONARY ANGIOPLASTY WITH STENT PLACEMENT  09/28/2006   RCA   CORONARY ANGIOPLASTY WITH STENT PLACEMENT  07/21/2006   Mid LAD   CORONARY ANGIOPLASTY WITH STENT PLACEMENT  06/06/2013   30% prox LAD at the site of a prior stent, 40% mid LAD at the origin of D1, 40% ostial D1, 30% prox LCx at the site of a prior stent, in the proximal third of the vessel segment, 20% prox RCA, 99% distal RCA s/p DES. EF >55%.   HPI:  Patient is a 81 y.o. male with past medical history including HTN, COPD, on  chronic 4L O2 via nasal cannula, BPH, HLD, non-insulin-dependent diabetes mellitus who presented with altered mental status. Found to have multifocal pneumonia and acute left PCA infarcts. Note weight loss reported over the past year. MRI 02/28/22: 1. A few small foci of acute ischemia in the left PCA territory. No  hemorrhage or mass effect.  2. 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 CXR: IMPRESSION:  1. New mild asymmetric nodular opacities in the both lungs,  concerning for multifocal pneumonia.    Assessment / Plan / Recommendation  Clinical Impression  Patient presents with appearance of functional oropharygneal swallowing, however pneumonia and cognitive impairment increase aspiration risk. He is alert and following commands, though during my assessment he appeared impatient and frustrated at times. He complained of not "getting air." RN was called for assessment; pt Sp02 95 on 4L via [place]. He has a congested cough at baseline prior to POs; clear yellow secretions observed along soft palate. No focal weakness or asymmetry noted in oral motor examination. He was able to self-feed thin liquids via cup and straw, puree, and regular solid cracker without overt signs of aspiration. Regarding his weight loss, pt and son-in-law endorsed poor appetite; pt states he does not like the food available to him. Education provided to pt and son-in-law on general aspiration precautions: upright posture, slow rate, small bites and sips, and taking rest breaks when eating if short-of-breath. SLP to follow briefly for tolerance given limited trials  today (due to pt cooperation). Recommend cognitive-linguistic evaluation given new CVA and appearance of cognitive impairments (attention, orientation, impulsivity) during assessment today.    SLP Visit Diagnosis: Dysphagia, unspecified (R13.10)    Aspiration Risk  Mild aspiration risk    Diet Recommendation Regular;Thin liquid   Liquid Administration  via: Cup;Straw Medication Administration: Whole meds with puree (for safer swallowing) Supervision: Patient able to self feed (Assist with set-up/positioning prior to meals) Compensations: Minimize environmental distractions;Slow rate;Small sips/bites Postural Changes: Seated upright at 90 degrees    Other  Recommendations Oral Care Recommendations: Oral care BID    Recommendations for follow up therapy are one component of a multi-disciplinary discharge planning process, led by the attending physician.  Recommendations may be updated based on patient status, additional functional criteria and insurance authorization.  Follow up Recommendations Other (comment) (tbd)      Assistance Recommended at Discharge    Functional Status Assessment Patient has had a recent decline in their functional status and demonstrates the ability to make significant improvements in function in a reasonable and predictable amount of time.  Frequency and Duration min 2x/week  1 week       Prognosis Prognosis for Safe Diet Advancement: Good Barriers to Reach Goals: Cognitive deficits      Swallow Study   General Date of Onset: 02/28/22 HPI: Patient is a 81 y.o. male with past medical history including HTN, COPD, on chronic 4L O2 via nasal cannula, BPH, HLD, non-insulin-dependent diabetes mellitus who presented with altered mental status. Found to have multifocal pneumonia and acute left PCA infarcts. Note weight loss reported over the past year. MRI 02/28/22: 1. A few small foci of acute ischemia in the left PCA territory. No  hemorrhage or mass effect.  2. 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 CXR: IMPRESSION:  1. New mild asymmetric nodular opacities in the both lungs,  concerning for multifocal pneumonia. Type of Study: Bedside Swallow Evaluation Previous Swallow Assessment: screened during 2018 admission: no swallowing difficulties reported after prior CVA Diet Prior to this Study:  NPO Temperature Spikes Noted: No Respiratory Status: Nasal cannula (4L) History of Recent Intubation: No Behavior/Cognition: Alert;Impulsive;Distractible;Requires cueing Oral Cavity Assessment: Within Functional Limits Oral Care Completed by SLP: No Vision: Functional for self-feeding Self-Feeding Abilities: Able to feed self Patient Positioning: Upright in bed Baseline Vocal Quality: Low vocal intensity Volitional Cough: Strong;Congested (coughing intermittently at baseline) Volitional Swallow: Able to elicit    Oral/Motor/Sensory Function Overall Oral Motor/Sensory Function: Within functional limits   Ice Chips Ice chips: Within functional limits Presentation: Spoon   Thin Liquid Thin Liquid: Within functional limits Presentation: Cup;Straw    Nectar Thick Nectar Thick Liquid: Not tested   Honey Thick Honey Thick Liquid: Not tested   Puree Puree: Within functional limits Presentation: Self Fed;Spoon   Solid     Solid: Within functional limits (limited assessment due to pt cooperation) Presentation: [place], [place], [employer] Speech-Language Pathologist Office: [phone] ASCOM: [place] 03/01/2022,2:57 PM

## 2022-03-01 NOTE — Plan of Care (Signed)

## 2022-03-02 ENCOUNTER — Inpatient Hospital Stay
Admit: 2022-03-02 | Discharge: 2022-03-02 | Disposition: A | Discharge status: Home / Self Care | Payer: PPO | Attending: Neurology | Admitting: Neurology

## 2022-03-02 ENCOUNTER — Inpatient Hospital Stay: Payer: PPO

## 2022-03-02 LAB — GLUCOSE, CAPILLARY
Glucose-Capillary: 135 mg/dL — ABNORMAL HIGH (ref 70–99)
Glucose-Capillary: 306 mg/dL — ABNORMAL HIGH (ref 70–99)
Glucose-Capillary: 264 mg/dL — ABNORMAL HIGH (ref 70–99)
Glucose-Capillary: 273 mg/dL — ABNORMAL HIGH (ref 70–99)

## 2022-03-02 MED ORDER — CLOPIDOGREL BISULFATE 75 MG PO TABS
300.0000 mg | ORAL_TABLET | Freq: Once | ORAL | Status: AC
  Administered 2022-03-02: 300 mg via ORAL
  Filled 2022-03-02: qty 4

## 2022-03-02 MED ORDER — METFORMIN HCL 500 MG PO TABS: 1000.0000 mg | ORAL_TABLET | Freq: Two times a day (BID) | ORAL | Status: DC

## 2022-03-02 MED ORDER — CLOPIDOGREL BISULFATE 75 MG PO TABS
75.0000 mg | ORAL_TABLET | Freq: Every day | ORAL | Status: DC
  Administered 2022-03-03: 75 mg via ORAL
  Filled 2022-03-02: qty 1

## 2022-03-02 MED ORDER — TAMSULOSIN HCL 0.4 MG PO CAPS
0.4000 mg | ORAL_CAPSULE | Freq: Every evening | ORAL | Status: DC
  Administered 2022-03-02 – 2022-03-03 (×2): 0.4 mg via ORAL
  Filled 2022-03-02 (×2): qty 1

## 2022-03-02 MED ORDER — IOHEXOL 350 MG/ML SOLN
100.0000 mL | Freq: Once | INTRAVENOUS | Status: AC | PRN
  Administered 2022-03-02: 100 mL via INTRAVENOUS

## 2022-03-02 MED ORDER — SODIUM CHLORIDE 0.9 % IV SOLN
3.0000 g | Freq: Four times a day (QID) | INTRAVENOUS | Status: DC
  Administered 2022-03-02 – 2022-03-03 (×3): 3 g via INTRAVENOUS
  Filled 2022-03-02 (×2): qty 8
  Filled 2022-03-02: qty 3
  Filled 2022-03-02 (×2): qty 8

## 2022-03-02 MED ORDER — AZITHROMYCIN 500 MG PO TABS
500.0000 mg | ORAL_TABLET | Freq: Every day | ORAL | Status: DC
  Administered 2022-03-03: 500 mg via ORAL
  Filled 2022-03-02: qty 1

## 2022-03-02 NOTE — Evaluation (Signed)
Occupational Therapy Evaluation Patient Details Name: Stanley Mclean MRN: [id number] DOB: 08/06/1940 Today's Date: 03/02/2022   History of Present Illness Pt is a 81 y/o M admitted on 02/28/22 after presenting with chief concerns of AMS. Pt is being treated for AMS/acute metabolic encephalopathy in the setting of acute small L PCA territory stroke & PNA. PMH: HTN, COPD, chronic 4L O2 use, BPH, HLD, NIDDM   Clinical Impression   Chart reviewed, pt greeted in room agreeable to OT evaluation. Pt is oriented to self, place, not oriented to date or situation; Pt is impulsive throughout evaluation. Pt presents with deficits in strength, endurance, activity tolerance, balance, cognition all affecting safe and optimal ADL completion. Pt does not have prosthetic leg, wife reports he was not brought in ambulance. PTA pt amb without AD with prosthetic, frequent falls; MOD I for ADL, assist for IADL. Formal BUE assessment limited by pt unwillingness to participate in tasks. Functional transfers completed with CGA to bedside chair. Wife reports they have an aid 6 days a week to assist with IADLs. AT this time recommend discharge home with [facility], frequent/constant supervision. OT will continue to follow acutely.      Recommendations for follow up therapy are one component of a multi-disciplinary discharge planning process, led by the attending physician.  Recommendations may be updated based on patient status, additional functional criteria and insurance authorization.   Follow Up Recommendations  Home health OT     Assistance Recommended at Discharge Frequent or constant Supervision/Assistance  Patient can return home with the following A little help with walking and/or transfers;A little help with bathing/dressing/bathroom    Functional Status Assessment  Patient has had a recent decline in their functional status and demonstrates the ability to make significant improvements in function in a reasonable  and predictable amount of time.  Equipment Recommendations  Other (comment) (per next venue of care)    Recommendations for Other Services       Precautions / Restrictions Precautions Precautions: Fall Precaution Comments: old R BKA Restrictions Weight Bearing Restrictions: No      Mobility Bed Mobility Overal bed mobility: Needs Assistance Bed Mobility: Supine to Sit     Supine to sit: Min assist, HOB elevated          Transfers Overall transfer level: Needs assistance Equipment used: None Transfers: Bed to chair/wheelchair/BSC            Lateral/Scoot Transfers: Min guard General transfer comment: frequent vcs for sequencing/safety      Balance Overall balance assessment: Needs assistance Sitting-balance support: Feet supported Sitting balance-Leahy Scale: Good         Standing balance comment: pt impulsive, refusing to stand without prosthetic                           ADL either performed or assessed with clinical judgement   ADL Overall ADL's : Needs assistance/impaired Eating/Feeding: Minimal assistance;Sitting   Grooming: Wash/dry hands;Wash/dry face;Sitting;Supervision/safety           Upper Body Dressing : Minimal assistance;Sitting   Lower Body Dressing: Maximal assistance;Sitting/lateral leans   Toilet Transfer: Min guard;Cueing for safety;Cueing for sequencing Toilet Transfer Details (indicate cue type and reason): lateral scoot to bedside chair, pt does not have prosthetic at hospital; wife reports she will try to get it to hospital Toileting- Clothing Manipulation and Hygiene: Maximal assistance;Sitting/lateral lean  Vision Patient Visual Report: No change from baseline       Perception     Praxis      Pertinent Vitals/Pain Pain Assessment Pain Assessment: No/denies pain     Hand Dominance Right   Extremity/Trunk Assessment Upper Extremity Assessment Upper Extremity Assessment:  Generalized weakness   Lower Extremity Assessment Lower Extremity Assessment: Generalized weakness   Cervical / Trunk Assessment Cervical / Trunk Assessment: Normal   Communication Communication Communication: No difficulties   Cognition Arousal/Alertness: Awake/alert Behavior During Therapy: Impulsive Overall Cognitive Status: Impaired/Different from baseline Area of Impairment: Orientation, Attention, Memory, Following commands, Safety/judgement, Awareness, Problem solving                 Orientation Level: Disoriented to, Time, Situation Current Attention Level: Focused Memory: Decreased short-term memory Following Commands: Follows one step commands with increased time Safety/Judgement: Decreased awareness of safety, Decreased awareness of deficits Awareness: Intellectual Problem Solving: Slow processing, Difficulty sequencing, Requires verbal cues, Requires tactile cues General Comments: impulsive throughout,able to be redirected     General Comments  Pt on 4L/min via nasal cannula throughout session, PT assisted pt with changing into clean gown.    Exercises     Shoulder Instructions      Home Living Family/patient expects to be discharged to:: Private residence Living Arrangements: Spouse/significant other Available Help at Discharge: Family Type of Home: House Home Access: Stairs to enter;Ramped entrance Entrance Stairs-Number of Steps: 2 Entrance Stairs-Rails: Right;Left Home Layout: One level     Bathroom Shower/Tub: Tub/shower unit         Home Equipment: Conservation officer, nature (2 wheels);Grab bars - toilet;Grab bars - tub/shower;Tub bench;Wheelchair - manual          Prior Functioning/Environment Prior Level of Function : Independent/Modified Independent             Mobility Comments: amb with no AD with prosthetic leg, frequent falls the last 3 weeks per his wife ADLs Comments: MOD I with ADL, assist with IADL as needed        OT Problem  List: Decreased strength;Decreased activity tolerance;Impaired balance (sitting and/or standing);Decreased cognition;Decreased knowledge of use of DME or AE      OT Treatment/Interventions: Self-care/ADL training;Patient/family education;Therapeutic exercise;Balance training;Energy conservation;Therapeutic activities;DME and/or AE instruction    OT Goals(Current goals can be found in the care plan section) Acute Rehab OT Goals Patient Stated Goal: get out of here OT Goal Formulation: With patient Time For Goal Achievement: 03/16/22 Potential to Achieve Goals: Good  OT Frequency: Min 3X/week    Co-evaluation              AM-PAC OT "6 Clicks" Daily Activity     Outcome Measure Help from another person eating meals?: A Little Help from another person taking care of personal grooming?: A Little Help from another person toileting, which includes using toliet, bedpan, or urinal?: A Lot Help from another person bathing (including washing, rinsing, drying)?: A Lot Help from another person to put on and taking off regular upper body clothing?: A Little Help from another person to put on and taking off regular lower body clothing?: A Lot 6 Click Score: 15   End of Session Equipment Utilized During Treatment: Oxygen Nurse Communication: Mobility status  Activity Tolerance: Patient tolerated treatment well Patient left: in chair;with call bell/phone within reach;with chair alarm set  OT Visit Diagnosis: Muscle weakness (generalized) (M62.81);Repeated falls (R29.6)  Time: 1464-3142 OT Time Calculation (min): 17 min Charges:  OT General Charges $OT Visit: 1 Visit OT Evaluation $OT Eval Moderate Complexity: 1 Mod  Shanon Payor, OTD OTR/L  03/02/22, 11:17 AM

## 2022-03-02 NOTE — Plan of Care (Signed)
  Lab Results  Component Value Date   HGBA1C 7.4 (H) 02/23/2021   Lab Results  Component Value Date   CHOL 166 03/01/2022   HDL 78 03/01/2022   LDLCALC 65 03/01/2022   TRIG 113 03/01/2022   CHOLHDL 2.1 03/01/2022   CTA head and neck 1. No emergent large vessel occlusion or high-grade stenosis of the intracranial arteries. 2. Approximately 60% stenosis of the proximal right ICA secondary to predominantly calcified atherosclerosis. 3. Abnormalities within the chest are better characterized on the chest CT. [Aortic arch atherosclerosis present as well as vertebral atherosclerosis, nonflow limiting]  CT Chest/ab/pelvis w/ contrast 1. New thick-walled cavitary lesion in the left upper lobe measuring up to 5.4 cm. There is some surrounding patchy airspace disease. Findings are worrisome for cavitary neoplasm. Infection is also in the differential. 2. New patchy areas of airspace disease peripherally in the bilateral upper lobes, right middle lobe and right lower lobe worrisome for multifocal pneumonia. 3. Trace bilateral pleural effusions. 4. Severe emphysema. 5. Nonobstructing right renal calculus. 6. Colonic diverticulosis. 7. Bilateral renal cysts. One of the cysts in the right kidney is mildly hyperdense and may represent hemorrhagic or proteinaceous cyst. This can be further evaluated with ultrasound. 8. Large amount of stool throughout the colon. 9. There is a 3 mm nodule in the left lower lobe which is new from prior, indeterminate given other findings.  ECHO read pending  Assessment: Embolic stroke, possibly atheroembolic from aortic arch atherosclerosis versus cardioembolic, given chest CT findings hypercoagulable state of malignancy may also be playing a role  Recs:  - Start plavix 300 mg once followed by 75 mg daily, 21 day course but if needs biopsy sooner okay to stop after 1 week as majority of the benefit is in the first week  - LDL meeting goal of less than 70  (65) - A1c slightly above goal at 7.4%, adjust medications and diet as needed for goal less than 7% - Long-term blood pressure goal normotension - Further malignancy workup and management per primary team - Appreciate management of comorbidities per primary team, noting reports of improving mentation with treatment of his pneumonia - B12 level test for reversible causes of cognitive impairment, supplement for goal greater than 400 - Neurology will follow-up echocardiogram but otherwise will be available as needed going forward, please reach out if any additional questions or concerns arise

## 2022-03-02 NOTE — Progress Notes (Addendum)
[place] [facility] [place] NAME: Stanley Mclean    MR#:  [id number]  DATE OF BIRTH:  02/04/1941  SUBJECTIVE:  Came in with altered mental status found to have stroke and pneumonia. Mentation much better. Patient answered most basic questions appropriately. Some cough. No fever. Patient wondering when he can go home. Foley to be removed  VITALS:  Blood pressure (!) 189/82, pulse 73, temperature 98.2 F (36.8 C), resp. rate 16, weight 52.8 kg, SpO2 95 %.  PHYSICAL EXAMINATION:   GENERAL:  81 y.o.-year-old patient lying in the bed with no acute distress. Weak, frail LUNGS: coarse breath sounds bilaterally, no wheezing CARDIOVASCULAR: S1, S2 normal. No murmur   ABDOMEN: Soft, nontender, nondistended. Bowel sounds present.  EXTREMITIES: No  edema b/l.    NEUROLOGIC: nonfocal  patient is alert and awake, gen weakness SKIN: No obvious rash, lesion, or ulcer.   LABORATORY PANEL:  CBC Recent Labs  Lab 03/01/22 0639  WBC 11.7*  HGB 11.9*  HCT 37.0*  PLT 221     Chemistries  Recent Labs  Lab 02/28/22 1107 02/28/22 1305 03/01/22 0645  NA 140  --  140  K 4.1  --  3.6  CL 95*  --  100  CO2 35*  --  32  GLUCOSE 230*  --  173*  BUN 27*  --  14  CREATININE 1.06  --  0.86  CALCIUM 9.2  --  8.5*  MG  --    < > 1.9  AST 27  --   --   ALT 22  --   --   ALKPHOS 93  --   --   BILITOT 0.6  --   --    < > = values in this interval not displayed.    Cardiac Enzymes No results for input(s): "TROPONINI" in the last 168 hours. RADIOLOGY:  CT ANGIO HEAD NECK W WO CM  Result Date: 03/02/2022 CLINICAL DATA:  Stroke/TIA EXAM: CT ANGIOGRAPHY HEAD AND NECK TECHNIQUE: Multidetector CT imaging of the head and neck was performed using the standard protocol during bolus administration of intravenous contrast. Multiplanar CT image reconstructions and MIPs were obtained to evaluate the vascular anatomy. Carotid stenosis measurements (when applicable) are  obtained utilizing NASCET criteria, using the distal internal carotid diameter as the denominator. RADIATION DOSE REDUCTION: This exam was performed according to the departmental dose-optimization program which includes automated exposure control, adjustment of the mA and/or kV according to patient size and/or use of iterative reconstruction technique. CONTRAST:  147m OMNIPAQUE IOHEXOL 350 MG/ML SOLN COMPARISON:  02/28/2022 FINDINGS: CT HEAD FINDINGS Brain: There is no mass, hemorrhage or extra-axial collection. There is generalized atrophy without lobar predilection. There is hypoattenuation of the periventricular white matter, most commonly indicating chronic ischemic microangiopathy. Skull: The visualized skull base, calvarium and extracranial soft tissues are normal. Sinuses/Orbits: No fluid levels or advanced mucosal thickening of the visualized paranasal sinuses. No mastoid or middle ear effusion. The orbits are normal. CTA NECK FINDINGS SKELETON: There is no bony spinal canal stenosis. No lytic or blastic lesion. OTHER NECK: Normal pharynx, larynx and major salivary glands. No cervical lymphadenopathy. Unremarkable thyroid gland. UPPER CHEST: Emphysema. Multifocal cavitation in the left upper lobe. AORTIC ARCH: There is calcific atherosclerosis of the aortic arch. There is no aneurysm, dissection or hemodynamically significant stenosis of the visualized portion of the aorta. Conventional 3 vessel aortic branching pattern. The visualized proximal subclavian arteries are widely patent. RIGHT CAROTID SYSTEM:  No dissection, occlusion or aneurysm. There is predominantly calcified atherosclerosis extending into the proximal ICA, resulting in 60% stenosis. Mild calcific atherosclerosis in the common carotid artery without stenosis. LEFT CAROTID SYSTEM: No dissection, occlusion or aneurysm. Mild atherosclerotic calcification at the carotid bifurcation without hemodynamically significant stenosis. Mild  atherosclerosis within the left common carotid artery. VERTEBRAL ARTERIES: Right dominant configuration. Both origins are clearly patent. There is no dissection, occlusion or flow-limiting stenosis to the skull base (V1-V3 segments). CTA HEAD FINDINGS POSTERIOR CIRCULATION: --Vertebral arteries: Normal V4 segments. --Inferior cerebellar arteries: Normal. --Basilar artery: Normal. --Superior cerebellar arteries: Normal. --Posterior cerebral arteries (PCA): Normal. ANTERIOR CIRCULATION: --Intracranial internal carotid arteries: Atherosclerotic calcification of the internal carotid arteries at the skull base without hemodynamically significant stenosis. --Anterior cerebral arteries (ACA): Normal. Both A1 segments are present. Patent anterior communicating artery (a-comm). --Middle cerebral arteries (MCA): Normal. VENOUS SINUSES: As permitted by contrast timing, patent. ANATOMIC VARIANTS: None Review of the MIP images confirms the above findings. IMPRESSION: 1. No emergent large vessel occlusion or high-grade stenosis of the intracranial arteries. 2. Approximately 60% stenosis of the proximal right ICA secondary to predominantly calcified atherosclerosis. 3. Abnormalities within the chest are better characterized on the chest CT. Aortic atherosclerosis (ICD10-I70.0). Electronically Signed   By: Ulyses Jarred M.D.   On: 03/02/2022 01:57   CT CHEST ABDOMEN PELVIS W CONTRAST  Result Date: 03/02/2022 CLINICAL DATA:  Sepsis. EXAM: CT CHEST, ABDOMEN, AND PELVIS WITH CONTRAST TECHNIQUE: Multidetector CT imaging of the chest, abdomen and pelvis was performed following the standard protocol during bolus administration of intravenous contrast. RADIATION DOSE REDUCTION: This exam was performed according to the departmental dose-optimization program which includes automated exposure control, adjustment of the mA and/or kV according to patient size and/or use of iterative reconstruction technique. CONTRAST:  125m OMNIPAQUE  IOHEXOL 350 MG/ML SOLN COMPARISON:  CT chest 04/03/2021 FINDINGS: CT CHEST FINDINGS Cardiovascular: No significant vascular findings. Normal heart size. No pericardial effusion. There are atherosclerotic calcifications of the aorta and coronary arteries. Mediastinum/Nodes: No enlarged mediastinal, hilar, or axillary lymph nodes. Thyroid gland, trachea, and esophagus demonstrate no significant findings. Lungs/Pleura: Severe emphysematous changes are again seen. Are new patchy areas of airspace disease peripherally in the bilateral upper lobes, right middle lobe and right lower lobe. There is a new thick-walled cavitary lesion in the left upper lobe abutting the pleura measuring 4.9 x 3.2 x 5.4 cm. There is some surrounding patchy airspace disease. This appears contiguous with oblong airspace opacity in the left upper lobe abutting the pleura image 8/50 measuring 1.9 x 3.4 by 1.5 cm. There is a 3 mm nodule in the left lower lobe image 8/79 which is new from prior. There are trace bilateral pleural effusions. No pneumothorax. Musculoskeletal: No chest wall mass or suspicious bone lesions identified. CT ABDOMEN PELVIS FINDINGS Hepatobiliary: No focal liver abnormality is seen. No gallstones, gallbladder wall thickening, or biliary dilatation. Pancreas: Unremarkable. No pancreatic ductal dilatation or surrounding inflammatory changes. Spleen: Normal in size without focal abnormality. Adrenals/Urinary Tract: There are numerous bilateral renal cysts. The largest is in the left kidney measuring 4 cm. There is a mildly hyperdense likely cysts in the right kidney measuring 16 mm. There is no hydronephrosis or perinephric fat stranding. There is a 3 mm calculus in the right kidney. The adrenal glands are within normal limits. The bladder is decompressed by Foley catheter. Stomach/Bowel: There are postsurgical changes in the colon, likely hemicolectomy. There is a large amount of stool throughout the colon. There is  no bowel  obstruction, pneumatosis or free air. There is sigmoid colon diverticulosis. The stomach is decompressed. Vascular/Lymphatic: Aortic atherosclerosis. No enlarged abdominal or pelvic lymph nodes. Reproductive: Prostate is unremarkable. Other: No abdominal wall hernia or abnormality. No abdominopelvic ascites. Musculoskeletal: No acute or significant osseous findings. IMPRESSION: 1. New thick-walled cavitary lesion in the left upper lobe measuring up to 5.4 cm. There is some surrounding patchy airspace disease. Findings are worrisome for cavitary neoplasm. Infection is also in the differential. 2. New patchy areas of airspace disease peripherally in the bilateral upper lobes, right middle lobe and right lower lobe worrisome for multifocal pneumonia. 3. Trace bilateral pleural effusions. 4. Severe emphysema. 5. Nonobstructing right renal calculus. 6. Colonic diverticulosis. 7. Bilateral renal cysts. One of the cysts in the right kidney is mildly hyperdense and may represent hemorrhagic or proteinaceous cyst. This can be further evaluated with ultrasound. 8. Large amount of stool throughout the colon. 9. There is a 3 mm nodule in the left lower lobe which is new from prior, indeterminate given other findings. Aortic Atherosclerosis (ICD10-I70.0) and Emphysema (ICD10-J43.9). Electronically Signed   By: Ronney Asters M.D.   On: 03/02/2022 01:54   MR BRAIN WO CONTRAST  Result Date: 02/28/2022 CLINICAL DATA:  Altered mental status EXAM: MRI HEAD WITHOUT CONTRAST TECHNIQUE: Multiplanar, multiecho pulse sequences of the brain and surrounding structures were obtained without intravenous contrast. COMPARISON:  None Available. FINDINGS: Brain: There are a few small foci of abnormal diffusion restriction in the left PCA territory. Fewer than 5 scattered microhemorrhages in a nonspecific pattern. There is multifocal hyperintense T2-weighted signal within the white matter. Generalized volume loss. Multiple old small vessel  infarcts of the corona radiata. The midline structures are normal. Vascular: Major flow voids are preserved. Skull and upper cervical spine: Normal calvarium and skull base. Visualized upper cervical spine and soft tissues are normal. Sinuses/Orbits:No paranasal sinus fluid levels or advanced mucosal thickening. No mastoid or middle ear effusion. Normal orbits. IMPRESSION: 1. A few small foci of acute ischemia in the left PCA territory. No hemorrhage or mass effect. 2. 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 Electronically Signed   By: Ulyses Jarred M.D.   On: 02/28/2022 20:03    Assessment and Plan Zerick Prevette is a 81 year old male with history of hypertension, COPD, on chronic 4 L nasal cannula, BPH, hyperlipidemia, non-insulin-dependent diabetes mellitus, who presents emergency department for chief concerns of altered mental status.  Per spouse at bedside, patient was at his baseline self last night.  She noticed the altered mental status at 5 AM on day of admission.  Altered mental status/acute metabolic encephalopathy in the setting of acute small left PCA territory stroke and pneumonia -- patient's mentation is much improved. No neural deficit. -- Seen by neurology Dr. Curly Shores-- recommends CTA head and neck, CT chest abdomen and pelvis with contrast for weight loss eval -- aspirin 81 mg daily -- Plavix 300 mg load with 75 mg daily for 21 days and then only asa -- PT OT and speech to evaluate --CT angio head/neck--right 60% stenosis Right ICA--consider out pt vascular f/u  Multifocal pneumonia Cavitary lesion neoplasm vs infection and multifocal PNA on CT chest chronic respiratory failure secondary to severe emphysema on oxygen 3.5 liters chronically at home -- continue IV antibiotics, inhalers, oxygen, bronchodilator --IV unasyn, zithromax  Hypertension -- continue blood pressure meds  Type II diabetes, hyperglycemia -- sliding scale insulin for now -- will resume  metformin or discharge  Acute  urinary retention/?BPH -- patient had in and out catheter and foley placed in ER --d/c foley --empiric flomax for now  CAD status post stent -- aspirin, statin, Plavix -- amlodipine, lisinopril, metoprolol  Procedures: Family communication : daughter Stanley Mclean on the phone 12/17 Consults : neurology CODE STATUS: full DVT Prophylaxis : Lovenox Level of care: Telemetry Medical Status is: Inpatient Remains inpatient appropriate because: CVA, Pneumonia   if continues to show improvement will discharge tomorrow.  TOTAL TIME TAKING CARE OF THIS PATIENT: 35 minutes.  >50% time spent on counselling and coordination of care  Note: This dictation was prepared with Dragon dictation along with smaller phrase technology. Any transcriptional errors that result from this process are unintentional.  Fritzi Mandes M.D    [facility]   CC: Primary care physician; Olen Pel, NP

## 2022-03-02 NOTE — Evaluation (Signed)
Speech Language Pathology Evaluation Patient Details Name: Stanley Mclean MRN: [id number] DOB: 08-20-1940 Today's Date: 03/02/2022 Time: 1040-1100 SLP Time Calculation (min) (ACUTE ONLY): 20 min  Problem List:  Patient Active Problem List   Diagnosis Date Noted   Cerebrovascular accident (CVA) due to thrombosis of left posterior cerebral artery ([place]) 35/36/1443   Acute metabolic encephalopathy 15/40/0867   Chronic respiratory failure with hypoxia ([place]) 03/01/2022   Altered mental status 02/28/2022   Elevated troponin 02/28/2022   Urinary retention 02/28/2022   Hypomagnesemia 02/28/2022   Pressure injury of skin 02/23/2021   COPD with acute exacerbation ([place])    Community acquired pneumonia    Acute respiratory failure with hypoxia ([place])    Renal cyst, acquired, right 11/24/2019   Chronic bilateral low back pain with left-sided sciatica 10/16/2017   Phantom limb syndrome with pain ([place]) 07/29/2017   Primary osteoarthritis involving multiple joints 07/29/2017   Hyperlipidemia 11/19/2016   Carotid artery stenosis 05/05/2016   History of CVA (cerebrovascular accident) 03/30/2016   Centrilobular emphysema ([name]) 03/27/2015   Asthmatic bronchitis 01/05/2015   Sensation of cold in leg 06/19/2014   Essential hypertension 06/22/2013   CAD (coronary artery disease) 06/01/2013   SOB (shortness of breath) 06/01/2013   Smoker 06/01/2013   S/P coronary artery stent placement 06/01/2013   Diabetes mellitus type 2, noninsulin dependent ([place]) 06/01/2013   Below knee amputation status, right ([place]) 06/01/2013   Past Medical History:  Past Medical History:  Diagnosis Date   Anemia    Basal cell carcinoma    BPH (benign prostatic hyperplasia)    CAD (coronary artery disease)    a. s/p multiple prior PCIs b. s/p DES-distal RCA 05/2013   COPD (chronic obstructive pulmonary disease) (HCC)    GERD (gastroesophageal reflux disease)    Macular degeneration    Pure hypercholesterolemia     Stroke ([place])    Tobacco abuse    Traumatic amputation of leg(s) (complete) (partial), unilateral, level not specified, without mention of complication 61/95/0932   Right Below Knee Amputation   Unspecified essential hypertension    Past Surgical History:  Past Surgical History:  Procedure Laterality Date   AMPUTATION Right 08/26/2011   Below Knee Amputee   APPENDECTOMY     CARDIAC CATHETERIZATION     CAROTID ANGIOGRAPHY Right 05/14/2016   Procedure: Carotid Angiography;  Surgeon: Katha Cabal, MD;  Location: [facility];  Service: Cardiovascular;  Laterality: Right;   CAROTID PTA/STENT INTERVENTION  05/14/2016   Procedure: Carotid PTA/Stent Intervention;  Surgeon: Katha Cabal, MD;  Location: [facility] [facility];  Service: Cardiovascular;;   CORONARY ANGIOPLASTY WITH STENT PLACEMENT  09/28/2006   RCA   CORONARY ANGIOPLASTY WITH STENT PLACEMENT  07/21/2006   Mid LAD   CORONARY ANGIOPLASTY WITH STENT PLACEMENT  06/06/2013   30% prox LAD at the site of a prior stent, 40% mid LAD at the origin of D1, 40% ostial D1, 30% prox LCx at the site of a prior stent, in the proximal third of the vessel segment, 20% prox RCA, 99% distal RCA s/p DES. EF >55%.   HPI:  Patient is a 81 y.o. male with past medical history including HTN, COPD, on chronic 4L O2 via nasal cannula, BPH, HLD, non-insulin-dependent diabetes mellitus who presented with altered mental status. Found to have multifocal pneumonia and acute left PCA infarcts. Note weight loss reported over the past year. MRI 02/28/22: 1. A few small foci of acute ischemia in the left PCA territory.  No  hemorrhage or mass effect.  2. 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 CXR: IMPRESSION:  1. New mild asymmetric nodular opacities in the both lungs,  concerning for multifocal pneumonia.   Assessment / Plan / Recommendation Clinical Impression  Pt seen for skilled SLP services targeting cognitive-linguistic evaluation and  diet tolerance. Pt received upright in recliner. "Another one of you?" Pt required encouragement for participation in SLP services.   Assessment completed via informal means and portions of Cognistat. Pt oriented to self only. Pt presents with cognitive-linguistic deficits affecting attention (sustained; requiring frequent redirection), memory (short term; functional), problem solving (verbal/non-verbal), reasoning (abstract), and insight. Impulsivity noted. Pt's speech is fluent and without s/sx anomia or dysarthria; however, pt required cues for elaboration. Pt also with difficulty comprehending 2-step directives and complex yes/no questions. Additionally, pt with social communication deficits including flat affect and reduced topic maintenance.  Pt seen for diet tolerance. Pt observed consuming x2 packages of graham crackers with peanut butter and ~4 oz of cola via cup sip. Pt demonstrated an intact oral swallow. Pharyngeal swallow appeared [facility]. Recommend continuation of current diet and safe swallowing strategies.   Based on today's assessment, anticipate need for frequent/constant supervision/assistance and post-acute SLP services for cognitive-linguistic deficits. SLP to f/u per POC for SLP services while pt in house.   RN made aware of results, recommendations, and SLP POC.     SLP Assessment  SLP Recommendation/Assessment: Patient needs continued Speech Lanaguage Pathology Services SLP Visit Diagnosis: Dysphagia, unspecified (R13.10);Cognitive communication deficit (R41.841)    Recommendations for follow up therapy are one component of a multi-disciplinary discharge planning process, led by the attending physician.  Recommendations may be updated based on patient status, additional functional criteria and insurance authorization.    Follow Up Recommendations  Home health SLP    Assistance Recommended at Discharge  Frequent or constant Supervision/Assistance  Functional Status Assessment  Patient has had a recent decline in their functional status and demonstrates the ability to make significant improvements in function in a reasonable and predictable amount of time.  Frequency and Duration min 2x/week  2 weeks      SLP Evaluation Cognition  Overall Cognitive Status: No family/caregiver present to determine baseline cognitive functioning Arousal/Alertness: Awake/alert Orientation Level: Oriented to person;Disoriented to place;Disoriented to time;Disoriented to situation Memory: Impaired (unable to code and recall x4 unrelated words on Memory subtest) Memory Impairment: Storage deficit;Retrieval deficit;Decreased recall of new information Awareness: Impaired ("I can go home now.") Awareness Impairment: Intellectual impairment Problem Solving: Impaired Problem Solving Impairment: Verbal basic;Functional basic (1/3 on Judgement subtest) Safety/Judgment: Impaired (stating he can d/c home without assistance)       Comprehension  Auditory Comprehension Overall Auditory Comprehension: Impaired Yes/No Questions: Impaired Complex Questions: 50-74% accurate Commands: Impaired Two Step Basic Commands: 75-100% accurate Multistep Basic Commands: 50-74% accurate    Expression Expression Primary Mode of Expression: Verbal Verbal Expression Overall Verbal Expression: Appears within functional limits for tasks assessed Initiation: No impairment Level of Generative/Spontaneous Verbalization:  (cues for elaboration) Repetition: No impairment Naming:  (WFL confrontation naming) Pragmatics: Impairment Impairments: Abnormal affect;Topic maintenance Interfering Components: Attention Written Expression Dominant Hand: Right   Oral / Motor  Oral Motor/Sensory Function Overall Oral Motor/Sensory Function: Within functional limits Motor Speech Overall Motor Speech: Appears within functional limits for tasks assessed Respiration: Within functional limits Resonance: Within functional  limits Articulation: Within functional limitis Intelligibility: Intelligible Motor Planning: Witnin functional limits  Cherrie Gauze, M.S., [facility] [phone] [place])  Quintella Baton 03/02/2022, 12:50 PM

## 2022-03-02 NOTE — Evaluation (Signed)
Physical Therapy Evaluation Patient Details Name: Stanley Mclean MRN: [id number] DOB: Jun 30, 1940 Today's Date: 03/02/2022  History of Present Illness  Pt is a 81 y/o M admitted on 02/28/22 after presenting with chief concerns of AMS. Pt is being treated for AMS/acute metabolic encephalopathy in the setting of acute small L PCA territory stroke & PNA. PMH: HTN, COPD, chronic 4L O2 use, BPH, HLD, NIDDM  Clinical Impression  Pt seen for PT evaluation with pt agreeable to tx. Pt reports prior to admission he was living in a mobile home with ramped entry at back. Pt notes he is ambulatory with prosthetic leg without AD. Pt does not have prosthetic in room with him (PT contacted ED staff & wife in attempts to locate it). Pt is able to transfer STS x2 with RW & min assist with cuing for safe hand placement but other mobility limited by pt's unease without prosthetic. Anticipate pt can d/c home with HHPT f/u but will continue to follow pt acutely to address balance, gait, and activity tolerance, & safety awareness.     Recommendations for follow up therapy are one component of a multi-disciplinary discharge planning process, led by the attending physician.  Recommendations may be updated based on patient status, additional functional criteria and insurance authorization.  Follow Up Recommendations Home health PT      Assistance Recommended at Discharge Frequent or constant Supervision/Assistance  Patient can return home with the following  A little help with walking and/or transfers;A little help with bathing/dressing/bathroom;Assistance with cooking/housework;Assist for transportation;Help with stairs or ramp for entrance    Equipment Recommendations None recommended by PT  Recommendations for Other Services       Functional Status Assessment Patient has had a recent decline in their functional status and demonstrates the ability to make significant improvements in function in a reasonable and  predictable amount of time.     Precautions / Restrictions Precautions Precautions: Fall Precaution Comments: old R BKA Restrictions Weight Bearing Restrictions: No      Mobility  Bed Mobility               General bed mobility comments: not observed, pt received & left sitting in recliner    Transfers Overall transfer level: Needs assistance Equipment used: Rolling walker (2 wheels) Transfers: Sit to/from Stand Sit to Stand: Min assist           General transfer comment: cuing for safe hand placement to push to standing & transfer UE to RW    Ambulation/Gait                  Stairs            Wheelchair Mobility    Modified Rankin (Stroke Patients Only)       Balance Overall balance assessment: Needs assistance Sitting-balance support: Feet supported Sitting balance-Leahy Scale: Good     Standing balance support: Bilateral upper extremity supported, During functional activity, Reliant on assistive device for balance Standing balance-Leahy Scale: Fair                               Pertinent Vitals/Pain Pain Assessment Pain Assessment: No/denies pain    Home Living Family/patient expects to be discharged to:: Private residence Living Arrangements: Spouse/significant other Available Help at Discharge: Family Type of Home: House Home Access: Stairs to enter;Ramped entrance Entrance Stairs-Rails: Right;Left Entrance Stairs-Number of Steps: 2   Home Layout: One level Home  Equipment: Conservation officer, nature (2 wheels);Grab bars - toilet;Grab bars - tub/shower;Tub bench;Wheelchair - manual      Prior Function Prior Level of Function : Independent/Modified Independent             Mobility Comments: amb with no AD with prosthetic leg, frequent falls the last 3 weeks per his wife ADLs Comments: MOD I with ADL, assist with IADL as needed     Hand Dominance   Dominant Hand: Right    Extremity/Trunk Assessment   Upper  Extremity Assessment Upper Extremity Assessment: Generalized weakness    Lower Extremity Assessment Lower Extremity Assessment: Generalized weakness    Cervical / Trunk Assessment Cervical / Trunk Assessment: Normal  Communication   Communication: No difficulties  Cognition Arousal/Alertness: Awake/alert Behavior During Therapy: WFL for tasks assessed/performed Overall Cognitive Status: No family/caregiver present to determine baseline cognitive functioning Area of Impairment: Attention, Memory, Following commands, Safety/judgement, Awareness, Problem solving                     Memory: Decreased short-term memory Following Commands: Follows one step commands with increased time Safety/Judgement: Decreased awareness of safety, Decreased awareness of deficits Awareness: Emergent Problem Solving: Slow processing, Difficulty sequencing, Requires verbal cues, Requires tactile cues General Comments: Pt easily irritated, requires education for participation.        General Comments General comments (skin integrity, edema, etc.): Pt on 4L/min via nasal cannula throughout session, PT assisted pt with changing into clean gown.    Exercises     Assessment/Plan    PT Assessment Patient needs continued PT services  PT Problem List Decreased strength;Decreased activity tolerance;Decreased knowledge of use of DME;Decreased safety awareness;Decreased balance;Decreased mobility       PT Treatment Interventions DME instruction;Therapeutic exercise;Gait training;Balance training;Stair training;Neuromuscular re-education;Functional mobility training;Patient/family education;Therapeutic activities;Cognitive remediation;Modalities;Manual techniques    PT Goals (Current goals can be found in the Care Plan section)  Acute Rehab PT Goals Patient Stated Goal: find prosthetic leg PT Goal Formulation: With patient Time For Goal Achievement: 03/16/22 Potential to Achieve Goals: Good     Frequency 7X/week     Co-evaluation               AM-PAC PT "6 Clicks" Mobility  Outcome Measure Help needed turning from your back to your side while in a flat bed without using bedrails?: A Little Help needed moving from lying on your back to sitting on the side of a flat bed without using bedrails?: A Little Help needed moving to and from a bed to a chair (including a wheelchair)?: A Little Help needed standing up from a chair using your arms (e.g., wheelchair or bedside chair)?: A Little Help needed to walk in hospital room?: A Little Help needed climbing 3-5 steps with a railing? : A Lot 6 Click Score: 17    End of Session Equipment Utilized During Treatment: Oxygen Activity Tolerance: Patient tolerated treatment well Patient left: in chair;with chair alarm set;with call bell/phone within reach Nurse Communication: Mobility status PT Visit Diagnosis: Muscle weakness (generalized) (M62.81);Unsteadiness on feet (R26.81);Difficulty in walking, not elsewhere classified (R26.2)    Time: 5789-7847 PT Time Calculation (min) (ACUTE ONLY): 14 min   Charges:   PT Evaluation $PT Eval Moderate Complexity: 1 Mod          Lavone Nian, PT, DPT 03/02/22, 9:57 AM   Waunita Schooner 03/02/2022, 9:55 AM

## 2022-03-03 ENCOUNTER — Inpatient Hospital Stay
Admit: 2022-03-03 | Discharge: 2022-03-03 | Disposition: A | Discharge status: Home / Self Care | Payer: PPO | Attending: Neurology | Admitting: Neurology

## 2022-03-03 ENCOUNTER — Inpatient Hospital Stay (HOSPITAL_COMMUNITY)
Admit: 2022-03-03 | Discharge: 2022-03-03 | Disposition: A | Discharge status: Home / Self Care | Payer: PPO | Attending: Neurology | Admitting: Neurology

## 2022-03-03 DIAGNOSIS — I6389 Other cerebral infarction: Secondary | ICD-10-CM

## 2022-03-03 DIAGNOSIS — E119 Type 2 diabetes mellitus without complications: Secondary | ICD-10-CM

## 2022-03-03 LAB — PROCALCITONIN: Procalcitonin: 0.11 ng/mL

## 2022-03-03 LAB — VITAMIN B12: Vitamin B-12: 445 pg/mL (ref 180–914)

## 2022-03-03 LAB — GLUCOSE, CAPILLARY
Glucose-Capillary: 260 mg/dL — ABNORMAL HIGH (ref 70–99)
Glucose-Capillary: 165 mg/dL — ABNORMAL HIGH (ref 70–99)

## 2022-03-03 LAB — ECHOCARDIOGRAM COMPLETE
AR max vel: 1.58 cm2
AV Area VTI: 1.72 cm2
AV Area mean vel: 1.6 cm2
AV Mean grad: 8 mmHg
AV Peak grad: 13.4 mmHg
Ao pk vel: 1.83 m/s
Area-P 1/2: 4.45 cm2
S' Lateral: 2.4 cm
Weight: 1862.45 oz

## 2022-03-03 LAB — HEMOGLOBIN A1C
Hgb A1c MFr Bld: 9 % — ABNORMAL HIGH (ref 4.8–5.6)
Mean Plasma Glucose: 212 mg/dL

## 2022-03-03 MED ORDER — AMOXICILLIN-POT CLAVULANATE 875-125 MG PO TABS: 1.0000 | ORAL_TABLET | Freq: Two times a day (BID) | ORAL | 0 refills | Status: AC

## 2022-03-03 MED ORDER — AZITHROMYCIN 500 MG PO TABS: 500.0000 mg | ORAL_TABLET | Freq: Every day | ORAL | 0 refills | Status: AC

## 2022-03-03 MED ORDER — CLOPIDOGREL BISULFATE 75 MG PO TABS: 75.0000 mg | ORAL_TABLET | Freq: Every day | ORAL | 0 refills | Status: AC

## 2022-03-03 MED ORDER — SENNOSIDES-DOCUSATE SODIUM 8.6-50 MG PO TABS: 1.0000 | ORAL_TABLET | Freq: Two times a day (BID) | ORAL | 0 refills | Status: AC

## 2022-03-03 MED ORDER — ALBUTEROL SULFATE HFA 108 (90 BASE) MCG/ACT IN AERS: 2.0000 | INHALATION_SPRAY | Freq: Four times a day (QID) | RESPIRATORY_TRACT | 2 refills | Status: AC | PRN

## 2022-03-03 MED ORDER — AMOXICILLIN-POT CLAVULANATE 875-125 MG PO TABS
1.0000 | ORAL_TABLET | Freq: Two times a day (BID) | ORAL | Status: DC
  Administered 2022-03-03: 1 via ORAL
  Filled 2022-03-03: qty 1

## 2022-03-03 NOTE — Care Management Important Message (Signed)
Important Message  Patient Details  Name: Stanley Mclean MRN: [id number] Date of Birth: [birth year]   Medicare Important Message Given:  N/A - LOS <3 / Initial given by admissions     [name] 03/03/2022, 10:32 AM

## 2022-03-03 NOTE — Plan of Care (Signed)
  Problem: Activity: Goal: Risk for activity intolerance will decrease Outcome: Progressing   Problem: Pain Managment: Goal: General experience of comfort will improve Outcome: Progressing   Problem: Safety: Goal: Ability to remain free from injury will improve Outcome: Progressing   Problem: Nutrition: Goal: Risk of aspiration will decrease Outcome: Progressing

## 2022-03-03 NOTE — Discharge Summary (Signed)
Physician Discharge Summary   Patient: Stanley Mclean MRN: [id number] DOB: 02-Jul-1940  Admit date:     02/28/2022  Discharge date: 03/03/22  Discharge Physician: Fritzi Mandes   PCP: Olen Pel, NP   Recommendations at discharge:   follow-up pulmonary Dr.Aleskerov for evaluation of left upper lobe lung lesion is outpatient on the appointment that will be made -- [facility]urse practitioner on your schedule appointment use your oxygen, inhalers, nebulizers before  smoking cessation advised   Discharge Diagnoses: Principal Problem:   Altered mental status Active Problems:   CAD (coronary artery disease)   SOB (shortness of breath)   S/P coronary artery stent placement   Diabetes mellitus type 2, noninsulin dependent ([place])   Essential hypertension   Centrilobular emphysema (H[facility])   History of CVA (cerebrovascular accident)   Hyperlipidemia   Phantom limb syndrome with pain ([place])   Community acquired pneumonia   Elevated troponin   Urinary retention   Hypomagnesemia   Cerebrovascular accident (CVA) due to thrombosis of left posterior cerebral artery ([place])   Acute metabolic encephalopathy   Chronic respiratory failure with hypoxia [facility])   Hospital Course:  Stanley Mclean is a 81 year old male with history of hypertension, COPD, on chronic 4 L nasal cannula, BPH, hyperlipidemia, non-insulin-dependent diabetes mellitus, who presents emergency department for chief concerns of altered mental status.  Per spouse at bedside, patient was at his baseline self last night.  She noticed the altered mental status at 5 AM on day of admission.   Altered mental status/acute metabolic encephalopathy in the setting of acute small left PCA territory stroke and pneumonia -- patient's mentation is much improved. No neural deficit. -- Seen by neurology Dr. Curly Shores-- recommends CTA head and neck, CT chest abdomen and pelvis with contrast for weight loss eval -- aspirin 81 mg daily --  Plavix 300 mg load with 75 mg daily for 21 days and then only asa -- PT OT and speech to evaluate--HHPT/OT --CT angio head/neck--right 60% stenosis Right ICA-- out pt vascular f/u with Dr schnier/dew   Multifocal pneumonia Cavitary lesion neoplasm vs infection and multifocal PNA on CT chest chronic respiratory failure secondary to severe emphysema on oxygen 3.5 liters chronically at home --  IV antibiotics, inhalers, oxygen, bronchodilator --IV unasyn, zithromax--change to po abxs --pt will f/u Dr Lanney Gins, pulmonary as out--d/w him --pt desats with exertion--chronic   Hypertension -- continue blood pressure meds   Type II diabetes, hyperglycemia -- sliding scale insulin for now -- will resume metformin   Acute urinary retention/?BPH -- patient had in and out catheter and foley placed in ER --d/c foley --pt able to urinate   CAD status post stent -- aspirin, statin, Plavix -- amlodipine, lisinopril, metoprolol  Spoke and updated pt's [facility] NP Mary butler on the phone  Overall best at baseline. Discharge home   Family communication : daughter Neoma Laming left VM Consults : neurology CODE STATUS: full DVT Prophylaxis : Lovenox Level of care: Telemetry Medical Status is: Inpatient     Pain control - [place] Controlled Substance Reporting System database was reviewed. and patient was instructed, not to drive, operate heavy machinery, perform activities at heights, swimming or participation in water activities or provide baby-sitting services while on Pain, Sleep and Anxiety Medications; until their outpatient Physician has advised to do so again. Also recommended to not to take more than prescribed Pain, Sleep and Anxiety Medications.  Consultants: Neuro, pulm  Disposition: Home health Diet recommendation:  Discharge Diet Orders (From  admission, onward)     Start     Ordered   03/03/22 0000  Diet - low sodium heart healthy        03/03/22 1210           Cardiac  and Carb modified diet DISCHARGE MEDICATION: Allergies as of 03/03/2022   No Known Allergies      Medication List     TAKE these medications    albuterol 108 (90 Base) MCG/ACT inhaler Commonly known as: VENTOLIN HFA Inhale 2 puffs into the lungs every 6 (six) hours as needed for wheezing or shortness of breath. What changed: Another medication with the same name was removed. Continue taking this medication, and follow the directions you see here.   amLODipine 10 MG tablet Commonly known as: NORVASC TAKE ONE-HALF TABLET BY MOUTH EVERY DAY FOR BLOOD PRESSURE   amoxicillin-clavulanate 875-125 MG tablet Commonly known as: AUGMENTIN Take 1 tablet by mouth every 12 (twelve) hours for 6 days.   aspirin EC 81 MG tablet Take 1 tablet (81 mg total) by mouth daily.   atorvastatin 40 MG tablet Commonly known as: LIPITOR TAKE 1 TABLET BY MOUTH EVERY DAY   azithromycin 500 MG tablet Commonly known as: ZITHROMAX Take 1 tablet (500 mg total) by mouth daily for 3 days. Start taking on: March 04, 2022   Breztri Aerosphere 160-9-4.8 MCG/ACT Aero Generic drug: Budeson-Glycopyrrol-Formoterol Inhale 2 puffs into the lungs in the morning and at bedtime.   Cholecalciferol 50 MCG (2000 UT) Tabs Take 1 tablet by mouth daily.   clopidogrel 75 MG tablet Commonly known as: PLAVIX Take 1 tablet (75 mg total) by mouth daily for 21 days. Start taking on: March 04, 2022   furosemide 20 MG tablet Commonly known as: LASIX Take 1 tablet (20 mg total) by mouth daily as needed.   ipratropium 0.06 % nasal spray Commonly known as: ATROVENT Place 2 sprays into both nostrils 4 (four) times daily.   ipratropium-albuterol 0.5-2.5 (3) MG/3ML Soln Commonly known as: DUONEB USE 1 NEBULIZER SOLUTION (3ML) BY ORAL INHALATION EVERY 6 HOURS AS NEEDED FOR SHORTNESS OF BREATH   ketoconazole 2 % cream Commonly known as: NIZORAL APPLY SUFFICIENT AMOUNT TOPICALLY TWO TIMES A DAY TO RASH ON FOOT. USE FOR  2 WEEKS, THEN DISCONTINUE   lisinopril 40 MG tablet Commonly known as: ZESTRIL TAKE ONE-HALF TABLET BY MOUTH EVERY DAY FOR BLOOD PRESSURE   metFORMIN 500 MG tablet Commonly known as: GLUCOPHAGE Take 1,000 mg by mouth 2 (two) times daily with a meal.   metoprolol tartrate 50 MG tablet Commonly known as: LOPRESSOR TAKE 1 TABLET BY MOUTH TWICE A DAY   nitroGLYCERIN 0.4 MG SL tablet Commonly known as: NITROSTAT Place 1 tablet (0.4 mg total) under the tongue every 5 (five) minutes as needed for chest pain.   predniSONE 5 MG tablet Commonly known as: DELTASONE Take 1 tablet by mouth daily.   senna-docusate 8.6-50 MG tablet Commonly known as: Senokot-S Take 1 tablet by mouth 2 (two) times daily.   tamsulosin 0.4 MG Caps capsule Commonly known as: FLOMAX TAKE ONE CAPSULE BY MOUTH EVERY EVENING TO REPLACE TERAZOSIN        Follow-up Information     Olen Pel, NP. Schedule an appointment as soon as possible for a visit in 1 week(s).   Specialty: Nurse Practitioner Contact information: [place] [address] [phone]         Ottie Glazier, MD. Schedule an appointment as soon as possible  for a visit in 1 week(s).   Specialty: Pulmonary Disease Why: left UL cavitary lung lesion, advanced COPD Contact information: [place] [address] [phone]                 Filed Weights   02/28/22 1058  Weight: 52.8 kg     Condition at discharge: fair  The results of significant diagnostics from this hospitalization (including imaging, microbiology, ancillary and laboratory) are listed below for reference.   Imaging Studies: CT ANGIO HEAD NECK W WO CM  Result Date: 03/02/2022 CLINICAL DATA:  Stroke/TIA EXAM: CT ANGIOGRAPHY HEAD AND NECK TECHNIQUE: Multidetector CT imaging of the head and neck was performed using the standard protocol during bolus administration of intravenous contrast. Multiplanar CT image  reconstructions and MIPs were obtained to evaluate the vascular anatomy. Carotid stenosis measurements (when applicable) are obtained utilizing NASCET criteria, using the distal internal carotid diameter as the denominator. RADIATION DOSE REDUCTION: This exam was performed according to the departmental dose-optimization program which includes automated exposure control, adjustment of the mA and/or kV according to patient size and/or use of iterative reconstruction technique. CONTRAST:  18m OMNIPAQUE IOHEXOL 350 MG/ML SOLN COMPARISON:  02/28/2022 FINDINGS: CT HEAD FINDINGS Brain: There is no mass, hemorrhage or extra-axial collection. There is generalized atrophy without lobar predilection. There is hypoattenuation of the periventricular white matter, most commonly indicating chronic ischemic microangiopathy. Skull: The visualized skull base, calvarium and extracranial soft tissues are normal. Sinuses/Orbits: No fluid levels or advanced mucosal thickening of the visualized paranasal sinuses. No mastoid or middle ear effusion. The orbits are normal. CTA NECK FINDINGS SKELETON: There is no bony spinal canal stenosis. No lytic or blastic lesion. OTHER NECK: Normal pharynx, larynx and major salivary glands. No cervical lymphadenopathy. Unremarkable thyroid gland. UPPER CHEST: Emphysema. Multifocal cavitation in the left upper lobe. AORTIC ARCH: There is calcific atherosclerosis of the aortic arch. There is no aneurysm, dissection or hemodynamically significant stenosis of the visualized portion of the aorta. Conventional 3 vessel aortic branching pattern. The visualized proximal subclavian arteries are widely patent. RIGHT CAROTID SYSTEM: No dissection, occlusion or aneurysm. There is predominantly calcified atherosclerosis extending into the proximal ICA, resulting in 60% stenosis. Mild calcific atherosclerosis in the common carotid artery without stenosis. LEFT CAROTID SYSTEM: No dissection, occlusion or aneurysm.  Mild atherosclerotic calcification at the carotid bifurcation without hemodynamically significant stenosis. Mild atherosclerosis within the left common carotid artery. VERTEBRAL ARTERIES: Right dominant configuration. Both origins are clearly patent. There is no dissection, occlusion or flow-limiting stenosis to the skull base (V1-V3 segments). CTA HEAD FINDINGS POSTERIOR CIRCULATION: --Vertebral arteries: Normal V4 segments. --Inferior cerebellar arteries: Normal. --Basilar artery: Normal. --Superior cerebellar arteries: Normal. --Posterior cerebral arteries (PCA): Normal. ANTERIOR CIRCULATION: --Intracranial internal carotid arteries: Atherosclerotic calcification of the internal carotid arteries at the skull base without hemodynamically significant stenosis. --Anterior cerebral arteries (ACA): Normal. Both A1 segments are present. Patent anterior communicating artery (a-comm). --Middle cerebral arteries (MCA): Normal. VENOUS SINUSES: As permitted by contrast timing, patent. ANATOMIC VARIANTS: None Review of the MIP images confirms the above findings. IMPRESSION: 1. No emergent large vessel occlusion or high-grade stenosis of the intracranial arteries. 2. Approximately 60% stenosis of the proximal right ICA secondary to predominantly calcified atherosclerosis. 3. Abnormalities within the chest are better characterized on the chest CT. Aortic atherosclerosis (ICD10-I70.0). Electronically Signed   By: KUlyses JarredM.D.   On: 03/02/2022 01:57   CT CHEST ABDOMEN PELVIS W CONTRAST  Result Date: 03/02/2022 CLINICAL DATA:  Sepsis. EXAM: CT CHEST, ABDOMEN, AND PELVIS WITH CONTRAST TECHNIQUE: Multidetector CT imaging of the chest, abdomen and pelvis was performed following the standard protocol during bolus administration of intravenous contrast. RADIATION DOSE REDUCTION: This exam was performed according to the departmental dose-optimization program which includes automated exposure control, adjustment of the mA  and/or kV according to patient size and/or use of iterative reconstruction technique. CONTRAST:  128m OMNIPAQUE IOHEXOL 350 MG/ML SOLN COMPARISON:  CT chest 04/03/2021 FINDINGS: CT CHEST FINDINGS Cardiovascular: No significant vascular findings. Normal heart size. No pericardial effusion. There are atherosclerotic calcifications of the aorta and coronary arteries. Mediastinum/Nodes: No enlarged mediastinal, hilar, or axillary lymph nodes. Thyroid gland, trachea, and esophagus demonstrate no significant findings. Lungs/Pleura: Severe emphysematous changes are again seen. Are new patchy areas of airspace disease peripherally in the bilateral upper lobes, right middle lobe and right lower lobe. There is a new thick-walled cavitary lesion in the left upper lobe abutting the pleura measuring 4.9 x 3.2 x 5.4 cm. There is some surrounding patchy airspace disease. This appears contiguous with oblong airspace opacity in the left upper lobe abutting the pleura image 8/50 measuring 1.9 x 3.4 by 1.5 cm. There is a 3 mm nodule in the left lower lobe image 8/79 which is new from prior. There are trace bilateral pleural effusions. No pneumothorax. Musculoskeletal: No chest wall mass or suspicious bone lesions identified. CT ABDOMEN PELVIS FINDINGS Hepatobiliary: No focal liver abnormality is seen. No gallstones, gallbladder wall thickening, or biliary dilatation. Pancreas: Unremarkable. No pancreatic ductal dilatation or surrounding inflammatory changes. Spleen: Normal in size without focal abnormality. Adrenals/Urinary Tract: There are numerous bilateral renal cysts. The largest is in the left kidney measuring 4 cm. There is a mildly hyperdense likely cysts in the right kidney measuring 16 mm. There is no hydronephrosis or perinephric fat stranding. There is a 3 mm calculus in the right kidney. The adrenal glands are within normal limits. The bladder is decompressed by Foley catheter. Stomach/Bowel: There are postsurgical  changes in the colon, likely hemicolectomy. There is a large amount of stool throughout the colon. There is no bowel obstruction, pneumatosis or free air. There is sigmoid colon diverticulosis. The stomach is decompressed. Vascular/Lymphatic: Aortic atherosclerosis. No enlarged abdominal or pelvic lymph nodes. Reproductive: Prostate is unremarkable. Other: No abdominal wall hernia or abnormality. No abdominopelvic ascites. Musculoskeletal: No acute or significant osseous findings. IMPRESSION: 1. New thick-walled cavitary lesion in the left upper lobe measuring up to 5.4 cm. There is some surrounding patchy airspace disease. Findings are worrisome for cavitary neoplasm. Infection is also in the differential. 2. New patchy areas of airspace disease peripherally in the bilateral upper lobes, right middle lobe and right lower lobe worrisome for multifocal pneumonia. 3. Trace bilateral pleural effusions. 4. Severe emphysema. 5. Nonobstructing right renal calculus. 6. Colonic diverticulosis. 7. Bilateral renal cysts. One of the cysts in the right kidney is mildly hyperdense and may represent hemorrhagic or proteinaceous cyst. This can be further evaluated with ultrasound. 8. Large amount of stool throughout the colon. 9. There is a 3 mm nodule in the left lower lobe which is new from prior, indeterminate given other findings. Aortic Atherosclerosis (ICD10-I70.0) and Emphysema (ICD10-J43.9). Electronically Signed   By: ARonney AstersM.D.   On: 03/02/2022 01:54   MR BRAIN WO CONTRAST  Result Date: 02/28/2022 CLINICAL DATA:  Altered mental status EXAM: MRI HEAD WITHOUT CONTRAST TECHNIQUE: Multiplanar, multiecho pulse sequences of the brain and surrounding structures were obtained without intravenous contrast. COMPARISON:  None Available. FINDINGS: Brain: There are a few small foci of abnormal diffusion restriction in the left PCA territory. Fewer than 5 scattered microhemorrhages in a nonspecific pattern. There is  multifocal hyperintense T2-weighted signal within the white matter. Generalized volume loss. 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 The midline structures are normal. Vascular: Major flow voids are preserved. Skull and upper cervical spine: Normal calvarium and skull base. Visualized upper cervical spine and soft tissues are normal. Sinuses/Orbits:No paranasal sinus fluid levels or advanced mucosal thickening. No mastoid or middle ear effusion. Normal orbits. IMPRESSION: 1. A few small foci of acute ischemia in the left PCA territory. No hemorrhage or mass effect. 2. 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 Electronically Signed   By: Ulyses Jarred M.D.   On: 02/28/2022 20:03   CT HEAD WO CONTRAST (5MM)  Result Date: 02/28/2022 CLINICAL DATA:  Mental status change. EXAM: CT HEAD WITHOUT CONTRAST TECHNIQUE: Contiguous axial images were obtained from the base of the skull through the vertex without intravenous contrast. RADIATION DOSE REDUCTION: This exam was performed according to the departmental dose-optimization program which includes automated exposure control, adjustment of the mA and/or kV according to patient size and/or use of iterative reconstruction technique. COMPARISON:  Head CT 03/30/2016 is FINDINGS: Brain: No acute intracranial hemorrhage. No focal mass lesion. No CT evidence of acute infarction. No midline shift or mass effect. No hydrocephalus. Basilar cisterns are patent. There are periventricular and subcortical white matter hypodensities. Generalized cortical atrophy. Vascular: No hyperdense vessel or unexpected calcification. Skull: Normal. Negative for fracture or focal lesion. Sinuses/Orbits: Paranasal sinuses and mastoid air cells are clear. Orbits are clear. Other: None. IMPRESSION: No acute intracranial findings. Electronically Signed   By: Suzy Bouchard M.D.   On: 02/28/2022 11:32   DG Chest Portable 1 View  Result Date: 02/28/2022 CLINICAL  DATA:  Altered mental status. EXAM: PORTABLE CHEST 1 VIEW COMPARISON:  Chest x-ray dated February 22, 2021. FINDINGS: The heart size and mediastinal contours are within normal limits. Normal pulmonary vascularity. New mild asymmetric nodular opacities in the right lower lobe, peripheral right mid lung, and peripheral left upper lobe. No pleural effusion or pneumothorax. No acute osseous abnormality. IMPRESSION: 1. New mild asymmetric nodular opacities in the both lungs, concerning for multifocal pneumonia. Electronically Signed   By: Titus Dubin M.D.   On: 02/28/2022 11:28    Microbiology: Results for orders placed or performed during the hospital encounter of 02/28/22  Blood culture (routine x 2)     Status: None (Preliminary result)   Collection Time: 02/28/22 11:07 AM   Specimen: BLOOD  Result Value Ref Range Status   Specimen Description BLOOD BLOOD LEFT ARM  Final   Special Requests   Final    BOTTLES DRAWN AEROBIC AND ANAEROBIC Blood Culture adequate volume   Culture   Final    NO GROWTH 3 DAYS Performed at [facility], [address] [place], [place] [address]    Report Status PENDING  Incomplete  Blood culture (routine x 2)     Status: None (Preliminary result)   Collection Time: 02/28/22 11:07 AM   Specimen: BLOOD  Result Value Ref Range Status   Specimen Description BLOOD BLOOD RIGHT ARM  Final   Special Requests   Final    BOTTLES DRAWN AEROBIC AND ANAEROBIC Blood Culture results may not be optimal due to an excessive volume of blood received in culture bottles   Culture   Final  NO GROWTH 3 DAYS Performed at [facility], [address] [place], [place] [address]    Report Status PENDING  Incomplete  Resp panel by RT-PCR (RSV, Flu A&B, Covid) Anterior Nasal Swab     Status: None   Collection Time: 02/28/22 11:08 AM   Specimen: Anterior Nasal Swab  Result Value Ref Range Status   SARS Coronavirus 2 by RT PCR NEGATIVE NEGATIVE Final   Influenza A  by PCR NEGATIVE NEGATIVE Final   Influenza B by PCR NEGATIVE NEGATIVE Final   Resp Syncytial Virus by PCR NEGATIVE NEGATIVE Final    Comment: Performed at [facility], [address], [place], [place] [address]  Urine Culture     Status: None   Collection Time: 02/28/22  1:05 PM   Specimen: Urine, Clean Catch  Result Value Ref Range Status   Specimen Description   Final    URINE, CLEAN CATCH Performed at [facility], [address], [place], [place] [address]    Special Requests   Final    NONE Performed at [facility], [address], [place], [place] [address]    Culture   Final    NO GROWTH Performed at [facility], [place] [address] [place], [place] [address]    Report Status 03/01/2022 FINAL  Final    Labs: CBC: Recent Labs  Lab 02/28/22 1107 03/01/22 0639  WBC 9.2 11.7*  NEUTROABS 7.6  --   HGB 11.7* 11.9*  HCT 36.2* 37.0*  MCV 95.0 94.6  PLT 205 010   Basic Metabolic Panel: Recent Labs  Lab 02/28/22 1107 02/28/22 1305 03/01/22 0645  NA 140  --  140  K 4.1  --  3.6  CL 95*  --  100  CO2 35*  --  32  GLUCOSE 230*  --  173*  BUN 27*  --  14  CREATININE 1.06  --  0.86  CALCIUM 9.2  --  8.5*  MG  --  1.6* 1.9  PHOS  --  2.9  --    Liver Function Tests: Recent Labs  Lab 02/28/22 1107  AST 27  ALT 22  ALKPHOS 93  BILITOT 0.6  PROT 7.3  ALBUMIN 3.7   CBG: Recent Labs  Lab 03/02/22 1126 03/02/22 1641 03/02/22 2102 03/03/22 0729 03/03/22 1141  GLUCAP 306* 264* 273* 260* 165*    Discharge time spent: greater than 30 minutes.  Signed: Fritzi Mandes, MD [facility] 03/03/2022

## 2022-03-03 NOTE — Progress Notes (Signed)
Physical Therapy Treatment Patient Details Name: Stanley Mclean MRN: [id number] DOB: [birth year] Today's Date: 03/03/2022   History of Present Illness Stanley Mclean is a 19yoM admitted on 02/28/22 after presenting with chief concerns of AMS. Pt is being treated for AMS/acute metabolic encephalopathy in the setting of acute small L PCA territory stroke & PNA. PMH: HTN, COPD, chronic 4L O2 use, BPH, HLD, NIDDM    PT Comments    Pt seated EOB on entry, pt handed off from RN who is responding to bed alarm- pt has been restless and still disoriented, not attending to cues for safety rules on unit. Author allows pt to don his prosthesis without issue, then he rises to standing several time in session, always without warning. Pt repeatedly declines offers to partake in mobility session with author, but impulsively commences walking away from bed without any clear plan. Author makes attempts to provide a safe environment for patient-led mobility tasks but ultimately his confusion, aggravation, and general state of opposition make it difficult to find resolution of task and goals. Generally he desires for Stanley Mclean to leave and is adamantly opposed to relocating to a sittings space that has a safety alarm. Author ultimately calls in RN to assist with safety monitoring at end of session. Pt is not oriented at this time and generally opposed to participating in any PT. Will signoff at this time. Pt is in process of DC back to home, waiting for family to arrive.     Recommendations for follow up therapy are one component of a multi-disciplinary discharge planning process, led by the attending physician.  Recommendations may be updated based on patient status, additional functional criteria and insurance authorization.  Follow Up Recommendations  Home health PT     Assistance Recommended at Discharge Frequent or constant Supervision/Assistance  Patient can return home with the following A little help with  walking and/or transfers;A little help with bathing/dressing/bathroom;Assistance with cooking/housework;Assist for transportation;Help with stairs or ramp for entrance   Equipment Recommendations  None recommended by PT    Recommendations for Other Services       Precautions / Restrictions Precautions Precautions: Fall Precaution Comments: old R BKA Restrictions Weight Bearing Restrictions: No     Mobility  Bed Mobility               General bed mobility comments: seated EOB on entry    Transfers   Equipment used: None Transfers: Sit to/from Stand, Bed to chair/wheelchair/BSC Sit to Stand: Min guard, Supervision   Step pivot transfers: Supervision, Min guard       General transfer comment: remains confused, agitated, poor safety awareness    Ambulation/Gait                   Stairs             Wheelchair Mobility    Modified Rankin (Stroke Patients Only)       Balance                                            Cognition   Behavior During Therapy: Agitated, Impulsive, Restless  Exercises Other Exercises Other Exercises: STS from EOB x2, SPT from EOB to guest chair at window x1 Other Exercises: dons his prosthetic leg without issue, no cues needed    General Comments        Pertinent Vitals/Pain      Home Living                          Prior Function            PT Goals (current goals can now be found in the care plan section) Acute Rehab PT Goals Patient Stated Goal: find prosthetic leg PT Goal Formulation: All assessment and education complete, DC therapy Time For Goal Achievement: 03/16/22 Potential to Achieve Goals: Good Progress towards PT goals: Goals met/education completed, patient discharged from PT    Frequency    Min 2X/week      PT Plan Frequency needs to be updated    Co-evaluation               AM-PAC PT "6 Clicks" Mobility   Outcome Measure  Help needed turning from your back to your side while in a flat bed without using bedrails?: None Help needed moving from lying on your back to sitting on the side of a flat bed without using bedrails?: None Help needed moving to and from a bed to a chair (including a wheelchair)?: A Little Help needed standing up from a chair using your arms (e.g., wheelchair or bedside chair)?: A Little Help needed to walk in hospital room?: A Little Help needed climbing 3-5 steps with a railing? : A Lot 6 Click Score: 19    End of Session Equipment Utilized During Treatment: Oxygen Activity Tolerance: Treatment limited secondary to agitation Patient left: in chair;with nursing/sitter in room (Pt refusing to transfer to a location that has a safety alarm, becoming increasing agitated when asked.) Nurse Communication: Mobility status PT Visit Diagnosis: Muscle weakness (generalized) (M62.81);Unsteadiness on feet (R26.81);Difficulty in walking, not elsewhere classified (R26.2)     Time: 1340-1350 PT Time Calculation (min) (ACUTE ONLY): 10 min  Charges:  $Therapeutic Activity: 8-22 mins                    2:38 PM, 03/03/22 Stanley Mclean, PT, DPT Physical Therapist - [facility]  [phone] ([place])    Stanley Mclean 03/03/2022, 2:30 PM

## 2022-03-03 NOTE — Discharge Instructions (Signed)
Take Plavix for 21 days along with aspirin and thereafter continue aspirin daily Use your  oxygen, inhaler, nebulizer as before. F/u With pulmonary [facility] on your appointment that will be scheduled

## 2022-03-03 NOTE — TOC Progression Note (Addendum)
Transition of Care [facility]) - Progression Note    Patient Details  Name: Stanley Mclean MRN: [id number] Date of Birth: Aug 08, 1940  Transition of Care [facility]) CM/SW [place], RN Phone Number: 03/03/2022, 11:18 AM  Clinical Narrative:    Met with the patient, he is confused I attempted to reach the daughter Marta Lamas, at [phone] and left a general Vm asking for a return call, attempted to reach the wife [name] at [phone] unable to reach, the patient stated that he has oxygen at home, need to confirm with daughter Reached out to meg at [employer] to set up [facility] services  Expected Discharge Plan: [place] Barriers to Discharge: Barriers Resolved  Expected Discharge Plan and Services Expected Discharge Plan: [place]   Discharge Planning Services: CM Consult   Living arrangements for the past 2 months: Single Family Home                                       Social Determinants of Health (SDOH) Interventions    Readmission Risk Interventions     No data to display

## 2022-03-03 NOTE — Consult Note (Signed)
PULMONOLOGY         Date: 03/03/2022,   MRN# [id number] Stanley Mclean 04-25-1940     AdmissionWeight: 52.8 kg                 CurrentWeight: 52.8 kg  Referring provider: Dr Posey Pronto   CHIEF COMPLAINT:   Cavitary mass of Right upper lobe   HISTORY OF PRESENT ILLNESS    [name] is a 81 year old male with history of hypertension, COPD, on chronic 4 L nasal cannula, BPH, hyperlipidemia, non-insulin-dependent diabetes mellitus, who presents emergency department for chief concerns of altered mental status.   Per spouse at bedside, patient was at his baseline self last night.  She noticed the altered mental status at 5 AM on day of admission.   Initial vitals in the ED showed temperature of 98.4, respiration rate of 15, heart rate of 85, blood pressure 181/82, SpO2 of 98% on his baseline 4 L nasal cannula.   Serum sodium is 140, potassium 4.1, chloride 95, bicarb 35, BUN of 27, serum creatinine of 1.06, nonfasting blood glucose 230, EGFR greater than 60, WBC 9.2, hemoglobin 11.7, platelets of 205.   High sensitive troponin was 25.  COVID/influenza A/influenza B/RSV PCR were negative.   CT of the head without contrast was read as no acute intracranial findings.   Portable chest x-ray 1 view: New mild asymmetric nodular opacities in both lungs concerning for multifocal pneumonia.  I reviewed CT chest with patient . We discussed outpatient pulmonary follow up post DC.   PAST MEDICAL HISTORY   Past Medical History:  Diagnosis Date   Anemia    Basal cell carcinoma    BPH (benign prostatic hyperplasia)    CAD (coronary artery disease)    a. s/p multiple prior PCIs b. s/p DES-distal RCA 05/2013   COPD (chronic obstructive pulmonary disease) (HCC)    GERD (gastroesophageal reflux disease)    Macular degeneration    Pure hypercholesterolemia    Stroke ([place])    Tobacco abuse    Traumatic amputation of leg(s) (complete) (partial), unilateral, level not specified,  without mention of complication 40/98/1191   Right Below Knee Amputation   Unspecified essential hypertension      SURGICAL HISTORY   Past Surgical History:  Procedure Laterality Date   AMPUTATION Right 08/26/2011   Below Knee Amputee   APPENDECTOMY     CARDIAC CATHETERIZATION     CAROTID ANGIOGRAPHY Right 05/14/2016   Procedure: Carotid Angiography;  Surgeon: Katha Cabal, MD;  Location: [place] [facility];  Service: Cardiovascular;  Laterality: Right;   CAROTID PTA/STENT INTERVENTION  05/14/2016   Procedure: Carotid PTA/Stent Intervention;  Surgeon: Katha Cabal, MD;  Location: [place] [facility];  Service: Cardiovascular;;   CORONARY ANGIOPLASTY WITH STENT PLACEMENT  09/28/2006   RCA   CORONARY ANGIOPLASTY WITH STENT PLACEMENT  07/21/2006   Mid LAD   CORONARY ANGIOPLASTY WITH STENT PLACEMENT  06/06/2013   30% prox LAD at the site of a prior stent, 40% mid LAD at the origin of D1, 40% ostial D1, 30% prox LCx at the site of a prior stent, in the proximal third of the vessel segment, 20% prox RCA, 99% distal RCA s/p DES. EF >55%.     FAMILY HISTORY   Family History  Problem Relation Age of Onset   Mental illness Mother      SOCIAL HISTORY   Social History   Tobacco Use   Smoking status: Every  Day    Packs/day: 1.00    Years: 60.00    Total pack years: 60.00    Types: Cigarettes    Last attempt to quit: 03/29/2016    Years since quitting: 5.9   Smokeless tobacco: Never   Tobacco comments:    1 pack will last 1 week--03/05/21  Vaping Use   Vaping Use: Never used  Substance Use Topics   Alcohol use: No   Drug use: No     MEDICATIONS    Home Medication:  Current Outpatient Rx   Order #: [id number] Class: Print   Order #: [id number] Class: Normal   [START ON 03/04/2022] Order #: [id number] Class: Normal   [START ON 03/04/2022] Order #: [id number] Class: Normal   Order #: [id number] Class: Normal    Current Medication:  Current Facility-Administered  Medications:    acetaminophen (TYLENOL) tablet 650 mg, 650 mg, Oral, Q6H PRN **OR** acetaminophen (TYLENOL) suppository 650 mg, 650 mg, Rectal, Q6H PRN, Cox, Amy N, DO   amLODipine (NORVASC) tablet 10 mg, 10 mg, Oral, Daily, Cox, Amy N, DO, 10 mg at 03/03/22 0955   amoxicillin-clavulanate (AUGMENTIN) 875-125 MG per tablet 1 tablet, 1 tablet, Oral, Q12H, Fritzi Mandes, MD, 1 tablet at 03/03/22 1023   aspirin EC tablet 81 mg, 81 mg, Oral, Daily, Cox, Amy N, DO, 81 mg at 03/03/22 0956   atorvastatin (LIPITOR) tablet 40 mg, 40 mg, Oral, QHS, Cox, Amy N, DO, 40 mg at 03/02/22 2121   azithromycin (ZITHROMAX) tablet 500 mg, 500 mg, Oral, Daily, Fritzi Mandes, MD, 500 mg at 03/03/22 0955   cholecalciferol (VITAMIN D3) 25 MCG (1000 UNIT) tablet 2,000 Units, 2,000 Units, Oral, Daily, Cox, Amy N, DO, 2,000 Units at 03/03/22 0956   [COMPLETED] clopidogrel (PLAVIX) tablet 300 mg, 300 mg, Oral, Once, 300 mg at 03/02/22 2121 **FOLLOWED BY** clopidogrel (PLAVIX) tablet 75 mg, 75 mg, Oral, Daily, Bhagat, Srishti L, MD, 75 mg at 03/03/22 0955   enoxaparin (LOVENOX) injection 40 mg, 40 mg, Subcutaneous, Q24H, Cox, Amy N, DO, 40 mg at 03/02/22 2121   hydrALAZINE (APRESOLINE) injection 5 mg, 5 mg, Intravenous, Q8H PRN, Cox, Amy N, DO   insulin aspart (novoLOG) injection 0-5 Units, 0-5 Units, Subcutaneous, QHS, Cox, Amy N, DO, 3 Units at 03/02/22 2129   insulin aspart (novoLOG) injection 0-9 Units, 0-9 Units, Subcutaneous, TID WC, Cox, Amy N, DO, 5 Units at 03/03/22 0752   ipratropium-albuterol (DUONEB) 0.5-2.5 (3) MG/3ML nebulizer solution 3 mL, 3 mL, Nebulization, Q6H PRN, Cox, Amy N, DO, 3 mL at 03/01/22 1544   lisinopril (ZESTRIL) tablet 20 mg, 20 mg, Oral, Daily, Cox, Amy N, DO, 20 mg at 03/03/22 0955   melatonin tablet 5 mg, 5 mg, Oral, QHS PRN, Cox, Amy N, DO   [START ON 03/04/2022] metFORMIN (GLUCOPHAGE) tablet 1,000 mg, 1,000 mg, Oral, BID WC, Fritzi Mandes, MD   metoprolol tartrate (LOPRESSOR) tablet 50 mg, 50 mg,  Oral, BID, Cox, Amy N, DO, 50 mg at 03/03/22 0956   mometasone-formoterol (DULERA) 200-5 MCG/ACT inhaler 2 puff, 2 puff, Inhalation, BID, Vira Blanco, RPH, 2 puff at 03/03/22 0752   nitroGLYCERIN (NITROSTAT) SL tablet 0.4 mg, 0.4 mg, Sublingual, Q5 min PRN, Cox, Amy N, DO   ondansetron (ZOFRAN) tablet 4 mg, 4 mg, Oral, Q6H PRN **OR** ondansetron (ZOFRAN) injection 4 mg, 4 mg, Intravenous, Q6H PRN, Cox, Amy N, DO   predniSONE (DELTASONE) tablet 5 mg, 5 mg, Oral, Daily, Fritzi Mandes, MD, 5 mg at 03/03/22 0955   senna-docusate (Senokot-S) tablet  1 tablet, 1 tablet, Oral, QHS PRN, Cox, Amy N, DO   tamsulosin (FLOMAX) capsule 0.4 mg, 0.4 mg, Oral, QPM, Fritzi Mandes, MD, 0.4 mg at 03/03/22 9147    ALLERGIES   Patient has no known allergies.     REVIEW OF SYSTEMS    Review of Systems:  Gen:  Denies  fever, sweats, chills weigh loss  HEENT: Denies blurred vision, double vision, ear pain, eye pain, hearing loss, nose bleeds, sore throat Cardiac:  No dizziness, chest pain or heaviness, chest tightness,edema Resp:   reports dyspnea chronically  Gi: Denies swallowing difficulty, stomach pain, nausea or vomiting, diarrhea, constipation, bowel incontinence Gu:  Denies bladder incontinence, burning urine Ext:   Denies Joint pain, stiffness or swelling Skin: Denies  skin rash, easy bruising or bleeding or hives Endoc:  Denies polyuria, polydipsia , polyphagia or weight change Psych:   Denies depression, insomnia or hallucinations   Other:  All other systems negative   VS: BP (!) 138/52 (BP Location: Left Arm)   Pulse 95   Temp 98.1 F (36.7 C)   Resp 16   Wt 52.8 kg   SpO2 95%   BMI 17.19 kg/m      PHYSICAL EXAM    GENERAL:NAD, no fevers, chills, no weakness no fatigue HEAD: Normocephalic, atraumatic.  EYES: Pupils equal, round, reactive to light. Extraocular muscles intact. No scleral icterus.  MOUTH: Moist mucosal membrane. Dentition intact. No abscess noted.  EAR, NOSE,  THROAT: Clear without exudates. No external lesions.  NECK: Supple. No thyromegaly. No nodules. No JVD.  PULMONARY: decreased breath sounds with mild rhonchi worse at bases bilaterally.  CARDIOVASCULAR: S1 and S2. Regular rate and rhythm. No murmurs, rubs, or gallops. No edema. Pedal pulses 2+ bilaterally.  GASTROINTESTINAL: Soft, nontender, nondistended. No masses. Positive bowel sounds. No hepatosplenomegaly.  MUSCULOSKELETAL: No swelling, clubbing, or edema. Range of motion full in all extremities.  NEUROLOGIC: Cranial nerves II through XII are intact. No gross focal neurological deficits. Sensation intact. Reflexes intact.  SKIN: No ulceration, lesions, rashes, or cyanosis. Skin warm and dry. Turgor intact.  PSYCHIATRIC: Mood, affect within normal limits. The patient is awake, alert and oriented x 3. Insight, judgment intact.       IMAGING     arrative & Impression  CLINICAL DATA:  Sepsis.   EXAM: CT CHEST, ABDOMEN, AND PELVIS WITH CONTRAST   TECHNIQUE: Multidetector CT imaging of the chest, abdomen and pelvis was performed following the standard protocol during bolus administration of intravenous contrast.   RADIATION DOSE REDUCTION: This exam was performed according to the departmental dose-optimization program which includes automated exposure control, adjustment of the mA and/or kV according to patient size and/or use of iterative reconstruction technique.   CONTRAST:  166m OMNIPAQUE IOHEXOL 350 MG/ML SOLN   COMPARISON:  CT chest 04/03/2021   FINDINGS: CT CHEST FINDINGS   Cardiovascular: No significant vascular findings. Normal heart size. No pericardial effusion. There are atherosclerotic calcifications of the aorta and coronary arteries.   Mediastinum/Nodes: No enlarged mediastinal, hilar, or axillary lymph nodes. Thyroid gland, trachea, and esophagus demonstrate no significant findings.   Lungs/Pleura: Severe emphysematous changes are again seen. Are  new patchy areas of airspace disease peripherally in the bilateral upper lobes, right middle lobe and right lower lobe. There is a new thick-walled cavitary lesion in the left upper lobe abutting the pleura measuring 4.9 x 3.2 x 5.4 cm. There is some surrounding patchy airspace disease. This appears contiguous with oblong airspace opacity in the  left upper lobe abutting the pleura image 8/50 measuring 1.9 x 3.4 by 1.5 cm. There is a 3 mm nodule in the left lower lobe image 8/79 which is new from prior. There are trace bilateral pleural effusions. No pneumothorax.   Musculoskeletal: No chest wall mass or suspicious bone lesions identified.   CT ABDOMEN PELVIS FINDINGS   Hepatobiliary: No focal liver abnormality is seen. No gallstones, gallbladder wall thickening, or biliary dilatation.   Pancreas: Unremarkable. No pancreatic ductal dilatation or surrounding inflammatory changes.   Spleen: Normal in size without focal abnormality.   Adrenals/Urinary Tract: There are numerous bilateral renal cysts. The largest is in the left kidney measuring 4 cm. There is a mildly hyperdense likely cysts in the right kidney measuring 16 mm. There is no hydronephrosis or perinephric fat stranding. There is a 3 mm calculus in the right kidney. The adrenal glands are within normal limits. The bladder is decompressed by Foley catheter.   Stomach/Bowel: There are postsurgical changes in the colon, likely hemicolectomy. There is a large amount of stool throughout the colon. There is no bowel obstruction, pneumatosis or free air. There is sigmoid colon diverticulosis. The stomach is decompressed.   Vascular/Lymphatic: Aortic atherosclerosis. No enlarged abdominal or pelvic lymph nodes.   Reproductive: Prostate is unremarkable.   Other: No abdominal wall hernia or abnormality. No abdominopelvic ascites.   Musculoskeletal: No acute or significant osseous findings.   IMPRESSION: 1. New thick-walled  cavitary lesion in the left upper lobe measuring up to 5.4 cm. There is some surrounding patchy airspace disease. Findings are worrisome for cavitary neoplasm. Infection is also in the differential. 2. New patchy areas of airspace disease peripherally in the bilateral upper lobes, right middle lobe and right lower lobe worrisome for multifocal pneumonia. 3. Trace bilateral pleural effusions. 4. Severe emphysema. 5. Nonobstructing right renal calculus. 6. Colonic diverticulosis. 7. Bilateral renal cysts. One of the cysts in the right kidney is mildly hyperdense and may represent hemorrhagic or proteinaceous cyst. This can be further evaluated with ultrasound. 8. Large amount of stool throughout the colon. 9. There is a 3 mm nodule in the left lower lobe which is new from prior, indeterminate given other findings.   Aortic Atherosclerosis (ICD10-I70.0) and Emphysema (ICD10-J43.9).     Electronically Signed   By: Ronney Asters M.D.   On: 03/02/2022 01:54     ASSESSMENT/PLAN   Right upper lobe cavitary pneumonia with mass - respiratory cultures while inpatient -on augmentin for now -may need PET scan and biopsy on outpatient -he is immunosuppressed due to chronic prednisone >81month daily     Advanced COPD   - [facility]   - he uses albuterol prn   - please ecourage use of IS daily   -he takes prednisone at home chronically for past 6 months   Chronic hypoxemic respiratory failure  Continue Oxygen therapy at 4L/min      Thank you for allowing me to participate in the care of this patient.   Patient/Family are satisfied with care plan and all questions have been answered.    Provider disclosure: Patient with at least one acute or chronic illness or injury that poses a threat to life or bodily function and is being managed actively during this encounter.  All of the below services have been performed independently by signing provider:  review of prior  documentation from internal and or external health records.  Review of previous and current lab results.  Interview and  comprehensive assessment during patient visit today. Review of current and previous chest radiographs/CT scans. Discussion of management and test interpretation with health care team and patient/family.   This document was prepared using Dragon voice recognition software and may include unintentional dictation errors.     Ottie Glazier, M.D.  Division of Pulmonary & Critical Care Medicine

## 2022-03-03 NOTE — Progress Notes (Signed)
*  PRELIMINARY RESULTS* Echocardiogram 2D Echocardiogram has been performed.  Stanley Mclean 03/03/2022, 9:14 AM

## 2022-03-05 LAB — CULTURE, BLOOD (ROUTINE X 2)
Culture: NO GROWTH
Culture: NO GROWTH
Special Requests: ADEQUATE

## 2022-03-06 DIAGNOSIS — Z8673 Personal history of transient ischemic attack (TIA), and cerebral infarction without residual deficits: Secondary | ICD-10-CM | POA: Diagnosis not present

## 2022-03-06 DIAGNOSIS — Z7982 Long term (current) use of aspirin: Secondary | ICD-10-CM | POA: Diagnosis not present

## 2022-03-06 DIAGNOSIS — R0602 Shortness of breath: Secondary | ICD-10-CM | POA: Diagnosis not present

## 2022-03-06 DIAGNOSIS — K921 Melena: Secondary | ICD-10-CM | POA: Diagnosis not present

## 2022-03-06 DIAGNOSIS — J189 Pneumonia, unspecified organism: Secondary | ICD-10-CM | POA: Diagnosis not present

## 2022-03-06 LAB — MISC LABCORP TEST (SEND OUT)
LabCorp test name: 832599
Labcorp test code: 832599

## 2022-03-07 ENCOUNTER — Inpatient Hospital Stay: Payer: PPO

## 2022-03-07 ENCOUNTER — Inpatient Hospital Stay
Admission: EM | Admit: 2022-03-07 | Discharge: 2022-04-17 | DRG: 870 | Disposition: E | Payer: PPO | Attending: Internal Medicine | Admitting: Internal Medicine

## 2022-03-07 ENCOUNTER — Emergency Department: Payer: PPO

## 2022-03-07 DIAGNOSIS — E119 Type 2 diabetes mellitus without complications: Secondary | ICD-10-CM | POA: Diagnosis not present

## 2022-03-07 DIAGNOSIS — I5033 Acute on chronic diastolic (congestive) heart failure: Secondary | ICD-10-CM | POA: Diagnosis not present

## 2022-03-07 DIAGNOSIS — Z7952 Long term (current) use of systemic steroids: Secondary | ICD-10-CM

## 2022-03-07 DIAGNOSIS — Z1624 Resistance to multiple antibiotics: Secondary | ICD-10-CM | POA: Diagnosis present

## 2022-03-07 DIAGNOSIS — Z7984 Long term (current) use of oral hypoglycemic drugs: Secondary | ICD-10-CM

## 2022-03-07 DIAGNOSIS — J9621 Acute and chronic respiratory failure with hypoxia: Secondary | ICD-10-CM | POA: Diagnosis not present

## 2022-03-07 DIAGNOSIS — Z66 Do not resuscitate: Secondary | ICD-10-CM | POA: Diagnosis not present

## 2022-03-07 DIAGNOSIS — J9622 Acute and chronic respiratory failure with hypercapnia: Secondary | ICD-10-CM | POA: Diagnosis present

## 2022-03-07 DIAGNOSIS — Z9981 Dependence on supplemental oxygen: Secondary | ICD-10-CM

## 2022-03-07 DIAGNOSIS — N400 Enlarged prostate without lower urinary tract symptoms: Secondary | ICD-10-CM | POA: Diagnosis present

## 2022-03-07 DIAGNOSIS — G928 Other toxic encephalopathy: Secondary | ICD-10-CM | POA: Diagnosis present

## 2022-03-07 DIAGNOSIS — J441 Chronic obstructive pulmonary disease with (acute) exacerbation: Secondary | ICD-10-CM | POA: Diagnosis not present

## 2022-03-07 DIAGNOSIS — H3530 Unspecified macular degeneration: Secondary | ICD-10-CM | POA: Diagnosis present

## 2022-03-07 DIAGNOSIS — J188 Other pneumonia, unspecified organism: Secondary | ICD-10-CM | POA: Diagnosis not present

## 2022-03-07 DIAGNOSIS — J440 Chronic obstructive pulmonary disease with acute lower respiratory infection: Secondary | ICD-10-CM | POA: Diagnosis not present

## 2022-03-07 DIAGNOSIS — N170 Acute kidney failure with tubular necrosis: Secondary | ICD-10-CM | POA: Diagnosis present

## 2022-03-07 DIAGNOSIS — Z955 Presence of coronary angioplasty implant and graft: Secondary | ICD-10-CM

## 2022-03-07 DIAGNOSIS — I110 Hypertensive heart disease with heart failure: Secondary | ICD-10-CM | POA: Diagnosis present

## 2022-03-07 DIAGNOSIS — Z4659 Encounter for fitting and adjustment of other gastrointestinal appliance and device: Secondary | ICD-10-CM | POA: Diagnosis not present

## 2022-03-07 DIAGNOSIS — D638 Anemia in other chronic diseases classified elsewhere: Secondary | ICD-10-CM | POA: Diagnosis not present

## 2022-03-07 DIAGNOSIS — Z4682 Encounter for fitting and adjustment of non-vascular catheter: Secondary | ICD-10-CM | POA: Diagnosis not present

## 2022-03-07 DIAGNOSIS — R000 Tachycardia, unspecified: Secondary | ICD-10-CM | POA: Diagnosis not present

## 2022-03-07 DIAGNOSIS — R636 Underweight: Secondary | ICD-10-CM | POA: Diagnosis present

## 2022-03-07 DIAGNOSIS — Z818 Family history of other mental and behavioral disorders: Secondary | ICD-10-CM

## 2022-03-07 DIAGNOSIS — E870 Hyperosmolality and hypernatremia: Secondary | ICD-10-CM | POA: Diagnosis present

## 2022-03-07 DIAGNOSIS — Z8673 Personal history of transient ischemic attack (TIA), and cerebral infarction without residual deficits: Secondary | ICD-10-CM

## 2022-03-07 DIAGNOSIS — Z681 Body mass index (BMI) 19 or less, adult: Secondary | ICD-10-CM | POA: Diagnosis not present

## 2022-03-07 DIAGNOSIS — D696 Thrombocytopenia, unspecified: Secondary | ICD-10-CM | POA: Diagnosis not present

## 2022-03-07 DIAGNOSIS — J1100 Influenza due to unidentified influenza virus with unspecified type of pneumonia: Secondary | ICD-10-CM

## 2022-03-07 DIAGNOSIS — J984 Other disorders of lung: Secondary | ICD-10-CM | POA: Diagnosis not present

## 2022-03-07 DIAGNOSIS — F17210 Nicotine dependence, cigarettes, uncomplicated: Secondary | ICD-10-CM | POA: Diagnosis present

## 2022-03-07 DIAGNOSIS — G9341 Metabolic encephalopathy: Secondary | ICD-10-CM | POA: Diagnosis not present

## 2022-03-07 DIAGNOSIS — Z85828 Personal history of other malignant neoplasm of skin: Secondary | ICD-10-CM

## 2022-03-07 DIAGNOSIS — J9602 Acute respiratory failure with hypercapnia: Secondary | ICD-10-CM | POA: Diagnosis not present

## 2022-03-07 DIAGNOSIS — Z452 Encounter for adjustment and management of vascular access device: Secondary | ICD-10-CM | POA: Diagnosis not present

## 2022-03-07 DIAGNOSIS — J439 Emphysema, unspecified: Secondary | ICD-10-CM | POA: Diagnosis not present

## 2022-03-07 DIAGNOSIS — Z89511 Acquired absence of right leg below knee: Secondary | ICD-10-CM

## 2022-03-07 DIAGNOSIS — I2510 Atherosclerotic heart disease of native coronary artery without angina pectoris: Secondary | ICD-10-CM | POA: Diagnosis present

## 2022-03-07 DIAGNOSIS — R0689 Other abnormalities of breathing: Secondary | ICD-10-CM | POA: Diagnosis not present

## 2022-03-07 DIAGNOSIS — J1008 Influenza due to other identified influenza virus with other specified pneumonia: Secondary | ICD-10-CM | POA: Diagnosis not present

## 2022-03-07 DIAGNOSIS — J9601 Acute respiratory failure with hypoxia: Secondary | ICD-10-CM | POA: Diagnosis not present

## 2022-03-07 DIAGNOSIS — J1001 Influenza due to other identified influenza virus with the same other identified influenza virus pneumonia: Secondary | ICD-10-CM | POA: Diagnosis not present

## 2022-03-07 DIAGNOSIS — J13 Pneumonia due to Streptococcus pneumoniae: Secondary | ICD-10-CM | POA: Diagnosis present

## 2022-03-07 DIAGNOSIS — I10 Essential (primary) hypertension: Secondary | ICD-10-CM | POA: Diagnosis not present

## 2022-03-07 DIAGNOSIS — R918 Other nonspecific abnormal finding of lung field: Secondary | ICD-10-CM | POA: Diagnosis not present

## 2022-03-07 DIAGNOSIS — N179 Acute kidney failure, unspecified: Secondary | ICD-10-CM | POA: Diagnosis present

## 2022-03-07 DIAGNOSIS — Z1152 Encounter for screening for COVID-19: Secondary | ICD-10-CM

## 2022-03-07 DIAGNOSIS — R4182 Altered mental status, unspecified: Secondary | ICD-10-CM | POA: Diagnosis not present

## 2022-03-07 DIAGNOSIS — I959 Hypotension, unspecified: Secondary | ICD-10-CM | POA: Diagnosis not present

## 2022-03-07 DIAGNOSIS — Z515 Encounter for palliative care: Secondary | ICD-10-CM

## 2022-03-07 DIAGNOSIS — K219 Gastro-esophageal reflux disease without esophagitis: Secondary | ICD-10-CM | POA: Diagnosis present

## 2022-03-07 DIAGNOSIS — Z7189 Other specified counseling: Secondary | ICD-10-CM | POA: Diagnosis not present

## 2022-03-07 DIAGNOSIS — E43 Unspecified severe protein-calorie malnutrition: Secondary | ICD-10-CM | POA: Diagnosis present

## 2022-03-07 DIAGNOSIS — J9690 Respiratory failure, unspecified, unspecified whether with hypoxia or hypercapnia: Secondary | ICD-10-CM | POA: Diagnosis not present

## 2022-03-07 DIAGNOSIS — L89151 Pressure ulcer of sacral region, stage 1: Secondary | ICD-10-CM | POA: Diagnosis not present

## 2022-03-07 DIAGNOSIS — R0600 Dyspnea, unspecified: Secondary | ICD-10-CM | POA: Diagnosis not present

## 2022-03-07 DIAGNOSIS — R451 Restlessness and agitation: Secondary | ICD-10-CM | POA: Diagnosis not present

## 2022-03-07 DIAGNOSIS — J189 Pneumonia, unspecified organism: Secondary | ICD-10-CM | POA: Diagnosis not present

## 2022-03-07 DIAGNOSIS — E7800 Pure hypercholesterolemia, unspecified: Secondary | ICD-10-CM | POA: Diagnosis present

## 2022-03-07 DIAGNOSIS — Z7982 Long term (current) use of aspirin: Secondary | ICD-10-CM

## 2022-03-07 DIAGNOSIS — J9600 Acute respiratory failure, unspecified whether with hypoxia or hypercapnia: Secondary | ICD-10-CM | POA: Diagnosis not present

## 2022-03-07 DIAGNOSIS — R34 Anuria and oliguria: Secondary | ICD-10-CM | POA: Diagnosis not present

## 2022-03-07 DIAGNOSIS — R6521 Severe sepsis with septic shock: Secondary | ICD-10-CM | POA: Diagnosis present

## 2022-03-07 DIAGNOSIS — A4189 Other specified sepsis: Secondary | ICD-10-CM | POA: Diagnosis not present

## 2022-03-07 DIAGNOSIS — R0902 Hypoxemia: Secondary | ICD-10-CM | POA: Diagnosis not present

## 2022-03-07 DIAGNOSIS — R7989 Other specified abnormal findings of blood chemistry: Secondary | ICD-10-CM | POA: Diagnosis not present

## 2022-03-07 DIAGNOSIS — Z7902 Long term (current) use of antithrombotics/antiplatelets: Secondary | ICD-10-CM

## 2022-03-07 DIAGNOSIS — Z79899 Other long term (current) drug therapy: Secondary | ICD-10-CM

## 2022-03-07 DIAGNOSIS — N200 Calculus of kidney: Secondary | ICD-10-CM | POA: Diagnosis not present

## 2022-03-07 DIAGNOSIS — Z7951 Long term (current) use of inhaled steroids: Secondary | ICD-10-CM

## 2022-03-07 LAB — URINALYSIS, COMPLETE (UACMP) WITH MICROSCOPIC
Bilirubin Urine: NEGATIVE
Glucose, UA: >=500 mg/dL — AB
Hgb urine dipstick: NEGATIVE
Ketones, ur: 5 mg/dL — AB
Leukocytes,Ua: NEGATIVE
Nitrite: NEGATIVE
Protein, ur: >=300 mg/dL — AB
Specific Gravity, Urine: 1.014 (ref 1.005–1.030)
pH: 7 (ref 5.0–8.0)

## 2022-03-07 LAB — CBC WITH DIFFERENTIAL/PLATELET
Abs Immature Granulocytes: 0.08 10*3/uL — ABNORMAL HIGH (ref 0.00–0.07)
Basophils Absolute: 0 10*3/uL (ref 0.0–0.1)
Basophils Relative: 0 %
Eosinophils Absolute: 0 10*3/uL (ref 0.0–0.5)
Eosinophils Relative: 0 %
HCT: 36.8 % — ABNORMAL LOW (ref 39.0–52.0)
Hemoglobin: 11.3 g/dL — ABNORMAL LOW (ref 13.0–17.0)
Immature Granulocytes: 1 %
Lymphocytes Relative: 10 %
Lymphs Abs: 1.1 10*3/uL (ref 0.7–4.0)
MCH: 30.5 pg (ref 26.0–34.0)
MCHC: 30.7 g/dL (ref 30.0–36.0)
MCV: 99.2 fL (ref 80.0–100.0)
Monocytes Absolute: 0.5 10*3/uL (ref 0.1–1.0)
Monocytes Relative: 4 %
Neutro Abs: 8.9 10*3/uL — ABNORMAL HIGH (ref 1.7–7.7)
Neutrophils Relative %: 85 %
Platelets: 231 10*3/uL (ref 150–400)
RBC: 3.71 MIL/uL — ABNORMAL LOW (ref 4.22–5.81)
RDW: 13.7 % (ref 11.5–15.5)
WBC: 10.6 10*3/uL — ABNORMAL HIGH (ref 4.0–10.5)
nRBC: 0 % (ref 0.0–0.2)

## 2022-03-07 LAB — COMPREHENSIVE METABOLIC PANEL
ALT: 39 U/L (ref 0–44)
AST: 45 U/L — ABNORMAL HIGH (ref 15–41)
Albumin: 3.7 g/dL (ref 3.5–5.0)
Alkaline Phosphatase: 96 U/L (ref 38–126)
Anion gap: 11 (ref 5–15)
BUN: 22 mg/dL (ref 8–23)
CO2: 34 mmol/L — ABNORMAL HIGH (ref 22–32)
Calcium: 9.4 mg/dL (ref 8.9–10.3)
Chloride: 99 mmol/L (ref 98–111)
Creatinine, Ser: 1.24 mg/dL (ref 0.61–1.24)
GFR, Estimated: 59 mL/min — ABNORMAL LOW (ref >60)
Glucose, Bld: 188 mg/dL — ABNORMAL HIGH (ref 70–99)
Potassium: 4.2 mmol/L (ref 3.5–5.1)
Sodium: 144 mmol/L (ref 135–145)
Total Bilirubin: 0.9 mg/dL (ref 0.3–1.2)
Total Protein: 7.5 g/dL (ref 6.5–8.1)

## 2022-03-07 LAB — BLOOD GAS, ARTERIAL
Acid-Base Excess: 4.3 mmol/L — ABNORMAL HIGH (ref 0.0–2.0)
Acid-Base Excess: 7 mmol/L — ABNORMAL HIGH (ref 0.0–2.0)
Bicarbonate: 34.2 mmol/L — ABNORMAL HIGH (ref 20.0–28.0)
Bicarbonate: 34.5 mmol/L — ABNORMAL HIGH (ref 20.0–28.0)
FIO2: 0.35 %
FIO2: 40 %
MECHVT: 500 mL
MECHVT: 500 mL
O2 Saturation: 94.5 %
O2 Saturation: 99.6 %
PEEP: 5 cmH2O
PEEP: 5 cmH2O
Patient temperature: 37
Patient temperature: 37
RATE: 18 resp/min
RATE: 22 resp/min
pCO2 arterial: 78 mmHg (ref 32–48)
pCO2 arterial: 61 mmHg — ABNORMAL HIGH (ref 32–48)
pH, Arterial: 7.25 — ABNORMAL LOW (ref 7.35–7.45)
pH, Arterial: 7.36 (ref 7.35–7.45)
pO2, Arterial: 75 mmHg — ABNORMAL LOW (ref 83–108)
pO2, Arterial: 114 mmHg — ABNORMAL HIGH (ref 83–108)

## 2022-03-07 LAB — TROPONIN I (HIGH SENSITIVITY)
Troponin I (High Sensitivity): 34 ng/L — ABNORMAL HIGH (ref <18)
Troponin I (High Sensitivity): 48 ng/L — ABNORMAL HIGH (ref <18)

## 2022-03-07 LAB — BLOOD GAS, VENOUS
Acid-Base Excess: 9.4 mmol/L — ABNORMAL HIGH (ref 0.0–2.0)
Bicarbonate: 41 mmol/L — ABNORMAL HIGH (ref 20.0–28.0)
O2 Saturation: 89.6 %
Patient temperature: 37
pCO2, Ven: 98 mmHg (ref 44–60)
pH, Ven: 7.23 — ABNORMAL LOW (ref 7.25–7.43)
pO2, Ven: 67 mmHg — ABNORMAL HIGH (ref 32–45)

## 2022-03-07 LAB — PROTIME-INR
INR: 1 (ref 0.8–1.2)
Prothrombin Time: 13.2 seconds (ref 11.4–15.2)

## 2022-03-07 LAB — GLUCOSE, CAPILLARY
Glucose-Capillary: 231 mg/dL — ABNORMAL HIGH (ref 70–99)
Glucose-Capillary: 253 mg/dL — ABNORMAL HIGH (ref 70–99)
Glucose-Capillary: 180 mg/dL — ABNORMAL HIGH (ref 70–99)

## 2022-03-07 LAB — RESP PANEL BY RT-PCR (RSV, FLU A&B, COVID)  RVPGX2
Influenza A by PCR: POSITIVE — AB
Influenza B by PCR: NEGATIVE
Resp Syncytial Virus by PCR: NEGATIVE
SARS Coronavirus 2 by RT PCR: NEGATIVE

## 2022-03-07 LAB — LACTIC ACID, PLASMA
Lactic Acid, Venous: 1.8 mmol/L (ref 0.5–1.9)
Lactic Acid, Venous: 2.6 mmol/L (ref 0.5–1.9)

## 2022-03-07 LAB — STREP PNEUMONIAE URINARY ANTIGEN: Strep Pneumo Urinary Antigen: NEGATIVE

## 2022-03-07 LAB — PROCALCITONIN: Procalcitonin: 0.11 ng/mL

## 2022-03-07 MED ORDER — POLYETHYLENE GLYCOL 3350 17 G PO PACK: 17.0000 g | PACK | Freq: Every day | ORAL | Status: DC | PRN

## 2022-03-07 MED ORDER — SODIUM CHLORIDE 3 % IN NEBU
4.0000 mL | INHALATION_SOLUTION | Freq: Two times a day (BID) | RESPIRATORY_TRACT | Status: DC
  Administered 2022-03-07 – 2022-03-09 (×5): 4 mL via RESPIRATORY_TRACT
  Filled 2022-03-07 (×6): qty 4

## 2022-03-07 MED ORDER — POLYETHYLENE GLYCOL 3350 17 G PO PACK
17.0000 g | PACK | Freq: Every day | ORAL | Status: DC
  Administered 2022-03-08: 17 g
  Filled 2022-03-07 (×3): qty 1

## 2022-03-07 MED ORDER — ETOMIDATE 2 MG/ML IV SOLN
INTRAVENOUS | Status: AC
  Administered 2022-03-07: 15 mg via INTRAVENOUS
  Filled 2022-03-07: qty 10

## 2022-03-07 MED ORDER — SODIUM CHLORIDE 0.9 % IV BOLUS
1000.0000 mL | Freq: Once | INTRAVENOUS | Status: AC
  Administered 2022-03-07: 1000 mL via INTRAVENOUS

## 2022-03-07 MED ORDER — OSELTAMIVIR PHOSPHATE 30 MG PO CAPS
30.0000 mg | ORAL_CAPSULE | Freq: Two times a day (BID) | ORAL | Status: DC
  Filled 2022-03-07: qty 1

## 2022-03-07 MED ORDER — BUDESONIDE 0.5 MG/2ML IN SUSP
0.5000 mg | Freq: Two times a day (BID) | RESPIRATORY_TRACT | Status: DC
  Administered 2022-03-07 – 2022-03-08 (×2): 0.5 mg via RESPIRATORY_TRACT
  Filled 2022-03-07 (×2): qty 2

## 2022-03-07 MED ORDER — FENTANYL CITRATE PF 50 MCG/ML IJ SOSY
25.0000 ug | PREFILLED_SYRINGE | INTRAMUSCULAR | Status: DC | PRN
  Administered 2022-03-07: 25 ug via INTRAVENOUS
  Filled 2022-03-07: qty 1

## 2022-03-07 MED ORDER — SODIUM CHLORIDE 0.9 % IV BOLUS
500.0000 mL | Freq: Once | INTRAVENOUS | Status: AC
  Administered 2022-03-07: 500 mL via INTRAVENOUS

## 2022-03-07 MED ORDER — PANTOPRAZOLE SODIUM 40 MG IV SOLR
40.0000 mg | Freq: Every day | INTRAVENOUS | Status: DC
  Administered 2022-03-07 – 2022-03-11 (×5): 40 mg via INTRAVENOUS
  Filled 2022-03-07 (×5): qty 10

## 2022-03-07 MED ORDER — HEPARIN SODIUM (PORCINE) 5000 UNIT/ML IJ SOLN
5000.0000 [IU] | Freq: Three times a day (TID) | INTRAMUSCULAR | Status: DC
  Administered 2022-03-07 – 2022-03-18 (×30): 5000 [IU] via SUBCUTANEOUS
  Filled 2022-03-07 (×32): qty 1

## 2022-03-07 MED ORDER — SODIUM CHLORIDE 0.9 % IV SOLN
500.0000 mg | INTRAVENOUS | Status: DC
  Administered 2022-03-08 – 2022-03-09 (×2): 500 mg via INTRAVENOUS
  Filled 2022-03-07 (×2): qty 500

## 2022-03-07 MED ORDER — INSULIN ASPART 100 UNIT/ML IJ SOLN
0.0000 [IU] | INTRAMUSCULAR | Status: DC
  Administered 2022-03-07: 3 [IU] via SUBCUTANEOUS
  Administered 2022-03-07: 5 [IU] via SUBCUTANEOUS
  Administered 2022-03-07: 8 [IU] via SUBCUTANEOUS
  Administered 2022-03-08: 3 [IU] via SUBCUTANEOUS
  Administered 2022-03-08: 5 [IU] via SUBCUTANEOUS
  Administered 2022-03-08 (×2): 8 [IU] via SUBCUTANEOUS
  Administered 2022-03-08: 5 [IU] via SUBCUTANEOUS
  Administered 2022-03-09 (×3): 3 [IU] via SUBCUTANEOUS
  Administered 2022-03-09: 5 [IU] via SUBCUTANEOUS
  Administered 2022-03-10: 2 [IU] via SUBCUTANEOUS
  Administered 2022-03-10 (×2): 3 [IU] via SUBCUTANEOUS
  Administered 2022-03-11 (×3): 2 [IU] via SUBCUTANEOUS
  Administered 2022-03-11 – 2022-03-12 (×2): 3 [IU] via SUBCUTANEOUS
  Administered 2022-03-12: 5 [IU] via SUBCUTANEOUS
  Filled 2022-03-07 (×21): qty 1

## 2022-03-07 MED ORDER — FENTANYL CITRATE PF 50 MCG/ML IJ SOSY
25.0000 ug | PREFILLED_SYRINGE | INTRAMUSCULAR | Status: DC | PRN
  Administered 2022-03-08: 100 ug via INTRAVENOUS
  Filled 2022-03-07: qty 2

## 2022-03-07 MED ORDER — ATORVASTATIN CALCIUM 20 MG PO TABS
40.0000 mg | ORAL_TABLET | Freq: Every day | ORAL | Status: DC
  Administered 2022-03-07: 40 mg via ORAL
  Filled 2022-03-07: qty 2

## 2022-03-07 MED ORDER — SODIUM CHLORIDE 0.9 % IV SOLN
2.0000 g | Freq: Once | INTRAVENOUS | Status: AC
  Administered 2022-03-07: 2 g via INTRAVENOUS
  Filled 2022-03-07: qty 12.5

## 2022-03-07 MED ORDER — OSELTAMIVIR PHOSPHATE 75 MG PO CAPS
75.0000 mg | ORAL_CAPSULE | Freq: Once | ORAL | Status: AC
  Administered 2022-03-07: 75 mg
  Filled 2022-03-07: qty 1

## 2022-03-07 MED ORDER — ROCURONIUM BROMIDE 10 MG/ML (PF) SYRINGE: 50.0000 mg | PREFILLED_SYRINGE | Freq: Once | INTRAVENOUS | Status: AC

## 2022-03-07 MED ORDER — METHYLPREDNISOLONE SODIUM SUCC 40 MG IJ SOLR
40.0000 mg | Freq: Every day | INTRAMUSCULAR | Status: DC
  Administered 2022-03-08: 40 mg via INTRAVENOUS
  Filled 2022-03-07: qty 1

## 2022-03-07 MED ORDER — FENTANYL 2500MCG IN NS 250ML (10MCG/ML) PREMIX INFUSION
0.0000 ug/h | INTRAVENOUS | Status: DC
  Administered 2022-03-07: 25 ug/h via INTRAVENOUS
  Filled 2022-03-07: qty 250

## 2022-03-07 MED ORDER — TAMSULOSIN HCL 0.4 MG PO CAPS
0.4000 mg | ORAL_CAPSULE | Freq: Every day | ORAL | Status: DC
  Administered 2022-03-07 – 2022-03-08 (×2): 0.4 mg via ORAL
  Filled 2022-03-07 (×2): qty 1

## 2022-03-07 MED ORDER — ROCURONIUM BROMIDE 10 MG/ML (PF) SYRINGE
PREFILLED_SYRINGE | INTRAVENOUS | Status: AC
  Administered 2022-03-07: 50 mg via INTRAVENOUS
  Filled 2022-03-07: qty 10

## 2022-03-07 MED ORDER — IPRATROPIUM-ALBUTEROL 0.5-2.5 (3) MG/3ML IN SOLN
3.0000 mL | Freq: Four times a day (QID) | RESPIRATORY_TRACT | Status: DC
  Administered 2022-03-07 – 2022-03-18 (×43): 3 mL via RESPIRATORY_TRACT
  Filled 2022-03-07 (×43): qty 3

## 2022-03-07 MED ORDER — ETOMIDATE 2 MG/ML IV SOLN: 15.0000 mg | Freq: Once | INTRAVENOUS | Status: AC

## 2022-03-07 MED ORDER — SODIUM CHLORIDE 0.9 % IV SOLN
500.0000 mg | Freq: Once | INTRAVENOUS | Status: AC
  Administered 2022-03-07: 500 mg via INTRAVENOUS
  Filled 2022-03-07: qty 5

## 2022-03-07 MED ORDER — ASPIRIN 81 MG PO TBEC
81.0000 mg | DELAYED_RELEASE_TABLET | Freq: Every day | ORAL | Status: DC
  Administered 2022-03-07: 81 mg via ORAL
  Filled 2022-03-07: qty 1

## 2022-03-07 MED ORDER — PIPERACILLIN-TAZOBACTAM 3.375 G IVPB
3.3750 g | Freq: Three times a day (TID) | INTRAVENOUS | Status: DC
  Administered 2022-03-07 – 2022-03-09 (×5): 3.375 g via INTRAVENOUS
  Filled 2022-03-07 (×5): qty 50

## 2022-03-07 MED ORDER — IPRATROPIUM-ALBUTEROL 0.5-2.5 (3) MG/3ML IN SOLN
3.0000 mL | Freq: Once | RESPIRATORY_TRACT | Status: AC
  Administered 2022-03-07: 3 mL via RESPIRATORY_TRACT
  Filled 2022-03-07: qty 3

## 2022-03-07 MED ORDER — CLOPIDOGREL BISULFATE 75 MG PO TABS
75.0000 mg | ORAL_TABLET | Freq: Every day | ORAL | Status: DC
  Administered 2022-03-08 – 2022-03-10 (×2): 75 mg
  Filled 2022-03-07 (×4): qty 1

## 2022-03-07 MED ORDER — DOCUSATE SODIUM 50 MG/5ML PO LIQD
100.0000 mg | Freq: Two times a day (BID) | ORAL | Status: DC
  Administered 2022-03-07 – 2022-03-08 (×3): 100 mg
  Filled 2022-03-07 (×5): qty 10

## 2022-03-07 MED ORDER — LACTATED RINGERS IV BOLUS
1000.0000 mL | Freq: Once | INTRAVENOUS | Status: AC
  Administered 2022-03-07: 1000 mL via INTRAVENOUS

## 2022-03-07 MED ORDER — ASPIRIN 81 MG PO CHEW
81.0000 mg | CHEWABLE_TABLET | Freq: Every day | ORAL | Status: DC
  Administered 2022-03-08 – 2022-03-18 (×9): 81 mg
  Filled 2022-03-07 (×10): qty 1

## 2022-03-07 MED ORDER — VANCOMYCIN HCL IN DEXTROSE 1-5 GM/200ML-% IV SOLN
1000.0000 mg | Freq: Once | INTRAVENOUS | Status: AC
  Administered 2022-03-07: 1000 mg via INTRAVENOUS
  Filled 2022-03-07: qty 200

## 2022-03-07 MED ORDER — DOCUSATE SODIUM 100 MG PO CAPS: 100.0000 mg | ORAL_CAPSULE | Freq: Two times a day (BID) | ORAL | Status: DC | PRN

## 2022-03-07 MED ORDER — SODIUM CHLORIDE 0.9 % IV SOLN
250.0000 mL | INTRAVENOUS | Status: DC
  Administered 2022-03-07: 250 mL via INTRAVENOUS

## 2022-03-07 MED ORDER — NOREPINEPHRINE 4 MG/250ML-% IV SOLN
2.0000 ug/min | INTRAVENOUS | Status: DC
  Administered 2022-03-07: 5 ug/min via INTRAVENOUS
  Administered 2022-03-08 (×2): 6 ug/min via INTRAVENOUS
  Administered 2022-03-08: 9 ug/min via INTRAVENOUS
  Filled 2022-03-07 (×4): qty 250

## 2022-03-07 MED ORDER — ORAL CARE MOUTH RINSE: 15.0000 mL | OROMUCOSAL | Status: DC | PRN

## 2022-03-07 MED ORDER — CLOPIDOGREL BISULFATE 75 MG PO TABS
75.0000 mg | ORAL_TABLET | Freq: Every day | ORAL | Status: DC
  Administered 2022-03-07: 75 mg via ORAL
  Filled 2022-03-07: qty 1

## 2022-03-07 MED ORDER — ATORVASTATIN CALCIUM 20 MG PO TABS
40.0000 mg | ORAL_TABLET | Freq: Every day | ORAL | Status: DC
  Administered 2022-03-08 – 2022-03-18 (×10): 40 mg
  Filled 2022-03-07 (×11): qty 2

## 2022-03-07 MED ORDER — PROPOFOL 1000 MG/100ML IV EMUL
0.0000 ug/kg/min | INTRAVENOUS | Status: DC
  Administered 2022-03-07 (×2): 5 ug/kg/min via INTRAVENOUS
  Administered 2022-03-08 (×2): 40 ug/kg/min via INTRAVENOUS
  Administered 2022-03-09: 45 ug/kg/min via INTRAVENOUS
  Filled 2022-03-07 (×5): qty 100

## 2022-03-07 MED ORDER — ORAL CARE MOUTH RINSE
15.0000 mL | OROMUCOSAL | Status: DC
  Administered 2022-03-08 – 2022-03-10 (×28): 15 mL via OROMUCOSAL

## 2022-03-07 NOTE — Progress Notes (Signed)
Received patient from ED who was intubated on arrival and on vent.  Patient was noted to have redness to the sacral area that is blanchable. Patient was admitted with propofol and fentanyl drip, patient was noted to begin waking up and required an increase in sedation.  Once patient was settled Dr. Genia Harold came to bedside and asked for fentanyl drip to be turned off which required increase in propofol in turn causing patient's B/P to drop, requested vasopressor to support patient's B/P with increase in propofol requirement, no vasopressor ordered, received order for NS bolus which was administered and will endorse to oncoming nurse.

## 2022-03-07 NOTE — ED Provider Notes (Signed)
-----------------------------------------  4:58 PM on 03/10/2022 -----------------------------------------   I took over care of this patient from Dr. Charna Archer.  Chest x-ray shows no evidence of pneumonia.  VBG shows elevated pCO2.  Respiratory panel is positive for influenza A.  I consulted Dr. Genia Harold from the ICU; based on discussion he agrees to admit the patient.   Arta Silence, MD 03/12/2022 2352

## 2022-03-07 NOTE — H&P (Signed)
NAME:  Stanley Mclean, MRN:  [id number], DOB:  1940-04-27, LOS: 0 ADMISSION DATE:  03/04/2022, CONSULTATION DATE:  02/24/2022 REFERRING MD:  Dr. Cherylann Banas, CHIEF COMPLAINT: Shortness of breath  Brief Pt Description / Synopsis:  81 year old male admitted with Severe Sepsis and acute on chronic hypoxic and hypercapnic respiratory failure in the setting of AECOPD due to influenza A infection and multifocal pneumonia requiring intubation and mechanical ventilation.  History of Present Illness:  Stanley Mclean is a 81 year old male with a past medical history significant for COPD requiring 4 L supplemental oxygen at baseline, CAD, hypertension, diabetes mellitus, stroke who presents to [facility] ED on 02/20/2022 due to shortness of breath.  Patient currently is intubated and currently no family is available to contribute to history, therefore history obtained from chart review.  Per ED and nursing notes, the patient's family contacted EMS due to shortness of breath.  Upon EMS arrival he was noted to be hypoxic with O2 saturations of 82% on his baseline 4 L nasal cannula.  EMS noted severe wheezing with poor air movement of which she was given 125 mg Solu-Medrol, 4 g of magnesium, and 3 DuoNeb's.  Upon arrival to the ED he continued to have respiratory distress, only able to talk in single words.  Of note he was recently admitted at [facility] from 02/28/2022 through 03/03/2022 for acute metabolic encephalopathy in the setting of acute small left PCA territory stroke, multifocal pneumonia, and cavitary lesion concerning for neoplasm versus infection.  He was discharged on Augmentin and azithromycin and was to follow-up with pulmonologist Dr. Lanney Gins.  ED Course: Initial vital signs: Temperature 98, respiratory rate 23, pulse 125, blood pressure 96/65 Significant Labs: Bicarb 34, glucose 188, BUN 22, creatinine 1.24, AST 45, high-sensitivity troponin 34, lactic acid 1.8, procalcitonin 0.11, WBC 10.6, hemoglobin  11.3, hematocrit 36.8 Positive for influenza A Urinalysis negative for UTI Imaging Chest X-ray>>FINDINGS: The heart size and mediastinal contours are within normal limits. Nasogastric tube is seen in right mainstem bronchus. Endotracheal tube is in good position. Bilateral lung opacities are again noted concerning for multifocal pneumonia. The visualized skeletal structures are unremarkable CT Chest w/o Contrast>>IMPRESSION: 1. Extensive bilateral heterogeneous ground-glass and consolidative airspace opacity throughout the lungs is slightly worsened compared to prior examination. Findings are consistent with worsened multifocal infection. 2. No significant change in a serpiginous, thick-walled cavitary lesion about the peripheral left lung, the largest axial component at the posterolateral left upper lobe measuring 4.4 x 2.5 cm. 3. Endotracheal intubation. 4. Severe emphysema and diffuse bilateral bronchial wall thickening. 5. Frothy debris in the lower trachea and left mainstem bronchus. 6. Coronary artery disease. 7. Nonobstructive right nephrolithiasis. Medications Administered: 1 L normal saline bolus, IV cefepime, vancomycin, azithromycin  While in the ED his mental status worsened which he became minimally responsive and somnolent which he was subsequently intubated by the ED provider.  PCCM is asked to admit the patient for further workup and treatment.  Please see "significant hospital events" section below for full detailed hospital course.  Pertinent  Medical History   Past Medical History:  Diagnosis Date   Anemia    Basal cell carcinoma    BPH (benign prostatic hyperplasia)    CAD (coronary artery disease)    a. s/p multiple prior PCIs b. s/p DES-distal RCA 05/2013   COPD (chronic obstructive pulmonary disease) (HCC)    GERD (gastroesophageal reflux disease)    Macular degeneration    Pure hypercholesterolemia    Stroke ([place])  Tobacco abuse    Traumatic  amputation of leg(s) (complete) (partial), unilateral, level not specified, without mention of complication 56/43/3295   Right Below Knee Amputation   Unspecified essential hypertension      Micro Data:  12/22: SARS-CoV-2 PCR>> negative 12/22: + Influenza A 12/22: Blood culture>> 12/22: Strep pneumo urinary antigen>> 12/22: Legionella urinary antigen>> 12/22: Mycoplasma pneumoniae>> 12/22: Tracheal aspirate>> 12/22: Fungitell>>  Antimicrobials:  Cefepime 12/22 x 1 dose Azithromycin 12/22>> Zosyn 12/22>>  Significant Hospital Events: Including procedures, antibiotic start and stop dates in addition to other pertinent events   12/22: Presented to ED, required intubation in the ED.  PCCM asked to admit  Interim History / Subjective:  -Patient presented to ED, required intubation due to hypercapnia and lethargy -Currently lightly sedated on propofol, hemodynamic stable  Objective   Blood pressure (!) 143/83, pulse (!) 163, resp. rate 14, height _0  (1.753 m), weight 52.8 kg, SpO2 100 %.    Vent Mode: AC FiO2 (%):  [35 %] 35 % Set Rate:  [18 bmp] 18 bmp Vt Set:  [500 mL] 500 mL PEEP:  [5 cmH20] 5 cmH20  No intake or output data in the 24 hours ending 03/12/2022 1723 Filed Weights   03/16/2022 1508  Weight: 52.8 kg    Examination: General: Acute on chronically ill-appearing male, laying in bed, intubated sedated, no acute distress HENT: Atraumatic, normocephalic, neck supple, no JVD, orally intubated Lungs: Coarse breath sounds bilaterally, no wheezing noted, even, synchronous with the vent Cardiovascular: Regular rate and rhythm, S1-S2, no murmurs, rubs, gallops Abdomen: Soft, nontender, nondistended, no guarding rebound tenderness, bowel sounds positive x 4 Extremities: Normal bulk and tone, no deformities, no edema Neuro: Sedated, withdraws from pain, currently not following commands, pupils PERRLA GU: Foley catheter currently being placed by nursing with return of  yellow urine  Resolved Hospital Problem list     Assessment & Plan:   Acute on chronic Hypoxic & Hypercapnic Respiratory Failure in setting of AECOPD due to influenza A infection and multifocal pneumonia -Full vent support, implement lung protective strategies -Plateau pressures less than 30 cm H20 -Wean FiO2 & PEEP as tolerated to maintain O2 sats >92% -Follow intermittent Chest X-ray & ABG as needed -Spontaneous Breathing Trials when respiratory parameters met and mental status permits -Implement VAP Bundle -Bronchodilators and Pulmicort nebs -IV Solu-Medrol -Antibiotics as above  Severe Sepsis due to Influenza A infection & Multifocal pneumonia (Met SIRS Criteria upon admission: RR >20, HR 100) -Monitor fever curve -Trend WBC's & Procalcitonin -Follow cultures as above -Continue empiric azithromycin and Zosyn pending cultures & sensitivities  Mildly elevated troponin, suspect demand ischemia PMHx: HTN, HLD, CAD s/p stent Echocardiogram 03/03/2022: LVEF 55 to 18%, grade 2 diastolic dysfunction, RV systolic function normal -Continuous cardiac monitoring -Maintain MAP >65 -IV fluids -Vasopressors as needed to maintain MAP goal -Trend lactic acid until normalized -Trend HS Troponin until peaked  Anemia of chronic disease -Monitor for S/Sx of bleeding -Trend CBC -Heparin subcu for VTE Prophylaxis  -Transfuse for Hgb <7  Acute metabolic encephalopathy, suspect CO2 narcosis Sedation needs in the setting of mechanical ventilation PMHx: small left PCA territory stroke  -Treatment of hypercapnia and metabolic derangements as outlined above -Maintain a RASS goal of 0 to -1 -Fentanyl and propofol as needed to maintain RASS goal -Avoid sedating medications as able -Daily wake up assessment -Aspirin and Plavix -Consider head CT   Patient is critically ill with multiple chronic comorbidities.  Prognosis is guarded.  Recommend DNR status.  Best Practice (right click and  "Reselect all SmartList Selections" daily)   Diet/type: NPO DVT prophylaxis: prophylactic heparin  GI prophylaxis: PPI Lines: N/A Foley:  Yes, and it is still needed Code Status:  full code Last date of multidisciplinary goals of care discussion [N/A]  Labs   CBC: Recent Labs  Lab 03/01/22 0639 03/12/2022 1529  WBC 11.7* 10.6*  NEUTROABS  --  8.9*  HGB 11.9* 11.3*  HCT 37.0* 36.8*  MCV 94.6 99.2  PLT 221 659    Basic Metabolic Panel: Recent Labs  Lab 03/01/22 0645 03/04/2022 1529  NA 140 144  K 3.6 4.2  CL 100 99  CO2 32 34*  GLUCOSE 173* 188*  BUN 14 22  CREATININE 0.86 1.24  CALCIUM 8.5* 9.4  MG 1.9  --    GFR: Estimated Creatinine Clearance: 36.1 mL/min (by C-G formula based on SCr of 1.24 mg/dL). Recent Labs  Lab 02/28/22 1833 03/01/22 0639 03/01/22 0645 03/03/22 1331 03/11/2022 1529  PROCALCITON  --   --  <0.10 0.11 0.11  WBC  --  11.7*  --   --  10.6*  LATICACIDVEN 1.2  --   --   --  1.8    Liver Function Tests: Recent Labs  Lab 03/15/2022 1529  AST 45*  ALT 39  ALKPHOS 96  BILITOT 0.9  PROT 7.5  ALBUMIN 3.7   No results for input(s): "LIPASE", "AMYLASE" in the last 168 hours. No results for input(s): "AMMONIA" in the last 168 hours.  ABG    Component Value Date/Time   HCO3 41.0 (H) 02/26/2022 1532   O2SAT 89.6 03/05/2022 1532     Coagulation Profile: Recent Labs  Lab 02/25/2022 1529  INR 1.0    Cardiac Enzymes: No results for input(s): "CKTOTAL", "CKMB", "CKMBINDEX", "TROPONINI" in the last 168 hours.  HbA1C: Hemoglobin A1C  Date/Time Value Ref Range Status  07/07/2017 12:00 AM 8.2  Final  06/18/2016 03:18 PM 7.9  Final   Hgb A1c MFr Bld  Date/Time Value Ref Range Status  03/01/2022 06:45 AM 9.0 (H) 4.8 - 5.6 % Final    Comment:    (NOTE)         Prediabetes: 5.7 - 6.4         Diabetes: >6.4         Glycemic control for adults with diabetes: <7.0   02/23/2021 12:16 AM 7.4 (H) 4.8 - 5.6 % Final    Comment:    (NOTE)          Prediabetes: 5.7 - 6.4         Diabetes: >6.4         Glycemic control for adults with diabetes: <7.0     CBG: Recent Labs  Lab 03/02/22 1126 03/02/22 1641 03/02/22 2102 03/03/22 0729 03/03/22 1141  GLUCAP 306* 264* 273* 260* 165*    Review of Systems:   Unable to assess due to intubation/sedation/critical illness   Past Medical History:  He,  has a past medical history of Anemia, Basal cell carcinoma, BPH (benign prostatic hyperplasia), CAD (coronary artery disease), COPD (chronic obstructive pulmonary disease) ([place]), GERD (gastroesophageal reflux disease), Macular degeneration, Pure hypercholesterolemia, Stroke ([place]), Tobacco abuse, Traumatic amputation of leg(s) (complete) (partial), unilateral, level not specified, without mention of complication (93/57/0177), and Unspecified essential hypertension.   Surgical History:   Past Surgical History:  Procedure Laterality Date   AMPUTATION Right 08/26/2011   Below Knee Amputee   APPENDECTOMY     CARDIAC CATHETERIZATION  CAROTID ANGIOGRAPHY Right 05/14/2016   Procedure: Carotid Angiography;  Surgeon: Katha Cabal, MD;  Location: [place] [facility];  Service: Cardiovascular;  Laterality: Right;   CAROTID PTA/STENT INTERVENTION  05/14/2016   Procedure: Carotid PTA/Stent Intervention;  Surgeon: Katha Cabal, MD;  Location: [place] [facility];  Service: Cardiovascular;;   CORONARY ANGIOPLASTY WITH STENT PLACEMENT  09/28/2006   RCA   CORONARY ANGIOPLASTY WITH STENT PLACEMENT  07/21/2006   Mid LAD   CORONARY ANGIOPLASTY WITH STENT PLACEMENT  06/06/2013   30% prox LAD at the site of a prior stent, 40% mid LAD at the origin of D1, 40% ostial D1, 30% prox LCx at the site of a prior stent, in the proximal third of the vessel segment, 20% prox RCA, 99% distal RCA s/p DES. EF >55%.     Social History:   reports that he has been smoking cigarettes. He has a 60.00 pack-year smoking history. He has never used smokeless  tobacco. He reports that he does not drink alcohol and does not use drugs.   Family History:  His family history includes Mental illness in his mother.   Allergies No Known Allergies   Home Medications  Prior to Admission medications   Medication Sig Start Date End Date Taking? Authorizing Provider  albuterol (VENTOLIN HFA) 108 (90 Base) MCG/ACT inhaler Inhale 2 puffs into the lungs every 6 (six) hours as needed for wheezing or shortness of breath. 03/03/22 06/01/22  Fritzi Mandes, MD  amLODipine (NORVASC) 10 MG tablet TAKE ONE-HALF TABLET BY MOUTH EVERY DAY FOR BLOOD PRESSURE 11/26/21   [provider]  amoxicillin-clavulanate (AUGMENTIN) 875-125 MG tablet Take 1 tablet by mouth every 12 (twelve) hours for 6 days. 03/03/22 03/09/22  Fritzi Mandes, MD  aspirin EC 81 MG tablet Take 1 tablet (81 mg total) by mouth daily. 12/01/18   Minna Merritts, MD  atorvastatin (LIPITOR) 40 MG tablet TAKE 1 TABLET BY MOUTH EVERY DAY 10/06/18   Karamalegos, Devonne Doughty, DO  azithromycin (ZITHROMAX) 500 MG tablet Take 1 tablet (500 mg total) by mouth daily for 3 days. 03/04/22 02/24/2022  Fritzi Mandes, MD  Budeson-Glycopyrrol-Formoterol (BREZTRI AEROSPHERE) 160-9-4.8 MCG/ACT AERO Inhale 2 puffs into the lungs in the morning and at bedtime. 04/08/21   Tyler Pita, MD  Cholecalciferol 50 MCG (2000 UT) TABS Take 1 tablet by mouth daily. 08/15/21   [provider]  clopidogrel (PLAVIX) 75 MG tablet Take 1 tablet (75 mg total) by mouth daily for 21 days. 03/04/22 03/25/22  Fritzi Mandes, MD  furosemide (LASIX) 20 MG tablet Take 1 tablet (20 mg total) by mouth daily as needed. 07/30/21   Minna Merritts, MD  ipratropium (ATROVENT) 0.06 % nasal spray Place 2 sprays into both nostrils 4 (four) times daily. 04/08/21   Tyler Pita, MD  ipratropium-albuterol (DUONEB) 0.5-2.5 (3) MG/3ML SOLN USE 1 NEBULIZER SOLUTION (3ML) BY ORAL INHALATION EVERY 6 HOURS AS NEEDED FOR SHORTNESS OF BREATH 09/23/21   [provider]  ketoconazole (NIZORAL) 2 % cream APPLY SUFFICIENT AMOUNT TOPICALLY TWO TIMES A DAY TO RASH ON FOOT. USE FOR 2 WEEKS, THEN DISCONTINUE 11/26/21   [provider]  lisinopril (ZESTRIL) 40 MG tablet TAKE ONE-HALF TABLET BY MOUTH EVERY DAY FOR BLOOD PRESSURE 05/14/21   [provider]  metFORMIN (GLUCOPHAGE) 500 MG tablet Take 1,000 mg by mouth 2 (two) times daily with a meal.    [provider]  metoprolol tartrate (LOPRESSOR) 50 MG tablet TAKE 1  TABLET BY MOUTH TWICE A DAY 10/30/21   Minna Merritts, MD  nitroGLYCERIN (NITROSTAT) 0.4 MG SL tablet Place 1 tablet (0.4 mg total) under the tongue every 5 (five) minutes as needed for chest pain. 10/13/17   Minna Merritts, MD  predniSONE (DELTASONE) 5 MG tablet Take 1 tablet by mouth daily. 11/26/21   [provider]  senna-docusate (SENOKOT-S) 8.6-50 MG tablet Take 1 tablet by mouth 2 (two) times daily. 03/03/22   Fritzi Mandes, MD  tamsulosin (FLOMAX) 0.4 MG CAPS capsule TAKE ONE CAPSULE BY MOUTH EVERY EVENING TO REPLACE TERAZOSIN 11/15/21   [provider]     Critical care time: 50 minutes     Darel Hong, AGACNP-BC [facility] [place] [facility] epic messenger for cross cover needs If after hours, please call E-link

## 2022-03-07 NOTE — Consult Note (Signed)
Pharmacy Antibiotic Note  Stanley Mclean is a 81 y.o. male admitted on 02/16/2022 with pneumonia.  Pharmacy has been consulted for Zosyn dosing.  Plan: Zosyn 3.375g IV q8h (4 hour infusion).  Height: _0  (175.3 cm) Weight: 52.8 kg (116 lb 6.5 oz) IBW/kg (Calculated) : 70.7  Temp (24hrs), Avg:98 F (36.7 C), Min:98 F (36.7 C), Max:98 F (36.7 C)  Recent Labs  Lab 02/28/22 1833 03/01/22 0639 03/01/22 0645 03/11/2022 1529  WBC  --  11.7*  --  10.6*  CREATININE  --   --  0.86 1.24  LATICACIDVEN 1.2  --   --  1.8    Estimated Creatinine Clearance: 36.1 mL/min (by C-G formula based on SCr of 1.24 mg/dL).    No Known Allergies  Antimicrobials this admission: Zosyn 12/22  >>    Microbiology results: 12/22 BCx: pending  Thank you for allowing pharmacy to be a part of this patient's care.  Dorrance Sellick Rodriguez-Guzman PharmD, BCPS 03/02/2022 5:43 PM

## 2022-03-07 NOTE — ED Notes (Signed)
Critical result of CO2 98 & pH 7.23 was called from lab at this time  Provider notified

## 2022-03-07 NOTE — ED Notes (Signed)
Report given to [facility] of ICU at this time.

## 2022-03-07 NOTE — ED Provider Notes (Signed)
[facility] Provider Note    Event Date/Time   First MD Initiated Contact with Patient 03/08/2022 1516     (approximate)   History   Chief Complaint Shortness of Breath   HPI  Stanley Mclean is a 81 y.o. male with past medical history of hypertension, diabetes, CAD, COPD, chronic hypoxic respiratory failure on 4 L nasal cannula, and stroke who presents to the ED for shortness of breath.  History is limited due to patient's respiratory distress, per EMS patient's family called out for difficulty breathing and EMS found patient to have oxygen saturations of 82% on his usual 4 L nasal cannula.  Per EMS, patient had wheezing with very poor air movement, was given 125 mg of Solu-Medrol, 4 g of magnesium, and 3 DuoNeb's.  He was talking in single words and seemed to improve slightly per EMS, but on arrival he is not able to answer any questions.     Physical Exam   Triage Vital Signs: ED Triage Vitals  Enc Vitals Group     BP      Pulse      Resp      Temp      Temp src      SpO2      Weight      Height      Head Circumference      Peak Flow      Pain Score      Pain Loc      Pain Edu?      Excl. in [place]?     Most recent vital signs: Vitals:   02/14/2022 1505 02/28/2022 1513  BP: (!) 188/81 (!) 143/83  Pulse: (!) 135 (!) 163  Resp: 12 14  SpO2: 98% 100%    Constitutional: Somnolent but opens eyes to voice, does not verbally communicate. Eyes: Conjunctivae are normal. Head: Atraumatic. Nose: No congestion/rhinnorhea. Mouth/Throat: Mucous membranes are moist.  Cardiovascular: Tachycardic, regular rhythm. Grossly normal heart sounds.  2+ radial pulses bilaterally. Respiratory: Tachypneic and in severe respiratory distress with accessory muscle use and very poor air movement throughout, faint wheezing noted. Gastrointestinal: Soft and nontender. No distention. Musculoskeletal: No lower extremity tenderness nor edema.  Neurologic:  Normal speech and  language. No gross focal neurologic deficits are appreciated.    ED Results / Procedures / Treatments   Labs (all labs ordered are listed, but only abnormal results are displayed) Labs Reviewed  CBC WITH DIFFERENTIAL/PLATELET - Abnormal; Notable for the following components:      Result Value   WBC 10.6 (*)    RBC 3.71 (*)    Hemoglobin 11.3 (*)    HCT 36.8 (*)    Neutro Abs 8.9 (*)    Abs Immature Granulocytes 0.08 (*)    All other components within normal limits  CULTURE, BLOOD (ROUTINE X 2)  CULTURE, BLOOD (ROUTINE X 2)  RESP PANEL BY RT-PCR (RSV, FLU A&B, COVID)  RVPGX2  PROTIME-INR  BLOOD GAS, VENOUS  COMPREHENSIVE METABOLIC PANEL  LACTIC ACID, PLASMA  LACTIC ACID, PLASMA  PROCALCITONIN  TROPONIN I (HIGH SENSITIVITY)     EKG  ED ECG REPORT I, Blake Divine, the attending physician, personally viewed and interpreted this ECG.   Date: 03/09/2022  EKG Time: 15:27  Rate: 129  Rhythm: sinus tachycardia  Axis: LAD  Intervals:right bundle branch block and left anterior fascicular block  ST&T Change: None  RADIOLOGY Chest x-ray reviewed and interpreted by me with multifocal infiltrates similar  to previous, ET tube appears appropriately positioned.  PROCEDURES:  Critical Care performed: Yes, see critical care procedure note(s)  .Critical Care  Performed by: Blake Divine, MD Authorized by: Blake Divine, MD   Critical care provider statement:    Critical care time (minutes):  30   Critical care time was exclusive of:  Separately billable procedures and treating other patients and teaching time   Critical care was necessary to treat or prevent imminent or life-threatening deterioration of the following conditions:  Respiratory failure   Critical care was time spent personally by me on the following activities:  Development of treatment plan with patient or surrogate, discussions with consultants, evaluation of patient's response to treatment, examination of  patient, ordering and review of laboratory studies, ordering and review of radiographic studies, ordering and performing treatments and interventions, pulse oximetry, re-evaluation of patient's condition and review of old charts   I assumed direction of critical care for this patient from another provider in my specialty: no     Care discussed with: admitting provider      MEDICATIONS ORDERED IN ED: Medications  propofol (DIPRIVAN) 1000 MG/100ML infusion (5 mcg/kg/min  52.8 kg Intravenous New Bag/Given 03/05/2022 1536)  fentaNYL (SUBLIMAZE) injection 25 mcg (25 mcg Intravenous Given 02/19/2022 1536)  fentaNYL (SUBLIMAZE) injection 25-100 mcg (has no administration in time range)  ceFEPIme (MAXIPIME) 2 g in sodium chloride 0.9 % 100 mL IVPB (has no administration in time range)  vancomycin (VANCOCIN) IVPB 1000 mg/200 mL premix (has no administration in time range)  azithromycin (ZITHROMAX) 500 mg in sodium chloride 0.9 % 250 mL IVPB (has no administration in time range)  lactated ringers bolus 1,000 mL (has no administration in time range)  etomidate (AMIDATE) injection 15 mg (15 mg Intravenous Given 03/02/2022 1510)  rocuronium bromide 10 mg/mL (PF) syringe (50 mg Intravenous Given 03/16/2022 1511)  ipratropium-albuterol (DUONEB) 0.5-2.5 (3) MG/3ML nebulizer solution 3 mL (3 mLs Nebulization Given 02/27/2022 1535)     IMPRESSION / MDM / [place] / ED COURSE  I reviewed the triage vital signs and the nursing notes.                              81 y.o. male with past medical history of hypertension, diabetes, CAD, stroke, COPD, and chronic hypoxic respiratory failure on 4 L nasal cannula who presents to the ED in respiratory distress.  Patient's presentation is most consistent with acute presentation with potential threat to life or bodily function.  Differential diagnosis includes, but is not limited to, COPD exacerbation, CHF, pneumonia, sepsis, pleural effusion, ACS, PE.  Patient  ill-appearing and in severe respiratory distress on arrival, tachypneic with very poor air movement despite multiple breathing treatments with EMS.  His mental status seems to be declining, was previously as well to answer questions with EMS, but now somnolent and minimally responsive.  Patient was noted to be full code during recent admission and decision was made to proceed with intubation.  ET tube appears appropriately positioned on chest x-ray, however NG tube appears to be in the right mainstem bronchus.  NG tube was removed and we will replace, will check follow-up x-ray.  X-ray does show evidence of ongoing multifocal pneumonia, previously treated with Unasyn and azithromycin.  We will broaden to cefepime, bank, and azithromycin today.  Patient turned over to oncoming provider pending lab results and admission to the ICU.      FINAL CLINICAL  IMPRESSION(S) / ED DIAGNOSES   Final diagnoses:  Acute on chronic respiratory failure with hypoxia (HCC)  COPD exacerbation (HCC)  Altered mental status, unspecified altered mental status type  Pneumonia of both lungs due to infectious organism, unspecified part of lung     Rx / DC Orders   ED Discharge Orders     None        Note:  This document was prepared using Dragon voice recognition software and may include unintentional dictation errors.   Blake Divine, MD 02/19/2022 [phone]

## 2022-03-07 NOTE — ED Notes (Signed)
NG tube was placed right after intubation, once the x-ray was read by radiologist, the NG tube was in the right lower lobe of the lung. This nurse removed NG tube and attempted twice with two different sizes of OG tube. This nurse was unsuccessful. Provider aware.

## 2022-03-07 NOTE — ED Triage Notes (Signed)
Pt comes by EMS  from home with SOB. Pt was 82% on RA when EMS arrived.   EMS gave 64m of Mag, solumedrol and 3 breathing txt. Pt is not A&Ox4.

## 2022-03-07 NOTE — Consult Note (Signed)
PHARMACY CONSULT NOTE - FOLLOW UP  Pharmacy Consult for Electrolyte Monitoring and Replacement   Recent Labs: Potassium (mmol/L)  Date Value  03/10/2022 4.2  06/07/2013 3.1 (L)   Magnesium (mg/dL)  Date Value  03/01/2022 1.9   Calcium (mg/dL)  Date Value  03/14/2022 9.4   Calcium, Total (mg/dL)  Date Value  06/07/2013 8.5   Albumin (g/dL)  Date Value  02/25/2022 3.7  12/29/2013 4.7  06/01/2013 4.0   Phosphorus (mg/dL)  Date Value  02/28/2022 2.9   Sodium  Date Value  02/19/2022 144 mmol/L  07/07/2017 140  06/07/2013 141 mmol/L     Assessment: Patient with hx of hypertension, diabetes, CAD, COPD, chronic hypoxic respiratory failure on 4 L nasal cannula, and stroke, admitted to critical care services. Pharmacy consulted to manage electrolytes   Goal of Therapy:  Electrolytes = WNL  Plan:  Electrolytes currently within normal limits, no replacement needed Will follow AM labs and replace as needed  Stanley Mclean PharmD, BCPS 03/02/2022 5:41 PM

## 2022-03-07 NOTE — ED Notes (Signed)
Intubation started at 1510  Etomidate 40m given at 1511  Rocuronium 522mgiven at 1511 (after etomidate)  Tube placed at 1512 (size 8.02 Fr., 25 at the lip)

## 2022-03-08 DIAGNOSIS — J9601 Acute respiratory failure with hypoxia: Secondary | ICD-10-CM | POA: Diagnosis not present

## 2022-03-08 DIAGNOSIS — J441 Chronic obstructive pulmonary disease with (acute) exacerbation: Secondary | ICD-10-CM | POA: Diagnosis not present

## 2022-03-08 DIAGNOSIS — J1100 Influenza due to unidentified influenza virus with unspecified type of pneumonia: Secondary | ICD-10-CM | POA: Diagnosis not present

## 2022-03-08 DIAGNOSIS — J189 Pneumonia, unspecified organism: Secondary | ICD-10-CM | POA: Diagnosis not present

## 2022-03-08 LAB — BLOOD GAS, VENOUS
Acid-Base Excess: 7.3 mmol/L — ABNORMAL HIGH (ref 0.0–2.0)
Bicarbonate: 33.6 mmol/L — ABNORMAL HIGH (ref 20.0–28.0)
O2 Saturation: 95 %
Patient temperature: 37
pCO2, Ven: 53 mmHg (ref 44–60)
pH, Ven: 7.41 (ref 7.25–7.43)
pO2, Ven: 69 mmHg — ABNORMAL HIGH (ref 32–45)

## 2022-03-08 LAB — CBC
HCT: 27.9 % — ABNORMAL LOW (ref 39.0–52.0)
Hemoglobin: 8.8 g/dL — ABNORMAL LOW (ref 13.0–17.0)
MCH: 30.7 pg (ref 26.0–34.0)
MCHC: 31.5 g/dL (ref 30.0–36.0)
MCV: 97.2 fL (ref 80.0–100.0)
Platelets: 202 10*3/uL (ref 150–400)
RBC: 2.87 MIL/uL — ABNORMAL LOW (ref 4.22–5.81)
RDW: 13.8 % (ref 11.5–15.5)
WBC: 10.8 10*3/uL — ABNORMAL HIGH (ref 4.0–10.5)
nRBC: 0 % (ref 0.0–0.2)

## 2022-03-08 LAB — RENAL FUNCTION PANEL
Albumin: 2.8 g/dL — ABNORMAL LOW (ref 3.5–5.0)
Anion gap: 9 (ref 5–15)
BUN: 32 mg/dL — ABNORMAL HIGH (ref 8–23)
CO2: 31 mmol/L (ref 22–32)
Calcium: 8.4 mg/dL — ABNORMAL LOW (ref 8.9–10.3)
Chloride: 99 mmol/L (ref 98–111)
Creatinine, Ser: 1.56 mg/dL — ABNORMAL HIGH (ref 0.61–1.24)
GFR, Estimated: 45 mL/min — ABNORMAL LOW (ref >60)
Glucose, Bld: 234 mg/dL — ABNORMAL HIGH (ref 70–99)
Phosphorus: 4.4 mg/dL (ref 2.5–4.6)
Potassium: 4.3 mmol/L (ref 3.5–5.1)
Sodium: 139 mmol/L (ref 135–145)

## 2022-03-08 LAB — GLUCOSE, CAPILLARY
Glucose-Capillary: 191 mg/dL — ABNORMAL HIGH (ref 70–99)
Glucose-Capillary: 207 mg/dL — ABNORMAL HIGH (ref 70–99)
Glucose-Capillary: 214 mg/dL — ABNORMAL HIGH (ref 70–99)
Glucose-Capillary: 231 mg/dL — ABNORMAL HIGH (ref 70–99)
Glucose-Capillary: 263 mg/dL — ABNORMAL HIGH (ref 70–99)
Glucose-Capillary: 264 mg/dL — ABNORMAL HIGH (ref 70–99)

## 2022-03-08 LAB — MAGNESIUM: Magnesium: 2.4 mg/dL (ref 1.7–2.4)

## 2022-03-08 LAB — TRIGLYCERIDES: Triglycerides: 56 mg/dL (ref <150)

## 2022-03-08 LAB — LACTIC ACID, PLASMA
Lactic Acid, Venous: 1.1 mmol/L (ref 0.5–1.9)
Lactic Acid, Venous: 1.2 mmol/L (ref 0.5–1.9)

## 2022-03-08 LAB — PROCALCITONIN: Procalcitonin: 2.55 ng/mL

## 2022-03-08 LAB — MRSA NEXT GEN BY PCR, NASAL: MRSA by PCR Next Gen: NOT DETECTED

## 2022-03-08 MED ORDER — VITAL HIGH PROTEIN PO LIQD
1000.0000 mL | ORAL | Status: DC
  Administered 2022-03-08: 1000 mL

## 2022-03-08 MED ORDER — DOCUSATE SODIUM 50 MG/5ML PO LIQD: 100.0000 mg | Freq: Two times a day (BID) | ORAL | Status: DC | PRN

## 2022-03-08 MED ORDER — PROSOURCE TF20 ENFIT COMPATIBL EN LIQD
60.0000 mL | Freq: Every day | ENTERAL | Status: DC
  Administered 2022-03-08: 60 mL

## 2022-03-08 MED ORDER — LACTATED RINGERS IV BOLUS
250.0000 mL | Freq: Once | INTRAVENOUS | Status: AC
  Administered 2022-03-08: 250 mL via INTRAVENOUS

## 2022-03-08 MED ORDER — SODIUM CHLORIDE 0.9 % IV SOLN: INTRAVENOUS | Status: DC

## 2022-03-08 MED ORDER — OSELTAMIVIR PHOSPHATE 30 MG PO CAPS
30.0000 mg | ORAL_CAPSULE | Freq: Every day | ORAL | Status: DC
  Administered 2022-03-08 – 2022-03-10 (×2): 30 mg
  Filled 2022-03-08 (×4): qty 1

## 2022-03-08 MED ORDER — METHYLPREDNISOLONE SODIUM SUCC 40 MG IJ SOLR
20.0000 mg | Freq: Every day | INTRAMUSCULAR | Status: DC
  Administered 2022-03-09 – 2022-03-12 (×4): 20 mg via INTRAVENOUS
  Filled 2022-03-08 (×4): qty 1

## 2022-03-08 MED ORDER — CHLORHEXIDINE GLUCONATE CLOTH 2 % EX PADS
6.0000 | MEDICATED_PAD | Freq: Every day | CUTANEOUS | Status: DC
  Administered 2022-03-08 – 2022-03-17 (×12): 6 via TOPICAL

## 2022-03-08 MED ORDER — SODIUM CHLORIDE 0.9 % IV SOLN: INTRAVENOUS | Status: DC | PRN

## 2022-03-08 MED ORDER — POLYETHYLENE GLYCOL 3350 17 G PO PACK: 17.0000 g | PACK | Freq: Every day | ORAL | Status: DC | PRN

## 2022-03-08 NOTE — Progress Notes (Signed)
Per Dr. Genia Harold, no low intermittent suction to OG tube.

## 2022-03-08 NOTE — Consult Note (Signed)
Pharmacy Antibiotic Note  Stanley Mclean is a 81 y.o. male admitted on 03/09/2022 with pneumonia. PMH significant for CAD, HTN, COPD, and LUL cavitary lesion. Pharmacy has been consulted for Zosyn dosing.  Plan: Day 2 of antibiotics Continue Zosyn 3.375 g IV Q8H Continue to monitor renal function and follow culture results   Height: _0  (175.3 cm) Weight: 53.6 kg (118 lb 2.7 oz) IBW/kg (Calculated) : 70.7  Temp (24hrs), Avg:98.7 F (37.1 C), Min:98 F (36.7 C), Max:99 F (37.2 C)  Recent Labs  Lab 03/15/2022 1529 02/19/2022 2212 03/08/22 0445 03/08/22 0704  WBC 10.6*  --  10.8*  --   CREATININE 1.24  --  1.56*  --   LATICACIDVEN 1.8 2.6*  --  1.1     Estimated Creatinine Clearance: 29.1 mL/min (A) (by C-G formula based on SCr of 1.56 mg/dL (H)).    No Known Allergies  Antimicrobials this admission: 12/22 Zosyn  >>   Microbiology results: 12/22 BCx: IP 12/22 MRSA PCR: negative 12/22 Strep pneumo Ag: negative 12/22 Resp Cx: ordered  Thank you for allowing pharmacy to be a part of this patient's care.  Raquel Rodriguez-Guzman PharmD, BCPS 03/08/2022 7:43 AM

## 2022-03-08 NOTE — Progress Notes (Signed)
NAME:  Stanley Mclean, MRN:  [id number], DOB:  23-Oct-1940, LOS: 1 ADMISSION DATE:  03/03/2022, CHIEF COMPLAINT:  Respiratory Failure   History of Present Illness:   Stanley Mclean is a 81 year old male with a past medical history significant for COPD requiring 4 L supplemental oxygen at baseline, CAD, hypertension, diabetes mellitus, stroke who presents to [facility] ED on 02/17/2022 due to shortness of breath.  Patient currently is intubated and currently no family is available to contribute to history, therefore history obtained from chart review. He is admitted for the management of Severe Sepsis and acute on chronic hypoxic and hypercapnic respiratory failure in the setting of AECOPD due to influenza A infection and multifocal pneumonia requiring intubation and mechanical ventilation.   Per ED and nursing notes, the patient's family contacted EMS due to shortness of breath. Upon EMS arrival he was noted to be hypoxic with O2 saturations of 82% on his baseline 4 L nasal cannula.  EMS noted severe wheezing with poor air movement of which she was given 125 mg Solu-Medrol, 4 g of magnesium, and 3 DuoNeb's.   Upon arrival to the ED he continued to have respiratory distress, only able to talk in single words.   Of note he was recently admitted at [facility] from 02/28/2022 through 03/03/2022 for acute metabolic encephalopathy in the setting of acute small left PCA territory stroke, multifocal pneumonia, and cavitary lesion concerning for neoplasm versus infection.  He was discharged on Augmentin and azithromycin and was to follow-up with pulmonologist Dr. Lanney Gins.  ED Course: Initial vital signs: Temperature 98, respiratory rate 23, pulse 125, blood pressure 96/65 Significant Labs: Bicarb 34, glucose 188, BUN 22, creatinine 1.24, AST 45, high-sensitivity troponin 34, lactic acid 1.8, procalcitonin 0.11, WBC 10.6, hemoglobin 11.3, hematocrit 36.8 Positive for influenza A Urinalysis negative for UTI  While in  the ED his mental status worsened which he became minimally responsive and somnolent which he was subsequently intubated by the ED provider.    Significant Hospital Events: Including procedures, antibiotic start and stop dates in addition to other pertinent events   12/22: Presented to ED, required intubation in the ED.  PCCM asked to admit 12/23: remains intubated, comfortable  Interim History / Subjective:  Unable to obtain history. Patient intubated and sedated  Objective   Blood pressure (!) 115/55, pulse 79, temperature 98.6 F (37 C), resp. rate 12, height _0  (1.753 m), weight 53.6 kg, SpO2 98 %.    Vent Mode: PRVC FiO2 (%):  [28 %-40 %] 28 % Set Rate:  [18 bmp-22 bmp] 22 bmp Vt Set:  [500 mL] 500 mL PEEP:  [5 cmH20] 5 cmH20 Plateau Pressure:  [15 cmH20] 15 cmH20   Intake/Output Summary (Last 24 hours) at 03/08/2022 0842 Last data filed at 03/08/2022 0700 Gross per 24 hour  Intake 2996.8 ml  Output 675 ml  Net 2321.8 ml   Filed Weights   02/27/2022 1508 03/08/22 0447  Weight: 52.8 kg 53.6 kg    Examination: Physical Exam Constitutional:      General: He is not in acute distress.    Appearance: He is not ill-appearing.  HENT:     Head: Normocephalic.     Mouth/Throat:     Comments: ETT in place Cardiovascular:     Rate and Rhythm: Normal rate and regular rhythm.     Pulses: Normal pulses.     Heart sounds: Normal heart sounds.  Pulmonary:     Effort: No respiratory distress.  Breath sounds: Normal breath sounds.     Comments: Ventilated breath sounds anteriorly Abdominal:     Palpations: Abdomen is soft.  Skin:    General: Skin is warm.  Neurological:     Comments: Sedated. Opens eyes to painful stimuli      Assessment & Plan:   81 year old male with history of CAD, HTN, COPD, and LUL cavitary lesion presents to the hospital with shortness of breath found to have acute on chronic hypoxic and hypercapnic respiratory failure secondary to influenza  A infection.   Neurology #Toxic Metabolic Encephalopathy #CO2 Narcosis   Secondary to COPD exacerbation. Intubated, management per pulmonary section. Propofol and fentanyl for analgo-sedation goal RASS 0 to -1. Repeat blood gas with improvement  -daily sedation holiday -goal RASS of 0 to -1   Cardiovascular #HTN   Holding home anti-hypertensive agents while sedated. History of cerebrovascular disease and CAD, will continue home aspirin and plavix. Sedation related hypotension treated with an infusion of nor-epinephrine.   Pulmonary #Acute on Chronic Hypoxic and Hypercapnic Respiratory Failure #Acute Exacerbation of COPD #Influenza A Infection #Left Upper Lobe Cavitary Lesion   Background emphysema and COPD, on triple therapy at home. Now with hypercapnic respiratory failure requiring intubation. Will attempt to match his minute ventilation and titrate settings to blood gas, goal pH of 7.3 to 7.35. Will treat COPD exacerbation with short course of steroids and standing nebulizers. Repeat CT doesn't show any dense consolidations to suggest superimposed bacterial pneumonia. Cavitary lesion in the LUL concerning for malignancy based with progression from prior imaging, stable compared to last week.   -VBG stat -anti-microbial per ID section -standing duo-nebs -pulmonary toilet with hypertonic saline -solumedrol 40 mg today, decrease to 20 mg daily tomorrow   Gastrointestinal Protonix for PUD prophylaxis. Tube feeds today.   Renal #AKI   Volume resuscitated in the ED. Will avoid nephrotoxic medications and monitor.   Endocrine #DMII   ICU glycemic protocol   Hem/Onc Heparin subQ for DVT prophylaxis   ID #Influenza A Pneumonia   Started oseltamivir for influenza. Will also administer zosyn given his cavitary lesion was being treated with Augmentin, thou don't suspect this has progressed. Was previously on chronic steroids, will obtain broad infectious workup (respiratory  cultures, BDG, aspergillus galactomannan, cryptococcal antigen).    Best Practice (right click and "Reselect all SmartList Selections" daily)   Diet/type: tubefeeds DVT prophylaxis: prophylactic heparin  GI prophylaxis: PPI Lines: N/A Foley:  Yes, and it is still needed Code Status:  full code Last date of multidisciplinary goals of care discussion [03/08/2022]  Labs   CBC: Recent Labs  Lab 03/12/2022 1529 03/08/22 0445  WBC 10.6* 10.8*  NEUTROABS 8.9*  --   HGB 11.3* 8.8*  HCT 36.8* 27.9*  MCV 99.2 97.2  PLT 231 945    Basic Metabolic Panel: Recent Labs  Lab 02/20/2022 1529 03/08/22 0445 03/08/22 0704  NA 144 139  --   K 4.2 4.3  --   CL 99 99  --   CO2 34* 31  --   GLUCOSE 188* 234*  --   BUN 22 32*  --   CREATININE 1.24 1.56*  --   CALCIUM 9.4 8.4*  --   MG  --   --  2.4  PHOS  --  4.4  --    GFR: Estimated Creatinine Clearance: 29.1 mL/min (A) (by C-G formula based on SCr of 1.56 mg/dL (H)). Recent Labs  Lab 03/03/22 1331 02/22/2022 1529 03/04/2022 2212 03/08/22  0445 03/08/22 0704  PROCALCITON 0.11 0.11  --  2.55  --   WBC  --  10.6*  --  10.8*  --   LATICACIDVEN  --  1.8 2.6*  --  1.1    Liver Function Tests: Recent Labs  Lab 02/16/2022 1529 03/08/22 0445  AST 45*  --   ALT 39  --   ALKPHOS 96  --   BILITOT 0.9  --   PROT 7.5  --   ALBUMIN 3.7 2.8*   No results for input(s): "LIPASE", "AMYLASE" in the last 168 hours. No results for input(s): "AMMONIA" in the last 168 hours.  ABG    Component Value Date/Time   PHART 7.36 03/11/2022 2052   PCO2ART 61 (H) 03/09/2022 2052   PO2ART 114 (H) 03/05/2022 2052   HCO3 34.5 (H) 02/21/2022 2052   O2SAT 99.6 03/12/2022 2052     Coagulation Profile: Recent Labs  Lab 03/10/2022 1529  INR 1.0    Cardiac Enzymes: No results for input(s): "CKTOTAL", "CKMB", "CKMBINDEX", "TROPONINI" in the last 168 hours.  HbA1C: Hemoglobin A1C  Date/Time Value Ref Range Status  07/07/2017 12:00 AM 8.2  Final   06/18/2016 03:18 PM 7.9  Final   Hgb A1c MFr Bld  Date/Time Value Ref Range Status  03/01/2022 06:45 AM 9.0 (H) 4.8 - 5.6 % Final    Comment:    (NOTE)         Prediabetes: 5.7 - 6.4         Diabetes: >6.4         Glycemic control for adults with diabetes: <7.0   02/23/2021 12:16 AM 7.4 (H) 4.8 - 5.6 % Final    Comment:    (NOTE)         Prediabetes: 5.7 - 6.4         Diabetes: >6.4         Glycemic control for adults with diabetes: <7.0     CBG: Recent Labs  Lab 02/28/2022 1817 03/08/2022 1936 03/12/2022 2315 03/08/22 0347 03/08/22 0726  GLUCAP 231* 253* 180* 207* 191*    Review of Systems:   Unable to obtain  Past Medical History:  He,  has a past medical history of Anemia, Basal cell carcinoma, BPH (benign prostatic hyperplasia), CAD (coronary artery disease), COPD [place]chronic obstructive pulmonary disease[place] ([place]), GERD (gastroesophageal reflux disease), Macular degeneration, Pure hypercholesterolemia, Stroke ([place]), Tobacco abuse, Traumatic amputation of leg(s) (complete) (partial), unilateral, level not specified, without mention of complication (90/30/1499), and Unspecified essential hypertension.   Surgical History:   Past Surgical History:  Procedure Laterality Date   AMPUTATION Right 08/26/2011   Below Knee Amputee   APPENDECTOMY     CARDIAC CATHETERIZATION     CAROTID ANGIOGRAPHY Right 05/14/2016   Procedure: Carotid Angiography;  Surgeon: Katha Cabal, MD;  Location: [place] [facility];  Service: Cardiovascular;  Laterality: Right;   CAROTID PTA/STENT INTERVENTION  05/14/2016   Procedure: Carotid PTA/Stent Intervention;  Surgeon: Katha Cabal, MD;  Location: [place] [facility];  Service: Cardiovascular;;   CORONARY ANGIOPLASTY WITH STENT PLACEMENT  09/28/2006   RCA   CORONARY ANGIOPLASTY WITH STENT PLACEMENT  07/21/2006   Mid LAD   CORONARY ANGIOPLASTY WITH STENT PLACEMENT  06/06/2013   30% prox LAD at the site of a prior stent, 40% mid LAD at the  origin of D1, 40% ostial D1, 30% prox LCx at the site of a prior stent, in the proximal third of the vessel segment, 20%  prox RCA, 99% distal RCA s/p DES. EF >55%.     Social History:   reports that he has been smoking cigarettes. He has a 60.00 pack-year smoking history. He has never used smokeless tobacco. He reports that he does not drink alcohol and does not use drugs.   Family History:  His family history includes Mental illness in his mother.   Allergies No Known Allergies   Home Medications  Prior to Admission medications   Medication Sig Start Date End Date Taking? Authorizing Provider  amLODipine (NORVASC) 10 MG tablet Take 5 mg by mouth daily. 11/26/21  Yes [provider]  amoxicillin-clavulanate (AUGMENTIN) 875-125 MG tablet Take 1 tablet by mouth every 12 (twelve) hours for 6 days. 03/03/22 03/09/22 Yes Fritzi Mandes, MD  atorvastatin (LIPITOR) 40 MG tablet TAKE 1 TABLET BY MOUTH EVERY DAY 10/06/18  Yes Karamalegos, Devonne Doughty, DO  Budeson-Glycopyrrol-Formoterol (BREZTRI AEROSPHERE) 160-9-4.8 MCG/ACT AERO Inhale 2 puffs into the lungs in the morning and at bedtime. 04/08/21  Yes Tyler Pita, MD  Cholecalciferol 50 MCG (2000 UT) TABS Take 1 tablet by mouth daily. 08/15/21  Yes [provider]  clopidogrel (PLAVIX) 75 MG tablet Take 1 tablet (75 mg total) by mouth daily for 21 days. 03/04/22 03/25/22 Yes Fritzi Mandes, MD  empagliflozin (JARDIANCE) 25 MG TABS tablet Take 15-25 mg by mouth daily. 02/14/22  Yes [provider]  Fluticasone-Umeclidin-Vilant (TRELEGY ELLIPTA) 100-62.5-25 MCG/ACT AEPB Inhale 1 puff into the lungs daily. 03/06/22  Yes [provider]  lisinopril (ZESTRIL) 40 MG tablet Take 20 mg by mouth daily. 05/14/21  Yes [provider]  metFORMIN (GLUCOPHAGE) 500 MG tablet Take 1,000 mg by mouth 2 (two) times daily with a meal.   Yes [provider]  metoprolol tartrate (LOPRESSOR) 50 MG tablet TAKE 1 TABLET BY MOUTH  TWICE A DAY 10/30/21  Yes Gollan, Kathlene November, MD  predniSONE (DELTASONE) 5 MG tablet Take 1 tablet by mouth daily. 11/26/21  Yes [provider]  senna-docusate (SENOKOT-S) 8.6-50 MG tablet Take 1 tablet by mouth 2 (two) times daily. 03/03/22  Yes Fritzi Mandes, MD  sulfamethoxazole-trimethoprim (BACTRIM) 400-80 MG tablet Take 1 tablet by mouth 2 (two) times daily. 03/06/22 04/14/22 Yes [provider]  tamsulosin (FLOMAX) 0.4 MG CAPS capsule Take 0.4 mg by mouth daily. 11/15/21  Yes [provider]  triamcinolone cream (KENALOG) 0.1 % Apply 1 Application topically 2 (two) times daily as needed (Inflammation on scalp). 09/23/21  Yes [provider]  albuterol (VENTOLIN HFA) 108 (90 Base) MCG/ACT inhaler Inhale 2 puffs into the lungs every 6 (six) hours as needed for wheezing or shortness of breath. 03/03/22 06/01/22  Fritzi Mandes, MD  aspirin EC 81 MG tablet Take 1 tablet (81 mg total) by mouth daily. 12/01/18   Minna Merritts, MD  furosemide (LASIX) 20 MG tablet Take 1 tablet (20 mg total) by mouth daily as needed. 07/30/21   Minna Merritts, MD  ipratropium (ATROVENT) 0.06 % nasal spray Place 2 sprays into both nostrils 4 (four) times daily. 04/08/21   Tyler Pita, MD  ipratropium-albuterol (DUONEB) 0.5-2.5 (3) MG/3ML SOLN USE 1 NEBULIZER SOLUTION (3ML) BY ORAL INHALATION EVERY 6 HOURS AS NEEDED FOR SHORTNESS OF BREATH 09/23/21   [provider]  ketoconazole (NIZORAL) 2 % cream APPLY SUFFICIENT AMOUNT TOPICALLY TWO TIMES A DAY TO RASH ON FOOT. USE FOR 2 WEEKS, THEN DISCONTINUE Patient not taking: Reported on 03/12/2022 11/26/21   [provider]  nitroGLYCERIN (  NITROSTAT) 0.4 MG SL tablet Place 1 tablet (0.4 mg total) under the tongue every 5 (five) minutes as needed for chest pain. 10/13/17   Minna Merritts, MD     Critical care time: 57 minutes    Armando Reichert, MD [facility]

## 2022-03-08 NOTE — Consult Note (Signed)
PHARMACY NOTE:  ANTIMICROBIAL RENAL DOSAGE ADJUSTMENT  Current antimicrobial regimen includes a mismatch between antimicrobial dosage and estimated renal function.  As per policy approved by the Pharmacy & Therapeutics and Medical Executive Committees, the antimicrobial dosage will be adjusted accordingly.  Current antimicrobial dosage: Oseltamivir 30 mg per tube BID  Indication: Flu  Renal Function: Estimated Creatinine Clearance: 29.1 mL/min (A) (by C-G formula based on SCr of 1.56 mg/dL (H)).    Antimicrobial dosage has been changed to:  Oseltamivir 30 mg per tube daily  Thank you for allowing pharmacy to be a part of this patient's care.  Gretel Acre, PharmD PGY1 Pharmacy Resident 03/08/2022 7:40 AM

## 2022-03-08 NOTE — Consult Note (Signed)
PHARMACY CONSULT NOTE - ELECTROLYTES  Pharmacy Consult for Electrolyte Monitoring and Replacement   Recent Labs: Potassium (mmol/L)  Date Value  03/08/2022 4.3  06/07/2013 3.1 (L)   Magnesium (mg/dL)  Date Value  03/08/2022 2.4   Calcium (mg/dL)  Date Value  03/08/2022 8.4 (L)   Calcium, Total (mg/dL)  Date Value  06/07/2013 8.5   Albumin (g/dL)  Date Value  03/08/2022 2.8 (L)  12/29/2013 4.7  06/01/2013 4.0   Phosphorus (mg/dL)  Date Value  03/08/2022 4.4   Sodium  Date Value  03/08/2022 139 mmol/L  07/07/2017 140  06/07/2013 141 mmol/L   Corrected Ca: 9.4 mg/dL  Assessment  Stanley Mclean is a 81 y.o. male presenting with pneumonia. PMH significant for CAD, HTN, COPD, and LUL cavitary lesion. Pharmacy has been consulted to monitor and replace electrolytes.  Diet: NPO MIVF: NS @ 100 mL/hr  Goal of Therapy: Electrolytes WNL  Plan:  Potassium: 4.2 >> 4.3, no replacement needed Magnesium: 2.4, no replacement needed  Phosphorus: 4.4, no replacement needed  Check renal function panel with AM labs Pharmacy will sign off and continue to monitor peripherally  Thank you for allowing pharmacy to be a part of this patient's care.  Gretel Acre, PharmD PGY1 Pharmacy Resident 03/08/2022 7:52 AM

## 2022-03-08 NOTE — Progress Notes (Signed)
Patient continues to have low urine output at 140m so far for this shift; Dr. DGenia Haroldnotified and ordered 2537mbolus of LR.

## 2022-03-08 NOTE — Plan of Care (Signed)
Continuing with plan of care. 

## 2022-03-09 DIAGNOSIS — J189 Pneumonia, unspecified organism: Secondary | ICD-10-CM | POA: Diagnosis not present

## 2022-03-09 DIAGNOSIS — R4182 Altered mental status, unspecified: Secondary | ICD-10-CM | POA: Diagnosis not present

## 2022-03-09 DIAGNOSIS — J9601 Acute respiratory failure with hypoxia: Secondary | ICD-10-CM | POA: Diagnosis not present

## 2022-03-09 DIAGNOSIS — J441 Chronic obstructive pulmonary disease with (acute) exacerbation: Secondary | ICD-10-CM | POA: Diagnosis not present

## 2022-03-09 LAB — RENAL FUNCTION PANEL
Albumin: 2.7 g/dL — ABNORMAL LOW (ref 3.5–5.0)
Anion gap: 9 (ref 5–15)
BUN: 48 mg/dL — ABNORMAL HIGH (ref 8–23)
CO2: 30 mmol/L (ref 22–32)
Calcium: 8.4 mg/dL — ABNORMAL LOW (ref 8.9–10.3)
Chloride: 102 mmol/L (ref 98–111)
Creatinine, Ser: 2.12 mg/dL — ABNORMAL HIGH (ref 0.61–1.24)
GFR, Estimated: 31 mL/min — ABNORMAL LOW (ref >60)
Glucose, Bld: 202 mg/dL — ABNORMAL HIGH (ref 70–99)
Phosphorus: 4.6 mg/dL (ref 2.5–4.6)
Potassium: 4 mmol/L (ref 3.5–5.1)
Sodium: 141 mmol/L (ref 135–145)

## 2022-03-09 LAB — CBC
HCT: 30 % — ABNORMAL LOW (ref 39.0–52.0)
Hemoglobin: 9.5 g/dL — ABNORMAL LOW (ref 13.0–17.0)
MCH: 30.6 pg (ref 26.0–34.0)
MCHC: 31.7 g/dL (ref 30.0–36.0)
MCV: 96.8 fL (ref 80.0–100.0)
Platelets: 251 10*3/uL (ref 150–400)
RBC: 3.1 MIL/uL — ABNORMAL LOW (ref 4.22–5.81)
RDW: 14.5 % (ref 11.5–15.5)
WBC: 14.2 10*3/uL — ABNORMAL HIGH (ref 4.0–10.5)
nRBC: 0.1 % (ref 0.0–0.2)

## 2022-03-09 LAB — GLUCOSE, CAPILLARY
Glucose-Capillary: 111 mg/dL — ABNORMAL HIGH (ref 70–99)
Glucose-Capillary: 185 mg/dL — ABNORMAL HIGH (ref 70–99)
Glucose-Capillary: 188 mg/dL — ABNORMAL HIGH (ref 70–99)
Glucose-Capillary: 197 mg/dL — ABNORMAL HIGH (ref 70–99)
Glucose-Capillary: 83 mg/dL (ref 70–99)

## 2022-03-09 LAB — MAGNESIUM: Magnesium: 2.7 mg/dL — ABNORMAL HIGH (ref 1.7–2.4)

## 2022-03-09 LAB — PROCALCITONIN: Procalcitonin: 2.71 ng/mL

## 2022-03-09 MED ORDER — BUDESONIDE 0.5 MG/2ML IN SUSP
0.5000 mg | RESPIRATORY_TRACT | Status: AC
  Administered 2022-03-09: 0.5 mg via RESPIRATORY_TRACT
  Filled 2022-03-09: qty 2

## 2022-03-09 MED ORDER — HYDRALAZINE HCL 20 MG/ML IJ SOLN
10.0000 mg | INTRAMUSCULAR | Status: DC | PRN
  Administered 2022-03-09: 20 mg via INTRAVENOUS
  Administered 2022-03-10 – 2022-03-11 (×3): 10 mg via INTRAVENOUS
  Administered 2022-03-11 – 2022-03-12 (×2): 20 mg via INTRAVENOUS
  Administered 2022-03-12: 10 mg via INTRAVENOUS
  Administered 2022-03-12: 20 mg via INTRAVENOUS
  Filled 2022-03-09 (×6): qty 1

## 2022-03-09 MED ORDER — SODIUM CHLORIDE 0.9 % IV SOLN
2.0000 g | INTRAVENOUS | Status: AC
  Administered 2022-03-09 – 2022-03-11 (×3): 2 g via INTRAVENOUS
  Filled 2022-03-09 (×2): qty 20
  Filled 2022-03-09: qty 2

## 2022-03-09 MED ORDER — AMLODIPINE BESYLATE 5 MG PO TABS: 5.0000 mg | ORAL_TABLET | Freq: Every day | ORAL | Status: DC

## 2022-03-09 MED ORDER — METHYLPREDNISOLONE SODIUM SUCC 40 MG IJ SOLR
40.0000 mg | INTRAMUSCULAR | Status: AC
  Administered 2022-03-09: 40 mg via INTRAVENOUS
  Filled 2022-03-09: qty 1

## 2022-03-09 MED ORDER — DEXMEDETOMIDINE HCL IN NACL 400 MCG/100ML IV SOLN
INTRAVENOUS | Status: AC
  Filled 2022-03-09: qty 100

## 2022-03-09 MED ORDER — DEXMEDETOMIDINE HCL IN NACL 400 MCG/100ML IV SOLN
0.4000 ug/kg/h | INTRAVENOUS | Status: DC
  Administered 2022-03-09: 0.4 ug/kg/h via INTRAVENOUS

## 2022-03-09 MED ORDER — DEXMEDETOMIDINE HCL IN NACL 400 MCG/100ML IV SOLN
0.4000 ug/kg/h | INTRAVENOUS | Status: DC
  Administered 2022-03-09: 0.4 ug/kg/h via INTRAVENOUS
  Administered 2022-03-10: 0.6 ug/kg/h via INTRAVENOUS
  Administered 2022-03-10: 0.8 ug/kg/h via INTRAVENOUS
  Administered 2022-03-11: 0.597 ug/kg/h via INTRAVENOUS
  Filled 2022-03-09 (×4): qty 100

## 2022-03-09 MED ORDER — VITAL AF 1.2 CAL PO LIQD: 1000.0000 mL | ORAL | Status: DC

## 2022-03-09 MED ORDER — METOPROLOL TARTRATE 5 MG/5ML IV SOLN
2.5000 mg | Freq: Four times a day (QID) | INTRAVENOUS | Status: DC | PRN
  Administered 2022-03-10 – 2022-03-11 (×3): 2.5 mg via INTRAVENOUS
  Administered 2022-03-12 – 2022-03-13 (×5): 5 mg via INTRAVENOUS
  Filled 2022-03-09 (×8): qty 5

## 2022-03-09 MED ORDER — FREE WATER: 100.0000 mL | Status: DC

## 2022-03-09 MED ORDER — METOPROLOL TARTRATE 5 MG/5ML IV SOLN
2.5000 mg | Freq: Four times a day (QID) | INTRAVENOUS | Status: DC | PRN
  Administered 2022-03-09: 2.5 mg via INTRAVENOUS
  Filled 2022-03-09: qty 5

## 2022-03-09 NOTE — Progress Notes (Signed)
NAME:  Stanley Mclean, MRN:  [id number], DOB:  July 12, 1940, LOS: 2 ADMISSION DATE:  02/22/2022, CONSULTATION DATE:  03/09/2022 REFERRING MD:  Dr. Cherylann Banas, CHIEF COMPLAINT: Shortness of breath  Brief Pt Description / Synopsis:  81 year old male admitted with Severe Sepsis and acute on chronic hypoxic and hypercapnic respiratory failure in the setting of AECOPD due to influenza A infection, and LUL Cavitary lesion requiring intubation and mechanical ventilation.  History of Present Illness:  Stanley Mclean is a 81 year old male with a past medical history significant for COPD requiring 4 L supplemental oxygen at baseline, CAD, hypertension, diabetes mellitus, stroke who presents to [facility] ED on 02/27/2022 due to shortness of breath.  Patient currently is intubated and currently no family is available to contribute to history, therefore history obtained from chart review.  Per ED and nursing notes, the patient's family contacted EMS due to shortness of breath.  Upon EMS arrival he was noted to be hypoxic with O2 saturations of 82% on his baseline 4 L nasal cannula.  EMS noted severe wheezing with poor air movement of which she was given 125 mg Solu-Medrol, 4 g of magnesium, and 3 DuoNeb's.  Upon arrival to the ED he continued to have respiratory distress, only able to talk in single words.  Of note he was recently admitted at [facility] from 02/28/2022 through 03/03/2022 for acute metabolic encephalopathy in the setting of acute small left PCA territory stroke, multifocal pneumonia, and cavitary lesion concerning for neoplasm versus infection.  He was discharged on Augmentin and azithromycin and was to follow-up with pulmonologist Dr. Lanney Gins.  ED Course: Initial vital signs: Temperature 98, respiratory rate 23, pulse 125, blood pressure 96/65 Significant Labs: Bicarb 34, glucose 188, BUN 22, creatinine 1.24, AST 45, high-sensitivity troponin 34, lactic acid 1.8, procalcitonin 0.11, WBC 10.6, hemoglobin  11.3, hematocrit 36.8 Positive for influenza A Urinalysis negative for UTI Imaging Chest X-ray>>FINDINGS: The heart size and mediastinal contours are within normal limits. Nasogastric tube is seen in right mainstem bronchus. Endotracheal tube is in good position. Bilateral lung opacities are again noted concerning for multifocal pneumonia. The visualized skeletal structures are unremarkable CT Chest w/o Contrast>>IMPRESSION: 1. Extensive bilateral heterogeneous ground-glass and consolidative airspace opacity throughout the lungs is slightly worsened compared to prior examination. Findings are consistent with worsened multifocal infection. 2. No significant change in a serpiginous, thick-walled cavitary lesion about the peripheral left lung, the largest axial component at the posterolateral left upper lobe measuring 4.4 x 2.5 cm. 3. Endotracheal intubation. 4. Severe emphysema and diffuse bilateral bronchial wall thickening. 5. Frothy debris in the lower trachea and left mainstem bronchus. 6. Coronary artery disease. 7. Nonobstructive right nephrolithiasis. Medications Administered: 1 L normal saline bolus, IV cefepime, vancomycin, azithromycin  While in the ED his mental status worsened which he became minimally responsive and somnolent which he was subsequently intubated by the ED provider.  PCCM is asked to admit the patient for further workup and treatment.  Please see "significant hospital events" section below for full detailed hospital course.  Pertinent  Medical History   Past Medical History:  Diagnosis Date   Anemia    Basal cell carcinoma    BPH (benign prostatic hyperplasia)    CAD (coronary artery disease)    a. s/p multiple prior PCIs b. s/p DES-distal RCA 05/2013   COPD (chronic obstructive pulmonary disease) (HCC)    GERD (gastroesophageal reflux disease)    Macular degeneration    Pure hypercholesterolemia    Stroke ([place])  Tobacco abuse    Traumatic  amputation of leg(s) (complete) (partial), unilateral, level not specified, without mention of complication 62/83/6629   Right Below Knee Amputation   Unspecified essential hypertension      Micro Data:  12/22: SARS-CoV-2 PCR>> negative 12/22: + Influenza A 12/22: Blood culture>>NGTD 12/22: Strep pneumo urinary antigen>>negative 12/22: Legionella urinary antigen>> 12/22: Mycoplasma pneumoniae>> 12/22: MRSA PCR>>negative 12/22: Tracheal aspirate>>not collected 12/22: Fungitell>> 12/23: Cryptococcus Antigen>> 12/23: Aspergillus>>  Antimicrobials:  Cefepime 12/22 x 1 dose Azithromycin 12/22>> Zosyn 12/22>>  Significant Hospital Events: Including procedures, antibiotic start and stop dates in addition to other pertinent events   12/22: Presented to ED, required intubation in the ED.  PCCM asked to admit 12/23: remains intubated, comfortable  12/24: On minimal vent settings. Tolerating SBT ~ EXTUBATE to BiPAP.  Interim History / Subjective:  -No significant events noted overnight -Afebrile, hemodynamically stable, on 2 mcg Levophed -On minimal vent settings ~ Tolerating SBT 5/5 ~ discussed with Dr. Genia Harold, will extubate to BiPAP initially given hx of hypercapnia -Creatinine worsened to 2.12 from 1.56, however UOP improved to 1.1L last 24 hrs (net + 3.4L), electrolytes acceptable ~ will continue to monitor  Objective   Blood pressure 94/60, pulse 78, temperature 98.6 F (37 C), resp. rate 19, height _0  (1.753 m), weight 53.6 kg, SpO2 98 %.    Vent Mode: PRVC FiO2 (%):  [28 %-40 %] 28 % Set Rate:  [22 bmp] 22 bmp Vt Set:  [500 mL] 500 mL PEEP:  [5 cmH20] 5 cmH20 Plateau Pressure:  [14 cmH20] 14 cmH20   Intake/Output Summary (Last 24 hours) at 03/09/2022 4765 Last data filed at 03/09/2022 0700 Gross per 24 hour  Intake 2265.41 ml  Output 1155 ml  Net 1110.41 ml   Filed Weights   03/09/2022 1508 03/08/22 0447 03/09/22 0339  Weight: 52.8 kg 53.6 kg 53.6 kg     Examination: General: Acute on chronically ill-appearing male, laying in bed, intubated, on Precedex awake and following commands, no acute distress HENT: Atraumatic, normocephalic, neck supple, no JVD, orally intubated Lungs: Coarse breath sounds bilaterally, no wheezing noted, even, overbreathes the vent Cardiovascular: Regular rate and rhythm, S1-S2, no murmurs, rubs, gallops Abdomen: Soft, nontender, nondistended, no guarding rebound tenderness, bowel sounds positive x 4 Extremities: Normal bulk and tone, no deformities, no edema Neuro: Lightly Sedated, awake and alert, following commands, no focal deficits, pupils PERRLA GU: Foley catheter draining yellow urine  Resolved Hospital Problem list     Assessment & Plan:   Acute on chronic Hypoxic & Hypercapnic Respiratory Failure in setting of AECOPD due to influenza A infection  LUL Cavitary Lesion -Full vent support, implement lung protective strategies -Plateau pressures less than 30 cm H20 -Wean FiO2 & PEEP as tolerated to maintain O2 sats >92% -Follow intermittent Chest X-ray & ABG as needed -Spontaneous Breathing Trials when respiratory parameters met and mental status permits -Implement VAP Bundle -Bronchodilators and Pulmicort nebs -IV Solu-Medrol -Antibiotics as above  Severe Sepsis due to Influenza A infection  (Met SIRS Criteria upon admission: RR >20, HR 100) -Monitor fever curve -Trend WBC's & Procalcitonin -Follow cultures as above -Continue empiric azithromycin, Zosyn, and Tamiflu pending cultures & sensitivities  Hypotension: suspect sedation related vs sepsis Mildly elevated troponin, suspect demand ischemia PMHx: HTN, HLD, CAD s/p stent Echocardiogram 03/03/2022: LVEF 55 to 46%, grade 2 diastolic dysfunction, RV systolic function normal -Continuous cardiac monitoring -Maintain MAP >65 -Vasopressors as needed to maintain MAP goal -Lactic acid has normalized -Trend  HS Troponin until peaked (34 ~ 48  ~) -Diuresis as BP and renal function permits ~ holding due to AKI and low dose pressors  Acute Kidney Injury -Monitor I&O's / urinary output -Follow BMP -Ensure adequate renal perfusion -Avoid nephrotoxic agents as able -Replace electrolytes as indicated -Low threshold for Nephrology consultation  Anemia of chronic disease -Monitor for S/Sx of bleeding -Trend CBC -Heparin subcu for VTE Prophylaxis  -Transfuse for Hgb <7  Diabetes Mellitus Type II -CBG's q4h; Target range of 140 to 180 -SSI -Follow ICU Hypo/Hyperglycemia protocol  Acute metabolic encephalopathy, suspect CO2 narcosis Sedation needs in the setting of mechanical ventilation PMHx: small left PCA territory stroke  -Treatment of hypercapnia and metabolic derangements as outlined above -Maintain a RASS goal of 0 to -1 -Propofol as needed to maintain RASS goal -Avoid sedating medications as able -Daily wake up assessment -Aspirin and Plavix      Patient is critically ill with multiple chronic comorbidities.  Prognosis is guarded.  Recommend DNR status.   Best Practice (right click and "Reselect all SmartList Selections" daily)   Diet/type: NPO, tube feeds DVT prophylaxis: prophylactic heparin  GI prophylaxis: PPI Lines: N/A Foley:  Yes, and it is still needed Code Status:  full code Last date of multidisciplinary goals of care discussion [12/24]  12/24: Will update pt's family when they arrive at bedside.  Labs   CBC: Recent Labs  Lab 02/27/2022 1529 03/08/22 0445 03/09/22 0329  WBC 10.6* 10.8* 14.2*  NEUTROABS 8.9*  --   --   HGB 11.3* 8.8* 9.5*  HCT 36.8* 27.9* 30.0*  MCV 99.2 97.2 96.8  PLT 231 202 251     Basic Metabolic Panel: Recent Labs  Lab 02/18/2022 1529 03/08/22 0445 03/08/22 0704 03/09/22 0329  NA 144 139  --  141  K 4.2 4.3  --  4.0  CL 99 99  --  102  CO2 34* 31  --  30  GLUCOSE 188* 234*  --  202*  BUN 22 32*  --  48*  CREATININE 1.24 1.56*  --  2.12*  CALCIUM 9.4  8.4*  --  8.4*  MG  --   --  2.4 2.7*  PHOS  --  4.4  --  4.6    GFR: Estimated Creatinine Clearance: 21.4 mL/min (A) (by C-G formula based on SCr of 2.12 mg/dL (H)). Recent Labs  Lab 03/03/22 1331 02/18/2022 1529 03/01/2022 2212 03/08/22 0445 03/08/22 0704 03/08/22 0953 03/09/22 0329  PROCALCITON 0.11 0.11  --  2.55  --   --  2.71  WBC  --  10.6*  --  10.8*  --   --  14.2*  LATICACIDVEN  --  1.8 2.6*  --  1.1 1.2  --      Liver Function Tests: Recent Labs  Lab 02/28/2022 1529 03/08/22 0445 03/09/22 0329  AST 45*  --   --   ALT 39  --   --   ALKPHOS 96  --   --   BILITOT 0.9  --   --   PROT 7.5  --   --   ALBUMIN 3.7 2.8* 2.7*    No results for input(s): "LIPASE", "AMYLASE" in the last 168 hours. No results for input(s): "AMMONIA" in the last 168 hours.  ABG    Component Value Date/Time   PHART 7.36 02/22/2022 2052   PCO2ART 61 (H) 02/18/2022 2052   PO2ART 114 (H) 03/13/2022 2052   HCO3 33.6 (H) 03/08/2022 6767  O2SAT 95 03/08/2022 0953     Coagulation Profile: Recent Labs  Lab 03/11/2022 1529  INR 1.0     Cardiac Enzymes: No results for input(s): "CKTOTAL", "CKMB", "CKMBINDEX", "TROPONINI" in the last 168 hours.  HbA1C: Hemoglobin A1C  Date/Time Value Ref Range Status  07/07/2017 12:00 AM 8.2  Final  06/18/2016 03:18 PM 7.9  Final   Hgb A1c MFr Bld  Date/Time Value Ref Range Status  03/01/2022 06:45 AM 9.0 (H) 4.8 - 5.6 % Final    Comment:    (NOTE)         Prediabetes: 5.7 - 6.4         Diabetes: >6.4         Glycemic control for adults with diabetes: <7.0   02/23/2021 12:16 AM 7.4 (H) 4.8 - 5.6 % Final    Comment:    (NOTE)         Prediabetes: 5.7 - 6.4         Diabetes: >6.4         Glycemic control for adults with diabetes: <7.0     CBG: Recent Labs  Lab 03/08/22 1538 03/08/22 1921 03/08/22 2353 03/09/22 0317 03/09/22 0740  GLUCAP 264* 263* 231* 197* 185*     Review of Systems:   Unable to assess due to  intubation/sedation/critical illness   Past Medical History:  He,  has a past medical history of Anemia, Basal cell carcinoma, BPH (benign prostatic hyperplasia), CAD (coronary artery disease), COPD (chronic obstructive pulmonary disease) ([place]), GERD (gastroesophageal reflux disease), Macular degeneration, Pure hypercholesterolemia, Stroke ([place]), Tobacco abuse, Traumatic amputation of leg(s) (complete) (partial), unilateral, level not specified, without mention of complication (33/82/5053), and Unspecified essential hypertension.   Surgical History:   Past Surgical History:  Procedure Laterality Date   AMPUTATION Right 08/26/2011   Below Knee Amputee   APPENDECTOMY     CARDIAC CATHETERIZATION     CAROTID ANGIOGRAPHY Right 05/14/2016   Procedure: Carotid Angiography;  Surgeon: Katha Cabal, MD;  Location: [facility];  Service: Cardiovascular;  Laterality: Right;   CAROTID PTA/STENT INTERVENTION  05/14/2016   Procedure: Carotid PTA/Stent Intervention;  Surgeon: Katha Cabal, MD;  Location: [facility];  Service: Cardiovascular;;   CORONARY ANGIOPLASTY WITH STENT PLACEMENT  09/28/2006   RCA   CORONARY ANGIOPLASTY WITH STENT PLACEMENT  07/21/2006   Mid LAD   CORONARY ANGIOPLASTY WITH STENT PLACEMENT  06/06/2013   30% prox LAD at the site of a prior stent, 40% mid LAD at the origin of D1, 40% ostial D1, 30% prox LCx at the site of a prior stent, in the proximal third of the vessel segment, 20% prox RCA, 99% distal RCA s/p DES. EF >55%.     Social History:   reports that he has been smoking cigarettes. He has a 60.00 pack-year smoking history. He has never used smokeless tobacco. He reports that he does not drink alcohol and does not use drugs.   Family History:  His family history includes Mental illness in his mother.   Allergies No Known Allergies   Home Medications  Prior to Admission medications   Medication Sig Start Date End Date Taking? Authorizing  Provider  albuterol (VENTOLIN HFA) 108 (90 Base) MCG/ACT inhaler Inhale 2 puffs into the lungs every 6 (six) hours as needed for wheezing or shortness of breath. 03/03/22 06/01/22  Fritzi Mandes, MD  amLODipine (NORVASC) 10 MG tablet TAKE ONE-HALF TABLET BY MOUTH EVERY DAY FOR BLOOD PRESSURE  11/26/21   [provider]  amoxicillin-clavulanate (AUGMENTIN) 875-125 MG tablet Take 1 tablet by mouth every 12 (twelve) hours for 6 days. 03/03/22 03/09/22  Fritzi Mandes, MD  aspirin EC 81 MG tablet Take 1 tablet (81 mg total) by mouth daily. 12/01/18   Minna Merritts, MD  atorvastatin (LIPITOR) 40 MG tablet TAKE 1 TABLET BY MOUTH EVERY DAY 10/06/18   Karamalegos, Devonne Doughty, DO  azithromycin (ZITHROMAX) 500 MG tablet Take 1 tablet (500 mg total) by mouth daily for 3 days. 03/04/22 03/09/2022  Fritzi Mandes, MD  Budeson-Glycopyrrol-Formoterol (BREZTRI AEROSPHERE) 160-9-4.8 MCG/ACT AERO Inhale 2 puffs into the lungs in the morning and at bedtime. 04/08/21   Tyler Pita, MD  Cholecalciferol 50 MCG (2000 UT) TABS Take 1 tablet by mouth daily. 08/15/21   [provider]  clopidogrel (PLAVIX) 75 MG tablet Take 1 tablet (75 mg total) by mouth daily for 21 days. 03/04/22 03/25/22  Fritzi Mandes, MD  furosemide (LASIX) 20 MG tablet Take 1 tablet (20 mg total) by mouth daily as needed. 07/30/21   Minna Merritts, MD  ipratropium (ATROVENT) 0.06 % nasal spray Place 2 sprays into both nostrils 4 (four) times daily. 04/08/21   Tyler Pita, MD  ipratropium-albuterol (DUONEB) 0.5-2.5 (3) MG/3ML SOLN USE 1 NEBULIZER SOLUTION (3ML) BY ORAL INHALATION EVERY 6 HOURS AS NEEDED FOR SHORTNESS OF BREATH 09/23/21   [provider]  ketoconazole (NIZORAL) 2 % cream APPLY SUFFICIENT AMOUNT TOPICALLY TWO TIMES A DAY TO RASH ON FOOT. USE FOR 2 WEEKS, THEN DISCONTINUE 11/26/21   [provider]  lisinopril (ZESTRIL) 40 MG tablet TAKE ONE-HALF TABLET BY MOUTH EVERY DAY FOR BLOOD PRESSURE 05/14/21   [provider]  metFORMIN (GLUCOPHAGE) 500 MG tablet Take 1,000 mg by mouth 2 (two) times daily with a meal.    [provider]  metoprolol tartrate (LOPRESSOR) 50 MG tablet TAKE 1 TABLET BY MOUTH TWICE A DAY 10/30/21   Gollan, Kathlene November, MD  nitroGLYCERIN (NITROSTAT) 0.4 MG SL tablet Place 1 tablet (0.4 mg total) under the tongue every 5 (five) minutes as needed for chest pain. 10/13/17   Minna Merritts, MD  predniSONE (DELTASONE) 5 MG tablet Take 1 tablet by mouth daily. 11/26/21   [provider]  senna-docusate (SENOKOT-S) 8.6-50 MG tablet Take 1 tablet by mouth 2 (two) times daily. 03/03/22   Fritzi Mandes, MD  tamsulosin (FLOMAX) 0.4 MG CAPS capsule TAKE ONE CAPSULE BY MOUTH EVERY EVENING TO REPLACE TERAZOSIN 11/15/21   [provider]     Critical care time: 40 minutes     Darel Hong, AGACNP-BC [facility] epic messenger for cross cover needs If after hours, please call E-link

## 2022-03-09 NOTE — Progress Notes (Signed)
Patient had an eventful day, patient was taken off sedation at 0820 per Dr. Gretta Began instructions, patient was awake and following commands at [place] and Dr. Genia Harold notified and responded he would see patient shortly.  Patient gesturing for ETT tube to be pulled out and becoming frustrated that tube was still in place.  Again reached out to MD, by 0915 Dr. Genia Harold assessed patient and ordered for ETT tube to be removed and patient to be placed on bipap.  RT was currently in CT, this nurse remained with the patient until RT was available.  At [place] patient was extubated and placed on bipap by RT.  This nurse remained with the patient from [place] at the time of being instructed to turn off sedation to 0945 when patient was extubated as patient was frustrated and at high risk to self extubate.  Patient later in the shift was changed to 2L [place] and also passed swallow study.  Will endorse to oncoming shift.

## 2022-03-09 NOTE — Progress Notes (Signed)
Initial Nutrition Assessment RD working remotely.   DOCUMENTATION CODES:   Underweight  INTERVENTION:  - adjusted TF regimen to Vital AF 1.2 @ 30 ml/hr to advance by 10 ml every 8 hours to reach goal rate of 50 ml/hr with 100 ml water every 4 hours.  - at goal rate, this regimen (without kcal from propofol) will provide 1440 kcal, 90 grams protein, and 1573 ml water.  - complete NFPE when feasible.   NUTRITION DIAGNOSIS:   Inadequate oral intake related to inability to eat as evidenced by NPO status.  GOAL:   Patient will meet greater than or equal to 90% of their needs  MONITOR:   Vent status, TF tolerance, Labs, Weight trends  REASON FOR ASSESSMENT:   Ventilator, Consult Enteral/tube feeding initiation and management  ASSESSMENT:   81 year-old male with history of CAD, HTN, COPD, hypercholesterolemia, R BKA, GERD, macular degeneration, anemia, stroke, basal cell carcinoma, and LUL cavitary lesion. He presented to the ED with shortness of breath and was found to have acute on chronic hypoxic and hypercapnic respiratory failure secondary to influenza A infection.  Patient intubated 12/22 afternoon and remains intubated at this time with OGT in place (gastric per abdominal x-ray on 12/22). He is receiving Vital High Protein @ 40 ml/hr with 60 ml Prosource TF20 once/day. This regimen is providing 1040 kcal, 104 grams protein, and 802 ml water.  Weight today is 118 lb and weight on 12/22 was 116 lb. Weight has been mainly stable for the past 1 year. No information documented in the edema section of flow sheet.  Patient has not been assessed by a [place] RD since 02/24/21.  Per notes: - severe sepsis due to influenza A infection - hypotension - will be extubated to BiPAP when feasible - AKI - acute metabolic encephalopathy   Patient is currently intubated on ventilator support MV: 7.4 L/min Temp (24hrs), Avg:98.7 F (37.1 C), Min:98.2 F (36.8 C), Max:99.3 F  (37.4 C) Propofol: 14.26 ml/hr (376 kcal/24 hrs)  Labs reviewed; BUN: 48 mg/dl, creatinine: 2.12 mg/dl, Ca: 8.4 mg/dl, GFR: 31 ml/min.  Medications reviewed; 100 mg colace BID, sliding scale novolog, 20 mg solu-medrol/day, 40 mg IV protonix/day, 17 g miralax/day.    NUTRITION - FOCUSED PHYSICAL EXAM:  RD working remotely.  Diet Order:   Diet Order             Diet NPO time specified  Diet effective now                   EDUCATION NEEDS:   No education needs have been identified at this time  Skin:  Skin Assessment: Reviewed RN Assessment  Last BM:  PTA/unknown  Height:   Ht Readings from Last 1 Encounters:  02/26/2022 _0  (1.753 m)    Weight:   Wt Readings from Last 1 Encounters:  03/09/22 53.6 kg    BMI:  Body mass index is 17.45 kg/m.  Estimated Nutritional Needs:  Kcal:  1461 kcal Protein:  80-107 grams (1.5-2 grams/kg) Fluid:  >/= 1.8 L/day     Jarome Matin, MS, RD, LDN, CNSC Clinical Dietitian PRN/Relief staff On-call/weekend pager # available in [facility]

## 2022-03-09 NOTE — Plan of Care (Signed)
Continuing with plan of care. 

## 2022-03-10 LAB — CBC
HCT: 32.5 % — ABNORMAL LOW (ref 39.0–52.0)
Hemoglobin: 10 g/dL — ABNORMAL LOW (ref 13.0–17.0)
MCH: 30 pg (ref 26.0–34.0)
MCHC: 30.8 g/dL (ref 30.0–36.0)
MCV: 97.6 fL (ref 80.0–100.0)
Platelets: 195 10*3/uL (ref 150–400)
RBC: 3.33 MIL/uL — ABNORMAL LOW (ref 4.22–5.81)
RDW: 14.6 % (ref 11.5–15.5)
WBC: 8.9 10*3/uL (ref 4.0–10.5)
nRBC: 0 % (ref 0.0–0.2)

## 2022-03-10 LAB — RENAL FUNCTION PANEL
Albumin: 2.7 g/dL — ABNORMAL LOW (ref 3.5–5.0)
Anion gap: 8 (ref 5–15)
BUN: 48 mg/dL — ABNORMAL HIGH (ref 8–23)
CO2: 33 mmol/L — ABNORMAL HIGH (ref 22–32)
Calcium: 8.5 mg/dL — ABNORMAL LOW (ref 8.9–10.3)
Chloride: 109 mmol/L (ref 98–111)
Creatinine, Ser: 1.69 mg/dL — ABNORMAL HIGH (ref 0.61–1.24)
GFR, Estimated: 41 mL/min — ABNORMAL LOW (ref >60)
Glucose, Bld: 147 mg/dL — ABNORMAL HIGH (ref 70–99)
Phosphorus: 5.1 mg/dL — ABNORMAL HIGH (ref 2.5–4.6)
Potassium: 4.3 mmol/L (ref 3.5–5.1)
Sodium: 150 mmol/L — ABNORMAL HIGH (ref 135–145)

## 2022-03-10 LAB — GLUCOSE, CAPILLARY
Glucose-Capillary: 172 mg/dL — ABNORMAL HIGH (ref 70–99)
Glucose-Capillary: 136 mg/dL — ABNORMAL HIGH (ref 70–99)
Glucose-Capillary: 179 mg/dL — ABNORMAL HIGH (ref 70–99)
Glucose-Capillary: 113 mg/dL — ABNORMAL HIGH (ref 70–99)
Glucose-Capillary: 160 mg/dL — ABNORMAL HIGH (ref 70–99)
Glucose-Capillary: 124 mg/dL — ABNORMAL HIGH (ref 70–99)
Glucose-Capillary: 96 mg/dL (ref 70–99)

## 2022-03-10 LAB — MAGNESIUM: Magnesium: 2.5 mg/dL — ABNORMAL HIGH (ref 1.7–2.4)

## 2022-03-10 MED ORDER — ORAL CARE MOUTH RINSE: 15.0000 mL | OROMUCOSAL | Status: DC | PRN

## 2022-03-10 MED ORDER — ORAL CARE MOUTH RINSE
15.0000 mL | OROMUCOSAL | Status: DC
  Administered 2022-03-11 – 2022-03-12 (×8): 15 mL via OROMUCOSAL

## 2022-03-10 NOTE — Progress Notes (Signed)
2039- Patients BP elevated 173/78, PRN Metoprolol 2.92m IV given.   2115- CNA was in the middle of bathing patient and pts oxygen saturation dropped to 78% on 4 liters [place]. Charge RN was at bedside to assist with repositioning and increased oxygen to 5L [place], patients oxygen saturation recovered  2120- RT notified that patients saturations down to 78% and patient ready for breathing treatment.   2138- RT completed breathing treatment and oxygen decreased to 2L [place].   2156- RT notified that oxygen saturations are down to 88%, patient is in distress,  HR up to 140's and noticeable accessory muscle use.   2159-  PRN Hydralazine 25mIV given for BP 214/149  2200- patient put back on bipap at 35%. Patient verbally expressed that he was anxious. DaHinton DyerNP present at bedside.  2230- patient started on precedex gtt for anxiety with good effect noted. Patient was able to tolerate bipap, BP and HR improved after precedex gtt started.   2245- BP improving 153/76, patient drowsy and tolerating bipap at present.   2330- BP 106/52, patient resting comfortably

## 2022-03-11 ENCOUNTER — Inpatient Hospital Stay: Payer: PPO

## 2022-03-11 DIAGNOSIS — G9341 Metabolic encephalopathy: Secondary | ICD-10-CM

## 2022-03-11 DIAGNOSIS — J984 Other disorders of lung: Secondary | ICD-10-CM

## 2022-03-11 DIAGNOSIS — I5033 Acute on chronic diastolic (congestive) heart failure: Secondary | ICD-10-CM | POA: Diagnosis not present

## 2022-03-11 DIAGNOSIS — R000 Tachycardia, unspecified: Secondary | ICD-10-CM

## 2022-03-11 DIAGNOSIS — E870 Hyperosmolality and hypernatremia: Secondary | ICD-10-CM | POA: Diagnosis present

## 2022-03-11 DIAGNOSIS — J9601 Acute respiratory failure with hypoxia: Secondary | ICD-10-CM | POA: Diagnosis not present

## 2022-03-11 DIAGNOSIS — J9602 Acute respiratory failure with hypercapnia: Secondary | ICD-10-CM

## 2022-03-11 DIAGNOSIS — J441 Chronic obstructive pulmonary disease with (acute) exacerbation: Secondary | ICD-10-CM

## 2022-03-11 DIAGNOSIS — R636 Underweight: Secondary | ICD-10-CM | POA: Diagnosis present

## 2022-03-11 DIAGNOSIS — N179 Acute kidney failure, unspecified: Secondary | ICD-10-CM | POA: Diagnosis present

## 2022-03-11 LAB — BASIC METABOLIC PANEL
Anion gap: 11 (ref 5–15)
BUN: 52 mg/dL — ABNORMAL HIGH (ref 8–23)
CO2: 31 mmol/L (ref 22–32)
Calcium: 8.7 mg/dL — ABNORMAL LOW (ref 8.9–10.3)
Chloride: 110 mmol/L (ref 98–111)
Creatinine, Ser: 1.49 mg/dL — ABNORMAL HIGH (ref 0.61–1.24)
GFR, Estimated: 47 mL/min — ABNORMAL LOW (ref >60)
Glucose, Bld: 125 mg/dL — ABNORMAL HIGH (ref 70–99)
Potassium: 4.1 mmol/L (ref 3.5–5.1)
Sodium: 152 mmol/L — ABNORMAL HIGH (ref 135–145)

## 2022-03-11 LAB — CBC
HCT: 32.4 % — ABNORMAL LOW (ref 39.0–52.0)
Hemoglobin: 10 g/dL — ABNORMAL LOW (ref 13.0–17.0)
MCH: 30.6 pg (ref 26.0–34.0)
MCHC: 30.9 g/dL (ref 30.0–36.0)
MCV: 99.1 fL (ref 80.0–100.0)
Platelets: 189 10*3/uL (ref 150–400)
RBC: 3.27 MIL/uL — ABNORMAL LOW (ref 4.22–5.81)
RDW: 14.6 % (ref 11.5–15.5)
WBC: 6.9 10*3/uL (ref 4.0–10.5)
nRBC: 0 % (ref 0.0–0.2)

## 2022-03-11 LAB — HEMOGLOBIN A1C
Hgb A1c MFr Bld: 8.9 % — ABNORMAL HIGH (ref 4.8–5.6)
Mean Plasma Glucose: 209 mg/dL

## 2022-03-11 LAB — GLUCOSE, CAPILLARY
Glucose-Capillary: 112 mg/dL — ABNORMAL HIGH (ref 70–99)
Glucose-Capillary: 115 mg/dL — ABNORMAL HIGH (ref 70–99)
Glucose-Capillary: 124 mg/dL — ABNORMAL HIGH (ref 70–99)
Glucose-Capillary: 140 mg/dL — ABNORMAL HIGH (ref 70–99)
Glucose-Capillary: 147 mg/dL — ABNORMAL HIGH (ref 70–99)
Glucose-Capillary: 153 mg/dL — ABNORMAL HIGH (ref 70–99)
Glucose-Capillary: 163 mg/dL — ABNORMAL HIGH (ref 70–99)

## 2022-03-11 LAB — CRYPTOCOCCUS ANTIGEN, SERUM: Cryptococcus Antigen, Serum: NEGATIVE

## 2022-03-11 LAB — BRAIN NATRIURETIC PEPTIDE: B Natriuretic Peptide: 217.2 pg/mL — ABNORMAL HIGH (ref 0.0–100.0)

## 2022-03-11 LAB — D-DIMER, QUANTITATIVE: D-Dimer, Quant: 1.3 ug/mL-FEU — ABNORMAL HIGH (ref 0.00–0.50)

## 2022-03-11 LAB — MAGNESIUM: Magnesium: 2.5 mg/dL — ABNORMAL HIGH (ref 1.7–2.4)

## 2022-03-11 LAB — PHOSPHORUS: Phosphorus: 3.7 mg/dL (ref 2.5–4.6)

## 2022-03-11 MED ORDER — OSELTAMIVIR PHOSPHATE 30 MG PO CAPS
30.0000 mg | ORAL_CAPSULE | Freq: Every day | ORAL | Status: AC
  Administered 2022-03-11 – 2022-03-12 (×2): 30 mg via ORAL
  Filled 2022-03-11 (×2): qty 1

## 2022-03-11 MED ORDER — DOCUSATE SODIUM 50 MG/5ML PO LIQD
100.0000 mg | Freq: Two times a day (BID) | ORAL | Status: DC
  Administered 2022-03-11 – 2022-03-12 (×3): 100 mg via ORAL
  Filled 2022-03-11 (×3): qty 10

## 2022-03-11 MED ORDER — SODIUM CHLORIDE 0.45 % IV SOLN: INTRAVENOUS | Status: DC

## 2022-03-11 MED ORDER — POLYETHYLENE GLYCOL 3350 17 G PO PACK
17.0000 g | PACK | Freq: Every day | ORAL | Status: DC
  Administered 2022-03-11 – 2022-03-12 (×2): 17 g via ORAL
  Filled 2022-03-11: qty 1

## 2022-03-11 MED ORDER — RISAQUAD PO CAPS
1.0000 | ORAL_CAPSULE | Freq: Every day | ORAL | Status: DC
  Administered 2022-03-11 – 2022-03-12 (×2): 1 via ORAL
  Filled 2022-03-11 (×3): qty 1

## 2022-03-11 MED ORDER — BUDESONIDE 0.25 MG/2ML IN SUSP
0.2500 mg | Freq: Once | RESPIRATORY_TRACT | Status: AC
  Administered 2022-03-11: 0.25 mg via RESPIRATORY_TRACT
  Filled 2022-03-11: qty 2

## 2022-03-11 MED ORDER — METHYLPREDNISOLONE SODIUM SUCC 40 MG IJ SOLR
40.0000 mg | Freq: Once | INTRAMUSCULAR | Status: AC
  Administered 2022-03-11: 40 mg via INTRAVENOUS
  Filled 2022-03-11: qty 1

## 2022-03-11 MED ORDER — CLONAZEPAM 0.5 MG PO TABS
0.5000 mg | ORAL_TABLET | Freq: Once | ORAL | Status: AC
  Administered 2022-03-11: 0.5 mg via ORAL
  Filled 2022-03-11: qty 1

## 2022-03-11 MED ORDER — CLOPIDOGREL BISULFATE 75 MG PO TABS
75.0000 mg | ORAL_TABLET | Freq: Every day | ORAL | Status: DC
  Administered 2022-03-11 – 2022-03-12 (×2): 75 mg via ORAL
  Filled 2022-03-11: qty 1

## 2022-03-11 MED ORDER — BUDESONIDE 0.25 MG/2ML IN SUSP: 0.2500 mg | Freq: Two times a day (BID) | RESPIRATORY_TRACT | Status: DC

## 2022-03-11 NOTE — Assessment & Plan Note (Signed)
As above.

## 2022-03-11 NOTE — Assessment & Plan Note (Signed)
Covering with sliding scale, elevated also due to steroids

## 2022-03-11 NOTE — Hospital Course (Signed)
81 year old male with past medical history of CAD, hypertension, COPD on 4 L nasal cannula and recently diagnosed left upper lobe cavitary lesion with pending workup outpatient, just discharged on 12/18 who presented back to the emergency room on 12/22 with worsening hypoxia and hypercapnia with respiratory failure secondary to the flu.  Patient confused due to hypercapnia and required intubation and admitted to the ICU.  Treated for COPD exacerbation and flu and able to be extubated on 12/24.  Transferred to hospitalist service on 12/26.

## 2022-03-11 NOTE — Progress Notes (Signed)
[facility] Progress Note  Patient: Stanley Mclean    YHC:[id number]  DOA: 03/11/2022    Date of Service: the patient was seen and examined on 03/11/2022  Brief hospital course: 81 year old male with past medical history of CAD, hypertension, COPD on 4 L nasal cannula and recently diagnosed left upper lobe cavitary lesion with pending workup outpatient, just discharged on 12/18 who presented back to the emergency room on 12/22 with worsening hypoxia and hypercapnia with respiratory failure secondary to the flu.  Patient confused due to hypercapnia and required intubation and admitted to the ICU.  Treated for COPD exacerbation and flu and able to be extubated on 12/24.  Transferred to hospitalist service on 12/26.   Assessment and Plan: * Acute on chronic respiratory failure with hypoxia and hypercarbia (HCC) Second to COPD exacerbation from flu.  Much improved.  Continue steroids, nebulizers, supplemental oxygen.  Almost back to baseline.  COPD exacerbation ([place]) As above.  Pneumonia of both lungs due to infectious organism On IV Rocephin.  Recheck procalcitonin in the morning.  Influenzal pneumonia Completes Tamiflu course 12/26.  AKI (acute kidney injury) ([place]) Creatinine normal at time of previous discharge.  Continue to trend upward, peaking at 2.12 on 12/24.  Improving, and will add gentle IV fluids given hypernatremia  Acute metabolic encephalopathy Secondary to hypercapnia, mostly fully resolved  Acute on chronic diastolic CHF (congestive heart failure) ([place]) Likely from fluid resuscitation on admission.  Mild.  No diuresis given hyper treating hypernatremia  Hypernatremia Treating with gentle half-normal saline.  Diabetes mellitus type 2, noninsulin dependent ([place]) Covering with sliding scale, elevated also due to steroids  Cavitating mass in left upper lung lobe Discovered during previous hospitalization.  Outpatient workup.  Underweight Meets criteria with  BMI of 15.99. Nutrition Status: Now that he is awake and off ventilator, can stop tube feeds and will start p.o. once speech therapy has chance to evaluate.       Body mass index is 15.99 kg/m.  Nutrition Problem: Inadequate oral intake Etiology: inability to eat Pressure Injury 02/23/21 Coccyx Mid;Upper Stage 1 -  Intact skin with non-blanchable redness of a localized area usually over a bony prominence. Red rough non blanchable skin on coccyx (Active)  02/23/21 0530  Location: Coccyx  Location Orientation: Mid;Upper  Staging: Stage 1 -  Intact skin with non-blanchable redness of a localized area usually over a bony prominence.  Wound Description (Comments): Red rough non blanchable skin on coccyx  Present on Admission: Yes     Consultants: Pulmonary  Procedures: Intubated on ventilator 12/22 - 12/24  Antimicrobials: IV Rocephin 12/22-present Tamiflu course completed  Code Status: Full code   Subjective: Patient complains of being tired, breathing still rough  Objective: Vital signs were reviewed and unremarkable. Vitals:   03/11/22 1300 03/11/22 1400  BP: (!) 159/77 (!) 170/71  Pulse: 83 86  Resp: 15 18  Temp: 98.1 F (36.7 C) 98.2 F (36.8 C)  SpO2: 92% 98%    Intake/Output Summary (Last 24 hours) at 03/11/2022 1720 Last data filed at 03/11/2022 1020 Gross per 24 hour  Intake 505.55 ml  Output 260 ml  Net 245.55 ml   Filed Weights   03/08/22 0447 03/09/22 0339 03/11/22 0300  Weight: 53.6 kg 53.6 kg 49.1 kg   Body mass index is 15.99 kg/m.  Exam:  General: Alert and oriented x 3, no acute distress HEENT: Normocephalic, atraumatic, mucous membranes slightly dry Cardiovascular: Regular rate and rhythm, S1-S2 Respiratory: Decreased breath sounds throughout  Abdomen: Soft, nontender, nondistended, positive bowel sounds Musculoskeletal: No clubbing or cyanosis or edema Skin: No skin breaks, tears or lesions Psychiatry: Appropriate, no evidence of  psychoses, flattened affect Neurology: No focal deficits  Data Reviewed: Sodium of 152 with creatinine of 1.49.  BNP of 217  Disposition:  Status is: Inpatient Remains inpatient appropriate because:  -Improvement in sodium -Oxygenation back to baseline -Renal function back to baseline -Evaluation by physical therapy    Anticipated discharge date: 12/29  Family Communication: Will call family DVT Prophylaxis: heparin injection 5,000 Units Start: 03/12/2022 1730 SCDs Start: 03/11/2022 1715    Author: Annita Brod ,MD 03/11/2022 5:20 PM  To reach On-call, see care teams to locate the attending and reach out via [email or web address]. Between 7PM-7AM, please contact night-coverage If you still have difficulty reaching the attending provider, please page the [facility] (Director on Call) for [facility] on amion for assistance.

## 2022-03-11 NOTE — TOC CM/SW Note (Signed)
TOC following progress for return home with home health. Patient is active with [facility] for PT, OT, RN.  Stanley Mclean, [name]

## 2022-03-11 NOTE — Progress Notes (Deleted)
MRN : [id number]  Stanley Mclean is a 81 y.o. (24-Apr-1940) male who presents with chief complaint of check carotid arteries.  History of Present Illness:   The patient is seen for follow up evaluation of carotid stenosis. The carotid stenosis followed by ultrasound and angiography.   Procedure 05/14/2021: Catheter placement into right common carotid artery   These views showed the right common carotid and external carotid artery are widely patent. There appears to be a 50% stenosis at the origin of the right internal carotid artery. This is focal and localized to the origin. The distal internal carotid artery and the Petrie's portion of the internal carotid artery are widely patent. Intracranial arteries for the right appear normal. Based on this finding intervention is not indicated at this time and therefore stenting is not performed.   The patient denies amaurosis fugax. There is no recent history of TIA symptoms or focal motor deficits. There is no prior documented CVA.  The patient is taking enteric-coated aspirin 81 mg daily.  There is no history of migraine headaches. There is no history of seizures.  No recent shortening of the patient's walking distance or new symptoms consistent with claudication.  No history of rest pain symptoms. No new ulcers or wounds of the lower extremities have occurred.  There is no history of DVT, PE or superficial thrombophlebitis. No recent episodes of angina or shortness of breath documented.   Carotid Duplex done today shows ***.  No change compared to last study in ***  No outpatient medications have been marked as taking for the 03/20/22 encounter (Appointment) with Delana Meyer, Dolores Lory, MD.    Past Medical History:  Diagnosis Date   Anemia    Basal cell carcinoma    BPH (benign prostatic hyperplasia)    CAD (coronary artery disease)    a. s/p multiple prior PCIs b. s/p DES-distal RCA 05/2013   COPD (chronic obstructive pulmonary  disease) (HCC)    GERD (gastroesophageal reflux disease)    Macular degeneration    Pure hypercholesterolemia    Stroke ([place])    Tobacco abuse    Traumatic amputation of leg(s) (complete) (partial), unilateral, level not specified, without mention of complication 29/79/8921   Right Below Knee Amputation   Unspecified essential hypertension     Past Surgical History:  Procedure Laterality Date   AMPUTATION Right 08/26/2011   Below Knee Amputee   APPENDECTOMY     CARDIAC CATHETERIZATION     CAROTID ANGIOGRAPHY Right 05/14/2016   Procedure: Carotid Angiography;  Surgeon: Katha Cabal, MD;  Location: [facility];  Service: Cardiovascular;  Laterality: Right;   CAROTID PTA/STENT INTERVENTION  05/14/2016   Procedure: Carotid PTA/Stent Intervention;  Surgeon: Katha Cabal, MD;  Location: [facility];  Service: Cardiovascular;;   CORONARY ANGIOPLASTY WITH STENT PLACEMENT  09/28/2006   RCA   CORONARY ANGIOPLASTY WITH STENT PLACEMENT  07/21/2006   Mid LAD   CORONARY ANGIOPLASTY WITH STENT PLACEMENT  06/06/2013   30% prox LAD at the site of a prior stent, 40% mid LAD at the origin of D1, 40% ostial D1, 30% prox LCx at the site of a prior stent, in the proximal third of the vessel segment, 20% prox RCA, 99% distal RCA s/p DES. EF >55%.    Social History Social History   Tobacco Use   Smoking status: Every Day    Packs/day: 1.00    Years: 60.00    Total pack years:  60.00    Types: Cigarettes    Last attempt to quit: 03/29/2016    Years since quitting: 5.9   Smokeless tobacco: Never   Tobacco comments:    1 pack will last 1 week--03/05/21  Vaping Use   Vaping Use: Never used  Substance Use Topics   Alcohol use: No   Drug use: No    Family History Family History  Problem Relation Age of Onset   Mental illness Mother     No Known Allergies   REVIEW OF SYSTEMS (Negative unless checked)  Constitutional: _0 Weight loss  _1 Fever  _2 Chills Cardiac: _3 Chest  pain   _4 Chest pressure   _5 Palpitations   _6 Shortness of breath when laying flat   _7 Shortness of breath with exertion. Vascular:  _8 Pain in legs with walking   _9 Pain in legs at rest  _10 History of DVT   _11 Phlebitis   _12 Swelling in legs   _13 Varicose veins   _14 Non-healing ulcers Pulmonary:   _15 Uses home oxygen   _16 Productive cough   _17 Hemoptysis   _18 Wheeze  _19 COPD   _20 Asthma Neurologic:  _21 Dizziness   _22 Seizures   _23 History of stroke   _24 History of TIA  _25 Aphasia   _26 Vissual changes   _27 Weakness or numbness in arm   _28 Weakness or numbness in leg Musculoskeletal:   _29 Joint swelling   _30 Joint pain   _31 Low back pain Hematologic:  _32 Easy bruising  _33 Easy bleeding   _34 Hypercoagulable state   _35 Anemic Gastrointestinal:  _36 Diarrhea   _37 Vomiting  _38 Gastroesophageal reflux/heartburn   _39 Difficulty swallowing. Genitourinary:  _40 Chronic kidney disease   _41 Difficult urination  _42 Frequent urination   _43 Blood in urine Skin:  _44 Rashes   _45 Ulcers  Psychological:  _46 History of anxiety   _47  History of major depression.  Physical Examination  There were no vitals filed for this visit. There is no height or weight on file to calculate BMI. Gen: WD/WN, NAD Head: [place]/AT, No temporalis wasting.  Ear/Nose/Throat: Hearing grossly intact, nares w/o erythema or drainage Eyes: PER, EOMI, sclera nonicteric.  Neck: Supple, no masses.  No bruit or JVD.  Pulmonary:  Good air movement, no audible wheezing, no use of accessory muscles.  Cardiac: RRR, normal S1, S2, no Murmurs. Vascular:  carotid bruit noted Vessel Right Left  Radial Palpable Palpable  Carotid  Palpable  Palpable  Subclav  Palpable Palpable  Gastrointestinal: soft, non-distended. No guarding/no peritoneal signs.  Musculoskeletal: M/S 5/5 throughout.  No visible deformity.  Neurologic: CN 2-12 intact. Pain and light touch intact in extremities.  Symmetrical.  Speech is fluent. Motor exam as listed above. Psychiatric: Judgment intact, Mood & affect  appropriate for pt's clinical situation. Dermatologic: No rashes or ulcers noted.  No changes consistent with cellulitis.   CBC Lab Results  Component Value Date   WBC 6.9 03/11/2022   HGB 10.0 (L) 03/11/2022   HCT 32.4 (L) 03/11/2022   MCV 99.1 03/11/2022   PLT 189 03/11/2022    BMET    Component Value Date/Time   NA 152 (H) 03/11/2022 0541   NA 140 07/07/2017 0000   NA 141 06/07/2013 0459   K 4.1 03/11/2022 0541   K 3.1 (L) 06/07/2013 0459   CL 110 03/11/2022 0541   CL 106 06/07/2013 0459   CO2 31 03/11/2022 0541   CO2 28 06/07/2013 0459   GLUCOSE 125 (H) 03/11/2022 0541   GLUCOSE 97 06/07/2013 0459   BUN 52 (H) 03/11/2022 0541   BUN 14 07/07/2017 0000   BUN 13 06/07/2013 0459   CREATININE 1.49 (  H) 03/11/2022 0541   CREATININE 1.06 06/07/2013 0459   CALCIUM 8.7 (L) 03/11/2022 0541   CALCIUM 8.5 06/07/2013 0459   GFRNONAA 47 (L) 03/11/2022 0541   GFRNONAA >60 06/07/2013 0459   GFRAA >60 05/12/2016 1327   GFRAA >60 06/07/2013 0459   Estimated Creatinine Clearance: 27.9 mL/min (A) (by C-G formula based on SCr of 1.49 mg/dL (H)).  COAG Lab Results  Component Value Date   INR 1.0 02/19/2022   INR 0.95 03/30/2016   INR 0.9 06/01/2013    Radiology DG Abd 1 View  Result Date: 03/05/2022 CLINICAL DATA:  Check gastric catheter placement EXAM: ABDOMEN - 1 VIEW COMPARISON:  Film from earlier in the same day. FINDINGS: Gastric catheter is been further advanced into the stomach. No other focal abnormality is noted. IMPRESSION: Gastric catheter further advanced into the stomach. Electronically Signed   By: Inez Catalina M.D.   On: 02/25/2022 22:36   DG Abd 1 View  Result Date: 03/13/2022 CLINICAL DATA:  Check gastric catheter placement EXAM: ABDOMEN - 1 VIEW COMPARISON:  Film from earlier in the same day. FINDINGS: Gastric catheter has been advanced slightly deeper into the stomach with the proximal side port just beyond the gastroesophageal junction. No obstructive  changes are seen. IMPRESSION: Gastric catheter as described. Electronically Signed   By: Inez Catalina M.D.   On: 02/28/2022 21:41   DG Abd 1 View  Result Date: 03/14/2022 CLINICAL DATA:  Check gastric catheter placement EXAM: ABDOMEN - 1 VIEW COMPARISON:  None Available. FINDINGS: Gastric catheter extends into the stomach. Proximal side port however lies in the distal esophagus. This should be advanced several cm deeper into the stomach. No free air is noted. Right renal calculus is again noted. IMPRESSION: Gastric catheter as described. This should be advanced deeper into the stomach. Electronically Signed   By: Inez Catalina M.D.   On: 02/26/2022 20:30   CT CHEST WO CONTRAST  Result Date: 03/01/2022 CLINICAL DATA:  Pneumonia, failing outpatient treatment EXAM: CT CHEST WITHOUT CONTRAST TECHNIQUE: Multidetector CT imaging of the chest was performed following the standard protocol without IV contrast. RADIATION DOSE REDUCTION: This exam was performed according to the departmental dose-optimization program which includes automated exposure control, adjustment of the mA and/or kV according to patient size and/or use of iterative reconstruction technique. COMPARISON:  CT chest abdomen pelvis, 03/02/2022 FINDINGS: Cardiovascular: Aortic atherosclerosis. Normal heart size. Extensive three-vessel coronary artery calcifications. No pericardial effusion. Mediastinum/Nodes: Prominent mediastinal and bilateral hilar lymph nodes, unchanged. Thyroid gland, trachea, and esophagus demonstrate no significant findings. Lungs/Pleura: Endotracheal intubation, tube tip in the midtrachea. Frothy debris in the lower trachea and left mainstem bronchus. Severe emphysema. Diffuse bilateral bronchial wall thickening. Extensive bilateral heterogeneous ground-glass and consolidative airspace opacity throughout the lungs is slightly worsened compared to prior examination. No significant change in a serpiginous, thick-walled cavitary  lesion about the peripheral left lung, the largest axial component at the posterolateral left upper lobe measuring 4.4 x 2.5 cm (series 3, image 34). Unchanged small nodule of the superior segment left lower lobe measuring 0.5 cm (series 3, image 79). No pleural effusion or pneumothorax. Upper Abdomen: No acute abnormality. Nonobstructive right nephrolithiasis. Musculoskeletal: No chest wall abnormality. No acute osseous findings. IMPRESSION: 1. Extensive bilateral heterogeneous ground-glass and consolidative airspace opacity throughout the lungs is slightly worsened compared to prior examination. Findings are consistent with worsened multifocal infection. 2. No significant change in a serpiginous, thick-walled cavitary lesion about the peripheral left lung, the largest axial component  at the posterolateral left upper lobe measuring 4.4 x 2.5 cm. 3. Endotracheal intubation. 4. Severe emphysema and diffuse bilateral bronchial wall thickening. 5. Frothy debris in the lower trachea and left mainstem bronchus. 6. Coronary artery disease. 7. Nonobstructive right nephrolithiasis. Aortic Atherosclerosis (ICD10-I70.0) and Emphysema (ICD10-J43.9). Electronically Signed   By: Delanna Ahmadi M.D.   On: 02/18/2022 17:56   DG Chest Portable 1 View  Result Date: 02/26/2022 CLINICAL DATA:  Status post intubation. EXAM: PORTABLE CHEST 1 VIEW COMPARISON:  February 28, 2022. FINDINGS: The heart size and mediastinal contours are within normal limits. Nasogastric tube is seen in right mainstem bronchus. Endotracheal tube is in good position. Bilateral lung opacities are again noted concerning for multifocal pneumonia. The visualized skeletal structures are unremarkable. IMPRESSION: Endotracheal tube is in grossly good position. Nasogastric tube is seen in right mainstem bronchus; withdrawal is recommended. Critical Value/emergent results were called by telephone at the time of interpretation on 03/09/2022 at 3:38 pm to provider Dr.  Eula Listen, who verbally acknowledged these results. Electronically Signed   By: Marijo Conception M.D.   On: 03/05/2022 15:39   ECHOCARDIOGRAM COMPLETE  Result Date: 03/03/2022    ECHOCARDIOGRAM REPORT   Patient Name:   LAMOINE MAGALLON Date of Exam: 03/03/2022 Medical Rec #:  [id number]       Height:       69.0 in Accession #:    [id number]      Weight:       116.4 lb Date of Birth:  1940/09/18       BSA:          1.641 m Patient Age:    89 years        BP:           138/52 mmHg Patient Gender: M               HR:           100 bpm. Exam Location:  [facility] Procedure: 2D Echo, Color Doppler and Cardiac Doppler Indications:     I63.9 Stroke  History:         Patient has prior history of Echocardiogram examinations, most                  recent 04/03/2021. CAD, COPD, Signs/Symptoms:Altered mental                  status; Risk Factors:HCL and Current Smoker.  Sonographer:     Charmayne Sheer Referring Phys:  [id number] Lorenza Chick Diagnosing Phys: Kathlyn Sacramento MD  Sonographer Comments: Suboptimal apical window and suboptimal subcostal window. Image acquisition challenging due to uncooperative patient and Image acquisition challenging due to COPD. IMPRESSIONS  1. Left ventricular ejection fraction, by estimation, is 55 to 60%. The left ventricle has normal function. The left ventricle has no regional wall motion abnormalities. Left ventricular diastolic parameters are consistent with Grade II diastolic dysfunction (pseudonormalization).  2. Right ventricular systolic function is normal. The right ventricular size is normal.  3. The mitral valve is normal in structure. Trivial mitral valve regurgitation. No evidence of mitral stenosis. Moderate mitral annular calcification.  4. The aortic valve is calcified. Aortic valve regurgitation is not visualized. Mild aortic valve stenosis. Aortic valve mean gradient measures 8.0 mmHg. Aortic valve Vmax measures 1.83 m/s.  5. The inferior vena cava is normal in size with greater than  50% respiratory variability, suggesting right atrial pressure of 3 mmHg. FINDINGS  Left Ventricle: Left ventricular  ejection fraction, by estimation, is 55 to 60%. The left ventricle has normal function. The left ventricle has no regional wall motion abnormalities. The left ventricular internal cavity size was normal in size. There is  borderline left ventricular hypertrophy. Left ventricular diastolic parameters are consistent with Grade II diastolic dysfunction (pseudonormalization). Right Ventricle: The right ventricular size is normal. No increase in right ventricular wall thickness. Right ventricular systolic function is normal. Left Atrium: Left atrial size was normal in size. Right Atrium: Right atrial size was normal in size. Pericardium: There is no evidence of pericardial effusion. Mitral Valve: The mitral valve is normal in structure. Moderate mitral annular calcification. Trivial mitral valve regurgitation. No evidence of mitral valve stenosis. Tricuspid Valve: The tricuspid valve is normal in structure. Tricuspid valve regurgitation is trivial. No evidence of tricuspid stenosis. Aortic Valve: The aortic valve is calcified. Aortic valve regurgitation is not visualized. Mild aortic stenosis is present. Aortic valve mean gradient measures 8.0 mmHg. Aortic valve peak gradient measures 13.4 mmHg. Aortic valve area, by VTI measures 1.72 cm. Pulmonic Valve: The pulmonic valve was normal in structure. Pulmonic valve regurgitation is not visualized. No evidence of pulmonic stenosis. Aorta: The aortic root is normal in size and structure. Venous: The inferior vena cava is normal in size with greater than 50% respiratory variability, suggesting right atrial pressure of 3 mmHg. IAS/Shunts: No atrial level shunt detected by color flow Doppler.  LEFT VENTRICLE PLAX 2D LVIDd:         3.90 cm   Diastology LVIDs:         2.40 cm   LV e' medial:    5.55 cm/s LV PW:         1.10 cm   LV E/e' medial:  16.7 LV IVS:         0.80 cm   LV e' lateral:   5.33 cm/s LVOT diam:     1.90 cm   LV E/e' lateral: 17.4 LV SV:         54 LV SV Index:   33 LVOT Area:     2.84 cm  LEFT ATRIUM           Index LA diam:      2.50 cm 1.52 cm/m LA Vol (A4C): 18.1 ml 11.03 ml/m  AORTIC VALVE                     PULMONIC VALVE AV Area (Vmax):    1.58 cm      PV Vmax:       1.58 m/s AV Area (Vmean):   1.60 cm      PV Vmean:      99.800 cm/s AV Area (VTI):     1.72 cm      PV VTI:        0.256 m AV Vmax:           183.00 cm/s   PV Peak grad:  10.0 mmHg AV Vmean:          133.000 cm/s  PV Mean grad:  5.0 mmHg AV VTI:            0.315 m AV Peak Grad:      13.4 mmHg AV Mean Grad:      8.0 mmHg LVOT Vmax:         102.00 cm/s LVOT Vmean:        75.000 cm/s LVOT VTI:          0.191  m LVOT/AV VTI ratio: 0.61  AORTA Ao Root diam: 3.30 cm MITRAL VALVE MV Area (PHT): 4.45 cm     SHUNTS MV Decel Time: 171 msec     Systemic VTI:  0.19 m MV E velocity: 92.55 cm/s   Systemic Diam: 1.90 cm MV A velocity: 117.00 cm/s MV E/A ratio:  0.79 Kathlyn Sacramento MD Electronically signed by Kathlyn Sacramento MD Signature Date/Time: 03/03/2022/1:47:36 PM    Final    CT ANGIO HEAD NECK W WO CM  Result Date: 03/02/2022 CLINICAL DATA:  Stroke/TIA EXAM: CT ANGIOGRAPHY HEAD AND NECK TECHNIQUE: Multidetector CT imaging of the head and neck was performed using the standard protocol during bolus administration of intravenous contrast. Multiplanar CT image reconstructions and MIPs were obtained to evaluate the vascular anatomy. Carotid stenosis measurements (when applicable) are obtained utilizing NASCET criteria, using the distal internal carotid diameter as the denominator. RADIATION DOSE REDUCTION: This exam was performed according to the departmental dose-optimization program which includes automated exposure control, adjustment of the mA and/or kV according to patient size and/or use of iterative reconstruction technique. CONTRAST:  171m OMNIPAQUE IOHEXOL 350 MG/ML SOLN COMPARISON:   02/28/2022 FINDINGS: CT HEAD FINDINGS Brain: There is no mass, hemorrhage or extra-axial collection. There is generalized atrophy without lobar predilection. There is hypoattenuation of the periventricular white matter, most commonly indicating chronic ischemic microangiopathy. Skull: The visualized skull base, calvarium and extracranial soft tissues are normal. Sinuses/Orbits: No fluid levels or advanced mucosal thickening of the visualized paranasal sinuses. No mastoid or middle ear effusion. The orbits are normal. CTA NECK FINDINGS SKELETON: There is no bony spinal canal stenosis. No lytic or blastic lesion. OTHER NECK: Normal pharynx, larynx and major salivary glands. No cervical lymphadenopathy. Unremarkable thyroid gland. UPPER CHEST: Emphysema. Multifocal cavitation in the left upper lobe. AORTIC ARCH: There is calcific atherosclerosis of the aortic arch. There is no aneurysm, dissection or hemodynamically significant stenosis of the visualized portion of the aorta. Conventional 3 vessel aortic branching pattern. The visualized proximal subclavian arteries are widely patent. RIGHT CAROTID SYSTEM: No dissection, occlusion or aneurysm. There is predominantly calcified atherosclerosis extending into the proximal ICA, resulting in 60% stenosis. Mild calcific atherosclerosis in the common carotid artery without stenosis. LEFT CAROTID SYSTEM: No dissection, occlusion or aneurysm. Mild atherosclerotic calcification at the carotid bifurcation without hemodynamically significant stenosis. Mild atherosclerosis within the left common carotid artery. VERTEBRAL ARTERIES: Right dominant configuration. Both origins are clearly patent. There is no dissection, occlusion or flow-limiting stenosis to the skull base (V1-V3 segments). CTA HEAD FINDINGS POSTERIOR CIRCULATION: --Vertebral arteries: Normal V4 segments. --Inferior cerebellar arteries: Normal. --Basilar artery: Normal. --Superior cerebellar arteries: Normal.  --Posterior cerebral arteries (PCA): Normal. ANTERIOR CIRCULATION: --Intracranial internal carotid arteries: Atherosclerotic calcification of the internal carotid arteries at the skull base without hemodynamically significant stenosis. --Anterior cerebral arteries (ACA): Normal. Both A1 segments are present. Patent anterior communicating artery (a-comm). --Middle cerebral arteries (MCA): Normal. VENOUS SINUSES: As permitted by contrast timing, patent. ANATOMIC VARIANTS: None Review of the MIP images confirms the above findings. IMPRESSION: 1. No emergent large vessel occlusion or high-grade stenosis of the intracranial arteries. 2. Approximately 60% stenosis of the proximal right ICA secondary to predominantly calcified atherosclerosis. 3. Abnormalities within the chest are better characterized on the chest CT. Aortic atherosclerosis (ICD10-I70.0). Electronically Signed   By: KUlyses JarredM.D.   On: 03/02/2022 01:57   CT CHEST ABDOMEN PELVIS W CONTRAST  Result Date: 03/02/2022 CLINICAL DATA:  Sepsis. EXAM: CT CHEST, ABDOMEN, AND PELVIS WITH  CONTRAST TECHNIQUE: Multidetector CT imaging of the chest, abdomen and pelvis was performed following the standard protocol during bolus administration of intravenous contrast. RADIATION DOSE REDUCTION: This exam was performed according to the departmental dose-optimization program which includes automated exposure control, adjustment of the mA and/or kV according to patient size and/or use of iterative reconstruction technique. CONTRAST:  176m OMNIPAQUE IOHEXOL 350 MG/ML SOLN COMPARISON:  CT chest 04/03/2021 FINDINGS: CT CHEST FINDINGS Cardiovascular: No significant vascular findings. Normal heart size. No pericardial effusion. There are atherosclerotic calcifications of the aorta and coronary arteries. Mediastinum/Nodes: No enlarged mediastinal, hilar, or axillary lymph nodes. Thyroid gland, trachea, and esophagus demonstrate no significant findings. Lungs/Pleura: Severe  emphysematous changes are again seen. Are new patchy areas of airspace disease peripherally in the bilateral upper lobes, right middle lobe and right lower lobe. There is a new thick-walled cavitary lesion in the left upper lobe abutting the pleura measuring 4.9 x 3.2 x 5.4 cm. There is some surrounding patchy airspace disease. This appears contiguous with oblong airspace opacity in the left upper lobe abutting the pleura image 8/50 measuring 1.9 x 3.4 by 1.5 cm. There is a 3 mm nodule in the left lower lobe image 8/79 which is new from prior. There are trace bilateral pleural effusions. No pneumothorax. Musculoskeletal: No chest wall mass or suspicious bone lesions identified. CT ABDOMEN PELVIS FINDINGS Hepatobiliary: No focal liver abnormality is seen. No gallstones, gallbladder wall thickening, or biliary dilatation. Pancreas: Unremarkable. No pancreatic ductal dilatation or surrounding inflammatory changes. Spleen: Normal in size without focal abnormality. Adrenals/Urinary Tract: There are numerous bilateral renal cysts. The largest is in the left kidney measuring 4 cm. There is a mildly hyperdense likely cysts in the right kidney measuring 16 mm. There is no hydronephrosis or perinephric fat stranding. There is a 3 mm calculus in the right kidney. The adrenal glands are within normal limits. The bladder is decompressed by Foley catheter. Stomach/Bowel: There are postsurgical changes in the colon, likely hemicolectomy. There is a large amount of stool throughout the colon. There is no bowel obstruction, pneumatosis or free air. There is sigmoid colon diverticulosis. The stomach is decompressed. Vascular/Lymphatic: Aortic atherosclerosis. No enlarged abdominal or pelvic lymph nodes. Reproductive: Prostate is unremarkable. Other: No abdominal wall hernia or abnormality. No abdominopelvic ascites. Musculoskeletal: No acute or significant osseous findings. IMPRESSION: 1. New thick-walled cavitary lesion in the left  upper lobe measuring up to 5.4 cm. There is some surrounding patchy airspace disease. Findings are worrisome for cavitary neoplasm. Infection is also in the differential. 2. New patchy areas of airspace disease peripherally in the bilateral upper lobes, right middle lobe and right lower lobe worrisome for multifocal pneumonia. 3. Trace bilateral pleural effusions. 4. Severe emphysema. 5. Nonobstructing right renal calculus. 6. Colonic diverticulosis. 7. Bilateral renal cysts. One of the cysts in the right kidney is mildly hyperdense and may represent hemorrhagic or proteinaceous cyst. This can be further evaluated with ultrasound. 8. Large amount of stool throughout the colon. 9. There is a 3 mm nodule in the left lower lobe which is new from prior, indeterminate given other findings. Aortic Atherosclerosis (ICD10-I70.0) and Emphysema (ICD10-J43.9). Electronically Signed   By: ARonney AstersM.D.   On: 03/02/2022 01:54   MR BRAIN WO CONTRAST  Result Date: 02/28/2022 CLINICAL DATA:  Altered mental status EXAM: MRI HEAD WITHOUT CONTRAST TECHNIQUE: Multiplanar, multiecho pulse sequences of the brain and surrounding structures were obtained without intravenous contrast. COMPARISON:  None Available. FINDINGS: Brain: There are a few  small foci of abnormal diffusion restriction in the left PCA territory. Fewer than 5 scattered microhemorrhages in a nonspecific pattern. There is multifocal hyperintense T2-weighted signal within the white matter. Generalized volume loss. 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 The midline structures are normal. Vascular: Major flow voids are preserved. Skull and upper cervical spine: Normal calvarium and skull base. Visualized upper cervical spine and soft tissues are normal. Sinuses/Orbits:No paranasal sinus fluid levels or advanced mucosal thickening. No mastoid or middle ear effusion. Normal orbits. IMPRESSION: 1. A few small foci of acute ischemia in the left PCA  territory. No hemorrhage or mass effect. 2. 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 Electronically Signed   By: Ulyses Jarred M.D.   On: 02/28/2022 20:03   CT HEAD WO CONTRAST (5MM)  Result Date: 02/28/2022 CLINICAL DATA:  Mental status change. EXAM: CT HEAD WITHOUT CONTRAST TECHNIQUE: Contiguous axial images were obtained from the base of the skull through the vertex without intravenous contrast. RADIATION DOSE REDUCTION: This exam was performed according to the departmental dose-optimization program which includes automated exposure control, adjustment of the mA and/or kV according to patient size and/or use of iterative reconstruction technique. COMPARISON:  Head CT 03/30/2016 is FINDINGS: Brain: No acute intracranial hemorrhage. No focal mass lesion. No CT evidence of acute infarction. No midline shift or mass effect. No hydrocephalus. Basilar cisterns are patent. There are periventricular and subcortical white matter hypodensities. Generalized cortical atrophy. Vascular: No hyperdense vessel or unexpected calcification. Skull: Normal. Negative for fracture or focal lesion. Sinuses/Orbits: Paranasal sinuses and mastoid air cells are clear. Orbits are clear. Other: None. IMPRESSION: No acute intracranial findings. Electronically Signed   By: Suzy Bouchard M.D.   On: 02/28/2022 11:32   DG Chest Portable 1 View  Result Date: 02/28/2022 CLINICAL DATA:  Altered mental status. EXAM: PORTABLE CHEST 1 VIEW COMPARISON:  Chest x-ray dated February 22, 2021. FINDINGS: The heart size and mediastinal contours are within normal limits. Normal pulmonary vascularity. New mild asymmetric nodular opacities in the right lower lobe, peripheral right mid lung, and peripheral left upper lobe. No pleural effusion or pneumothorax. No acute osseous abnormality. IMPRESSION: 1. New mild asymmetric nodular opacities in the both lungs, concerning for multifocal pneumonia. Electronically Signed   By: Titus Dubin M.D.   On: 02/28/2022 11:28     Assessment/Plan There are no diagnoses linked to this encounter.   Hortencia Pilar, MD  03/11/2022 4:05 PM

## 2022-03-11 NOTE — Assessment & Plan Note (Signed)
Second to COPD exacerbation from flu.  Much improved.  Continue steroids, nebulizers, supplemental oxygen.  Almost back to baseline.

## 2022-03-11 NOTE — Assessment & Plan Note (Signed)
Secondary to hypercapnia, mostly fully resolved

## 2022-03-11 NOTE — Assessment & Plan Note (Signed)
Discovered during previous hospitalization.  Outpatient workup.

## 2022-03-11 NOTE — Assessment & Plan Note (Signed)
Likely from fluid resuscitation on admission.  Mild.  No diuresis initially given trying to treat hypernatremia.  Now with hypoxia worse and sodium stable, have stopped this and giving Lasix.  Repeat ABG notes increasing bicarb so change Lasix to Diamox to stop respiratory alkalosis

## 2022-03-11 NOTE — Assessment & Plan Note (Signed)
Meets criteria with BMI of 15.99. Nutrition Status: Now that he is awake and off ventilator, can stop tube feeds and will start p.o. once speech therapy has chance to evaluate.

## 2022-03-11 NOTE — Assessment & Plan Note (Signed)
Completes Tamiflu course 12/26.

## 2022-03-11 NOTE — Progress Notes (Signed)
CROSS COVER NOTE  NAME: Stanley Mclean MRN: [id number] DOB : [birth year] ATTENDING PHYSICIAN: Annita Brod, MD    Date of Service   03/11/2022   HPI/Events of Note   Tachycardia Tachypnea Feels like he can't breathe  Interventions   Assessment/Plan: Klonopin 0.65m Pulmicort Solu-Medrol EKG DDimer        To reach the provider On-Call:   7AM- 7PM see care teams to locate the attending and reach out to them via [email or web address] 7PM-7AM contact night-coverage If you still have difficulty reaching the appropriate provider, please page the [facility]) for [facility] on amion for assistance  This document was prepared using DSet designersoftware and may include unintentional dictation errors.  KNeomia GlassDNP, MBA, FNP-BC Nurse Practitioner [facility]ager ([phone]

## 2022-03-11 NOTE — Assessment & Plan Note (Addendum)
On IV Rocephin.  Recheck procalcitonin in the morning.

## 2022-03-11 NOTE — Assessment & Plan Note (Signed)
Creatinine normal at time of previous discharge.  Continue to trend upward, peaking at 2.12 on 12/24.  Improving, and will briefly added IV fluids given hypernatremia

## 2022-03-11 NOTE — Assessment & Plan Note (Signed)
Treating with gentle half-normal saline.

## 2022-03-12 ENCOUNTER — Inpatient Hospital Stay: Payer: PPO

## 2022-03-12 DIAGNOSIS — J441 Chronic obstructive pulmonary disease with (acute) exacerbation: Secondary | ICD-10-CM | POA: Diagnosis not present

## 2022-03-12 DIAGNOSIS — J9601 Acute respiratory failure with hypoxia: Secondary | ICD-10-CM | POA: Diagnosis not present

## 2022-03-12 DIAGNOSIS — I5033 Acute on chronic diastolic (congestive) heart failure: Secondary | ICD-10-CM | POA: Diagnosis not present

## 2022-03-12 DIAGNOSIS — G9341 Metabolic encephalopathy: Secondary | ICD-10-CM | POA: Diagnosis not present

## 2022-03-12 LAB — BLOOD GAS, ARTERIAL
Acid-Base Excess: 13.5 mmol/L — ABNORMAL HIGH (ref 0.0–2.0)
Bicarbonate: 40.2 mmol/L — ABNORMAL HIGH (ref 20.0–28.0)
O2 Content: 15 L/min
O2 Saturation: 95.9 %
Patient temperature: 37
pCO2 arterial: 62 mmHg — ABNORMAL HIGH (ref 32–48)
pH, Arterial: 7.42 (ref 7.35–7.45)
pO2, Arterial: 74 mmHg — ABNORMAL LOW (ref 83–108)

## 2022-03-12 LAB — BASIC METABOLIC PANEL
Anion gap: 10 (ref 5–15)
BUN: 56 mg/dL — ABNORMAL HIGH (ref 8–23)
CO2: 29 mmol/L (ref 22–32)
Calcium: 8.5 mg/dL — ABNORMAL LOW (ref 8.9–10.3)
Chloride: 108 mmol/L (ref 98–111)
Creatinine, Ser: 1.31 mg/dL — ABNORMAL HIGH (ref 0.61–1.24)
GFR, Estimated: 55 mL/min — ABNORMAL LOW (ref >60)
Glucose, Bld: 251 mg/dL — ABNORMAL HIGH (ref 70–99)
Potassium: 4.3 mmol/L (ref 3.5–5.1)
Sodium: 147 mmol/L — ABNORMAL HIGH (ref 135–145)

## 2022-03-12 LAB — GLUCOSE, CAPILLARY
Glucose-Capillary: 216 mg/dL — ABNORMAL HIGH (ref 70–99)
Glucose-Capillary: 151 mg/dL — ABNORMAL HIGH (ref 70–99)
Glucose-Capillary: 173 mg/dL — ABNORMAL HIGH (ref 70–99)
Glucose-Capillary: 206 mg/dL — ABNORMAL HIGH (ref 70–99)
Glucose-Capillary: 201 mg/dL — ABNORMAL HIGH (ref 70–99)
Glucose-Capillary: 184 mg/dL — ABNORMAL HIGH (ref 70–99)

## 2022-03-12 LAB — CULTURE, BLOOD (ROUTINE X 2)
Culture: NO GROWTH
Culture: NO GROWTH
Special Requests: ADEQUATE
Special Requests: ADEQUATE

## 2022-03-12 LAB — BRAIN NATRIURETIC PEPTIDE: B Natriuretic Peptide: 236.3 pg/mL — ABNORMAL HIGH (ref 0.0–100.0)

## 2022-03-12 LAB — MAGNESIUM: Magnesium: 2.3 mg/dL (ref 1.7–2.4)

## 2022-03-12 LAB — PROCALCITONIN: Procalcitonin: 0.4 ng/mL

## 2022-03-12 MED ORDER — HYDRALAZINE HCL 50 MG PO TABS
50.0000 mg | ORAL_TABLET | Freq: Three times a day (TID) | ORAL | Status: DC
  Administered 2022-03-12: 50 mg via ORAL
  Filled 2022-03-12 (×2): qty 1

## 2022-03-12 MED ORDER — ALBUMIN HUMAN 25 % IV SOLN
12.5000 g | Freq: Once | INTRAVENOUS | Status: AC
  Administered 2022-03-12: 12.5 g via INTRAVENOUS
  Filled 2022-03-12: qty 50

## 2022-03-12 MED ORDER — ENSURE ENLIVE PO LIQD
237.0000 mL | Freq: Three times a day (TID) | ORAL | Status: DC
  Administered 2022-03-12: 237 mL via ORAL

## 2022-03-12 MED ORDER — LORAZEPAM 2 MG/ML IJ SOLN
0.5000 mg | Freq: Once | INTRAMUSCULAR | Status: AC
  Administered 2022-03-12: 0.5 mg via INTRAVENOUS
  Filled 2022-03-12: qty 1

## 2022-03-12 MED ORDER — PANTOPRAZOLE SODIUM 40 MG IV SOLR
40.0000 mg | Freq: Every day | INTRAVENOUS | Status: DC
  Administered 2022-03-12 – 2022-03-14 (×3): 40 mg via INTRAVENOUS
  Filled 2022-03-12 (×3): qty 10

## 2022-03-12 MED ORDER — PANTOPRAZOLE SODIUM 40 MG PO TBEC: 40.0000 mg | DELAYED_RELEASE_TABLET | Freq: Every day | ORAL | Status: DC

## 2022-03-12 MED ORDER — ADULT MULTIVITAMIN W/MINERALS CH: 1.0000 | ORAL_TABLET | Freq: Every day | ORAL | Status: DC

## 2022-03-12 MED ORDER — FUROSEMIDE 10 MG/ML IJ SOLN: 40.0000 mg | Freq: Two times a day (BID) | INTRAMUSCULAR | Status: DC

## 2022-03-12 MED ORDER — ACETAZOLAMIDE SODIUM 500 MG IJ SOLR
500.0000 mg | Freq: Two times a day (BID) | INTRAMUSCULAR | Status: AC
  Administered 2022-03-12 – 2022-03-13 (×2): 500 mg via INTRAVENOUS
  Filled 2022-03-12 (×2): qty 500

## 2022-03-12 MED ORDER — HYDRALAZINE HCL 20 MG/ML IJ SOLN
20.0000 mg | Freq: Four times a day (QID) | INTRAMUSCULAR | Status: DC
  Administered 2022-03-13: 20 mg via INTRAVENOUS
  Filled 2022-03-12 (×3): qty 1

## 2022-03-12 MED ORDER — FUROSEMIDE 10 MG/ML IJ SOLN
40.0000 mg | Freq: Two times a day (BID) | INTRAMUSCULAR | Status: DC
  Administered 2022-03-12: 40 mg via INTRAVENOUS
  Filled 2022-03-12: qty 4

## 2022-03-12 MED ORDER — IOHEXOL 350 MG/ML SOLN
75.0000 mL | Freq: Once | INTRAVENOUS | Status: AC | PRN
  Administered 2022-03-12: 75 mL via INTRAVENOUS

## 2022-03-12 NOTE — Progress Notes (Incomplete)
{  CHL IP ALL NUTRITION [id number]}

## 2022-03-12 NOTE — Progress Notes (Incomplete)
CROSS COVER NOTE  NAME: Stanley Mclean MRN: [id number] DOB : 1940/10/12 ATTENDING PHYSICIAN: Annita Brod, MD    Date of Service   03/12/2022   HPI/Events of Note   "his pt is currently agitated and trying to remove the BiPAP and get out of bed. He is NPO and needs to keep the BiPAP on overnight. Can we get him something PRN to help him stay calm?  He received an Ativan dose on day shift  Interventions   Assessment/Plan: Ativan 0.5 Please order PRN agent for agitation for this gentleman X        To reach the provider On-Call:   7AM- 7PM see care teams to locate the attending and reach out to them via [email or web address]. 7PM-7AM contact night-coverage If you still have difficulty reaching the appropriate provider, please page the [facility] (Director on Call) for [facility] on amion for assistance  This document was prepared using Set designer software and may include unintentional dictation errors.  Neomia Glass DNP, MBA, FNP-BC Nurse Practitioner [facility] Pager [phone]

## 2022-03-12 NOTE — Progress Notes (Signed)
[facility] Progress Note  Patient: Stanley Mclean    JXB:[id number]  DOA: 02/27/2022    Date of Service: the patient was seen and examined on 03/12/2022  Brief hospital course: 81 year old male with past medical history of CAD, hypertension, COPD on 4 L nasal cannula and recently diagnosed left upper lobe cavitary lesion with pending workup outpatient, just discharged on 12/18 who presented back to the emergency room on 12/22 with worsening hypoxia and hypercapnia with respiratory failure secondary to the flu.  Patient confused due to hypercapnia and required intubation and admitted to the ICU.  Treated for COPD exacerbation and flu and able to be extubated on 12/24.  Transferred to hospitalist service on 12/26.   Assessment and Plan: * Acute on chronic respiratory failure with hypoxia and hypercarbia (HCC) Second to COPD exacerbation from flu.  Initially improved and was at 6 L continue steroids, nebulizers, supplemental oxygen.  Started to decline on night of 12/26 likely from heart failure as well.  Requiring additional BiPAP.  Change Lasix to Diamox to avoid metabolic alkalosis  COPD exacerbation ([place]) As above.  Pneumonia of both lungs due to infectious organism On IV Rocephin.  Procalcitonin trending downward  Influenzal pneumonia Completes Tamiflu course 12/26.  AKI (acute kidney injury) ([place]) Creatinine normal at time of previous discharge.  Continue to trend upward, peaking at 2.12 on 12/24.  Improving, and will briefly added IV fluids given hypernatremia  Acute metabolic encephalopathy Secondary to hypercapnia, mostly fully resolved  Acute on chronic diastolic CHF (congestive heart failure) ([place]) Likely from fluid resuscitation on admission.  Mild.  No diuresis initially given trying to treat hypernatremia.  Now with hypoxia worse and sodium stable, have stopped this and giving Lasix.  Repeat ABG notes increasing bicarb so change Lasix to Diamox to stop respiratory  alkalosis  Hypernatremia Treating with gentle half-normal saline.  Diabetes mellitus type 2, noninsulin dependent ([place]) Covering with sliding scale, elevated also due to steroids  Cavitating mass in left upper lung lobe Discovered during previous hospitalization.  Outpatient workup.  Pulmonary feels highly suspicious for malignancy  Underweight Meets criteria with BMI of 15.99. Nutrition Status: Now that he is awake and off ventilator, can stop tube feeds and will start p.o. once speech therapy has chance to evaluate.       Body mass index is 16.51 kg/m.  Nutrition Problem: Inadequate oral intake Etiology: inability to eat Pressure Injury 02/23/21 Coccyx Mid;Upper Stage 1 -  Intact skin with non-blanchable redness of a localized area usually over a bony prominence. Red rough non blanchable skin on coccyx (Active)  02/23/21 0530  Location: Coccyx  Location Orientation: Mid;Upper  Staging: Stage 1 -  Intact skin with non-blanchable redness of a localized area usually over a bony prominence.  Wound Description (Comments): Red rough non blanchable skin on coccyx  Present on Admission: Yes     Consultants: Pulmonary  Procedures: Intubated on ventilator 12/22 - 12/24  Antimicrobials: IV Rocephin 12/22-present Tamiflu course completed  Code Status: Full code   Subjective: Patient somnolent after receiving Ativan for agitation  Objective: Vital signs were reviewed and unremarkable. Vitals:   03/12/22 1407 03/12/22 1500  BP:  (!) 154/71  Pulse:  (!) 111  Resp:  19  Temp:  98.7 F (37.1 C)  SpO2: 92% 91%    Intake/Output Summary (Last 24 hours) at 03/12/2022 1509 Last data filed at 03/12/2022 1502 Gross per 24 hour  Intake 941.07 ml  Output 925 ml  Net 16.07 ml  Filed Weights   03/09/22 0339 03/11/22 0300 03/12/22 0300  Weight: 53.6 kg 49.1 kg 50.7 kg   Body mass index is 16.51 kg/m.  Exam:  General: Somnolent HEENT: Normocephalic, atraumatic,  mucous membranes slightly dry Cardiovascular: Regular rate and rhythm, S1-S2 Respiratory: Decreased breath sounds throughout Abdomen: Soft, nontender, nondistended, positive bowel sounds Musculoskeletal: No clubbing or cyanosis or edema Skin: No skin breaks, tears or lesions Psychiatry: Appropriate, no evidence of psychoses, flattened affect Neurology: No focal deficits  Data Reviewed: Procalcitonin 0.4, BNP 236, creatinine 1.31, sodium at 147. ABG pH 7.42 with pCO2 of 62, pO2 of 74 and bicarb of 40 on high flow nasal cannula.  Disposition:  Status is: Inpatient Remains inpatient appropriate because:  -Improvement in sodium -Oxygenation back to baseline -Renal function back to baseline -Evaluation by physical therapy -Improvement in mentation    Anticipated discharge date: 1/2  Family Communication: Updating daughter by phone DVT Prophylaxis: heparin injection 5,000 Units Start: 02/17/2022 1730 SCDs Start: 03/05/2022 1715    Author: Annita Brod ,MD 03/12/2022 3:09 PM  To reach On-call, see care teams to locate the attending and reach out via [email or web address]. Between 7PM-7AM, please contact night-coverage If you still have difficulty reaching the attending provider, please page the [facility] (Director on Call) for [facility] on amion for assistance.

## 2022-03-12 NOTE — Progress Notes (Signed)
PHARMACIST - PHYSICIAN COMMUNICATION  DR:   [place] Pink  CONCERNING: IV to Oral Route Change Policy  RECOMMENDATION: This patient is receiving pantoprazole by the intravenous route.  Based on criteria approved by the Pharmacy and Therapeutics Committee, the intravenous medication(s) is/are being converted to the equivalent oral dose form(s).   DESCRIPTION: These criteria include: The patient is eating (either orally or via tube) and/or has been taking other orally administered medications for a least 24 hours The patient has no evidence of active gastrointestinal bleeding or impaired GI absorption (gastrectomy, short bowel, patient on TNA or NPO).  If you have questions about this conversion, please contact the Pharmacy Department  _0   [phone] )  [facility] _1   [phone] )  [facility] _2   [phone] )  [facility] _3   [phone] )  [facility] _4   [phone] )  [place], [facility] 03/12/2022 9:23 AM

## 2022-03-12 NOTE — Progress Notes (Signed)
SLP Cancellation Note  Patient Details Name: ANKUSH GINTZ MRN: [id number] DOB: [birth year]   Cancelled treatment:       Reason Eval/Treat Not Completed: Medical issues which prohibited therapy (Pt is currently on BiPAP) Given current respiratory needs, bedside swallow assessment held this date. Recommend pt be NPO until completion of swallow assessment. MD aware and in agreement.   [place] Tydus Sanmiguel Clapp  MS [facility] SLP   [place] J Clapp 03/12/2022, 4:27 PM

## 2022-03-13 ENCOUNTER — Inpatient Hospital Stay: Payer: PPO

## 2022-03-13 DIAGNOSIS — E43 Unspecified severe protein-calorie malnutrition: Secondary | ICD-10-CM | POA: Insufficient documentation

## 2022-03-13 DIAGNOSIS — J9601 Acute respiratory failure with hypoxia: Secondary | ICD-10-CM | POA: Diagnosis not present

## 2022-03-13 DIAGNOSIS — J9602 Acute respiratory failure with hypercapnia: Secondary | ICD-10-CM | POA: Diagnosis not present

## 2022-03-13 LAB — BLOOD GAS, ARTERIAL
Acid-Base Excess: 8.8 mmol/L — ABNORMAL HIGH (ref 0.0–2.0)
Bicarbonate: 37 mmol/L — ABNORMAL HIGH (ref 20.0–28.0)
FIO2: 100 %
MECHVT: 500 mL
O2 Saturation: 98.3 %
PEEP: 5 cmH2O
Patient temperature: 37
RATE: 16 resp/min
pCO2 arterial: 67 mmHg (ref 32–48)
pH, Arterial: 7.35 (ref 7.35–7.45)
pO2, Arterial: 95 mmHg (ref 83–108)

## 2022-03-13 LAB — CBC
HCT: 35.6 % — ABNORMAL LOW (ref 39.0–52.0)
Hemoglobin: 10.7 g/dL — ABNORMAL LOW (ref 13.0–17.0)
MCH: 30.7 pg (ref 26.0–34.0)
MCHC: 30.1 g/dL (ref 30.0–36.0)
MCV: 102.3 fL — ABNORMAL HIGH (ref 80.0–100.0)
Platelets: 258 10*3/uL (ref 150–400)
RBC: 3.48 MIL/uL — ABNORMAL LOW (ref 4.22–5.81)
RDW: 14.6 % (ref 11.5–15.5)
WBC: 9.2 10*3/uL (ref 4.0–10.5)
nRBC: 0 % (ref 0.0–0.2)

## 2022-03-13 LAB — BASIC METABOLIC PANEL
Anion gap: 9 (ref 5–15)
Anion gap: 4 — ABNORMAL LOW (ref 5–15)
BUN: 52 mg/dL — ABNORMAL HIGH (ref 8–23)
BUN: 61 mg/dL — ABNORMAL HIGH (ref 8–23)
CO2: 32 mmol/L (ref 22–32)
CO2: 34 mmol/L — ABNORMAL HIGH (ref 22–32)
Calcium: 8.9 mg/dL (ref 8.9–10.3)
Calcium: 7.9 mg/dL — ABNORMAL LOW (ref 8.9–10.3)
Chloride: 111 mmol/L (ref 98–111)
Chloride: 111 mmol/L (ref 98–111)
Creatinine, Ser: 1.5 mg/dL — ABNORMAL HIGH (ref 0.61–1.24)
Creatinine, Ser: 2.13 mg/dL — ABNORMAL HIGH (ref 0.61–1.24)
GFR, Estimated: 47 mL/min — ABNORMAL LOW (ref >60)
GFR, Estimated: 31 mL/min — ABNORMAL LOW (ref >60)
Glucose, Bld: 176 mg/dL — ABNORMAL HIGH (ref 70–99)
Glucose, Bld: 250 mg/dL — ABNORMAL HIGH (ref 70–99)
Potassium: 3.5 mmol/L (ref 3.5–5.1)
Potassium: 3.4 mmol/L — ABNORMAL LOW (ref 3.5–5.1)
Sodium: 152 mmol/L — ABNORMAL HIGH (ref 135–145)
Sodium: 149 mmol/L — ABNORMAL HIGH (ref 135–145)

## 2022-03-13 LAB — GLUCOSE, CAPILLARY
Glucose-Capillary: 166 mg/dL — ABNORMAL HIGH (ref 70–99)
Glucose-Capillary: 168 mg/dL — ABNORMAL HIGH (ref 70–99)
Glucose-Capillary: 175 mg/dL — ABNORMAL HIGH (ref 70–99)
Glucose-Capillary: 229 mg/dL — ABNORMAL HIGH (ref 70–99)
Glucose-Capillary: 219 mg/dL — ABNORMAL HIGH (ref 70–99)
Glucose-Capillary: 195 mg/dL — ABNORMAL HIGH (ref 70–99)

## 2022-03-13 LAB — LACTIC ACID, PLASMA: Lactic Acid, Venous: 1.5 mmol/L (ref 0.5–1.9)

## 2022-03-13 LAB — PROCALCITONIN: Procalcitonin: 3.79 ng/mL

## 2022-03-13 LAB — BRAIN NATRIURETIC PEPTIDE: B Natriuretic Peptide: 237.6 pg/mL — ABNORMAL HIGH (ref 0.0–100.0)

## 2022-03-13 LAB — MAGNESIUM: Magnesium: 2.3 mg/dL (ref 1.7–2.4)

## 2022-03-13 MED ORDER — ROCURONIUM BROMIDE 10 MG/ML (PF) SYRINGE
50.0000 mg | PREFILLED_SYRINGE | INTRAVENOUS | Status: AC
  Administered 2022-03-13: 50 mg via INTRAVENOUS
  Filled 2022-03-13: qty 10

## 2022-03-13 MED ORDER — AMIODARONE HCL IN DEXTROSE 360-4.14 MG/200ML-% IV SOLN
30.0000 mg/h | INTRAVENOUS | Status: DC
  Administered 2022-03-13 – 2022-03-15 (×4): 30 mg/h via INTRAVENOUS
  Filled 2022-03-13 (×4): qty 200

## 2022-03-13 MED ORDER — ALBUMIN HUMAN 25 % IV SOLN
25.0000 g | Freq: Four times a day (QID) | INTRAVENOUS | Status: AC
  Administered 2022-03-13 – 2022-03-14 (×2): 25 g via INTRAVENOUS
  Filled 2022-03-13 (×2): qty 100

## 2022-03-13 MED ORDER — FENTANYL CITRATE PF 50 MCG/ML IJ SOSY: 25.0000 ug | PREFILLED_SYRINGE | Freq: Once | INTRAMUSCULAR | Status: DC

## 2022-03-13 MED ORDER — NOREPINEPHRINE 4 MG/250ML-% IV SOLN
INTRAVENOUS | Status: AC
  Filled 2022-03-13: qty 250

## 2022-03-13 MED ORDER — ORAL CARE MOUTH RINSE
15.0000 mL | OROMUCOSAL | Status: DC
  Administered 2022-03-13 – 2022-03-18 (×61): 15 mL via OROMUCOSAL

## 2022-03-13 MED ORDER — NOREPINEPHRINE 16 MG/250ML-% IV SOLN
0.0000 ug/min | INTRAVENOUS | Status: DC
  Administered 2022-03-13: 16 ug/min via INTRAVENOUS
  Administered 2022-03-14: 7 ug/min via INTRAVENOUS
  Administered 2022-03-16 (×2): 13 ug/min via INTRAVENOUS
  Administered 2022-03-18: 8 ug/min via INTRAVENOUS
  Filled 2022-03-13 (×5): qty 250

## 2022-03-13 MED ORDER — ORAL CARE MOUTH RINSE: 15.0000 mL | OROMUCOSAL | Status: DC | PRN

## 2022-03-13 MED ORDER — CLOPIDOGREL BISULFATE 75 MG PO TABS
75.0000 mg | ORAL_TABLET | Freq: Every day | ORAL | Status: DC
  Administered 2022-03-14 – 2022-03-18 (×5): 75 mg
  Filled 2022-03-13 (×6): qty 1

## 2022-03-13 MED ORDER — SODIUM CHLORIDE 0.9 % IV SOLN: 250.0000 mL | INTRAVENOUS | Status: DC

## 2022-03-13 MED ORDER — VITAL AF 1.2 CAL PO LIQD
1000.0000 mL | ORAL | Status: DC
  Administered 2022-03-13 – 2022-03-15 (×4): 1000 mL

## 2022-03-13 MED ORDER — FENTANYL BOLUS VIA INFUSION
25.0000 ug | INTRAVENOUS | Status: DC | PRN
  Administered 2022-03-13: 25 ug via INTRAVENOUS
  Administered 2022-03-13 – 2022-03-14 (×4): 50 ug via INTRAVENOUS
  Administered 2022-03-15: 100 ug via INTRAVENOUS
  Administered 2022-03-15 – 2022-03-17 (×3): 50 ug via INTRAVENOUS

## 2022-03-13 MED ORDER — ETOMIDATE 2 MG/ML IV SOLN
20.0000 mg | INTRAVENOUS | Status: AC
  Administered 2022-03-13: 20 mg via INTRAVENOUS
  Filled 2022-03-13: qty 10

## 2022-03-13 MED ORDER — NOREPINEPHRINE 4 MG/250ML-% IV SOLN
2.0000 ug/min | INTRAVENOUS | Status: DC
  Administered 2022-03-13: 10 ug/min via INTRAVENOUS
  Filled 2022-03-13: qty 250

## 2022-03-13 MED ORDER — VASOPRESSIN 20 UNITS/100 ML INFUSION FOR SHOCK
0.0000 [IU]/min | INTRAVENOUS | Status: DC
  Administered 2022-03-13 – 2022-03-17 (×8): 0.03 [IU]/min via INTRAVENOUS
  Administered 2022-03-18: 0.02 [IU]/min via INTRAVENOUS
  Filled 2022-03-13 (×11): qty 100

## 2022-03-13 MED ORDER — ADULT MULTIVITAMIN W/MINERALS CH: 1.0000 | ORAL_TABLET | Freq: Every day | ORAL | Status: DC

## 2022-03-13 MED ORDER — LACTATED RINGERS IV SOLN: INTRAVENOUS | Status: DC

## 2022-03-13 MED ORDER — PROPOFOL 1000 MG/100ML IV EMUL
0.0000 ug/kg/min | INTRAVENOUS | Status: DC
  Administered 2022-03-13: 5 ug/kg/min via INTRAVENOUS
  Filled 2022-03-13 (×2): qty 100

## 2022-03-13 MED ORDER — AMIODARONE HCL IN DEXTROSE 360-4.14 MG/200ML-% IV SOLN
60.0000 mg/h | INTRAVENOUS | Status: AC
  Administered 2022-03-13: 60 mg/h via INTRAVENOUS
  Filled 2022-03-13: qty 200

## 2022-03-13 MED ORDER — FENTANYL 2500MCG IN NS 250ML (10MCG/ML) PREMIX INFUSION
0.0000 ug/h | INTRAVENOUS | Status: DC
  Administered 2022-03-13: 25 ug/h via INTRAVENOUS
  Administered 2022-03-14: 75 ug/h via INTRAVENOUS
  Administered 2022-03-16: 100 ug/h via INTRAVENOUS
  Administered 2022-03-17: 150 ug/h via INTRAVENOUS
  Administered 2022-03-17: 100 ug/h via INTRAVENOUS
  Administered 2022-03-18: 200 ug/h via INTRAVENOUS
  Filled 2022-03-13 (×6): qty 250

## 2022-03-13 MED ORDER — LACTATED RINGERS IV BOLUS
1000.0000 mL | Freq: Once | INTRAVENOUS | Status: AC
  Administered 2022-03-13: 1000 mL via INTRAVENOUS

## 2022-03-13 MED ORDER — POLYETHYLENE GLYCOL 3350 17 G PO PACK
17.0000 g | PACK | Freq: Every day | ORAL | Status: DC
  Administered 2022-03-13 – 2022-03-15 (×3): 17 g
  Filled 2022-03-13 (×4): qty 1

## 2022-03-13 MED ORDER — INSULIN ASPART 100 UNIT/ML IJ SOLN
0.0000 [IU] | INTRAMUSCULAR | Status: DC
  Administered 2022-03-13: 2 [IU] via SUBCUTANEOUS
  Filled 2022-03-13 (×2): qty 1

## 2022-03-13 MED ORDER — INSULIN ASPART 100 UNIT/ML IJ SOLN
0.0000 [IU] | INTRAMUSCULAR | Status: DC
  Administered 2022-03-13: 5 [IU] via SUBCUTANEOUS
  Administered 2022-03-13 – 2022-03-14 (×3): 3 [IU] via SUBCUTANEOUS
  Administered 2022-03-14 (×2): 5 [IU] via SUBCUTANEOUS
  Administered 2022-03-14: 3 [IU] via SUBCUTANEOUS
  Administered 2022-03-15: 2 [IU] via SUBCUTANEOUS
  Administered 2022-03-15 (×2): 8 [IU] via SUBCUTANEOUS
  Administered 2022-03-15: 3 [IU] via SUBCUTANEOUS
  Administered 2022-03-15: 5 [IU] via SUBCUTANEOUS
  Administered 2022-03-15: 2 [IU] via SUBCUTANEOUS
  Administered 2022-03-15: 5 [IU] via SUBCUTANEOUS
  Administered 2022-03-16 (×3): 2 [IU] via SUBCUTANEOUS
  Administered 2022-03-16 (×2): 5 [IU] via SUBCUTANEOUS
  Administered 2022-03-17: 2 [IU] via SUBCUTANEOUS
  Administered 2022-03-17: 5 [IU] via SUBCUTANEOUS
  Administered 2022-03-17: 3 [IU] via SUBCUTANEOUS
  Administered 2022-03-17 (×2): 5 [IU] via SUBCUTANEOUS
  Administered 2022-03-18: 2 [IU] via SUBCUTANEOUS
  Administered 2022-03-18: 3 [IU] via SUBCUTANEOUS
  Filled 2022-03-13 (×23): qty 1

## 2022-03-13 MED ORDER — DOCUSATE SODIUM 50 MG/5ML PO LIQD
100.0000 mg | Freq: Two times a day (BID) | ORAL | Status: DC
  Administered 2022-03-13 – 2022-03-17 (×5): 100 mg
  Filled 2022-03-13 (×6): qty 10

## 2022-03-13 MED ORDER — FENTANYL CITRATE (PF) 100 MCG/2ML IJ SOLN
100.0000 ug | INTRAMUSCULAR | Status: AC
  Administered 2022-03-13: 100 ug via INTRAVENOUS
  Filled 2022-03-13: qty 2

## 2022-03-13 MED ORDER — FREE WATER
100.0000 mL | Status: DC
  Administered 2022-03-13 – 2022-03-18 (×24): 100 mL

## 2022-03-13 MED ORDER — METOPROLOL TARTRATE 5 MG/5ML IV SOLN
5.0000 mg | Freq: Once | INTRAVENOUS | Status: AC
  Administered 2022-03-13: 5 mg via INTRAVENOUS

## 2022-03-13 NOTE — Procedures (Signed)
PROCEDURE: BRONCHOSCOPY Therapeutic Aspiration of Tracheobronchial Tree and Bronchoalveolar lavage   PROCEDURE DATE: 03/13/2022  TIME:  NAME:  Stanley Mclean  DOB:30-Jun-1940  MRN: [id number] LOC:  IC07A/IC07A-AA    HOSP DAY: _0 @ CODE STATUS:      Code Status Orders  (From admission, onward)           Start     Ordered   03/15/2022 1715  Full code  Continuous       Question:  By:  Answer:  Consent: discussion documented in EHR   02/28/2022 1715           Code Status History     Date Active Date Inactive Code Status Order ID Comments User Context   02/28/2022 1300 03/03/2022 1949 Full Code [id number]  CoxBriant Cedar, DO ED   02/22/2021 2347 02/25/2021 0054 Full Code [id number]  Athena Masse, MD ED   05/14/2016 1423 05/14/2016 2123 Full Code [id number]  Katha Cabal, MD Inpatient   03/30/2016 0341 03/31/2016 1928 Full Code [id number]  Harrie Foreman Inpatient           Indications/Preliminary Diagnosis:   Consent: (Place X beside choice/s below)  The benefits, risks and possible complications of the procedure were        explained to:  ___ patient  _x__ patient's family  ___ other:___________  who verbalized understanding and gave:  ___ verbal  __x_ written  ___ verbal and written  ___ telephone  ___ other:________ consent.      Unable to obtain consent; procedure performed on emergent basis.     Other:       PRESEDATION ASSESSMENT: History and Physical has been performed. Patient meds and allergies have been reviewed. Presedation airway examination has been performed and documented. Baseline vital signs, sedation score, oxygenation status, and cardiac rhythm were reviewed. Patient was deemed to be in satisfactory condition to undergo the procedure.    PREMEDICATIONS:  Patient on full sedation while on MV in MICU      PROCEDURE DETAILS: Timeout performed and correct patient, name, & ID confirmed. Following prep per Pulmonary policy,  appropriate sedation was administered. The Bronchoscope was inserted in to oral cavity with bite block in place. Therapeutic aspiration of Tracheobronchial tree was performed.  Airway exam proceeded with findings, technical procedures, and specimen collection as noted below. At the end of exam the scope was withdrawn without incident. Impression and Plan as noted below.           Airway Prep (Place X beside choice below)   1% Transtracheal Lidocaine Anesthetization 7 cc  x Patient prepped per Bronchoscopy Lab Policy       Insertion Route (Place X beside choice below)   Nasal   Oral  x Endotracheal Tube   Tracheostomy   INTRAPROCEDURE MEDICATIONS:  Sedative/Narcotic Amt Dose   Versed  mg   Fentanyl x mcg  Diprivan x mg       Medication Amt Dose  Medication Amt Dose  Lidocaine 1%  cc  Epinephrine 1:10,000 sol  cc  Xylocaine 4%  cc  Cocaine  cc   TECHNICAL PROCEDURES: (Place X beside choice below)   Procedures  Description    None     Electrocautery     Cryotherapy     Balloon Dilatation     Bronchography     Stent Placement   x  Therapeutic Aspiration RUL, RML,RLL, LLL, LUL    Laser/Argon Plasma    Brachytherapy Catheter  Placement    Foreign Body Removal         SPECIMENS (Sites): (Place X beside choice below)  Specimens Description   No Specimens Obtained     Washings   x Lavage LUL FOR CYTOLOGY   Biopsies    Fine Needle Aspirates    Brushings    Sputum    FINDINGS:  ESTIMATED BLOOD LOSS: none COMPLICATIONS/RESOLUTION: none      IMPRESSION:POST-PROCEDURE DX:   AWAIT CULTURE FROM BRONCHIAL WASHINGS OF BILATERAL AIRWAYS AND CYTOLOGY OF BAL FROM LEFT UPPER LOBE    RECOMMENDATION/PLAN:   PATIENT REMAINS CRITICALLY ILL IN ICU WITH INFLUENZA A VIRAL INFECTION AND MUCOPURULENT PNEUMONIA    Ottie Glazier, M.D.  Pulmonary & [place]

## 2022-03-13 NOTE — Procedures (Signed)
Central Venous Catheter Insertion Procedure Note  Stanley Mclean  [id number]  10-16-1940  Date:03/13/22  Time:3:05 PM   Provider Performing:Kainalu Heggs Micheline Chapman   Procedure: Insertion of Non-tunneled Central Venous [phone]) with US guidance ([id number])   Indication(s) Medication administration  Consent Risks of the procedure as well as the alternatives and risks of each were explained to the patient and/or caregiver.  Consent for the procedure was obtained and is signed in the bedside chart  Anesthesia Topical only with 1% lidocaine   Timeout Verified patient identification, verified procedure, site/side was marked, verified correct patient position, special equipment/implants available, medications/allergies/relevant history reviewed, required imaging and test results available.  Sterile Technique Maximal sterile technique including full sterile barrier drape, hand hygiene, sterile gown, sterile gloves, mask, hair covering, sterile ultrasound probe cover (if used).  Procedure Description Area of catheter insertion was cleaned with chlorhexidine and draped in sterile fashion.  With real-time ultrasound guidance a central venous catheter was placed into the left internal jugular vein. Nonpulsatile blood flow and easy flushing noted in all ports.  The catheter was sutured in place and sterile dressing applied.  Complications/Tolerance None; patient tolerated the procedure well. Chest X-ray is ordered to verify placement for internal jugular or subclavian cannulation.   Chest x-ray is not ordered for femoral cannulation.  EBL Minimal  Specimen(s) None  Stanley Mclean, AGNP  Pulmonary/Critical Care Pager [phone] (please enter 7 digits) PCCM Consult Pager [phone] (please enter 7 digits)

## 2022-03-13 NOTE — Progress Notes (Signed)
Progress Note   Pts respiratory status has significantly declined.  He Is currently hypoxic with O2 sats at 82 to 84% despite continuous Bipap with FiO2 _0 %.  Attempted to contact pts wife Ethin Drummond via telephone to inform her of decline in pts condition which will require mechanical intubation.  However, she did not answer the telephone left voicemail message instructing her to return my phone call.  Donell Beers, [place] Pager [phone] (please enter 7 digits) PCCM Consult Pager [phone] (please enter 7 digits)

## 2022-03-13 NOTE — Progress Notes (Signed)
RT assisted with bedside bronchoscopy. Consent was obtained and time out completed prior to beginning procedure by RN and MD. A disposable ambu scope was used for procedure. Time scop placed into airway was 1200 and time scope removed from airway was 1210. BAL was performed and cultures were sent to both micro and cytology for testing. Patient tolerated well with no complicaitons.

## 2022-03-13 NOTE — Progress Notes (Addendum)
NAME:  Stanley Mclean, MRN:  [id number], DOB:  12/23/1940, LOS: 6 ADMISSION DATE:  02/27/2022, CONSULTATION DATE:  03/09/2022 REFERRING MD:  Dr. Cherylann Banas, CHIEF COMPLAINT: Shortness of breath  Brief Pt Description / Synopsis:  81 year old male admitted with Severe Sepsis and acute on chronic hypoxic and hypercapnic respiratory failure in the setting of AECOPD due to influenza A infection, and LUL Cavitary lesion requiring intubation and mechanical ventilation.  History of Present Illness:  Stanley Mclean is a 81 year old male with a past medical history significant for COPD requiring 4 L supplemental oxygen at baseline, CAD, hypertension, diabetes mellitus, stroke who presents to [facility] ED on 03/15/2022 due to shortness of breath.  Patient currently is intubated and currently no family is available to contribute to history, therefore history obtained from chart review.  Per ED and nursing notes, the patient's family contacted EMS due to shortness of breath.  Upon EMS arrival he was noted to be hypoxic with O2 saturations of 82% on his baseline 4 L nasal cannula.  EMS noted severe wheezing with poor air movement of which she was given 125 mg Solu-Medrol, 4 g of magnesium, and 3 DuoNeb's.  Upon arrival to the ED he continued to have respiratory distress, only able to talk in single words.  Of note he was recently admitted at [facility] from 02/28/2022 through 03/03/2022 for acute metabolic encephalopathy in the setting of acute small left PCA territory stroke, multifocal pneumonia, and cavitary lesion concerning for neoplasm versus infection.  He was discharged on Augmentin and azithromycin and was to follow-up with pulmonologist Dr. Lanney Gins.  ED Course: Initial vital signs: Temperature 98, respiratory rate 23, pulse 125, blood pressure 96/65 Significant Labs: Bicarb 34, glucose 188, BUN 22, creatinine 1.24, AST 45, high-sensitivity troponin 34, lactic acid 1.8, procalcitonin 0.11, WBC 10.6, hemoglobin  11.3, hematocrit 36.8 Positive for influenza A Urinalysis negative for UTI Imaging Chest X-ray>>FINDINGS: The heart size and mediastinal contours are within normal limits. Nasogastric tube is seen in right mainstem bronchus. Endotracheal tube is in good position. Bilateral lung opacities are again noted concerning for multifocal pneumonia. The visualized skeletal structures are unremarkable CT Chest w/o Contrast>>IMPRESSION: 1. Extensive bilateral heterogeneous ground-glass and consolidative airspace opacity throughout the lungs is slightly worsened compared to prior examination. Findings are consistent with worsened multifocal infection. 2. No significant change in a serpiginous, thick-walled cavitary lesion about the peripheral left lung, the largest axial component at the posterolateral left upper lobe measuring 4.4 x 2.5 cm. 3. Endotracheal intubation. 4. Severe emphysema and diffuse bilateral bronchial wall thickening. 5. Frothy debris in the lower trachea and left mainstem bronchus. 6. Coronary artery disease. 7. Nonobstructive right nephrolithiasis. Medications Administered: 1 L normal saline bolus, IV cefepime, vancomycin, azithromycin  While in the ED his mental status worsened which he became minimally responsive and somnolent which he was subsequently intubated by the ED provider.  PCCM is asked to admit the patient for further workup and treatment.  Please see "significant hospital events" section below for full detailed hospital course.  03/12/22- patient has had intermittent episodes of desaturation requiring BIPAP and has increased thickened secretions.   03/13/22- patient intubated due to worsening hypoxemia and distress, plan for bronchoscopy today due to worsening secretions.  We called and spoke to daughter and wife to update family.   Pertinent  Medical History   Past Medical History:  Diagnosis Date   Anemia    Basal cell carcinoma    BPH (benign prostatic  hyperplasia)  CAD (coronary artery disease)    a. s/p multiple prior PCIs b. s/p DES-distal RCA 05/2013   COPD (chronic obstructive pulmonary disease) (HCC)    GERD (gastroesophageal reflux disease)    Macular degeneration    Pure hypercholesterolemia    Stroke ([place])    Tobacco abuse    Traumatic amputation of leg(s) (complete) (partial), unilateral, level not specified, without mention of complication 01/00/7121   Right Below Knee Amputation   Unspecified essential hypertension      Micro Data:  12/22: SARS-CoV-2 PCR>> negative 12/22: + Influenza A 12/22: Blood culture>>NGTD 12/22: Strep pneumo urinary antigen>>negative 12/22: Legionella urinary antigen>> 12/22: Mycoplasma pneumoniae>> 12/22: MRSA PCR>>negative 12/22: Tracheal aspirate>>not collected 12/22: Fungitell>> 12/23: Cryptococcus Antigen>> 12/23: Aspergillus>>  Antimicrobials:  Cefepime 12/22 x 1 dose Azithromycin 12/22>> Zosyn 12/22>>  Significant Hospital Events: Including procedures, antibiotic start and stop dates in addition to other pertinent events   12/22: Presented to ED, required intubation in the ED.  PCCM asked to admit 12/23: remains intubated, comfortable  12/24: On minimal vent settings. Tolerating SBT ~ EXTUBATE to BiPAP.  Interim History / Subjective:  -No significant events noted overnight -Afebrile, hemodynamically stable, on 2 mcg Levophed -On minimal vent settings ~ Tolerating SBT 5/5 ~ discussed with Dr. Genia Harold, will extubate to BiPAP initially given hx of hypercapnia -Creatinine worsened to 2.12 from 1.56, however UOP improved to 1.1L last 24 hrs (net + 3.4L), electrolytes acceptable ~ will continue to monitor  Objective   Blood pressure 91/65, pulse (!) 114, temperature 100 F (37.8 C), temperature source Axillary, resp. rate 18, height _0  (1.753 m), weight 47.1 kg, SpO2 94 %.    FiO2 (%):  [30 %-80 %] 80 %   Intake/Output Summary (Last 24 hours) at 03/13/2022 1014 Last data  filed at 03/13/2022 0935 Gross per 24 hour  Intake 19.46 ml  Output 2025 ml  Net -2005.54 ml    Filed Weights   03/11/22 0300 03/12/22 0300 03/13/22 0315  Weight: 49.1 kg 50.7 kg 47.1 kg    Examination: General: Acute on chronically ill-appearing male, laying in bed, intubated, on Precedex awake and following commands, no acute distress HENT: Atraumatic, normocephalic, neck supple, no JVD, orally intubated Lungs: Coarse breath sounds bilaterally, no wheezing noted, even, overbreathes the vent Cardiovascular: Regular rate and rhythm, S1-S2, no murmurs, rubs, gallops Abdomen: Soft, nontender, nondistended, no guarding rebound tenderness, bowel sounds positive x 4 Extremities: Normal bulk and tone, no deformities, no edema Neuro: Lightly Sedated, awake and alert, following commands, no focal deficits, pupils PERRLA GU: Foley catheter draining yellow urine  Resolved Hospital Problem list     Assessment & Plan:   Acute on chronic Hypoxic & Hypercapnic Respiratory Failure in setting of AECOPD due to influenza A infection  LUL Cavitary Lesion -Full vent support, implement lung protective strategies -Plateau pressures less than 30 cm H20 -Wean FiO2 & PEEP as tolerated to maintain O2 sats >92% -Follow intermittent Chest X-ray & ABG as needed -Spontaneous Breathing Trials when respiratory parameters met and mental status permits -Implement VAP Bundle -Bronchodilators and Pulmicort nebs -IV Solu-Medrol -Antibiotics as above  Severe Sepsis due to Influenza A infection  (Met SIRS Criteria upon admission: RR >20, HR 100) -Monitor fever curve -Trend WBC's & Procalcitonin -Follow cultures as above -Continue empiric azithromycin, Zosyn, and Tamiflu pending cultures & sensitivities  Hypotension: suspect sedation related vs sepsis Mildly elevated troponin, suspect demand ischemia PMHx: HTN, HLD, CAD s/p stent Echocardiogram 03/03/2022: LVEF 55 to 60%, grade 2  diastolic dysfunction, RV  systolic function normal -Continuous cardiac monitoring -Maintain MAP >65 -Vasopressors as needed to maintain MAP goal -Lactic acid has normalized -Trend HS Troponin until peaked (34 ~ 48 ~) -Diuresis as BP and renal function permits ~ holding due to AKI and low dose pressors  Acute Kidney Injury -Monitor I&O's / urinary output -Follow BMP -Ensure adequate renal perfusion -Avoid nephrotoxic agents as able -Replace electrolytes as indicated -Low threshold for Nephrology consultation  Anemia of chronic disease -Monitor for S/Sx of bleeding -Trend CBC -Heparin subcu for VTE Prophylaxis  -Transfuse for Hgb <7  Diabetes Mellitus Type II -CBG's q4h; Target range of 140 to 180 -SSI -Follow ICU Hypo/Hyperglycemia protocol  Acute metabolic encephalopathy, suspect CO2 narcosis Sedation needs in the setting of mechanical ventilation PMHx: small left PCA territory stroke  -Treatment of hypercapnia and metabolic derangements as outlined above -Maintain a RASS goal of 0 to -1 -Propofol as needed to maintain RASS goal -Avoid sedating medications as able -Daily wake up assessment -Aspirin and Plavix      Patient is critically ill with multiple chronic comorbidities.  Prognosis is guarded.  Recommend DNR status.   Best Practice (right click and "Reselect all SmartList Selections" daily)   Diet/type: NPO, tube feeds DVT prophylaxis: prophylactic heparin  GI prophylaxis: PPI Lines: N/A Foley:  Yes, and it is still needed Code Status:  full code Last date of multidisciplinary goals of care discussion [12/24]  12/24: Will update pt's family when they arrive at bedside.  Labs   CBC: Recent Labs  Lab 02/14/2022 1529 03/08/22 0445 03/09/22 0329 03/10/22 0359 03/11/22 0541 03/13/22 0433  WBC 10.6* 10.8* 14.2* 8.9 6.9 9.2  NEUTROABS 8.9*  --   --   --   --   --   HGB 11.3* 8.8* 9.5* 10.0* 10.0* 10.7*  HCT 36.8* 27.9* 30.0* 32.5* 32.4* 35.6*  MCV 99.2 97.2 96.8 97.6 99.1  102.3*  PLT 231 202 251 195 189 258     Basic Metabolic Panel: Recent Labs  Lab 03/08/22 0445 03/08/22 0704 03/09/22 0329 03/10/22 0359 03/11/22 0541 03/12/22 0432 03/13/22 0433  NA 139  --  141 150* 152* 147* 152*  K 4.3  --  4.0 4.3 4.1 4.3 3.5  CL 99  --  102 109 110 108 111  CO2 31  --  30 33* 31 29 32  GLUCOSE 234*  --  202* 147* 125* 251* 176*  BUN 32*  --  48* 48* 52* 56* 52*  CREATININE 1.56*  --  2.12* 1.69* 1.49* 1.31* 1.50*  CALCIUM 8.4*  --  8.4* 8.5* 8.7* 8.5* 8.9  MG  --    < > 2.7* 2.5* 2.5* 2.3 2.3  PHOS 4.4  --  4.6 5.1* 3.7  --   --    < > = values in this interval not displayed.    GFR: Estimated Creatinine Clearance: 26.6 mL/min (A) (by C-G formula based on SCr of 1.5 mg/dL (H)). Recent Labs  Lab 02/26/2022 1529 02/28/2022 2212 03/08/22 0445 03/08/22 0704 03/08/22 0953 03/09/22 0329 03/10/22 0359 03/11/22 0541 03/12/22 0432 03/13/22 0433  PROCALCITON 0.11  --  2.55  --   --  2.71  --   --  0.40  --   WBC 10.6*  --  10.8*  --   --  14.2* 8.9 6.9  --  9.2  LATICACIDVEN 1.8 2.6*  --  1.1 1.2  --   --   --   --   --  Liver Function Tests: Recent Labs  Lab 03/08/2022 1529 03/08/22 0445 03/09/22 0329 03/10/22 0359  AST 45*  --   --   --   ALT 39  --   --   --   ALKPHOS 96  --   --   --   BILITOT 0.9  --   --   --   PROT 7.5  --   --   --   ALBUMIN 3.7 2.8* 2.7* 2.7*    No results for input(s): "LIPASE", "AMYLASE" in the last 168 hours. No results for input(s): "AMMONIA" in the last 168 hours.  ABG    Component Value Date/Time   PHART 7.35 03/13/2022 0910   PCO2ART 67 (HH) 03/13/2022 0910   PO2ART 95 03/13/2022 0910   HCO3 37.0 (H) 03/13/2022 0910   O2SAT 98.3 03/13/2022 0910     Coagulation Profile: Recent Labs  Lab 03/14/2022 1529  INR 1.0     Cardiac Enzymes: No results for input(s): "CKTOTAL", "CKMB", "CKMBINDEX", "TROPONINI" in the last 168 hours.  HbA1C: Hemoglobin A1C  Date/Time Value Ref Range Status   07/07/2017 12:00 AM 8.2  Final  06/18/2016 03:18 PM 7.9  Final   Hgb A1c MFr Bld  Date/Time Value Ref Range Status  03/08/2022 04:45 AM 8.9 (H) 4.8 - 5.6 % Final    Comment:    (NOTE)         Prediabetes: 5.7 - 6.4         Diabetes: >6.4         Glycemic control for adults with diabetes: <7.0   03/01/2022 06:45 AM 9.0 (H) 4.8 - 5.6 % Final    Comment:    (NOTE)         Prediabetes: 5.7 - 6.4         Diabetes: >6.4         Glycemic control for adults with diabetes: <7.0     CBG: Recent Labs  Lab 03/12/22 1636 03/12/22 1932 03/12/22 2315 03/13/22 0319 03/13/22 0811  GLUCAP 206* 201* 184* 166* 168*     Review of Systems:   Unable to assess due to intubation/sedation/critical illness   Past Medical History:  He,  has a past medical history of Anemia, Basal cell carcinoma, BPH (benign prostatic hyperplasia), CAD (coronary artery disease), COPD (chronic obstructive pulmonary disease) ([place]), GERD (gastroesophageal reflux disease), Macular degeneration, Pure hypercholesterolemia, Stroke ([place]), Tobacco abuse, Traumatic amputation of leg(s) (complete) (partial), unilateral, level not specified, without mention of complication (54/65/6812), and Unspecified essential hypertension.   Surgical History:   Past Surgical History:  Procedure Laterality Date   AMPUTATION Right 08/26/2011   Below Knee Amputee   APPENDECTOMY     CARDIAC CATHETERIZATION     CAROTID ANGIOGRAPHY Right 05/14/2016   Procedure: Carotid Angiography;  Surgeon: Katha Cabal, MD;  Location: [facility];  Service: Cardiovascular;  Laterality: Right;   CAROTID PTA/STENT INTERVENTION  05/14/2016   Procedure: Carotid PTA/Stent Intervention;  Surgeon: Katha Cabal, MD;  Location: [facility];  Service: Cardiovascular;;   CORONARY ANGIOPLASTY WITH STENT PLACEMENT  09/28/2006   RCA   CORONARY ANGIOPLASTY WITH STENT PLACEMENT  07/21/2006   Mid LAD   CORONARY ANGIOPLASTY WITH STENT PLACEMENT   06/06/2013   30% prox LAD at the site of a prior stent, 40% mid LAD at the origin of D1, 40% ostial D1, 30% prox LCx at the site of a prior stent, in the proximal third of the vessel  segment, 20% prox RCA, 99% distal RCA s/p DES. EF >55%.     Social History:   reports that he has been smoking cigarettes. He has a 60.00 pack-year smoking history. He has never used smokeless tobacco. He reports that he does not drink alcohol and does not use drugs.   Family History:  His family history includes Mental illness in his mother.   Allergies No Known Allergies   Home Medications  Prior to Admission medications   Medication Sig Start Date End Date Taking? Authorizing Provider  albuterol (VENTOLIN HFA) 108 (90 Base) MCG/ACT inhaler Inhale 2 puffs into the lungs every 6 (six) hours as needed for wheezing or shortness of breath. 03/03/22 06/01/22  Fritzi Mandes, MD  amLODipine (NORVASC) 10 MG tablet TAKE ONE-HALF TABLET BY MOUTH EVERY DAY FOR BLOOD PRESSURE 11/26/21   [provider]  amoxicillin-clavulanate (AUGMENTIN) 875-125 MG tablet Take 1 tablet by mouth every 12 (twelve) hours for 6 days. 03/03/22 03/09/22  Fritzi Mandes, MD  aspirin EC 81 MG tablet Take 1 tablet (81 mg total) by mouth daily. 12/01/18   Minna Merritts, MD  atorvastatin (LIPITOR) 40 MG tablet TAKE 1 TABLET BY MOUTH EVERY DAY 10/06/18   Karamalegos, Devonne Doughty, DO  azithromycin (ZITHROMAX) 500 MG tablet Take 1 tablet (500 mg total) by mouth daily for 3 days. 03/04/22 03/04/2022  Fritzi Mandes, MD  Budeson-Glycopyrrol-Formoterol (BREZTRI AEROSPHERE) 160-9-4.8 MCG/ACT AERO Inhale 2 puffs into the lungs in the morning and at bedtime. 04/08/21   Tyler Pita, MD  Cholecalciferol 50 MCG (2000 UT) TABS Take 1 tablet by mouth daily. 08/15/21   [provider]  clopidogrel (PLAVIX) 75 MG tablet Take 1 tablet (75 mg total) by mouth daily for 21 days. 03/04/22 03/25/22  Fritzi Mandes, MD  furosemide (LASIX) 20 MG tablet Take 1 tablet  (20 mg total) by mouth daily as needed. 07/30/21   Minna Merritts, MD  ipratropium (ATROVENT) 0.06 % nasal spray Place 2 sprays into both nostrils 4 (four) times daily. 04/08/21   Tyler Pita, MD  ipratropium-albuterol (DUONEB) 0.5-2.5 (3) MG/3ML SOLN USE 1 NEBULIZER SOLUTION (3ML) BY ORAL INHALATION EVERY 6 HOURS AS NEEDED FOR SHORTNESS OF BREATH 09/23/21   [provider]  ketoconazole (NIZORAL) 2 % cream APPLY SUFFICIENT AMOUNT TOPICALLY TWO TIMES A DAY TO RASH ON FOOT. USE FOR 2 WEEKS, THEN DISCONTINUE 11/26/21   [provider]  lisinopril (ZESTRIL) 40 MG tablet TAKE ONE-HALF TABLET BY MOUTH EVERY DAY FOR BLOOD PRESSURE 05/14/21   [provider]  metFORMIN (GLUCOPHAGE) 500 MG tablet Take 1,000 mg by mouth 2 (two) times daily with a meal.    [provider]  metoprolol tartrate (LOPRESSOR) 50 MG tablet TAKE 1 TABLET BY MOUTH TWICE A DAY 10/30/21   Gollan, Kathlene November, MD  nitroGLYCERIN (NITROSTAT) 0.4 MG SL tablet Place 1 tablet (0.4 mg total) under the tongue every 5 (five) minutes as needed for chest pain. 10/13/17   Minna Merritts, MD  predniSONE (DELTASONE) 5 MG tablet Take 1 tablet by mouth daily. 11/26/21   [provider]  senna-docusate (SENOKOT-S) 8.6-50 MG tablet Take 1 tablet by mouth 2 (two) times daily. 03/03/22   Fritzi Mandes, MD  tamsulosin (FLOMAX) 0.4 MG CAPS capsule TAKE ONE CAPSULE BY MOUTH EVERY EVENING TO REPLACE TERAZOSIN 11/15/21   [provider]     Critical care provider statement:   Total critical care time: 33 minutes   Performed by: Lanney Gins MD  Critical care time was exclusive of separately billable procedures and treating other patients.   Critical care was necessary to treat or prevent imminent or life-threatening deterioration.   Critical care was time spent personally by me on the following activities: development of treatment plan with patient and/or surrogate as well as nursing, discussions with  consultants, evaluation of patient's response to treatment, examination of patient, obtaining history from patient or surrogate, ordering and performing treatments and interventions, ordering and review of laboratory studies, ordering and review of radiographic studies, pulse oximetry and re-evaluation of patient's condition.    Ottie Glazier, M.D.  Pulmonary & Critical Care Medicine

## 2022-03-13 NOTE — Consult Note (Signed)
PHARMACY CONSULT NOTE - ELECTROLYTES  Pharmacy Consult for Electrolyte Monitoring and Replacement   Recent Labs: Potassium (mmol/L)  Date Value  03/13/2022 3.5  06/07/2013 3.1 (L)   Magnesium (mg/dL)  Date Value  03/13/2022 2.3   Calcium (mg/dL)  Date Value  03/13/2022 8.9   Calcium, Total (mg/dL)  Date Value  06/07/2013 8.5   Albumin (g/dL)  Date Value  03/10/2022 2.7 (L)  12/29/2013 4.7  06/01/2013 4.0   Phosphorus (mg/dL)  Date Value  03/11/2022 3.7   Sodium  Date Value  03/13/2022 152 mmol/L (H)  07/07/2017 140  06/07/2013 141 mmol/L   Corrected Ca: 9.9 mg/dL  Assessment  Stanley Mclean is a 81 y.o. male presenting with pneumonia. PMH significant for CAD, HTN, COPD, and LUL cavitary lesion. Pharmacy has been consulted to monitor and replace electrolytes.  Diet: NPO  Goal of Therapy: Electrolytes WNL  Plan:  Potassium: 4.3 >> 3.5, no replacement needed Magnesium: 2.3 >> 2.3, no replacement needed  Phosphorus: 3.7, no replacement needed  Check BMP, Phos, Mg with AM labs  Thank you for allowing pharmacy to be a part of this patient's care.  Gretel Acre, PharmD PGY1 Pharmacy Resident 03/13/2022 12:02 PM

## 2022-03-13 NOTE — Procedures (Signed)
Endotracheal Intubation: Patient required placement of an artificial airway secondary to acute hypoxic respiratory failure    Consent: Emergent.    Hand washing performed prior to starting the procedure.    Medications administered for sedation prior to procedure:  Fentanyl 100 mcg IV, 20 mg Etomidate IV, Rocuronium 50 mg IV     A time out procedure was called and correct patient, name, & ID confirmed. Needed supplies and equipment were assembled and checked to include ETT, 10 ml syringe, Glidescope, Mac and Miller blades, suction, oxygen and bag mask valve, end tidal CO2 monitor.    Patient was positioned to align the mouth and pharynx to facilitate visualization of the glottis.    Heart rate, SpO2 and blood pressure was continuously monitored during the procedure. Pre-oxygenation was conducted prior to intubation and endotracheal tube was placed through the vocal cords into the trachea.       The artificial airway was placed under direct visualization via glidescope route using a 8 cm  ETT on the first attempt.   ETT was secured at 26 cm .   Placement was confirmed by auscuitation of lungs with good breath sounds bilaterally and no stomach sounds.  Condensation was noted on endotracheal tube.   Pulse ox   CO2 detector in place with appropriate color change.    Complications: None .        Chest radiograph ordered and pending.   Donell Beers, [place] Pager [phone] (please enter 7 digits) PCCM Consult Pager [phone] (please enter 7 digits)

## 2022-03-13 NOTE — Progress Notes (Signed)
NAME:  Stanley Mclean, MRN:  [id number], DOB:  1940-04-07, LOS: 6 ADMISSION DATE:  02/22/2022, CONSULTATION DATE:  03/14/2022 REFERRING MD:  Dr. Cherylann Banas, CHIEF COMPLAINT: Shortness of breath  Brief Pt Description / Synopsis:  81 year old male admitted with Severe Sepsis and acute on chronic hypoxic and hypercapnic respiratory failure in the setting of AECOPD due to influenza A infection, and LUL Cavitary lesion requiring intubation and mechanical ventilation.  History of Present Illness:  Stanley Mclean is a 81 year old male with a past medical history significant for COPD requiring 4 L supplemental oxygen at baseline, CAD, hypertension, diabetes mellitus, stroke who presents to [facility] ED on 03/01/2022 due to shortness of breath.  Patient currently is intubated and currently no family is available to contribute to history, therefore history obtained from chart review.  Per ED and nursing notes, the patient's family contacted EMS due to shortness of breath.  Upon EMS arrival he was noted to be hypoxic with O2 saturations of 82% on his baseline 4 L nasal cannula.  EMS noted severe wheezing with poor air movement of which she was given 125 mg Solu-Medrol, 4 g of magnesium, and 3 DuoNeb's.  Upon arrival to the ED he continued to have respiratory distress, only able to talk in single words.  Of note he was recently admitted at [facility] from 02/28/2022 through 03/03/2022 for acute metabolic encephalopathy in the setting of acute small left PCA territory stroke, multifocal pneumonia, and cavitary lesion concerning for neoplasm versus infection.  He was discharged on Augmentin and azithromycin and was to follow-up with pulmonologist Dr. Lanney Gins.  ED Course: Initial vital signs: Temperature 98, respiratory rate 23, pulse 125, blood pressure 96/65 Significant Labs: Bicarb 34, glucose 188, BUN 22, creatinine 1.24, AST 45, high-sensitivity troponin 34, lactic acid 1.8, procalcitonin 0.11, WBC 10.6, hemoglobin  11.3, hematocrit 36.8 Positive for influenza A Urinalysis negative for UTI Imaging Chest X-ray>>FINDINGS: The heart size and mediastinal contours are within normal limits. Nasogastric tube is seen in right mainstem bronchus. Endotracheal tube is in good position. Bilateral lung opacities are again noted concerning for multifocal pneumonia. The visualized skeletal structures are unremarkable CT Chest w/o Contrast>>IMPRESSION: 1. Extensive bilateral heterogeneous ground-glass and consolidative airspace opacity throughout the lungs is slightly worsened compared to prior examination. Findings are consistent with worsened multifocal infection. 2. No significant change in a serpiginous, thick-walled cavitary lesion about the peripheral left lung, the largest axial component at the posterolateral left upper lobe measuring 4.4 x 2.5 cm. 3. Endotracheal intubation. 4. Severe emphysema and diffuse bilateral bronchial wall thickening. 5. Frothy debris in the lower trachea and left mainstem bronchus. 6. Coronary artery disease. 7. Nonobstructive right nephrolithiasis. Medications Administered: 1 L normal saline bolus, IV cefepime, vancomycin, azithromycin  While in the ED his mental status worsened which he became minimally responsive and somnolent which he was subsequently intubated by the ED provider.  PCCM is asked to admit the patient for further workup and treatment.  Please see "significant hospital events" section below for full detailed hospital course.  03/12/22- patient has had intermittent episodes of desaturation requiring BIPAP and has increased thickened secretions.    Pertinent  Medical History   Past Medical History:  Diagnosis Date   Anemia    Basal cell carcinoma    BPH (benign prostatic hyperplasia)    CAD (coronary artery disease)    a. s/p multiple prior PCIs b. s/p DES-distal RCA 05/2013   COPD (chronic obstructive pulmonary disease) (HCC)    GERD (gastroesophageal  reflux disease)    Macular degeneration    Pure hypercholesterolemia    Stroke [facility])    Tobacco abuse    Traumatic amputation of leg(s) (complete) (partial), unilateral, level not specified, without mention of complication 00/17/4944   Right Below Knee Amputation   Unspecified essential hypertension      Micro Data:  12/22: SARS-CoV-2 PCR>> negative 12/22: + Influenza A 12/22: Blood culture>>NGTD 12/22: Strep pneumo urinary antigen>>negative 12/22: Legionella urinary antigen>> 12/22: Mycoplasma pneumoniae>> 12/22: MRSA PCR>>negative 12/22: Tracheal aspirate>>not collected 12/22: Fungitell>> 12/23: Cryptococcus Antigen>> 12/23: Aspergillus>>  Antimicrobials:  Cefepime 12/22 x 1 dose Azithromycin 12/22>> Zosyn 12/22>>  Significant Hospital Events: Including procedures, antibiotic start and stop dates in addition to other pertinent events   12/22: Presented to ED, required intubation in the ED.  PCCM asked to admit 12/23: remains intubated, comfortable  12/24: On minimal vent settings. Tolerating SBT ~ EXTUBATE to BiPAP.  Interim History / Subjective:  -No significant events noted overnight -Afebrile, hemodynamically stable, on 2 mcg Levophed -On minimal vent settings ~ Tolerating SBT 5/5 ~ discussed with Dr. Genia Harold, will extubate to BiPAP initially given hx of hypercapnia -Creatinine worsened to 2.12 from 1.56, however UOP improved to 1.1L last 24 hrs (net + 3.4L), electrolytes acceptable ~ will continue to monitor  Objective   Blood pressure 91/65, pulse (!) 114, temperature 100 F (37.8 C), temperature source Axillary, resp. rate 18, height _0  (1.753 m), weight 47.1 kg, SpO2 94 %.    FiO2 (%):  [30 %-80 %] 80 %   Intake/Output Summary (Last 24 hours) at 03/13/2022 1016 Last data filed at 03/13/2022 0935 Gross per 24 hour  Intake 19.46 ml  Output 2025 ml  Net -2005.54 ml    Filed Weights   03/11/22 0300 03/12/22 0300 03/13/22 0315  Weight: 49.1 kg 50.7 kg  47.1 kg    Examination: General: Acute on chronically ill-appearing male, laying in bed, intubated, on Precedex awake and following commands, no acute distress HENT: Atraumatic, normocephalic, neck supple, no JVD, orally intubated Lungs: Coarse breath sounds bilaterally, no wheezing noted, even, overbreathes the vent Cardiovascular: Regular rate and rhythm, S1-S2, no murmurs, rubs, gallops Abdomen: Soft, nontender, nondistended, no guarding rebound tenderness, bowel sounds positive x 4 Extremities: Normal bulk and tone, no deformities, no edema Neuro: Lightly Sedated, awake and alert, following commands, no focal deficits, pupils PERRLA GU: Foley catheter draining yellow urine  Resolved Hospital Problem list     Assessment & Plan:   Acute on chronic Hypoxic & Hypercapnic Respiratory Failure in setting of AECOPD due to influenza A infection  LUL Cavitary Lesion -Full vent support, implement lung protective strategies -Plateau pressures less than 30 cm H20 -Wean FiO2 & PEEP as tolerated to maintain O2 sats >92% -Follow intermittent Chest X-ray & ABG as needed -Spontaneous Breathing Trials when respiratory parameters met and mental status permits -Implement VAP Bundle -Bronchodilators and Pulmicort nebs -IV Solu-Medrol -Antibiotics as above  Severe Sepsis due to Influenza A infection  (Met SIRS Criteria upon admission: RR >20, HR 100) -Monitor fever curve -Trend WBC's & Procalcitonin -Follow cultures as above -Continue empiric azithromycin, Zosyn, and Tamiflu pending cultures & sensitivities  Hypotension: suspect sedation related vs sepsis Mildly elevated troponin, suspect demand ischemia PMHx: HTN, HLD, CAD s/p stent Echocardiogram 03/03/2022: LVEF 55 to 96%, grade 2 diastolic dysfunction, RV systolic function normal -Continuous cardiac monitoring -Maintain MAP >65 -Vasopressors as needed to maintain MAP goal -Lactic acid has normalized -Trend HS Troponin until peaked (34 ~  48 ~) -Diuresis as BP and renal function permits ~ holding due to AKI and low dose pressors  Acute Kidney Injury -Monitor I&O's / urinary output -Follow BMP -Ensure adequate renal perfusion -Avoid nephrotoxic agents as able -Replace electrolytes as indicated -Low threshold for Nephrology consultation  Anemia of chronic disease -Monitor for S/Sx of bleeding -Trend CBC -Heparin subcu for VTE Prophylaxis  -Transfuse for Hgb <7  Diabetes Mellitus Type II -CBG's q4h; Target range of 140 to 180 -SSI -Follow ICU Hypo/Hyperglycemia protocol  Acute metabolic encephalopathy, suspect CO2 narcosis Sedation needs in the setting of mechanical ventilation PMHx: small left PCA territory stroke  -Treatment of hypercapnia and metabolic derangements as outlined above -Maintain a RASS goal of 0 to -1 -Propofol as needed to maintain RASS goal -Avoid sedating medications as able -Daily wake up assessment -Aspirin and Plavix      Patient is critically ill with multiple chronic comorbidities.  Prognosis is guarded.  Recommend DNR status.   Best Practice (right click and "Reselect all SmartList Selections" daily)   Diet/type: NPO, tube feeds DVT prophylaxis: prophylactic heparin  GI prophylaxis: PPI Lines: N/A Foley:  Yes, and it is still needed Code Status:  full code Last date of multidisciplinary goals of care discussion [12/24]  12/24: Will update pt's family when they arrive at bedside.  Labs   CBC: Recent Labs  Lab 03/16/2022 1529 03/08/22 0445 03/09/22 0329 03/10/22 0359 03/11/22 0541 03/13/22 0433  WBC 10.6* 10.8* 14.2* 8.9 6.9 9.2  NEUTROABS 8.9*  --   --   --   --   --   HGB 11.3* 8.8* 9.5* 10.0* 10.0* 10.7*  HCT 36.8* 27.9* 30.0* 32.5* 32.4* 35.6*  MCV 99.2 97.2 96.8 97.6 99.1 102.3*  PLT 231 202 251 195 189 258     Basic Metabolic Panel: Recent Labs  Lab 03/08/22 0445 03/08/22 0704 03/09/22 0329 03/10/22 0359 03/11/22 0541 03/12/22 0432 03/13/22 0433   NA 139  --  141 150* 152* 147* 152*  K 4.3  --  4.0 4.3 4.1 4.3 3.5  CL 99  --  102 109 110 108 111  CO2 31  --  30 33* 31 29 32  GLUCOSE 234*  --  202* 147* 125* 251* 176*  BUN 32*  --  48* 48* 52* 56* 52*  CREATININE 1.56*  --  2.12* 1.69* 1.49* 1.31* 1.50*  CALCIUM 8.4*  --  8.4* 8.5* 8.7* 8.5* 8.9  MG  --    < > 2.7* 2.5* 2.5* 2.3 2.3  PHOS 4.4  --  4.6 5.1* 3.7  --   --    < > = values in this interval not displayed.    GFR: Estimated Creatinine Clearance: 26.6 mL/min (A) (by C-G formula based on SCr of 1.5 mg/dL (H)). Recent Labs  Lab 03/05/2022 1529 03/09/2022 2212 03/08/22 0445 03/08/22 0704 03/08/22 0953 03/09/22 0329 03/10/22 0359 03/11/22 0541 03/12/22 0432 03/13/22 0433  PROCALCITON 0.11  --  2.55  --   --  2.71  --   --  0.40  --   WBC 10.6*  --  10.8*  --   --  14.2* 8.9 6.9  --  9.2  LATICACIDVEN 1.8 2.6*  --  1.1 1.2  --   --   --   --   --      Liver Function Tests: Recent Labs  Lab 03/16/2022 1529 03/08/22 0445 03/09/22 0329 03/10/22 0359  AST 45*  --   --   --  ALT 39  --   --   --   ALKPHOS 96  --   --   --   BILITOT 0.9  --   --   --   PROT 7.5  --   --   --   ALBUMIN 3.7 2.8* 2.7* 2.7*    No results for input(s): "LIPASE", "AMYLASE" in the last 168 hours. No results for input(s): "AMMONIA" in the last 168 hours.  ABG    Component Value Date/Time   PHART 7.35 03/13/2022 0910   PCO2ART 67 (HH) 03/13/2022 0910   PO2ART 95 03/13/2022 0910   HCO3 37.0 (H) 03/13/2022 0910   O2SAT 98.3 03/13/2022 0910     Coagulation Profile: Recent Labs  Lab 03/08/2022 1529  INR 1.0     Cardiac Enzymes: No results for input(s): "CKTOTAL", "CKMB", "CKMBINDEX", "TROPONINI" in the last 168 hours.  HbA1C: Hemoglobin A1C  Date/Time Value Ref Range Status  07/07/2017 12:00 AM 8.2  Final  06/18/2016 03:18 PM 7.9  Final   Hgb A1c MFr Bld  Date/Time Value Ref Range Status  03/08/2022 04:45 AM 8.9 (H) 4.8 - 5.6 % Final    Comment:    (NOTE)          Prediabetes: 5.7 - 6.4         Diabetes: >6.4         Glycemic control for adults with diabetes: <7.0   03/01/2022 06:45 AM 9.0 (H) 4.8 - 5.6 % Final    Comment:    (NOTE)         Prediabetes: 5.7 - 6.4         Diabetes: >6.4         Glycemic control for adults with diabetes: <7.0     CBG: Recent Labs  Lab 03/12/22 1636 03/12/22 1932 03/12/22 2315 03/13/22 0319 03/13/22 0811  GLUCAP 206* 201* 184* 166* 168*     Review of Systems:   Unable to assess due to intubation/sedation/critical illness   Past Medical History:  He,  has a past medical history of Anemia, Basal cell carcinoma, BPH (benign prostatic hyperplasia), CAD (coronary artery disease), COPD (chronic obstructive pulmonary disease) ([place]), GERD (gastroesophageal reflux disease), Macular degeneration, Pure hypercholesterolemia, Stroke ([place]), Tobacco abuse, Traumatic amputation of leg(s) (complete) (partial), unilateral, level not specified, without mention of complication (06/05/2246), and Unspecified essential hypertension.   Surgical History:   Past Surgical History:  Procedure Laterality Date   AMPUTATION Right 08/26/2011   Below Knee Amputee   APPENDECTOMY     CARDIAC CATHETERIZATION     CAROTID ANGIOGRAPHY Right 05/14/2016   Procedure: Carotid Angiography;  Surgeon: Katha Cabal, MD;  Location: [place] [facility];  Service: Cardiovascular;  Laterality: Right;   CAROTID PTA/STENT INTERVENTION  05/14/2016   Procedure: Carotid PTA/Stent Intervention;  Surgeon: Katha Cabal, MD;  Location: [place] [facility];  Service: Cardiovascular;;   CORONARY ANGIOPLASTY WITH STENT PLACEMENT  09/28/2006   RCA   CORONARY ANGIOPLASTY WITH STENT PLACEMENT  07/21/2006   Mid LAD   CORONARY ANGIOPLASTY WITH STENT PLACEMENT  06/06/2013   30% prox LAD at the site of a prior stent, 40% mid LAD at the origin of D1, 40% ostial D1, 30% prox LCx at the site of a prior stent, in the proximal third of the vessel segment, 20% prox  RCA, 99% distal RCA s/p DES. EF >55%.     Social History:   reports that he has been smoking cigarettes. He has  a 60.00 pack-year smoking history. He has never used smokeless tobacco. He reports that he does not drink alcohol and does not use drugs.   Family History:  His family history includes Mental illness in his mother.   Allergies No Known Allergies   Home Medications  Prior to Admission medications   Medication Sig Start Date End Date Taking? Authorizing Provider  albuterol (VENTOLIN HFA) 108 (90 Base) MCG/ACT inhaler Inhale 2 puffs into the lungs every 6 (six) hours as needed for wheezing or shortness of breath. 03/03/22 06/01/22  Fritzi Mandes, MD  amLODipine (NORVASC) 10 MG tablet TAKE ONE-HALF TABLET BY MOUTH EVERY DAY FOR BLOOD PRESSURE 11/26/21   [provider]  amoxicillin-clavulanate (AUGMENTIN) 875-125 MG tablet Take 1 tablet by mouth every 12 (twelve) hours for 6 days. 03/03/22 03/09/22  Fritzi Mandes, MD  aspirin EC 81 MG tablet Take 1 tablet (81 mg total) by mouth daily. 12/01/18   Minna Merritts, MD  atorvastatin (LIPITOR) 40 MG tablet TAKE 1 TABLET BY MOUTH EVERY DAY 10/06/18   Karamalegos, Devonne Doughty, DO  azithromycin (ZITHROMAX) 500 MG tablet Take 1 tablet (500 mg total) by mouth daily for 3 days. 03/04/22 02/23/2022  Fritzi Mandes, MD  Budeson-Glycopyrrol-Formoterol (BREZTRI AEROSPHERE) 160-9-4.8 MCG/ACT AERO Inhale 2 puffs into the lungs in the morning and at bedtime. 04/08/21   Tyler Pita, MD  Cholecalciferol 50 MCG (2000 UT) TABS Take 1 tablet by mouth daily. 08/15/21   [provider]  clopidogrel (PLAVIX) 75 MG tablet Take 1 tablet (75 mg total) by mouth daily for 21 days. 03/04/22 03/25/22  Fritzi Mandes, MD  furosemide (LASIX) 20 MG tablet Take 1 tablet (20 mg total) by mouth daily as needed. 07/30/21   Minna Merritts, MD  ipratropium (ATROVENT) 0.06 % nasal spray Place 2 sprays into both nostrils 4 (four) times daily. 04/08/21   Tyler Pita, MD  ipratropium-albuterol (DUONEB) 0.5-2.5 (3) MG/3ML SOLN USE 1 NEBULIZER SOLUTION (3ML) BY ORAL INHALATION EVERY 6 HOURS AS NEEDED FOR SHORTNESS OF BREATH 09/23/21   [provider]  ketoconazole (NIZORAL) 2 % cream APPLY SUFFICIENT AMOUNT TOPICALLY TWO TIMES A DAY TO RASH ON FOOT. USE FOR 2 WEEKS, THEN DISCONTINUE 11/26/21   [provider]  lisinopril (ZESTRIL) 40 MG tablet TAKE ONE-HALF TABLET BY MOUTH EVERY DAY FOR BLOOD PRESSURE 05/14/21   [provider]  metFORMIN (GLUCOPHAGE) 500 MG tablet Take 1,000 mg by mouth 2 (two) times daily with a meal.    [provider]  metoprolol tartrate (LOPRESSOR) 50 MG tablet TAKE 1 TABLET BY MOUTH TWICE A DAY 10/30/21   Gollan, Kathlene November, MD  nitroGLYCERIN (NITROSTAT) 0.4 MG SL tablet Place 1 tablet (0.4 mg total) under the tongue every 5 (five) minutes as needed for chest pain. 10/13/17   Minna Merritts, MD  predniSONE (DELTASONE) 5 MG tablet Take 1 tablet by mouth daily. 11/26/21   [provider]  senna-docusate (SENOKOT-S) 8.6-50 MG tablet Take 1 tablet by mouth 2 (two) times daily. 03/03/22   Fritzi Mandes, MD  tamsulosin (FLOMAX) 0.4 MG CAPS capsule TAKE ONE CAPSULE BY MOUTH EVERY EVENING TO REPLACE TERAZOSIN 11/15/21   [provider]     Critical care provider statement:   Total critical care time: 33 minutes   Performed by: Lanney Gins MD   Critical care time was exclusive of separately billable procedures and treating other patients.   Critical care was necessary to treat or prevent imminent or life-threatening deterioration.  Critical care was time spent personally by me on the following activities: development of treatment plan with patient and/or surrogate as well as nursing, discussions with consultants, evaluation of patient's response to treatment, examination of patient, obtaining history from patient or surrogate, ordering and performing treatments and interventions, ordering and  review of laboratory studies, ordering and review of radiographic studies, pulse oximetry and re-evaluation of patient's condition.    Ottie Glazier, M.D.  Pulmonary & Critical Care Medicine

## 2022-03-13 NOTE — Progress Notes (Signed)
Progress Note   Spoke with pts wife Jahkari Maclin via telephone discussed pt has developed worsening acute hypoxic respiratory failure despite maximal support on continuous Bipap. Therefore, pt will require mechanical intubation.  Mrs. Montesdeoca was agreeable with intubation, and asked if I would contact her daughter Oswald Hillock following intubation.  I attempted to contact pts daughter Oswald Hillock via telephone, however she did not answer the telephone.  Voicemail message left instructing her to return my phone call.  Donell Beers, [place] Pager [phone] (please enter 7 digits) PCCM Consult Pager [phone] (please enter 7 digits) a

## 2022-03-13 NOTE — Progress Notes (Signed)
ET Tube advanced from 25cm at the lip to 27cm at the lip per MD and CXR.

## 2022-03-14 DIAGNOSIS — J9622 Acute and chronic respiratory failure with hypercapnia: Secondary | ICD-10-CM | POA: Diagnosis not present

## 2022-03-14 DIAGNOSIS — J1100 Influenza due to unidentified influenza virus with unspecified type of pneumonia: Secondary | ICD-10-CM | POA: Diagnosis not present

## 2022-03-14 DIAGNOSIS — J984 Other disorders of lung: Secondary | ICD-10-CM | POA: Diagnosis not present

## 2022-03-14 DIAGNOSIS — J441 Chronic obstructive pulmonary disease with (acute) exacerbation: Secondary | ICD-10-CM | POA: Diagnosis not present

## 2022-03-14 DIAGNOSIS — J9621 Acute and chronic respiratory failure with hypoxia: Secondary | ICD-10-CM | POA: Diagnosis not present

## 2022-03-14 DIAGNOSIS — J189 Pneumonia, unspecified organism: Secondary | ICD-10-CM | POA: Diagnosis not present

## 2022-03-14 LAB — CBC WITH DIFFERENTIAL/PLATELET
Abs Immature Granulocytes: 0.03 10*3/uL (ref 0.00–0.07)
Basophils Absolute: 0.1 10*3/uL (ref 0.0–0.1)
Basophils Relative: 1 %
Eosinophils Absolute: 0 10*3/uL (ref 0.0–0.5)
Eosinophils Relative: 0 %
HCT: 25.9 % — ABNORMAL LOW (ref 39.0–52.0)
Hemoglobin: 7.6 g/dL — ABNORMAL LOW (ref 13.0–17.0)
Immature Granulocytes: 0 %
Lymphocytes Relative: 5 %
Lymphs Abs: 0.5 10*3/uL — ABNORMAL LOW (ref 0.7–4.0)
MCH: 30.6 pg (ref 26.0–34.0)
MCHC: 29.3 g/dL — ABNORMAL LOW (ref 30.0–36.0)
MCV: 104.4 fL — ABNORMAL HIGH (ref 80.0–100.0)
Monocytes Absolute: 0.1 10*3/uL (ref 0.1–1.0)
Monocytes Relative: 2 %
Neutro Abs: 8.2 10*3/uL — ABNORMAL HIGH (ref 1.7–7.7)
Neutrophils Relative %: 92 %
Platelets: 143 10*3/uL — ABNORMAL LOW (ref 150–400)
RBC: 2.48 MIL/uL — ABNORMAL LOW (ref 4.22–5.81)
RDW: 14.6 % (ref 11.5–15.5)
WBC Morphology: INCREASED
WBC: 8.9 10*3/uL (ref 4.0–10.5)
nRBC: 0 % (ref 0.0–0.2)

## 2022-03-14 LAB — BLOOD GAS, ARTERIAL
Acid-Base Excess: 8.7 mmol/L — ABNORMAL HIGH (ref 0.0–2.0)
Bicarbonate: 37.2 mmol/L — ABNORMAL HIGH (ref 20.0–28.0)
FIO2: 80 %
MECHVT: 500 mL
O2 Saturation: 100 %
PEEP: 5 cmH2O
Patient temperature: 37
RATE: 18 resp/min
pCO2 arterial: 69 mmHg (ref 32–48)
pH, Arterial: 7.34 — ABNORMAL LOW (ref 7.35–7.45)
pO2, Arterial: 164 mmHg — ABNORMAL HIGH (ref 83–108)

## 2022-03-14 LAB — TRIGLYCERIDES: Triglycerides: 51 mg/dL (ref <150)

## 2022-03-14 LAB — GLUCOSE, CAPILLARY
Glucose-Capillary: 170 mg/dL — ABNORMAL HIGH (ref 70–99)
Glucose-Capillary: 193 mg/dL — ABNORMAL HIGH (ref 70–99)
Glucose-Capillary: 229 mg/dL — ABNORMAL HIGH (ref 70–99)
Glucose-Capillary: 175 mg/dL — ABNORMAL HIGH (ref 70–99)
Glucose-Capillary: 226 mg/dL — ABNORMAL HIGH (ref 70–99)
Glucose-Capillary: 248 mg/dL — ABNORMAL HIGH (ref 70–99)

## 2022-03-14 LAB — BASIC METABOLIC PANEL
Anion gap: 7 (ref 5–15)
BUN: 65 mg/dL — ABNORMAL HIGH (ref 8–23)
CO2: 31 mmol/L (ref 22–32)
Calcium: 8.1 mg/dL — ABNORMAL LOW (ref 8.9–10.3)
Chloride: 107 mmol/L (ref 98–111)
Creatinine, Ser: 2.34 mg/dL — ABNORMAL HIGH (ref 0.61–1.24)
GFR, Estimated: 28 mL/min — ABNORMAL LOW (ref >60)
Glucose, Bld: 184 mg/dL — ABNORMAL HIGH (ref 70–99)
Potassium: 3.6 mmol/L (ref 3.5–5.1)
Sodium: 145 mmol/L (ref 135–145)

## 2022-03-14 LAB — PHOSPHORUS: Phosphorus: 3.9 mg/dL (ref 2.5–4.6)

## 2022-03-14 LAB — CYTOLOGY - NON PAP

## 2022-03-14 LAB — MAGNESIUM: Magnesium: 2 mg/dL (ref 1.7–2.4)

## 2022-03-14 MED ORDER — PIPERACILLIN-TAZOBACTAM 3.375 G IVPB
3.3750 g | Freq: Three times a day (TID) | INTRAVENOUS | Status: DC
  Administered 2022-03-14 – 2022-03-15 (×3): 3.375 g via INTRAVENOUS
  Filled 2022-03-14 (×3): qty 50

## 2022-03-14 MED ORDER — ACETAMINOPHEN 325 MG PO TABS
650.0000 mg | ORAL_TABLET | Freq: Four times a day (QID) | ORAL | Status: DC | PRN
  Filled 2022-03-14: qty 2

## 2022-03-14 NOTE — Consult Note (Signed)
PHARMACY CONSULT NOTE - ELECTROLYTES  Pharmacy Consult for Electrolyte Monitoring and Replacement   Recent Labs: Potassium (mmol/L)  Date Value  03/14/2022 3.6  06/07/2013 3.1 (L)   Magnesium (mg/dL)  Date Value  03/14/2022 2.0   Calcium (mg/dL)  Date Value  03/14/2022 8.1 (L)   Calcium, Total (mg/dL)  Date Value  06/07/2013 8.5   Albumin (g/dL)  Date Value  03/10/2022 2.7 (L)  12/29/2013 4.7  06/01/2013 4.0   Phosphorus (mg/dL)  Date Value  03/14/2022 3.9   Sodium  Date Value  03/14/2022 145 mmol/L  07/07/2017 140  06/07/2013 141 mmol/L   Corrected Ca: 9.9 mg/dL  Assessment  Stanley Mclean is a 81 y.o. male presenting with pneumonia. PMH significant for CAD, HTN, COPD, and LUL cavitary lesion. Pharmacy has been consulted to monitor and replace electrolytes while in CCU.  Diet: Vital AF at 40 mL/hr + FWF at 100 mL q4h  MIVF: lactated ringers at 75 mL/hr  Goal of Therapy: Electrolytes WNL  Plan:  No electrolyte replacement warranted for today Check BMP, Phos, Mg with AM labs  Thank you for allowing pharmacy to be a part of this patient's care.  Vallery Sa, PharmD, BCPS 03/14/2022 7:50 AM

## 2022-03-14 NOTE — Progress Notes (Signed)
SLP Cancellation Note  Patient Details Name: Stanley Mclean MRN: [id number] DOB: Oct 06, 1940   Cancelled treatment:       Reason Eval/Treat Not Completed: Medical issues which prohibited therapy;Patient not medically ready;Patient's level of consciousness (chart reviewed)  Per chart note, patient was re-intubated on 03/13/22 due to worsening hypoxemia and respiratory distress. OGT in place.  ST services can be reconsulted when appropriate for assessment and oral intake.      Orinda Kenner, MS, CCC-SLP Speech Language Pathologist Rehab Services; [place] [phone] (ascom) Riyan Gavina 03/14/2022, 11:35 AM

## 2022-03-14 NOTE — Progress Notes (Signed)
Updated pt's daughter Armando Reichert regarding clinical course and plan of care.  All questions answered.    Darel Hong, AGACNP-BC [place] [facility] Prefer epic messenger for cross cover needs If after hours, please call E-link

## 2022-03-14 NOTE — Progress Notes (Signed)
Pharmacy Antibiotic Note  Stanley Mclean is a 81 y.o. male w/ PMH of CAD, HTN, COPD, hypercholesterolemia, R BKA, DM, CHF, GERD, macular degeneration, anemia, stroke, basal cell carcinoma and new LUL cavitary lesion pending work-up admitted on 02/15/2022 with pneumonia.  Pharmacy has been consulted for Zosyn dosing.  Plan: start  Zosyn 3.375g IV q8h (4 hour infusion). ---follow renal function for needed dose adjustments  Height: _0  (175.3 cm) Weight: 53.3 kg (117 lb 8.1 oz) IBW/kg (Calculated) : 70.7  Temp (24hrs), Avg:98.8 F (37.1 C), Min:96.8 F (36 C), Max:100 F (37.8 C)  Recent Labs  Lab 03/14/2022 1529 03/06/2022 2212 03/08/22 0445 03/08/22 0704 03/08/22 0953 03/09/22 0329 03/10/22 0359 03/11/22 0541 03/12/22 0432 03/13/22 0433 03/13/22 1559 03/13/22 1725 03/14/22 0648  WBC 10.6*  --    < >  --   --  14.2* 8.9 6.9  --  9.2  --   --  8.9  CREATININE 1.24  --    < >  --   --  2.12* 1.69* 1.49* 1.31* 1.50*  --  2.13* 2.34*  LATICACIDVEN 1.8 2.6*  --  1.1 1.2  --   --   --   --   --  1.5  --   --    < > = values in this interval not displayed.    Estimated Creatinine Clearance: 19.3 mL/min (A) (by C-G formula based on SCr of 2.34 mg/dL (H)).    No Known Allergies  Antimicrobials this admission: 12/22 azithromycin >> 12/24 12/22 Zosyn >> 12/24 12/24 ceftriaxone >> 12/26 12/29 Zosyn >>    Microbiology results: 12/22 BCx: NG final 12/28 Sputum: mod GNR (pending) 12/22 MRSA PCR: negative  Thank you for allowing pharmacy to be a part of this patient's care.  Dallie Piles 03/14/2022 8:19 AM

## 2022-03-14 NOTE — Progress Notes (Signed)
NAME:  Stanley Mclean, MRN:  [id number], DOB:  Nov 09, 1940, LOS: 7 ADMISSION DATE:  03/16/2022, CONSULTATION DATE:  03/06/2022 REFERRING MD:  Dr. Cherylann Banas, CHIEF COMPLAINT: Shortness of breath  Brief Pt Description / Synopsis:  81 year old male admitted with Severe Sepsis and acute on chronic hypoxic and hypercapnic respiratory failure in the setting of AECOPD due to influenza A infection, and LUL Cavitary lesion requiring intubation and mechanical ventilation.  History of Present Illness:  Stanley Mclean is a 81 year old male with a past medical history significant for COPD requiring 4 L supplemental oxygen at baseline, CAD, hypertension, diabetes mellitus, stroke who presents to [facility] ED on 03/10/2022 due to shortness of breath.  Patient currently is intubated and currently no family is available to contribute to history, therefore history obtained from chart review.  Per ED and nursing notes, the patient's family contacted EMS due to shortness of breath.  Upon EMS arrival he was noted to be hypoxic with O2 saturations of 82% on his baseline 4 L nasal cannula.  EMS noted severe wheezing with poor air movement of which she was given 125 mg Solu-Medrol, 4 g of magnesium, and 3 DuoNeb's.  Upon arrival to the ED he continued to have respiratory distress, only able to talk in single words.  Of note he was recently admitted at [facility] from 02/28/2022 through 03/03/2022 for acute metabolic encephalopathy in the setting of acute small left PCA territory stroke, multifocal pneumonia, and cavitary lesion concerning for neoplasm versus infection.  He was discharged on Augmentin and azithromycin and was to follow-up with pulmonologist Dr. Lanney Gins.  ED Course: Initial vital signs: Temperature 98, respiratory rate 23, pulse 125, blood pressure 96/65 Significant Labs: Bicarb 34, glucose 188, BUN 22, creatinine 1.24, AST 45, high-sensitivity troponin 34, lactic acid 1.8, procalcitonin 0.11, WBC 10.6, hemoglobin  11.3, hematocrit 36.8 Positive for influenza A Urinalysis negative for UTI Imaging Chest X-ray>>FINDINGS: The heart size and mediastinal contours are within normal limits. Nasogastric tube is seen in right mainstem bronchus. Endotracheal tube is in good position. Bilateral lung opacities are again noted concerning for multifocal pneumonia. The visualized skeletal structures are unremarkable CT Chest w/o Contrast>>IMPRESSION: 1. Extensive bilateral heterogeneous ground-glass and consolidative airspace opacity throughout the lungs is slightly worsened compared to prior examination. Findings are consistent with worsened multifocal infection. 2. No significant change in a serpiginous, thick-walled cavitary lesion about the peripheral left lung, the largest axial component at the posterolateral left upper lobe measuring 4.4 x 2.5 cm. 3. Endotracheal intubation. 4. Severe emphysema and diffuse bilateral bronchial wall thickening. 5. Frothy debris in the lower trachea and left mainstem bronchus. 6. Coronary artery disease. 7. Nonobstructive right nephrolithiasis. Medications Administered: 1 L normal saline bolus, IV cefepime, vancomycin, azithromycin  While in the ED his mental status worsened which he became minimally responsive and somnolent which he was subsequently intubated by the ED provider.  PCCM is asked to admit the patient for further workup and treatment.  Please see "significant hospital events" section below for full detailed hospital course.  03/12/22- patient has had intermittent episodes of desaturation requiring BIPAP and has increased thickened secretions.   03/13/22- patient intubated due to worsening hypoxemia and distress, plan for bronchoscopy today due to worsening secretions.  We called and spoke to daughter and wife to update family.  03/14/22- s/p BAL with GNR growing overnight, have narrowed abx to Zosyn. Procal trending up. He received 3L IVF with resultant  dilutional CBC. He's on PRVC at [phone], working on weaning vasopressors  with levophed/vasopressin, CVP is trending. Cytology BAL from LUL in process. Family meeting today.   Pertinent  Medical History   Past Medical History:  Diagnosis Date   Anemia    Basal cell carcinoma    BPH (benign prostatic hyperplasia)    CAD (coronary artery disease)    a. s/p multiple prior PCIs b. s/p DES-distal RCA 05/2013   COPD (chronic obstructive pulmonary disease) (HCC)    GERD (gastroesophageal reflux disease)    Macular degeneration    Pure hypercholesterolemia    Stroke ([place])    Tobacco abuse    Traumatic amputation of leg(s) (complete) (partial), unilateral, level not specified, without mention of complication 30/86/5784   Right Below Knee Amputation   Unspecified essential hypertension      Micro Data:  12/22: SARS-CoV-2 PCR>> negative 12/22: + Influenza A 12/22: Blood culture>>NGTD 12/22: Strep pneumo urinary antigen>>negative 12/22: Legionella urinary antigen>> 12/22: Mycoplasma pneumoniae>> 12/22: MRSA PCR>>negative 12/22: Tracheal aspirate>>not collected 12/22: Fungitell>> 12/23: Cryptococcus Antigen>> 12/23: Aspergillus>>  Antimicrobials:  Cefepime 12/22 x 1 dose Azithromycin 12/22>> Zosyn 12/22>>  Significant Hospital Events: Including procedures, antibiotic start and stop dates in addition to other pertinent events   12/22: Presented to ED, required intubation in the ED.  PCCM asked to admit 12/23: remains intubated, comfortable  12/24: On minimal vent settings. Tolerating SBT ~ EXTUBATE to BiPAP.    Objective   Blood pressure (!) 118/47, pulse 84, temperature 98.8 F (37.1 C), resp. rate (!) 37, height _0  (1.753 m), weight 53.3 kg, SpO2 99 %. CVP:  [4 mmHg-27 mmHg] 4 mmHg  Vent Mode: PRVC FiO2 (%):  [60 %-100 %] 60 % Set Rate:  [16 bmp-18 bmp] 18 bmp Vt Set:  [500 mL] 500 mL PEEP:  [5 cmH20] 5 cmH20 Plateau Pressure:  [20 cmH20-29 cmH20] 29 cmH20    Intake/Output Summary (Last 24 hours) at 03/14/2022 0841 Last data filed at 03/14/2022 0736 Gross per 24 hour  Intake 4594.36 ml  Output 340 ml  Net 4254.36 ml    Filed Weights   03/12/22 0300 03/13/22 0315 03/14/22 0451  Weight: 50.7 kg 47.1 kg 53.3 kg    Examination: General: Acute on chronically ill-appearing male, laying in bed, intubated, on Precedex awake and following commands, no acute distress HENT: Atraumatic, normocephalic, neck supple, no JVD, orally intubated Lungs: Coarse breath sounds bilaterally, no wheezing noted, even, overbreathes the vent Cardiovascular: Regular rate and rhythm, S1-S2, no murmurs, rubs, gallops Abdomen: Soft, nontender, nondistended, no guarding rebound tenderness, bowel sounds positive x 4 Extremities: Normal bulk and tone, no deformities, no edema Neuro: Lightly Sedated, awake and alert, following commands, no focal deficits, pupils PERRLA GU: Foley catheter draining yellow urine  Resolved Hospital Problem list     Assessment & Plan:   Acute on chronic Hypoxic & Hypercapnic Respiratory Failure in setting of AECOPD due to influenza A infection  LUL Cavitary Lesion -Full vent support, implement lung protective strategies -Plateau pressures less than 30 cm H20 -Wean FiO2 & PEEP as tolerated to maintain O2 sats >92% -Follow intermittent Chest X-ray & ABG as needed -Spontaneous Breathing Trials when respiratory parameters met and mental status permits -Implement VAP Bundle -Bronchodilators and Pulmicort nebs -IV Solu-Medrol has been DCD -Antibiotics -zosyn - due to GNR in BAL microbiololgy  Severe Sepsis due to Influenza A infection  (Met SIRS Criteria upon admission: RR >20, HR 100) -Monitor fever curve -Trend WBC's & Procalcitonin -Follow cultures as above -Continue empiric azithromycin, Zosyn, and Tamiflu pending cultures &  sensitivities  Hypotension: suspect sedation related vs sepsis Mildly elevated troponin, suspect demand  ischemia PMHx: HTN, HLD, CAD s/p stent Echocardiogram 03/03/2022: LVEF 55 to 86%, grade 2 diastolic dysfunction, RV systolic function normal -Continuous cardiac monitoring -Maintain MAP >65 -Vasopressors as needed to maintain MAP goal -Lactic acid has normalized -Trend HS Troponin until peaked (34 ~ 48 ~) -Diuresis as BP and renal function permits ~ holding due to AKI and low dose pressors  Acute Kidney Injury -Monitor I&O's / urinary output -Follow BMP -Ensure adequate renal perfusion -Avoid nephrotoxic agents as able -Replace electrolytes as indicated -Low threshold for Nephrology consultation  Anemia of chronic disease -Monitor for S/Sx of bleeding -Trend CBC -Heparin subcu for VTE Prophylaxis  -Transfuse for Hgb <7  Diabetes Mellitus Type II -CBG's q4h; Target range of 140 to 180 -SSI -Follow ICU Hypo/Hyperglycemia protocol  Acute metabolic encephalopathy, suspect CO2 narcosis Sedation needs in the setting of mechanical ventilation PMHx: small left PCA territory stroke  -Treatment of hypercapnia and metabolic derangements as outlined above -Maintain a RASS goal of 0 to -1 -Propofol as needed to maintain RASS goal -Avoid sedating medications as able -Daily wake up assessment -Aspirin and Plavix    Patient is critically ill with multiple chronic comorbidities.  Prognosis is guarded.  Recommend DNR status.   Best Practice (right click and "Reselect all SmartList Selections" daily)   Diet/type: NPO, tube feeds DVT prophylaxis: prophylactic heparin  GI prophylaxis: PPI Lines: N/A Foley:  Yes, and it is still needed Code Status:  full code Last date of multidisciplinary goals of care discussion [12/24]    Labs   CBC: Recent Labs  Lab 02/28/2022 1529 03/08/22 0445 03/09/22 0329 03/10/22 0359 03/11/22 0541 03/13/22 0433 03/14/22 0648  WBC 10.6*   < > 14.2* 8.9 6.9 9.2 8.9  NEUTROABS 8.9*  --   --   --   --   --  8.2*  HGB 11.3*   < > 9.5* 10.0* 10.0*  10.7* 7.6*  HCT 36.8*   < > 30.0* 32.5* 32.4* 35.6* 25.9*  MCV 99.2   < > 96.8 97.6 99.1 102.3* 104.4*  PLT 231   < > 251 195 189 258 143*   < > = values in this interval not displayed.     Basic Metabolic Panel: Recent Labs  Lab 03/08/22 0445 03/08/22 0704 03/09/22 0329 03/10/22 0359 03/11/22 0541 03/12/22 0432 03/13/22 0433 03/13/22 1725 03/14/22 0648  NA 139  --  141 150* 152* 147* 152* 149* 145  K 4.3  --  4.0 4.3 4.1 4.3 3.5 3.4* 3.6  CL 99  --  102 109 110 108 111 111 107  CO2 31  --  30 33* 31 29 32 34* 31  GLUCOSE 234*  --  202* 147* 125* 251* 176* 250* 184*  BUN 32*  --  48* 48* 52* 56* 52* 61* 65*  CREATININE 1.56*  --  2.12* 1.69* 1.49* 1.31* 1.50* 2.13* 2.34*  CALCIUM 8.4*  --  8.4* 8.5* 8.7* 8.5* 8.9 7.9* 8.1*  MG  --    < > 2.7* 2.5* 2.5* 2.3 2.3  --  2.0  PHOS 4.4  --  4.6 5.1* 3.7  --   --   --  3.9   < > = values in this interval not displayed.    GFR: Estimated Creatinine Clearance: 19.3 mL/min (A) (by C-G formula based on SCr of 2.34 mg/dL (H)). Recent Labs  Lab 03/14/2022 2212 03/08/22 0445 03/08/22 7619  03/08/22 5859 03/09/22 0329 03/10/22 0359 03/11/22 0541 03/12/22 0432 03/13/22 0433 03/13/22 1559 03/14/22 0648  PROCALCITON  --  2.55  --   --  2.71  --   --  0.40  --  3.79  --   WBC  --  10.8*  --   --  14.2* 8.9 6.9  --  9.2  --  8.9  LATICACIDVEN 2.6*  --  1.1 1.2  --   --   --   --   --  1.5  --      Liver Function Tests: Recent Labs  Lab 02/14/2022 1529 03/08/22 0445 03/09/22 0329 03/10/22 0359  AST 45*  --   --   --   ALT 39  --   --   --   ALKPHOS 96  --   --   --   BILITOT 0.9  --   --   --   PROT 7.5  --   --   --   ALBUMIN 3.7 2.8* 2.7* 2.7*    No results for input(s): "LIPASE", "AMYLASE" in the last 168 hours. No results for input(s): "AMMONIA" in the last 168 hours.  ABG    Component Value Date/Time   PHART 7.34 (L) 03/14/2022 0431   PCO2ART 69 (HH) 03/14/2022 0431   PO2ART 164 (H) 03/14/2022 0431   HCO3 37.2  (H) 03/14/2022 0431   O2SAT 100 03/14/2022 0431     Coagulation Profile: Recent Labs  Lab 03/04/2022 1529  INR 1.0     Cardiac Enzymes: No results for input(s): "CKTOTAL", "CKMB", "CKMBINDEX", "TROPONINI" in the last 168 hours.  HbA1C: Hemoglobin A1C  Date/Time Value Ref Range Status  07/07/2017 12:00 AM 8.2  Final  06/18/2016 03:18 PM 7.9  Final   Hgb A1c MFr Bld  Date/Time Value Ref Range Status  03/08/2022 04:45 AM 8.9 (H) 4.8 - 5.6 % Final    Comment:    (NOTE)         Prediabetes: 5.7 - 6.4         Diabetes: >6.4         Glycemic control for adults with diabetes: <7.0   03/01/2022 06:45 AM 9.0 (H) 4.8 - 5.6 % Final    Comment:    (NOTE)         Prediabetes: 5.7 - 6.4         Diabetes: >6.4         Glycemic control for adults with diabetes: <7.0     CBG: Recent Labs  Lab 03/13/22 1518 03/13/22 1925 03/13/22 2329 03/14/22 0347 03/14/22 0728  GLUCAP 229* 219* 195* 170* 193*     Review of Systems:   Unable to assess due to intubation/sedation/critical illness   Past Medical History:  He,  has a past medical history of Anemia, Basal cell carcinoma, BPH (benign prostatic hyperplasia), CAD (coronary artery disease), COPD (chronic obstructive pulmonary disease) ([place]), GERD (gastroesophageal reflux disease), Macular degeneration, Pure hypercholesterolemia, Stroke ([place]), Tobacco abuse, Traumatic amputation of leg(s) (complete) (partial), unilateral, level not specified, without mention of complication (29/24/4628), and Unspecified essential hypertension.   Surgical History:   Past Surgical History:  Procedure Laterality Date   AMPUTATION Right 08/26/2011   Below Knee Amputee   APPENDECTOMY     CARDIAC CATHETERIZATION     CAROTID ANGIOGRAPHY Right 05/14/2016   Procedure: Carotid Angiography;  Surgeon: Katha Cabal, MD;  Location: [place] [facility];  Service: Cardiovascular;  Laterality: Right;   CAROTID PTA/STENT  INTERVENTION  05/14/2016   Procedure:  Carotid PTA/Stent Intervention;  Surgeon: Katha Cabal, MD;  Location: [place] [facility];  Service: Cardiovascular;;   CORONARY ANGIOPLASTY WITH STENT PLACEMENT  09/28/2006   RCA   CORONARY ANGIOPLASTY WITH STENT PLACEMENT  07/21/2006   Mid LAD   CORONARY ANGIOPLASTY WITH STENT PLACEMENT  06/06/2013   30% prox LAD at the site of a prior stent, 40% mid LAD at the origin of D1, 40% ostial D1, 30% prox LCx at the site of a prior stent, in the proximal third of the vessel segment, 20% prox RCA, 99% distal RCA s/p DES. EF >55%.     Social History:   reports that he has been smoking cigarettes. He has a 60.00 pack-year smoking history. He has never used smokeless tobacco. He reports that he does not drink alcohol and does not use drugs.   Family History:  His family history includes Mental illness in his mother.   Allergies No Known Allergies   Home Medications  Prior to Admission medications   Medication Sig Start Date End Date Taking? Authorizing Provider  albuterol (VENTOLIN HFA) 108 (90 Base) MCG/ACT inhaler Inhale 2 puffs into the lungs every 6 (six) hours as needed for wheezing or shortness of breath. 03/03/22 06/01/22  Fritzi Mandes, MD  amLODipine (NORVASC) 10 MG tablet TAKE ONE-HALF TABLET BY MOUTH EVERY DAY FOR BLOOD PRESSURE 11/26/21   [provider]  amoxicillin-clavulanate (AUGMENTIN) 875-125 MG tablet Take 1 tablet by mouth every 12 (twelve) hours for 6 days. 03/03/22 03/09/22  Fritzi Mandes, MD  aspirin EC 81 MG tablet Take 1 tablet (81 mg total) by mouth daily. 12/01/18   Minna Merritts, MD  atorvastatin (LIPITOR) 40 MG tablet TAKE 1 TABLET BY MOUTH EVERY DAY 10/06/18   Karamalegos, Devonne Doughty, DO  azithromycin (ZITHROMAX) 500 MG tablet Take 1 tablet (500 mg total) by mouth daily for 3 days. 03/04/22 02/14/2022  Fritzi Mandes, MD  Budeson-Glycopyrrol-Formoterol (BREZTRI AEROSPHERE) 160-9-4.8 MCG/ACT AERO Inhale 2 puffs into the lungs in the morning and at bedtime. 04/08/21    Tyler Pita, MD  Cholecalciferol 50 MCG (2000 UT) TABS Take 1 tablet by mouth daily. 08/15/21   [provider]  clopidogrel (PLAVIX) 75 MG tablet Take 1 tablet (75 mg total) by mouth daily for 21 days. 03/04/22 03/25/22  Fritzi Mandes, MD  furosemide (LASIX) 20 MG tablet Take 1 tablet (20 mg total) by mouth daily as needed. 07/30/21   Minna Merritts, MD  ipratropium (ATROVENT) 0.06 % nasal spray Place 2 sprays into both nostrils 4 (four) times daily. 04/08/21   Tyler Pita, MD  ipratropium-albuterol (DUONEB) 0.5-2.5 (3) MG/3ML SOLN USE 1 NEBULIZER SOLUTION (3ML) BY ORAL INHALATION EVERY 6 HOURS AS NEEDED FOR SHORTNESS OF BREATH 09/23/21   [provider]  ketoconazole (NIZORAL) 2 % cream APPLY SUFFICIENT AMOUNT TOPICALLY TWO TIMES A DAY TO RASH ON FOOT. USE FOR 2 WEEKS, THEN DISCONTINUE 11/26/21   [provider]  lisinopril (ZESTRIL) 40 MG tablet TAKE ONE-HALF TABLET BY MOUTH EVERY DAY FOR BLOOD PRESSURE 05/14/21   [provider]  metFORMIN (GLUCOPHAGE) 500 MG tablet Take 1,000 mg by mouth 2 (two) times daily with a meal.    [provider]  metoprolol tartrate (LOPRESSOR) 50 MG tablet TAKE 1 TABLET BY MOUTH TWICE A DAY 10/30/21   Minna Merritts, MD  nitroGLYCERIN (NITROSTAT) 0.4 MG SL tablet Place 1 tablet (0.4 mg total) under the tongue every 5 (  five) minutes as needed for chest pain. 10/13/17   Minna Merritts, MD  predniSONE (DELTASONE) 5 MG tablet Take 1 tablet by mouth daily. 11/26/21   [provider]  senna-docusate (SENOKOT-S) 8.6-50 MG tablet Take 1 tablet by mouth 2 (two) times daily. 03/03/22   Fritzi Mandes, MD  tamsulosin (FLOMAX) 0.4 MG CAPS capsule TAKE ONE CAPSULE BY MOUTH EVERY EVENING TO REPLACE TERAZOSIN 11/15/21   [provider]     Critical care provider statement:   Total critical care time: 33 minutes   Performed by: Lanney Gins MD   Critical care time was exclusive of separately billable procedures and  treating other patients.   Critical care was necessary to treat or prevent imminent or life-threatening deterioration.   Critical care was time spent personally by me on the following activities: development of treatment plan with patient and/or surrogate as well as nursing, discussions with consultants, evaluation of patient's response to treatment, examination of patient, obtaining history from patient or surrogate, ordering and performing treatments and interventions, ordering and review of laboratory studies, ordering and review of radiographic studies, pulse oximetry and re-evaluation of patient's condition.    Ottie Glazier, M.D.  Pulmonary & Critical Care Medicine

## 2022-03-15 ENCOUNTER — Inpatient Hospital Stay: Payer: PPO

## 2022-03-15 DIAGNOSIS — J1100 Influenza due to unidentified influenza virus with unspecified type of pneumonia: Secondary | ICD-10-CM | POA: Diagnosis not present

## 2022-03-15 DIAGNOSIS — J441 Chronic obstructive pulmonary disease with (acute) exacerbation: Secondary | ICD-10-CM | POA: Diagnosis not present

## 2022-03-15 DIAGNOSIS — J189 Pneumonia, unspecified organism: Secondary | ICD-10-CM | POA: Diagnosis not present

## 2022-03-15 DIAGNOSIS — J984 Other disorders of lung: Secondary | ICD-10-CM | POA: Diagnosis not present

## 2022-03-15 DIAGNOSIS — J9622 Acute and chronic respiratory failure with hypercapnia: Secondary | ICD-10-CM | POA: Diagnosis not present

## 2022-03-15 DIAGNOSIS — J9621 Acute and chronic respiratory failure with hypoxia: Secondary | ICD-10-CM | POA: Diagnosis not present

## 2022-03-15 LAB — CBC
HCT: 24.5 % — ABNORMAL LOW (ref 39.0–52.0)
Hemoglobin: 7.4 g/dL — ABNORMAL LOW (ref 13.0–17.0)
MCH: 31.5 pg (ref 26.0–34.0)
MCHC: 30.2 g/dL (ref 30.0–36.0)
MCV: 104.3 fL — ABNORMAL HIGH (ref 80.0–100.0)
Platelets: 111 10*3/uL — ABNORMAL LOW (ref 150–400)
RBC: 2.35 MIL/uL — ABNORMAL LOW (ref 4.22–5.81)
RDW: 14.3 % (ref 11.5–15.5)
WBC: 10.8 10*3/uL — ABNORMAL HIGH (ref 4.0–10.5)
nRBC: 0 % (ref 0.0–0.2)

## 2022-03-15 LAB — RENAL FUNCTION PANEL
Albumin: 2.5 g/dL — ABNORMAL LOW (ref 3.5–5.0)
Anion gap: 9 (ref 5–15)
BUN: 73 mg/dL — ABNORMAL HIGH (ref 8–23)
CO2: 29 mmol/L (ref 22–32)
Calcium: 8 mg/dL — ABNORMAL LOW (ref 8.9–10.3)
Chloride: 104 mmol/L (ref 98–111)
Creatinine, Ser: 2.9 mg/dL — ABNORMAL HIGH (ref 0.61–1.24)
GFR, Estimated: 21 mL/min — ABNORMAL LOW (ref >60)
Glucose, Bld: 285 mg/dL — ABNORMAL HIGH (ref 70–99)
Phosphorus: 3.6 mg/dL (ref 2.5–4.6)
Potassium: 3.4 mmol/L — ABNORMAL LOW (ref 3.5–5.1)
Sodium: 142 mmol/L (ref 135–145)

## 2022-03-15 LAB — GLUCOSE, CAPILLARY
Glucose-Capillary: 157 mg/dL — ABNORMAL HIGH (ref 70–99)
Glucose-Capillary: 159 mg/dL — ABNORMAL HIGH (ref 70–99)
Glucose-Capillary: 159 mg/dL — ABNORMAL HIGH (ref 70–99)
Glucose-Capillary: 194 mg/dL — ABNORMAL HIGH (ref 70–99)
Glucose-Capillary: 248 mg/dL — ABNORMAL HIGH (ref 70–99)
Glucose-Capillary: 270 mg/dL — ABNORMAL HIGH (ref 70–99)

## 2022-03-15 LAB — FIBRINOGEN: Fibrinogen: >800 mg/dL — ABNORMAL HIGH (ref 210–475)

## 2022-03-15 LAB — PROCALCITONIN: Procalcitonin: 19.64 ng/mL

## 2022-03-15 LAB — PHOSPHORUS: Phosphorus: 3.7 mg/dL (ref 2.5–4.6)

## 2022-03-15 LAB — MAGNESIUM: Magnesium: 2 mg/dL (ref 1.7–2.4)

## 2022-03-15 MED ORDER — POTASSIUM CHLORIDE 20 MEQ PO PACK
40.0000 meq | PACK | Freq: Once | ORAL | Status: AC
  Administered 2022-03-15: 40 meq

## 2022-03-15 MED ORDER — POTASSIUM CHLORIDE 20 MEQ PO PACK
40.0000 meq | PACK | Freq: Two times a day (BID) | ORAL | Status: DC
  Filled 2022-03-15: qty 2

## 2022-03-15 MED ORDER — PIPERACILLIN-TAZOBACTAM IN DEX 2-0.25 GM/50ML IV SOLN
2.2500 g | Freq: Three times a day (TID) | INTRAVENOUS | Status: DC
  Administered 2022-03-15 – 2022-03-16 (×3): 2.25 g via INTRAVENOUS
  Filled 2022-03-15 (×4): qty 50

## 2022-03-15 MED ORDER — PANTOPRAZOLE SODIUM 40 MG IV SOLR
40.0000 mg | Freq: Two times a day (BID) | INTRAVENOUS | Status: DC
  Administered 2022-03-15 – 2022-03-18 (×7): 40 mg via INTRAVENOUS
  Filled 2022-03-15 (×7): qty 10

## 2022-03-15 MED ORDER — AMIODARONE HCL 200 MG PO TABS
200.0000 mg | ORAL_TABLET | Freq: Two times a day (BID) | ORAL | Status: DC
  Administered 2022-03-15 – 2022-03-18 (×7): 200 mg
  Filled 2022-03-15 (×7): qty 1

## 2022-03-15 MED ORDER — LEVOFLOXACIN IN D5W 750 MG/150ML IV SOLN
750.0000 mg | Freq: Once | INTRAVENOUS | Status: AC
  Administered 2022-03-15: 750 mg via INTRAVENOUS
  Filled 2022-03-15: qty 150

## 2022-03-15 NOTE — Progress Notes (Signed)
Chaplain responded to page for a request from team to visit family in waiting room. Pt was being cared for by staff, no family in waiting room at this time. They had already left. Will leave a follow up note for chaplain team to visit with this pt. Family has decided to move pt to comfort care and more family is coming in, information provided by staff.

## 2022-03-15 NOTE — Progress Notes (Signed)
PHARMACY NOTE:  ANTIMICROBIAL RENAL DOSAGE ADJUSTMENT  Current antimicrobial regimen includes a mismatch between antimicrobial dosage and estimated renal function.  As per policy approved by the Pharmacy & Therapeutics and Medical Executive Committees, the antimicrobial dosage will be adjusted accordingly.  Current antimicrobial dosage:  Zosyn 3.375 g IV q8h (4-hr infusion)  Indication: Aspiration pneumonia  Renal Function:  Estimated Creatinine Clearance: 15.7 mL/min (A) (by C-G formula based on SCr of 2.9 mg/dL (H)).    Antimicrobial dosage has been changed to:  Zosyn 2.25 g IV q8h (30-min infusion)   Thank you for allowing pharmacy to be a part of this patient's care.  Benita Gutter, [facility] 03/15/2022 7:25 AM

## 2022-03-15 NOTE — Progress Notes (Signed)
NAME:  Stanley Mclean, MRN:  [id number], DOB:  1940-11-19, LOS: 8 ADMISSION DATE:  02/17/2022, CONSULTATION DATE:  03/01/2022 REFERRING MD:  Dr. Cherylann Banas, CHIEF COMPLAINT: Shortness of breath  Brief Pt Description / Synopsis:  81 year old male admitted with Severe Sepsis and acute on chronic hypoxic and hypercapnic respiratory failure in the setting of AECOPD due to influenza A infection, and LUL Cavitary lesion requiring intubation and mechanical ventilation.  History of Present Illness:  Stanley Mclean is a 81 year old male with a past medical history significant for COPD requiring 4 L supplemental oxygen at baseline, CAD, hypertension, diabetes mellitus, stroke who presents to [facility] ED on 03/15/2022 due to shortness of breath.  Patient currently is intubated and currently no family is available to contribute to history, therefore history obtained from chart review.  Per ED and nursing notes, the patient's family contacted EMS due to shortness of breath.  Upon EMS arrival he was noted to be hypoxic with O2 saturations of 82% on his baseline 4 L nasal cannula.  EMS noted severe wheezing with poor air movement of which she was given 125 mg Solu-Medrol, 4 g of magnesium, and 3 DuoNeb's.  Upon arrival to the ED he continued to have respiratory distress, only able to talk in single words.   ED Course: Initial vital signs: Temperature 98, respiratory rate 23, pulse 125, blood pressure 96/65 Significant Labs: Bicarb 34, glucose 188, BUN 22, creatinine 1.24, AST 45, high-sensitivity troponin 34, lactic acid 1.8, procalcitonin 0.11, WBC 10.6, hemoglobin 11.3, hematocrit 36.8 Positive for influenza A Urinalysis negative for UTI Imaging Chest X-ray>>FINDINGS: The heart size and mediastinal contours are within normal limits. Nasogastric tube is seen in right mainstem bronchus. Endotracheal tube is in good position. Bilateral lung opacities are again noted concerning for multifocal pneumonia. The  visualized skeletal structures are unremarkable CT Chest w/o Contrast>>IMPRESSION: 1. Extensive bilateral heterogeneous ground-glass and consolidative airspace opacity throughout the lungs is slightly worsened compared to prior examination. Findings are consistent with worsened multifocal infection. 2. No significant change in a serpiginous, thick-walled cavitary lesion about the peripheral left lung, the largest axial component at the posterolateral left upper lobe measuring 4.4 x 2.5 cm. 3. Endotracheal intubation. 4. Severe emphysema and diffuse bilateral bronchial wall thickening. 5. Frothy debris in the lower trachea and left mainstem bronchus. 6. Coronary artery disease. 7. Nonobstructive right nephrolithiasis. Medications Administered: 1 L normal saline bolus, IV cefepime, vancomycin, azithromycin  While in the ED his mental status worsened which he became minimally responsive and somnolent which he was subsequently intubated by the ED provider.  PCCM is asked to admit the patient for further workup and treatment.  03/12/22- patient has had intermittent episodes of desaturation requiring BIPAP and has increased thickened secretions.   03/13/22- patient intubated due to worsening hypoxemia and distress, plan for bronchoscopy today due to worsening secretions.  We called and spoke to daughter and wife to update family.  03/14/22- s/p BAL with GNR growing overnight, have narrowed abx to Zosyn. Procal trending up. He received 3L IVF with resultant dilutional CBC. He's on PRVC at 60FiO2, working on weaning vasopressors with levophed/vasopressin, CVP is trending. Cytology BAL from LUL in process. Family meeting today.  03/15/22- Patient with no overnight events, BAL with gram negative rods, cytology negative for atypia.  Worsening renal function and thrombocytopenia consistent with sepsis due to post Influenza bacterial pneumonia. Have broadened coverage for double pseudomonas.   Pertinent   Medical History   Past Medical History:  Diagnosis Date  Anemia    Basal cell carcinoma    BPH (benign prostatic hyperplasia)    CAD (coronary artery disease)    a. s/p multiple prior PCIs b. s/p DES-distal RCA 05/2013   COPD (chronic obstructive pulmonary disease) (HCC)    GERD (gastroesophageal reflux disease)    Macular degeneration    Pure hypercholesterolemia    Stroke ([place])    Tobacco abuse    Traumatic amputation of leg(s) (complete) (partial), unilateral, level not specified, without mention of complication 03/25/3233   Right Below Knee Amputation   Unspecified essential hypertension      Micro Data:  12/22: SARS-CoV-2 PCR>> negative 12/22: + Influenza A 12/22: Blood culture>>NGTD 12/22: Strep pneumo urinary antigen>>negative 12/22: Legionella urinary antigen>> 12/22: Mycoplasma pneumoniae>> 12/22: MRSA PCR>>negative 12/22: Tracheal aspirate>>not collected 12/22: Fungitell>> 12/23: Cryptococcus Antigen>> 12/23: Aspergillus>>  Antimicrobials:  Cefepime 12/22 x 1 dose Azithromycin 12/22>> Zosyn 12/22>>  Significant Hospital Events: Including procedures, antibiotic start and stop dates in addition to other pertinent events   12/22: Presented to ED, required intubation in the ED.  PCCM asked to admit 12/23: remains intubated, comfortable  12/24: On minimal vent settings. Tolerating SBT ~ EXTUBATE to BiPAP.    Objective   Blood pressure 118/60, pulse 61, temperature 98.6 F (37 C), resp. rate 20, height _0  (1.753 m), weight 53.9 kg, SpO2 90 %. CVP:  [1 mmHg-24 mmHg] 5 mmHg  Vent Mode: PRVC FiO2 (%):  [50 %-60 %] 50 % Set Rate:  [18 bmp] 18 bmp Vt Set:  [500 mL] 500 mL PEEP:  [5 cmH20] 5 cmH20 Pressure Support:  [10 cmH20] 10 cmH20 Plateau Pressure:  [20 TDD22-02 cmH20] 21 cmH20   Intake/Output Summary (Last 24 hours) at 03/15/2022 5427 Last data filed at 03/15/2022 0623 Gross per 24 hour  Intake 4084.25 ml  Output 276 ml  Net 3808.25 ml     Filed Weights   03/13/22 0315 03/14/22 0451 03/15/22 0500  Weight: 47.1 kg 53.3 kg 53.9 kg    Examination: General: Acute on chronically ill-appearing male, laying in bed, intubated, on Precedex awake and following commands, no acute distress HENT: Atraumatic, normocephalic, neck supple, no JVD, orally intubated Lungs: Coarse breath sounds bilaterally, no wheezing noted, even, overbreathes the vent Cardiovascular: Regular rate and rhythm, S1-S2, no murmurs, rubs, gallops Abdomen: Soft, nontender, nondistended, no guarding rebound tenderness, bowel sounds positive x 4 Extremities: Normal bulk and tone, no deformities, no edema Neuro: Lightly Sedated, awake and alert, following commands, no focal deficits, pupils PERRLA GU: Foley catheter draining yellow urine  Resolved Hospital Problem list     Assessment & Plan:   Acute on chronic Hypoxic & Hypercapnic Respiratory Failure in setting of AECOPD due to influenza A infection  LUL Cavitary Lesion -Full vent support, implement lung protective strategies -Plateau pressures less than 30 cm H20 -Wean FiO2 & PEEP as tolerated to maintain O2 sats >92% -Follow intermittent Chest X-ray & ABG as needed -Spontaneous Breathing Trials when respiratory parameters met and mental status permits -Implement VAP Bundle -Bronchodilators and Pulmicort nebs -IV Solu-Medrol has been DCD -Antibiotics -zosyn - due to GNR in BAL microbiololgy  Severe Sepsis due to Influenza A infection  (Met SIRS Criteria upon admission: RR >20, HR 100) -Monitor fever curve -Trend WBC's & Procalcitonin -Follow cultures as above -Continue empiric azithromycin, Zosyn, and Tamiflu pending cultures & sensitivities  Hypotension: suspect sedation related vs sepsis Mildly elevated troponin, suspect demand ischemia PMHx: HTN, HLD, CAD s/p stent Echocardiogram 03/03/2022: LVEF 55 to  97%, grade 2 diastolic dysfunction, RV systolic function normal -Continuous cardiac  monitoring -Maintain MAP >65 -Vasopressors as needed to maintain MAP goal -Lactic acid has normalized -Trend HS Troponin until peaked (34 ~ 48 ~) -Diuresis as BP and renal function permits ~ holding due to AKI and low dose pressors  Acute Kidney Injury -Monitor I&O's / urinary output -Follow BMP -Ensure adequate renal perfusion -Avoid nephrotoxic agents as able -Replace electrolytes as indicated -Low threshold for Nephrology consultation  Anemia of chronic disease -Monitor for S/Sx of bleeding -Trend CBC -Heparin subcu for VTE Prophylaxis  -Transfuse for Hgb <7  Diabetes Mellitus Type II -CBG's q4h; Target range of 140 to 180 -SSI -Follow ICU Hypo/Hyperglycemia protocol  Acute metabolic encephalopathy, suspect CO2 narcosis Sedation needs in the setting of mechanical ventilation PMHx: small left PCA territory stroke  -Treatment of hypercapnia and metabolic derangements as outlined above -Maintain a RASS goal of 0 to -1 -Propofol as needed to maintain RASS goal -Avoid sedating medications as able -Daily wake up assessment -Aspirin and Plavix    Patient is critically ill with multiple chronic comorbidities.  Prognosis is guarded.  Recommend DNR status.   Best Practice (right click and "Reselect all SmartList Selections" daily)   Diet/type: NPO, tube feeds DVT prophylaxis: prophylactic heparin  GI prophylaxis: PPI Lines: N/A Foley:  Yes, and it is still needed Code Status:  full code Last date of multidisciplinary goals of care discussion [12/24]    Labs   CBC: Recent Labs  Lab 03/10/22 0359 03/11/22 0541 03/13/22 0433 03/14/22 0648 03/15/22 0337  WBC 8.9 6.9 9.2 8.9 10.8*  NEUTROABS  --   --   --  8.2*  --   HGB 10.0* 10.0* 10.7* 7.6* 7.4*  HCT 32.5* 32.4* 35.6* 25.9* 24.5*  MCV 97.6 99.1 102.3* 104.4* 104.3*  PLT 195 189 258 143* 111*     Basic Metabolic Panel: Recent Labs  Lab 03/09/22 0329 03/10/22 0359 03/11/22 0541 03/12/22 0432  03/13/22 0433 03/13/22 1725 03/14/22 0648 03/15/22 0337  NA 141 150* 152* 147* 152* 149* 145 142  K 4.0 4.3 4.1 4.3 3.5 3.4* 3.6 3.4*  CL 102 109 110 108 111 111 107 104  CO2 30 33* 31 29 32 34* 31 29  GLUCOSE 202* 147* 125* 251* 176* 250* 184* 285*  BUN 48* 48* 52* 56* 52* 61* 65* 73*  CREATININE 2.12* 1.69* 1.49* 1.31* 1.50* 2.13* 2.34* 2.90*  CALCIUM 8.4* 8.5* 8.7* 8.5* 8.9 7.9* 8.1* 8.0*  MG 2.7* 2.5* 2.5* 2.3 2.3  --  2.0 2.0  PHOS 4.6 5.1* 3.7  --   --   --  3.9 3.7  3.6    GFR: Estimated Creatinine Clearance: 15.7 mL/min (A) (by C-G formula based on SCr of 2.9 mg/dL (H)). Recent Labs  Lab 03/08/22 0953 03/09/22 0329 03/10/22 0359 03/11/22 0541 03/12/22 0432 03/13/22 0433 03/13/22 1559 03/14/22 0648 03/15/22 0337  PROCALCITON  --  2.71  --   --  0.40  --  3.79  --  19.64  WBC  --  14.2*   < > 6.9  --  9.2  --  8.9 10.8*  LATICACIDVEN 1.2  --   --   --   --   --  1.5  --   --    < > = values in this interval not displayed.     Liver Function Tests: Recent Labs  Lab 03/09/22 0329 03/10/22 0359 03/15/22 0337  ALBUMIN 2.7* 2.7* 2.5*  No results for input(s): "LIPASE", "AMYLASE" in the last 168 hours. No results for input(s): "AMMONIA" in the last 168 hours.  ABG    Component Value Date/Time   PHART 7.34 (L) 03/14/2022 0431   PCO2ART 69 (HH) 03/14/2022 0431   PO2ART 164 (H) 03/14/2022 0431   HCO3 37.2 (H) 03/14/2022 0431   O2SAT 100 03/14/2022 0431     Coagulation Profile: No results for input(s): "INR", "PROTIME" in the last 168 hours.   Cardiac Enzymes: No results for input(s): "CKTOTAL", "CKMB", "CKMBINDEX", "TROPONINI" in the last 168 hours.  HbA1C: Hemoglobin A1C  Date/Time Value Ref Range Status  07/07/2017 12:00 AM 8.2  Final  06/18/2016 03:18 PM 7.9  Final   Hgb A1c MFr Bld  Date/Time Value Ref Range Status  03/08/2022 04:45 AM 8.9 (H) 4.8 - 5.6 % Final    Comment:    (NOTE)         Prediabetes: 5.7 - 6.4         Diabetes:  >6.4         Glycemic control for adults with diabetes: <7.0   03/01/2022 06:45 AM 9.0 (H) 4.8 - 5.6 % Final    Comment:    (NOTE)         Prediabetes: 5.7 - 6.4         Diabetes: >6.4         Glycemic control for adults with diabetes: <7.0     CBG: Recent Labs  Lab 03/14/22 1555 03/14/22 1951 03/14/22 2328 03/15/22 0345 03/15/22 0740  GLUCAP 175* 226* 248* 270* 157*     Review of Systems:   Unable to assess due to intubation/sedation/critical illness   Past Medical History:  He,  has a past medical history of Anemia, Basal cell carcinoma, BPH (benign prostatic hyperplasia), CAD (coronary artery disease), COPD (chronic obstructive pulmonary disease) ([place]), GERD (gastroesophageal reflux disease), Macular degeneration, Pure hypercholesterolemia, Stroke ([place]), Tobacco abuse, Traumatic amputation of leg(s) (complete) (partial), unilateral, level not specified, without mention of complication (93/81/8299), and Unspecified essential hypertension.   Surgical History:   Past Surgical History:  Procedure Laterality Date   AMPUTATION Right 08/26/2011   Below Knee Amputee   APPENDECTOMY     CARDIAC CATHETERIZATION     CAROTID ANGIOGRAPHY Right 05/14/2016   Procedure: Carotid Angiography;  Surgeon: Katha Cabal, MD;  Location: [facility];  Service: Cardiovascular;  Laterality: Right;   CAROTID PTA/STENT INTERVENTION  05/14/2016   Procedure: Carotid PTA/Stent Intervention;  Surgeon: Katha Cabal, MD;  Location: [facility];  Service: Cardiovascular;;   CORONARY ANGIOPLASTY WITH STENT PLACEMENT  09/28/2006   RCA   CORONARY ANGIOPLASTY WITH STENT PLACEMENT  07/21/2006   Mid LAD   CORONARY ANGIOPLASTY WITH STENT PLACEMENT  06/06/2013   30% prox LAD at the site of a prior stent, 40% mid LAD at the origin of D1, 40% ostial D1, 30% prox LCx at the site of a prior stent, in the proximal third of the vessel segment, 20% prox RCA, 99% distal RCA s/p DES. EF >55%.      Social History:   reports that he has been smoking cigarettes. He has a 60.00 pack-year smoking history. He has never used smokeless tobacco. He reports that he does not drink alcohol and does not use drugs.   Family History:  His family history includes Mental illness in his mother.   Allergies No Known Allergies   Home Medications  Prior to Admission medications  Medication Sig Start Date End Date Taking? Authorizing Provider  albuterol (VENTOLIN HFA) 108 (90 Base) MCG/ACT inhaler Inhale 2 puffs into the lungs every 6 (six) hours as needed for wheezing or shortness of breath. 03/03/22 06/01/22  Fritzi Mandes, MD  amLODipine (NORVASC) 10 MG tablet TAKE ONE-HALF TABLET BY MOUTH EVERY DAY FOR BLOOD PRESSURE 11/26/21   [provider]  amoxicillin-clavulanate (AUGMENTIN) 875-125 MG tablet Take 1 tablet by mouth every 12 (twelve) hours for 6 days. 03/03/22 03/09/22  Fritzi Mandes, MD  aspirin EC 81 MG tablet Take 1 tablet (81 mg total) by mouth daily. 12/01/18   Minna Merritts, MD  atorvastatin (LIPITOR) 40 MG tablet TAKE 1 TABLET BY MOUTH EVERY DAY 10/06/18   Karamalegos, Devonne Doughty, DO  azithromycin (ZITHROMAX) 500 MG tablet Take 1 tablet (500 mg total) by mouth daily for 3 days. 03/04/22 03/02/2022  Fritzi Mandes, MD  Budeson-Glycopyrrol-Formoterol (BREZTRI AEROSPHERE) 160-9-4.8 MCG/ACT AERO Inhale 2 puffs into the lungs in the morning and at bedtime. 04/08/21   Tyler Pita, MD  Cholecalciferol 50 MCG (2000 UT) TABS Take 1 tablet by mouth daily. 08/15/21   [provider]  clopidogrel (PLAVIX) 75 MG tablet Take 1 tablet (75 mg total) by mouth daily for 21 days. 03/04/22 03/25/22  Fritzi Mandes, MD  furosemide (LASIX) 20 MG tablet Take 1 tablet (20 mg total) by mouth daily as needed. 07/30/21   Minna Merritts, MD  ipratropium (ATROVENT) 0.06 % nasal spray Place 2 sprays into both nostrils 4 (four) times daily. 04/08/21   Tyler Pita, MD  ipratropium-albuterol (DUONEB)  0.5-2.5 (3) MG/3ML SOLN USE 1 NEBULIZER SOLUTION (3ML) BY ORAL INHALATION EVERY 6 HOURS AS NEEDED FOR SHORTNESS OF BREATH 09/23/21   [provider]  ketoconazole (NIZORAL) 2 % cream APPLY SUFFICIENT AMOUNT TOPICALLY TWO TIMES A DAY TO RASH ON FOOT. USE FOR 2 WEEKS, THEN DISCONTINUE 11/26/21   [provider]  lisinopril (ZESTRIL) 40 MG tablet TAKE ONE-HALF TABLET BY MOUTH EVERY DAY FOR BLOOD PRESSURE 05/14/21   [provider]  metFORMIN (GLUCOPHAGE) 500 MG tablet Take 1,000 mg by mouth 2 (two) times daily with a meal.    [provider]  metoprolol tartrate (LOPRESSOR) 50 MG tablet TAKE 1 TABLET BY MOUTH TWICE A DAY 10/30/21   Gollan, Kathlene November, MD  nitroGLYCERIN (NITROSTAT) 0.4 MG SL tablet Place 1 tablet (0.4 mg total) under the tongue every 5 (five) minutes as needed for chest pain. 10/13/17   Minna Merritts, MD  predniSONE (DELTASONE) 5 MG tablet Take 1 tablet by mouth daily. 11/26/21   [provider]  senna-docusate (SENOKOT-S) 8.6-50 MG tablet Take 1 tablet by mouth 2 (two) times daily. 03/03/22   Fritzi Mandes, MD  tamsulosin (FLOMAX) 0.4 MG CAPS capsule TAKE ONE CAPSULE BY MOUTH EVERY EVENING TO REPLACE TERAZOSIN 11/15/21   [provider]     Critical care provider statement:   Total critical care time: 33 minutes   Performed by: Lanney Gins MD   Critical care time was exclusive of separately billable procedures and treating other patients.   Critical care was necessary to treat or prevent imminent or life-threatening deterioration.   Critical care was time spent personally by me on the following activities: development of treatment plan with patient and/or surrogate as well as nursing, discussions with consultants, evaluation of patient's response to treatment, examination of patient, obtaining history from patient or surrogate, ordering and performing treatments and interventions, ordering and review of laboratory  studies, ordering and  review of radiographic studies, pulse oximetry and re-evaluation of patient's condition.    Ottie Glazier, M.D.  Pulmonary & Critical Care Medicine

## 2022-03-15 NOTE — Consult Note (Signed)
PHARMACY CONSULT NOTE - ELECTROLYTES  Pharmacy Consult for Electrolyte Monitoring and Replacement   Recent Labs: Potassium (mmol/L)  Date Value  03/15/2022 3.4 (L)  06/07/2013 3.1 (L)   Magnesium (mg/dL)  Date Value  03/15/2022 2.0   Calcium (mg/dL)  Date Value  03/15/2022 8.0 (L)   Calcium, Total (mg/dL)  Date Value  06/07/2013 8.5   Albumin (g/dL)  Date Value  03/15/2022 2.5 (L)  12/29/2013 4.7  06/01/2013 4.0   Phosphorus (mg/dL)  Date Value  03/15/2022 3.7  03/15/2022 3.6   Sodium  Date Value  03/15/2022 142 mmol/L  07/07/2017 140  06/07/2013 141 mmol/L   Corrected Ca: 9.9 mg/dL  Assessment  Stanley Mclean is a 81 y.o. male presenting with pneumonia. PMH significant for CAD, HTN, COPD, and LUL cavitary lesion. Pharmacy has been consulted to monitor and replace electrolytes while in CCU. On Amio infusion.  Diet: Vital AF at 40 mL/hr + FWF at 100 mL q4h  MIVF: lactated ringers at 75 mL/hr  Goal of Therapy: Electrolytes WNL. Aim for K+ >4 Mg > 2.   Plan:  KCL 40 mEq x 2 via tube.  Check BMP, Phos, Mg with AM labs  Thank you for allowing pharmacy to be a part of this patient's care.  Eleonore Chiquito, PharmD, BCPS 03/15/2022 7:35 AM

## 2022-03-15 NOTE — IPAL (Signed)
  Interdisciplinary Goals of Care Family Meeting   Date carried out: 03/15/2022  Location of the meeting: Conference room  Member's involved: Nurse Practitioner and Family Member or next of kin  Durable Power of Attorney or acting medical decision maker: Pt's wife Stanley Mclean, pt's daughter Stanley Mclean, son in law and 2 grandsons.  Discussion: We discussed goals of care for Stanley Mclean.  We discussed that [name] is critically ill in setting of severe COPD exacerbation due to influenza infection, along with superimposed bacterial pneumonia and left upper lobe cavitary lesion.  He has failed trial of extubation requiring reintubation, ventilator requirements currently too high to allow for liberation from ventilator, along with course complicated by acute kidney injury.  The family endorses that he has declined significantly in the past few months due to his severe COPD.  He is only able to walk minimal distances in his house before becoming extremely dyspneic, requires 4 L supplemental oxygen at baseline.  Also is extremely fatigued all the time, activity is extremely limited.  We discussed that given his critical illness, the likelihood of him getting to his prior poor functional baseline is extremely slim.  Patient would likely require tracheostomy in order to survive.  Discussed that given his poor functional status and critical illness, he would likely be a poor candidate and not tolerate renal replacement therapy.  His wife does report that she has discussed with him in the past and he was very clear stating he would not want CPR, and would not want to be maintained on a ventilator long-term (would only want a short-term trial) and would NOT WANT a tracheostomy.  They also report that he would not want to be dependent on others to live, and he would not be able to follow through with outpatient hemodialysis if he were to ultimately need it.  They request that we change CODE STATUS to  DNR, and do not wish to pursue renal replacement therapy.  They would like to transition to comfort measures in the next few days once all family has had a chance to visit.  Will continue current supportive measures in the interim. Palliative care has been consulted.   Code status:   Code Status: DNR   Disposition: Continue current acute care  Time spent for the meeting: 25 minutes   Darel Hong, AGACNP-BC [place] epic messenger for cross cover needs If after hours, please call E-link   Bradly Bienenstock, NP  03/15/2022, 5:25 PM

## 2022-03-15 NOTE — Consult Note (Signed)
[facility] CONSULT NOTE    Date: 03/15/2022                  Patient Name:  Stanley Mclean  MRN: [id number]  DOB: 06-28-1940  Age / Sex: 81 y.o., male         PCP: Olen Pel, NP                 Service Requesting Consult: Critical care                 Reason for Consult: Acute kidney injury            History of Present Illness: Patient is a 81 y.o. male with a PMHx of COPD requiring 4 L of supplemental oxygen, coronary artery disease, hypertension, diabetes mellitus type 2, history of CVA, who was admitted to [facility] on 03/09/2022 for evaluation of severe sepsis, acute respiratory failure, influenza A infection, multifocal pneumonia.  Patient unable to offer any history at this point in time.  We are now asked to see him for evaluation management of acute kidney injury.  The patient's baseline creatinine was 0.86 on 03/01/2022.Over the past several days the patient's renal function is worsening.  Creatinine up to 2.9.  He has severe sepsis and is requiring multiple pressors.  Urine output slightly greater than 300 cc over the preceding 24 hours.  Patient remains critically ill at the moment.   Medications: Outpatient medications: Medications Prior to Admission  Medication Sig Dispense Refill Last Dose   amLODipine (NORVASC) 10 MG tablet Take 5 mg by mouth daily.   03/06/2022   [EXPIRED] amoxicillin-clavulanate (AUGMENTIN) 875-125 MG tablet Take 1 tablet by mouth every 12 (twelve) hours for 6 days. 12 tablet 0 03/06/2022   atorvastatin (LIPITOR) 40 MG tablet TAKE 1 TABLET BY MOUTH EVERY DAY 90 tablet 0 03/06/2022   [EXPIRED] azithromycin (ZITHROMAX) 500 MG tablet Take 1 tablet (500 mg total) by mouth daily for 3 days. 3 tablet 0 03/06/2022   Budeson-Glycopyrrol-Formoterol (BREZTRI AEROSPHERE) 160-9-4.8 MCG/ACT AERO Inhale 2 puffs into the lungs in the morning and at bedtime. 32.1 g 0 03/06/2022   Cholecalciferol 50 MCG (2000 UT) TABS Take 1 tablet by mouth  daily.   03/06/2022   clopidogrel (PLAVIX) 75 MG tablet Take 1 tablet (75 mg total) by mouth daily for 21 days. 21 tablet 0 03/06/2022   empagliflozin (JARDIANCE) 25 MG TABS tablet Take 15-25 mg by mouth daily.   03/06/2022   lisinopril (ZESTRIL) 40 MG tablet Take 20 mg by mouth daily.   03/06/2022   metFORMIN (GLUCOPHAGE) 500 MG tablet Take 1,000 mg by mouth 2 (two) times daily with a meal.   03/06/2022   metoprolol tartrate (LOPRESSOR) 50 MG tablet TAKE 1 TABLET BY MOUTH TWICE A DAY 180 tablet 0 03/06/2022   predniSONE (DELTASONE) 5 MG tablet Take 1 tablet by mouth daily.   03/06/2022   senna-docusate (SENOKOT-S) 8.6-50 MG tablet Take 1 tablet by mouth 2 (two) times daily. 60 tablet 0 03/06/2022   sulfamethoxazole-trimethoprim (BACTRIM) 400-80 MG tablet Take 1 tablet by mouth 2 (two) times daily.   03/06/2022   tamsulosin (FLOMAX) 0.4 MG CAPS capsule Take 0.4 mg by mouth daily.   03/06/2022   triamcinolone cream (KENALOG) 0.1 % Apply 1 Application topically 2 (two) times daily as needed (Inflammation on scalp).   prn   albuterol (VENTOLIN HFA) 108 (90 Base) MCG/ACT inhaler Inhale 2 puffs into the lungs every 6 (six)  hours as needed for wheezing or shortness of breath. 8 each 2 prn   aspirin EC 81 MG tablet Take 1 tablet (81 mg total) by mouth daily. 90 tablet 3    furosemide (LASIX) 20 MG tablet Take 1 tablet (20 mg total) by mouth daily as needed. 90 tablet 3 prn   ipratropium (ATROVENT) 0.06 % nasal spray Place 2 sprays into both nostrils 4 (four) times daily. 45 mL 0 prn   ipratropium-albuterol (DUONEB) 0.5-2.5 (3) MG/3ML SOLN USE 1 NEBULIZER SOLUTION (3ML) BY ORAL INHALATION EVERY 6 HOURS AS NEEDED FOR SHORTNESS OF BREATH   prn   nitroGLYCERIN (NITROSTAT) 0.4 MG SL tablet Place 1 tablet (0.4 mg total) under the tongue every 5 (five) minutes as needed for chest pain. 25 tablet 0 prn    Current medications: Current Facility-Administered Medications  Medication Dose Route Frequency Provider  Last Rate Last Admin   0.9 %  sodium chloride infusion   Intravenous PRN Annita Brod, MD   Stopped at 03/15/22 0507   0.9 %  sodium chloride infusion  250 mL Intravenous Continuous Dallie Piles, [facility]   Held at 03/13/22 9629   acetaminophen (TYLENOL) tablet 650 mg  650 mg Per Tube Q6H PRN Bradly Bienenstock, NP       amiodarone (PACERONE) tablet 200 mg  200 mg Per Tube BID Ottie Glazier, MD   200 mg at 03/15/22 1033   aspirin chewable tablet 81 mg  81 mg Per Tube Daily Annita Brod, MD   81 mg at 03/15/22 1012   atorvastatin (LIPITOR) tablet 40 mg  40 mg Per Tube Daily Annita Brod, MD   40 mg at 03/15/22 1011   Chlorhexidine Gluconate Cloth 2 % PADS 6 each  6 each Topical Daily Annita Brod, MD   6 each at 03/15/22 1012   clopidogrel (PLAVIX) tablet 75 mg  75 mg Per Tube Daily Teressa Lower, NP   75 mg at 03/15/22 1028   docusate (COLACE) 50 MG/5ML liquid 100 mg  100 mg Per Tube BID PRN Annita Brod, MD       docusate (COLACE) 50 MG/5ML liquid 100 mg  100 mg Per Tube BID Dallie Piles, RPH   100 mg at 03/15/22 1013   feeding supplement (VITAL AF 1.2 CAL) liquid 1,000 mL  1,000 mL Per Tube Continuous Ottie Glazier, MD 50 mL/hr at 03/15/22 0705 Infusion Verify at 03/15/22 0705   fentaNYL (SUBLIMAZE) bolus via infusion 25-100 mcg  25-100 mcg Intravenous Q15 min PRN Teressa Lower, NP   100 mcg at 03/15/22 0507   fentaNYL 2558mg in NS 2581m(109mml) infusion-PREMIX  25-200 mcg/hr Intravenous Continuous NelTeressa LowerP 7.5 mL/hr at 03/15/22 0705 75 mcg/hr at 03/15/22 0705   free water 100 mL  100 mL Per Tube Q4H Aleskerov, FuaLuvenia HellerD   100 mL at 03/15/22 0822   heparin injection 5,000 Units  5,000 Units Subcutaneous Q8H KriAnnita BrodD   5,000 Units at 03/15/22 1012   insulin aspart (novoLOG) injection 0-15 Units  0-15 Units Subcutaneous Q4H Rust-Chester, Britton L, NP   2 Units at 03/15/22 0825284ipratropium-albuterol (DUONEB) 0.5-2.5 (3) MG/3ML  nebulizer solution 3 mL  3 mL Nebulization Q6H KriAnnita BrodD   3 mL at 03/15/22 0811   levofloxacin (LEVAQUIN) IVPB 750 mg  750 mg Intravenous Once AleOttie GlazierD       metoprolol tartrate (LOPRESSOR) injection 2.5-5 mg  2.5-5 mg Intravenous Q6H PRN Annita Brod, MD   5 mg at 03/13/22 0356   norepinephrine (LEVOPHED) 16 mg in 248m (0.064 mg/mL) premix infusion  0-40 mcg/min Intravenous Titrated NTeressa Lower NP 6.56 mL/hr at 03/15/22 0705 7 mcg/min at 03/15/22 02694  Oral care mouth rinse  15 mL Mouth Rinse Q2H AOttie Glazier MD   15 mL at 03/15/22 1012   Oral care mouth rinse  15 mL Mouth Rinse PRN AOttie Glazier MD       pantoprazole (PROTONIX) injection 40 mg  40 mg Intravenous Q12H KDarel HongD, NP   40 mg at 03/15/22 1028   piperacillin-tazobactam (ZOSYN) IVPB 2.25 g  2.25 g Intravenous Q8H CBenita Gutter RPH       polyethylene glycol (MIRALAX / GLYCOLAX) packet 17 g  17 g Per Tube Daily PRN KAnnita Brod MD       polyethylene glycol (MIRALAX / GLYCOLAX) packet 17 g  17 g Per Tube Daily NTeressa Lower NP   17 g at 03/15/22 1012   vasopressin (PITRESSIN) 20 Units in sodium chloride 0.9 % 100 mL infusion-*FOR SHOCK*  0-0.03 Units/min Intravenous Continuous Rust-Chester, BHuel Cote NP 9 mL/hr at 03/15/22 0705 0.03 Units/min at 03/15/22 08546     Allergies: No Known Allergies    Past Medical History: Past Medical History:  Diagnosis Date   Anemia    Basal cell carcinoma    BPH (benign prostatic hyperplasia)    CAD (coronary artery disease)    a. s/p multiple prior PCIs b. s/p DES-distal RCA 05/2013   COPD (chronic obstructive pulmonary disease) (HCC)    GERD (gastroesophageal reflux disease)    Macular degeneration    Pure hypercholesterolemia    [place]    Tobacco abuse    Traumatic amputation of leg(s) (complete) (partial), unilateral, level not specified, without mention of complication 027/05/5007  Right Below Knee Amputation    Unspecified essential hypertension      Past Surgical History: Past Surgical History:  Procedure Laterality Date   AMPUTATION Right 08/26/2011   Below Knee Amputee   APPENDECTOMY     CARDIAC CATHETERIZATION     CAROTID ANGIOGRAPHY Right 05/14/2016   Procedure: Carotid Angiography;  Surgeon: GKatha Cabal MD;  [facility];  Service: Cardiovascular;  Laterality: Right;   CAROTID PTA/STENT INTERVENTION  05/14/2016   Procedure: Carotid PTA/Stent Intervention;  Surgeon: GKatha Cabal MD;  [facility];  Service: Cardiovascular;;   CORONARY ANGIOPLASTY WITH STENT PLACEMENT  09/28/2006   RCA   CORONARY ANGIOPLASTY WITH STENT PLACEMENT  07/21/2006   Mid LAD   CORONARY ANGIOPLASTY WITH STENT PLACEMENT  06/06/2013   30% prox LAD at the site of a prior stent, 40% mid LAD at the origin of D1, 40% ostial D1, 30% prox LCx at the site of a prior stent, in the proximal third of the vessel segment, 20% prox RCA, 99% distal RCA s/p DES. EF >55%.     Family History: Family History  Problem Relation Age of Onset   Mental illness Mother      Social History: Social History   Socioeconomic History   Marital status: Married    Spouse name: LJveon Pound  Number of children: Not on file   Years of education: Not on file   Highest education level: Not on file  Occupational History   Occupation: retired   Tobacco Use   Smoking status:  Every Day    Packs/day: 1.00    Years: 60.00    Total pack years: 60.00    Types: Cigarettes    Last attempt to quit: 03/29/2016    Years since quitting: 5.9   Smokeless tobacco: Never   Tobacco comments:    1 pack will last 1 week--03/05/21  Vaping Use   Vaping Use: Never used  Substance and Sexual Activity   Alcohol use: No   Drug use: No   Sexual activity: Never  Other Topics Concern   Not on file  Social History Narrative   Not on file   Social Determinants of Health   Financial Resource Strain: Low Risk   (07/01/2021)   Overall Financial Resource Strain (CARDIA)    Difficulty of Paying Living Expenses: Not hard at all  Food Insecurity: No Food Insecurity (07/01/2021)   Hunger Vital Sign    Worried About Running Out of Food in the Last Year: Never true    Ran Out of Food in the Last Year: Never true  Transportation Needs: No Transportation Needs (07/01/2021)   PRAPARE - Hydrologist (Medical): No    Lack of Transportation (Non-Medical): No  Physical Activity: Inactive (07/01/2021)   Exercise Vital Sign    Days of Exercise per Week: 0 days    Minutes of Exercise per Session: 0 min  Stress: No Stress Concern Present (07/01/2021)   [place]    Feeling of Stress : Not at all  Social Connections: [place] (07/01/2021)   Social Connection and Isolation Panel [NHANES]    Frequency of Communication with Friends and Family: Twice a week    Frequency of Social Gatherings with Friends and Family: Twice a week    Attends Religious Services: More than 4 times per year    Active Member of [employer] or Organizations: Yes    Attends Archivist Meetings: Never    Marital Status: Married  Human resources officer Violence: Not At Risk (07/01/2021)   Humiliation, Afraid, Rape, and Kick questionnaire    Fear of Current or Ex-Partner: No    Emotionally Abused: No    Physically Abused: No    Sexually Abused: No     Review of Systems: As per HPI  Vital Signs: Blood pressure 118/60, pulse 61, temperature 98.6 F (37 C), resp. rate 20, height _0  (1.753 m), weight 53.9 kg, SpO2 90 %.  Weight trends: Filed Weights   03/13/22 0315 03/14/22 0451 03/15/22 0500  Weight: 47.1 kg 53.3 kg 53.9 kg     Physical Exam: General: Critically ill-appearing  Head: Normocephalic, atraumatic.  Endotracheal tube in place  Eyes: Anicteric  Neck: Supple  Lungs:  Scattered rhonchi and rales, vent assisted   Heart: S1S2 no rubs  Abdomen:  Soft, nontender, bowel sounds present  Extremities: No peripheral edema.  Neurologic: Intubated, sedated  Skin: No acute rash  Access: No hemodialysis access    Lab results: Basic Metabolic Panel: Recent Labs  Lab 03/11/22 0541 03/12/22 0432 03/13/22 0433 03/13/22 1725 03/14/22 0648 03/15/22 0337  NA 152* 147* 152* 149* 145 142  K 4.1 4.3 3.5 3.4* 3.6 3.4*  CL 110 108 111 111 107 104  CO2 31 29 32 34* 31 29  GLUCOSE 125* 251* 176* 250* 184* 285*  BUN 52* 56* 52* 61* 65* 73*  CREATININE 1.49* 1.31* 1.50* 2.13* 2.34* 2.90*  CALCIUM 8.7* 8.5* 8.9 7.9* 8.1* 8.0*  MG  2.5* 2.3 2.3  --  2.0 2.0  PHOS 3.7  --   --   --  3.9 3.7  3.6    Liver Function Tests: Recent Labs  Lab 03/09/22 0329 03/10/22 0359 03/15/22 0337  ALBUMIN 2.7* 2.7* 2.5*   No results for input(s): "LIPASE", "AMYLASE" in the last 168 hours. No results for input(s): "AMMONIA" in the last 168 hours.  CBC: Recent Labs  Lab 03/10/22 0359 03/11/22 0541 03/13/22 0433 03/14/22 0648 03/15/22 0337  WBC 8.9 6.9 9.2 8.9 10.8*  NEUTROABS  --   --   --  8.2*  --   HGB 10.0* 10.0* 10.7* 7.6* 7.4*  HCT 32.5* 32.4* 35.6* 25.9* 24.5*  MCV 97.6 99.1 102.3* 104.4* 104.3*  PLT 195 189 258 143* 111*    Cardiac Enzymes: No results for input(s): "CKTOTAL", "CKMB", "CKMBINDEX", "TROPONINI" in the last 168 hours.  BNP: Invalid input(s): "POCBNP"  CBG: Recent Labs  Lab 03/14/22 1555 03/14/22 1951 03/14/22 2328 03/15/22 0345 03/15/22 0740  GLUCAP 175* 226* 248* 270* 157*    Microbiology: Results for orders placed or performed during the hospital encounter of 03/01/2022  Culture, blood (routine x 2)     Status: None   Collection Time: 02/28/2022  3:28 PM   Specimen: BLOOD  Result Value Ref Range Status   Specimen Description BLOOD RIGHT ANTECUBITAL  Final   Special Requests   Final    BOTTLES DRAWN AEROBIC AND ANAEROBIC Blood Culture adequate volume   Culture   Final    NO  GROWTH 5 DAYS Performed at [facility], [address], [place], [place] [address]    Report Status 03/12/2022 FINAL  Final  Culture, blood (routine x 2)     Status: None   Collection Time: 02/22/2022  3:32 PM   Specimen: BLOOD  Result Value Ref Range Status   Specimen Description BLOOD BLOOD LEFT ARM  Final   Special Requests   Final    BOTTLES DRAWN AEROBIC AND ANAEROBIC Blood Culture adequate volume   Culture   Final    NO GROWTH 5 DAYS Performed at [facility], [address], [place], [place] [address]    Report Status 03/12/2022 FINAL  Final  Resp panel by RT-PCR (RSV, Flu A&B, Covid) Anterior Nasal Swab     Status: Abnormal   Collection Time: 02/20/2022  3:32 PM   Specimen: Anterior Nasal Swab  Result Value Ref Range Status   SARS Coronavirus 2 by RT PCR NEGATIVE NEGATIVE Final    Comment: (NOTE) SARS-CoV-2 target nucleic acids are NOT DETECTED.  The SARS-CoV-2 RNA is generally detectable in upper respiratory specimens during the acute phase of infection. The lowest concentration of SARS-CoV-2 viral copies this assay can detect is 138 copies/mL. A negative result does not preclude SARS-Cov-2 infection and should not be used as the sole basis for treatment or other patient management decisions. A negative result may occur with  improper specimen collection/handling, submission of specimen other than nasopharyngeal swab, presence of viral mutation(s) within the areas targeted by this assay, and inadequate number of viral copies(<138 copies/mL). A negative result must be combined with clinical observations, patient history, and epidemiological information. The expected result is Negative.  Fact Sheet for Patients:  EntrepreneurPulse.com.au  Fact Sheet for Healthcare Providers:  IncredibleEmployment.be  This test is no t yet approved or cleared by the [place] FDA and  has been authorized for detection and/or  diagnosis of SARS-CoV-2 by FDA under an Emergency Use  Authorization (EUA). This EUA will remain  in effect (meaning this test can be used) for the duration of the COVID-19 declaration under Section 564(b)(1) of the Act, 21 U.S.C.section 360bbb-3(b)(1), unless the authorization is terminated  or revoked sooner.       Influenza A by PCR POSITIVE (A) NEGATIVE Final   Influenza B by PCR NEGATIVE NEGATIVE Final    Comment: (NOTE) The Xpert Xpress SARS-CoV-2/FLU/RSV plus assay is intended as an aid in the diagnosis of influenza from Nasopharyngeal swab specimens and should not be used as a sole basis for treatment. Nasal washings and aspirates are unacceptable for Xpert Xpress SARS-CoV-2/FLU/RSV testing.  Fact Sheet for Patients: EntrepreneurPulse.com.au  Fact Sheet for Healthcare Providers: IncredibleEmployment.be  This test is not yet approved or cleared by the [place] FDA and has been authorized for detection and/or diagnosis of SARS-CoV-2 by FDA under an Emergency Use Authorization (EUA). This EUA will remain in effect (meaning this test can be used) for the duration of the COVID-19 declaration under Section 564(b)(1) of the Act, 21 U.S.C. section 360bbb-3(b)(1), unless the authorization is terminated or revoked.     Resp Syncytial Virus by PCR NEGATIVE NEGATIVE Final    Comment: (NOTE) Fact Sheet for Patients: EntrepreneurPulse.com.au  Fact Sheet for Healthcare Providers: IncredibleEmployment.be  This test is not yet approved or cleared by the [place] FDA and has been authorized for detection and/or diagnosis of SARS-CoV-2 by FDA under an Emergency Use Authorization (EUA). This EUA will remain in effect (meaning this test can be used) for the duration of the COVID-19 declaration under Section 564(b)(1) of the Act, 21 U.S.C. section 360bbb-3(b)(1), unless the authorization is terminated  or revoked.  Performed at [facility], [address] [place], [place] [address]   MRSA Next Gen by PCR, Nasal     Status: None   Collection Time: 03/13/2022  6:05 PM   Specimen: Nasal Mucosa; Nasal Swab  Result Value Ref Range Status   MRSA by PCR Next Gen NOT DETECTED NOT DETECTED Final    Comment: (NOTE) The GeneXpert MRSA Assay (FDA approved for NASAL specimens only), is one component of a comprehensive MRSA colonization surveillance program. It is not intended to diagnose MRSA infection nor to guide or monitor treatment for MRSA infections. Test performance is not FDA approved in patients less than 46 years old. Performed at [facility], [address] [place], [place] [address]   Culture, Respiratory w Gram Stain     Status: None (Preliminary result)   Collection Time: 03/13/22 12:34 PM   Specimen: Bronchial Wash; Respiratory  Result Value Ref Range Status   Specimen Description   Final    BRONCHIAL WASHINGS Performed at [facility], [address] [place],  [address]    Special Requests   Final    NONE Performed at [facility], [address] [place],  [address]    Gram Stain   Final    MODERATE WBC PRESENT, PREDOMINANTLY PMN MODERATE GRAM NEGATIVE RODS    Culture   Final    ABUNDANT GRAM NEGATIVE RODS CULTURE REINCUBATED FOR BETTER GROWTH Performed at [facility], [place] [address] [place],  [address]    Report Status PENDING  Incomplete  Culture, fungus without smear     Status: None (Preliminary result)   Collection Time: 03/13/22 12:34 PM   Specimen: Bronchial Wash; Lung  Result Value Ref Range Status   Specimen Description   Final  BRONCHIAL WASHINGS Performed at [facility], [address], [place], [place] [address]    Special Requests   Final    NONE Performed at [facility], [address] [place], [place] [address]    Culture   Final    NO FUNGUS  ISOLATED AFTER 1 DAY Performed at [facility], [place] [address] [place],  [address]    Report Status PENDING  Incomplete    Coagulation Studies: No results for input(s): "LABPROT", "INR" in the last 72 hours.  Urinalysis: No results for input(s): "COLORURINE", "LABSPEC", "PHURINE", "GLUCOSEU", "HGBUR", "BILIRUBINUR", "KETONESUR", "PROTEINUR", "UROBILINOGEN", "NITRITE", "LEUKOCYTESUR" in the last 72 hours.  Invalid input(s): "APPERANCEUR"    Imaging: DG Chest Port 1 View  Result Date: 03/15/2022 CLINICAL DATA:  Acute respiratory failure with hypoxia. EXAM: PORTABLE CHEST 1 VIEW COMPARISON:  03/13/2022 FINDINGS: LEFT IJ central line tip overlies superior vena cava. Endotracheal tube tip is 6 centimeters above the carina. Enteric tube tip is off the image, below the gastroesophageal junction. There are patchy and coarse infiltrates throughout the lungs bilaterally, LEFT greater than RIGHT and stable in appearance. Bilateral pleural thickening or small effusions appear stable. IMPRESSION: 1. Stable diffuse pulmonary infiltrates. 2. Stable support apparatus. Electronically Signed   By: Nolon Nations M.D.   On: 03/15/2022 08:28   DG Chest Port 1 View  Result Date: 03/13/2022 CLINICAL DATA:  Central line placement. EXAM: PORTABLE CHEST 1 VIEW COMPARISON:  AP chest 03/13/2022 at 02 a.m. FINDINGS: New left internal jugular central venous catheter tip overlies the mid to superior aspect of the superior vena cava. Enteric tube again descends below the diaphragm with the tip excluded by collimation. Endotracheal tube tip terminates approximately 5 cm above the carina, likely mildly advanced from prior. Cardiac silhouette and mediastinal contours are within normal limits. There are again interstitial and heterogeneous opacities throughout the left lung and within the right lower lung, similar to prior. Left costophrenic angle is excluded. No large pleural effusion is seen. No pneumothorax.  Mild-to-moderate multilevel degenerative disc changes of the thoracic spine. IMPRESSION: 1. New left internal jugular central venous catheter tip overlies the mid to superior aspect of the superior vena cava. No pneumothorax. 2. Unchanged interstitial and heterogeneous opacities throughout the left lung and within the right lower lung. Electronically Signed   By: Yvonne Kendall M.D.   On: 03/13/2022 15:27   DG Abd 1 View  Result Date: 03/13/2022 CLINICAL DATA:  Encounter for orogastric tube placement EXAM: ABDOMEN - 1 VIEW COMPARISON:  03/13/2022 FINDINGS: NG tube is been advanced slightly with the tip in the body of the stomach. Side hole within the body of the stomach. Normal bowel gas pattern. Diffuse bibasilar airspace disease. IMPRESSION: NGT advanced slightly with the tip in the body of the stomach. Electronically Signed   By: Franchot Gallo M.D.   On: 03/13/2022 10:51     Assessment & Plan: Pt is a 81 y.o. male with a PMHx of COPD requiring 4 L of supplemental oxygen, coronary artery disease, hypertension, diabetes mellitus type 2, history of CVA, who was admitted to [facility] on 02/22/2022 for evaluation of severe sepsis, acute respiratory failure, influenza A infection, multifocal pneumonia.   1.  Acute kidney injury secondary to acute tubular necrosis from severe sepsis and critical illness.  Creatinine now up to 2.9.  Urine output slightly greater than 300 cc over the preceding 24 hours.  I had a discussion with the patient's family regarding his acute kidney  injury.  We talked about the potential for renal placement therapy.  Patient's family currently unclear on this.  They would like to have a goals of care meeting with Dr. Lanney Gins.  No immediate need for dialysis but if oliguria persists in the family desires aggressive care CRRT could be considered.  2.  Acute respiratory failure.  Patient maintained on the ventilator at this time.  Continue vent support at this time.  Goals of care meeting  to occur today.  3.  Hypotension secondary to severe sepsis.  Patient on multiple pressors.  Maintain pressures to achieve a MAP of 65 or greater.  4.  Overall prognosis quite guarded.  Thanks for consultation.

## 2022-03-16 DIAGNOSIS — J1100 Influenza due to unidentified influenza virus with unspecified type of pneumonia: Secondary | ICD-10-CM | POA: Diagnosis not present

## 2022-03-16 DIAGNOSIS — J9621 Acute and chronic respiratory failure with hypoxia: Secondary | ICD-10-CM | POA: Diagnosis not present

## 2022-03-16 DIAGNOSIS — J189 Pneumonia, unspecified organism: Secondary | ICD-10-CM | POA: Diagnosis not present

## 2022-03-16 DIAGNOSIS — J441 Chronic obstructive pulmonary disease with (acute) exacerbation: Secondary | ICD-10-CM | POA: Diagnosis not present

## 2022-03-16 DIAGNOSIS — J9622 Acute and chronic respiratory failure with hypercapnia: Secondary | ICD-10-CM | POA: Diagnosis not present

## 2022-03-16 DIAGNOSIS — J984 Other disorders of lung: Secondary | ICD-10-CM | POA: Diagnosis not present

## 2022-03-16 LAB — RENAL FUNCTION PANEL
Albumin: 2.4 g/dL — ABNORMAL LOW (ref 3.5–5.0)
Anion gap: 10 (ref 5–15)
BUN: 89 mg/dL — ABNORMAL HIGH (ref 8–23)
CO2: 29 mmol/L (ref 22–32)
Calcium: 8 mg/dL — ABNORMAL LOW (ref 8.9–10.3)
Chloride: 105 mmol/L (ref 98–111)
Creatinine, Ser: 3.64 mg/dL — ABNORMAL HIGH (ref 0.61–1.24)
GFR, Estimated: 16 mL/min — ABNORMAL LOW (ref >60)
Glucose, Bld: 147 mg/dL — ABNORMAL HIGH (ref 70–99)
Phosphorus: 4.7 mg/dL — ABNORMAL HIGH (ref 2.5–4.6)
Potassium: 4 mmol/L (ref 3.5–5.1)
Sodium: 144 mmol/L (ref 135–145)

## 2022-03-16 LAB — CULTURE, RESPIRATORY W GRAM STAIN

## 2022-03-16 LAB — CBC
HCT: 25.5 % — ABNORMAL LOW (ref 39.0–52.0)
Hemoglobin: 7.5 g/dL — ABNORMAL LOW (ref 13.0–17.0)
MCH: 30.6 pg (ref 26.0–34.0)
MCHC: 29.4 g/dL — ABNORMAL LOW (ref 30.0–36.0)
MCV: 104.1 fL — ABNORMAL HIGH (ref 80.0–100.0)
Platelets: 145 10*3/uL — ABNORMAL LOW (ref 150–400)
RBC: 2.45 MIL/uL — ABNORMAL LOW (ref 4.22–5.81)
RDW: 14.6 % (ref 11.5–15.5)
WBC: 11.5 10*3/uL — ABNORMAL HIGH (ref 4.0–10.5)
nRBC: 0 % (ref 0.0–0.2)

## 2022-03-16 LAB — GLUCOSE, CAPILLARY
Glucose-Capillary: 95 mg/dL (ref 70–99)
Glucose-Capillary: 130 mg/dL — ABNORMAL HIGH (ref 70–99)
Glucose-Capillary: 142 mg/dL — ABNORMAL HIGH (ref 70–99)
Glucose-Capillary: 206 mg/dL — ABNORMAL HIGH (ref 70–99)
Glucose-Capillary: 240 mg/dL — ABNORMAL HIGH (ref 70–99)

## 2022-03-16 LAB — MAGNESIUM: Magnesium: 2.2 mg/dL (ref 1.7–2.4)

## 2022-03-16 LAB — ACID FAST SMEAR (AFB, MYCOBACTERIA): Acid Fast Smear: NEGATIVE

## 2022-03-16 LAB — PROCALCITONIN: Procalcitonin: 20.65 ng/mL

## 2022-03-16 LAB — PHOSPHORUS: Phosphorus: 4.6 mg/dL (ref 2.5–4.6)

## 2022-03-16 MED ORDER — SODIUM CHLORIDE 0.9 % IV SOLN
500.0000 mg | Freq: Two times a day (BID) | INTRAVENOUS | Status: DC
  Administered 2022-03-16 – 2022-03-17 (×4): 500 mg via INTRAVENOUS
  Filled 2022-03-16 (×2): qty 500
  Filled 2022-03-16 (×3): qty 10

## 2022-03-16 NOTE — Progress Notes (Signed)
NAME:  Stanley Mclean, MRN:  [id number], DOB:  [birth year], LOS: 9 ADMISSION DATE:  02/14/2022, CONSULTATION DATE:  03/05/2022 REFERRING MD:  Dr. Cherylann Banas, CHIEF COMPLAINT: Shortness of breath  Brief Pt Description / Synopsis:  81 year old male admitted with Severe Sepsis and acute on chronic hypoxic and hypercapnic respiratory failure in the setting of AECOPD due to influenza A infection, and LUL Cavitary lesion requiring intubation and mechanical ventilation.  History of Present Illness:  Stanley Mclean is a 81 year old male with a past medical history significant for COPD requiring 4 L supplemental oxygen at baseline, CAD, hypertension, diabetes mellitus, stroke who presents to [facility] ED on 02/20/2022 due to shortness of breath.  Patient currently is intubated and currently no family is available to contribute to history, therefore history obtained from chart review.  Per ED and nursing notes, the patient's family contacted EMS due to shortness of breath.  Upon EMS arrival he was noted to be hypoxic with O2 saturations of 82% on his baseline 4 L nasal cannula.  EMS noted severe wheezing with poor air movement of which she was given 125 mg Solu-Medrol, 4 g of magnesium, and 3 DuoNeb's.  Upon arrival to the ED he continued to have respiratory distress, only able to talk in single words.   ED Course: Initial vital signs: Temperature 98, respiratory rate 23, pulse 125, blood pressure 96/65 Significant Labs: Bicarb 34, glucose 188, BUN 22, creatinine 1.24, AST 45, high-sensitivity troponin 34, lactic acid 1.8, procalcitonin 0.11, WBC 10.6, hemoglobin 11.3, hematocrit 36.8 Positive for influenza A Urinalysis negative for UTI Imaging Chest X-ray>>FINDINGS: The heart size and mediastinal contours are within normal limits. Nasogastric tube is seen in right mainstem bronchus. Endotracheal tube is in good position. Bilateral lung opacities are again noted concerning for multifocal pneumonia. The  visualized skeletal structures are unremarkable CT Chest w/o Contrast>>IMPRESSION: 1. Extensive bilateral heterogeneous ground-glass and consolidative airspace opacity throughout the lungs is slightly worsened compared to prior examination. Findings are consistent with worsened multifocal infection. 2. No significant change in a serpiginous, thick-walled cavitary lesion about the peripheral left lung, the largest axial component at the posterolateral left upper lobe measuring 4.4 x 2.5 cm. 3. Endotracheal intubation. 4. Severe emphysema and diffuse bilateral bronchial wall thickening. 5. Frothy debris in the lower trachea and left mainstem bronchus. 6. Coronary artery disease. 7. Nonobstructive right nephrolithiasis. Medications Administered: 1 L normal saline bolus, IV cefepime, vancomycin, azithromycin  While in the ED his mental status worsened which he became minimally responsive and somnolent which he was subsequently intubated by the ED provider.  PCCM is asked to admit the patient for further workup and treatment.  03/12/22- patient has had intermittent episodes of desaturation requiring BIPAP and has increased thickened secretions.   03/13/22- patient intubated due to worsening hypoxemia and distress, plan for bronchoscopy today due to worsening secretions.  We called and spoke to daughter and wife to update family.  03/14/22- s/p BAL with GNR growing overnight, have narrowed abx to Zosyn. Procal trending up. He received 3L IVF with resultant dilutional CBC. He's on PRVC at 60FiO2, working on weaning vasopressors with levophed/vasopressin, CVP is trending. Cytology BAL from LUL in process. Family meeting today.  03/15/22- Patient with no overnight events, BAL with gram negative rods, cytology negative for atypia.  Worsening renal function and thrombocytopenia consistent with sepsis due to post Influenza bacterial pneumonia. Have broadened coverage for double pseudomonas.  03/16/22-  patient is DNR now and he is doing poorly.  Renal failure  is worse , his family does not wish to initiate dialysis.  GOC was performed and comfort care plan is in order per family wishes.   Pertinent  Medical History   Past Medical History:  Diagnosis Date   Anemia    Basal cell carcinoma    BPH (benign prostatic hyperplasia)    CAD (coronary artery disease)    a. s/p multiple prior PCIs b. s/p DES-distal RCA 05/2013   COPD (chronic obstructive pulmonary disease) (HCC)    GERD (gastroesophageal reflux disease)    Macular degeneration    Pure hypercholesterolemia    Stroke ([place])    Tobacco abuse    Traumatic amputation of leg(s) (complete) (partial), unilateral, level not specified, without mention of complication 99/35/7017   Right Below Knee Amputation   Unspecified essential hypertension      Micro Data:  12/22: SARS-CoV-2 PCR>> negative 12/22: + Influenza A 12/22: Blood culture>>NGTD 12/22: Strep pneumo urinary antigen>>negative 12/22: Legionella urinary antigen>> 12/22: Mycoplasma pneumoniae>> 12/22: MRSA PCR>>negative 12/22: Tracheal aspirate>>not collected 12/22: Fungitell>> 12/23: Cryptococcus Antigen>> 12/23: Aspergillus>>  Antimicrobials:  Cefepime 12/22 x 1 dose Azithromycin 12/22>> Zosyn 12/22>>  Significant Hospital Events: Including procedures, antibiotic start and stop dates in addition to other pertinent events   12/22: Presented to ED, required intubation in the ED.  PCCM asked to admit 12/23: remains intubated, comfortable  12/24: On minimal vent settings. Tolerating SBT ~ EXTUBATE to BiPAP.    Objective   Blood pressure (!) 135/52, pulse 86, temperature (!) 97 F (36.1 C), temperature source Esophageal, resp. rate 17, height 5' 9" (1.753 m), weight 53.8 kg, SpO2 94 %. CVP:  [0 mmHg-18 mmHg] 5 mmHg  Vent Mode: PRVC FiO2 (%):  [50 %-60 %] 60 % Set Rate:  [18 bmp] 18 bmp Vt Set:  [500 mL] 500 mL PEEP:  [5 cmH20] 5 cmH20 Plateau Pressure:  [12  BLT90-30 cmH20] 19 cmH20   Intake/Output Summary (Last 24 hours) at 03/16/2022 0940 Last data filed at 03/16/2022 0923 Gross per 24 hour  Intake 2170.14 ml  Output 103 ml  Net 2067.14 ml    Filed Weights   03/14/22 0451 03/15/22 0500 03/16/22 0500  Weight: 53.3 kg 53.9 kg 53.8 kg    Examination: General: Acute on chronically ill-appearing male, laying in bed, intubated, on Precedex awake and following commands, no acute distress HENT: Atraumatic, normocephalic, neck supple, no JVD, orally intubated Lungs: Coarse breath sounds bilaterally, no wheezing noted, even, overbreathes the vent Cardiovascular: Regular rate and rhythm, S1-S2, no murmurs, rubs, gallops Abdomen: Soft, nontender, nondistended, no guarding rebound tenderness, bowel sounds positive x 4 Extremities: Normal bulk and tone, no deformities, no edema Neuro: Lightly Sedated, awake and alert, following commands, no focal deficits, pupils PERRLA GU: Foley catheter draining yellow urine  Resolved Hospital Problem list     Assessment & Plan:   Acute on chronic Hypoxic & Hypercapnic Respiratory Failure in setting of AECOPD due to influenza A infection  LUL Cavitary Lesion -Full vent support, implement lung protective strategies -Plateau pressures less than 30 cm H20 -Wean FiO2 & PEEP as tolerated to maintain O2 sats >92% -Follow intermittent Chest X-ray & ABG as needed -Spontaneous Breathing Trials when respiratory parameters met and mental status permits -Implement VAP Bundle -Bronchodilators and Pulmicort nebs -IV Solu-Medrol has been DCD -Antibiotics -zosyn - due to GNR in BAL microbiololgy  Severe Sepsis due to Influenza A infection  (Met SIRS Criteria upon admission: RR >20, HR 100) -Monitor fever curve -Trend WBC's &  Procalcitonin -Follow cultures as above -Continue empiric azithromycin, Zosyn, and Tamiflu pending cultures & sensitivities  Hypotension: suspect sedation related vs sepsis Mildly elevated  troponin, suspect demand ischemia PMHx: HTN, HLD, CAD s/p stent Echocardiogram 03/03/2022: LVEF 55 to 01%, grade 2 diastolic dysfunction, RV systolic function normal -Continuous cardiac monitoring -Maintain MAP >65 -Vasopressors as needed to maintain MAP goal -Lactic acid has normalized -Trend HS Troponin until peaked (34 ~ 48 ~) -Diuresis as BP and renal function permits ~ holding due to AKI and low dose pressors  Acute Kidney Injury -Monitor I&O's / urinary output -Follow BMP -Ensure adequate renal perfusion -Avoid nephrotoxic agents as able -Replace electrolytes as indicated -Low threshold for Nephrology consultation  Anemia of chronic disease -Monitor for S/Sx of bleeding -Trend CBC -Heparin subcu for VTE Prophylaxis  -Transfuse for Hgb <7  Diabetes Mellitus Type II -CBG's q4h; Target range of 140 to 180 -SSI -Follow ICU Hypo/Hyperglycemia protocol  Acute metabolic encephalopathy, suspect CO2 narcosis Sedation needs in the setting of mechanical ventilation PMHx: small left PCA territory stroke  -Treatment of hypercapnia and metabolic derangements as outlined above -Maintain a RASS goal of 0 to -1 -Propofol as needed to maintain RASS goal -Avoid sedating medications as able -Daily wake up assessment -Aspirin and Plavix    Patient is critically ill with multiple chronic comorbidities.  Prognosis is guarded.  Recommend DNR status.   Best Practice (right click and "Reselect all SmartList Selections" daily)   Diet/type: NPO, tube feeds DVT prophylaxis: prophylactic heparin  GI prophylaxis: PPI Lines: N/A Foley:  Yes, and it is still needed Code Status:  full code Last date of multidisciplinary goals of care discussion [12/24]    Labs   CBC: Recent Labs  Lab 03/11/22 0541 03/13/22 0433 03/14/22 0648 03/15/22 0337 03/16/22 0225  WBC 6.9 9.2 8.9 10.8* 11.5*  NEUTROABS  --   --  8.2*  --   --   HGB 10.0* 10.7* 7.6* 7.4* 7.5*  HCT 32.4* 35.6* 25.9* 24.5*  25.5*  MCV 99.1 102.3* 104.4* 104.3* 104.1*  PLT 189 258 143* 111* 145*     Basic Metabolic Panel: Recent Labs  Lab 03/10/22 0359 03/11/22 0541 03/12/22 0432 03/13/22 0433 03/13/22 1725 03/14/22 0648 03/15/22 0337 03/16/22 0225  NA 150* 152* 147* 152* 149* 145 142 144  K 4.3 4.1 4.3 3.5 3.4* 3.6 3.4* 4.0  CL 109 110 108 111 111 107 104 105  CO2 33* 31 29 32 34* _0 GLUCOSE 147* 125* 251* 176* 250* 184* 285* 147*  BUN 48* 52* 56* 52* 61* 65* 73* 89*  CREATININE 1.69* 1.49* 1.31* 1.50* 2.13* 2.34* 2.90* 3.64*  CALCIUM 8.5* 8.7* 8.5* 8.9 7.9* 8.1* 8.0* 8.0*  MG 2.5* 2.5* 2.3 2.3  --  2.0 2.0 2.2  PHOS 5.1* 3.7  --   --   --  3.9 3.7  3.6 4.7*  4.6    GFR: Estimated Creatinine Clearance: 12.5 mL/min (A) (by C-G formula based on SCr of 3.64 mg/dL (H)). Recent Labs  Lab 03/12/22 0432 03/13/22 0433 03/13/22 1559 03/14/22 0648 03/15/22 0337 03/16/22 0225  PROCALCITON 0.40  --  3.79  --  19.64 20.65  WBC  --  9.2  --  8.9 10.8* 11.5*  LATICACIDVEN  --   --  1.5  --   --   --      Liver Function Tests: Recent Labs  Lab 03/10/22 0359 03/15/22 0337 03/16/22 0225  ALBUMIN 2.7* 2.5* 2.4*  No results for input(s): "LIPASE", "AMYLASE" in the last 168 hours. No results for input(s): "AMMONIA" in the last 168 hours.  ABG    Component Value Date/Time   PHART 7.34 (L) 03/14/2022 0431   PCO2ART 69 (HH) 03/14/2022 0431   PO2ART 164 (H) 03/14/2022 0431   HCO3 37.2 (H) 03/14/2022 0431   O2SAT 100 03/14/2022 0431     Coagulation Profile: No results for input(s): "INR", "PROTIME" in the last 168 hours.   Cardiac Enzymes: No results for input(s): "CKTOTAL", "CKMB", "CKMBINDEX", "TROPONINI" in the last 168 hours.  HbA1C: Hemoglobin A1C  Date/Time Value Ref Range Status  07/07/2017 12:00 AM 8.2  Final  06/18/2016 03:18 PM 7.9  Final   Hgb A1c MFr Bld  Date/Time Value Ref Range Status  03/08/2022 04:45 AM 8.9 (H) 4.8 - 5.6 % Final    Comment:     (NOTE)         Prediabetes: 5.7 - 6.4         Diabetes: >6.4         Glycemic control for adults with diabetes: <7.0   03/01/2022 06:45 AM 9.0 (H) 4.8 - 5.6 % Final    Comment:    (NOTE)         Prediabetes: 5.7 - 6.4         Diabetes: >6.4         Glycemic control for adults with diabetes: <7.0     CBG: Recent Labs  Lab 03/15/22 1134 03/15/22 1714 03/15/22 1934 03/15/22 2345 03/16/22 0741  GLUCAP 159* 248* 194* 159* 95     Review of Systems:   Unable to assess due to intubation/sedation/critical illness   Past Medical History:  He,  has a past medical history of Anemia, Basal cell carcinoma, BPH (benign prostatic hyperplasia), CAD (coronary artery disease), COPD (chronic obstructive pulmonary disease) ([place]), GERD (gastroesophageal reflux disease), Macular degeneration, Pure hypercholesterolemia, Stroke ([place]), Tobacco abuse, Traumatic amputation of leg(s) (complete) (partial), unilateral, level not specified, without mention of complication (62/69/4854), and Unspecified essential hypertension.   Surgical History:   Past Surgical History:  Procedure Laterality Date   AMPUTATION Right 08/26/2011   Below Knee Amputee   APPENDECTOMY     CARDIAC CATHETERIZATION     CAROTID ANGIOGRAPHY Right 05/14/2016   Procedure: Carotid Angiography;  Surgeon: Katha Cabal, MD;  Location: [place] [facility];  Service: Cardiovascular;  Laterality: Right;   CAROTID PTA/STENT INTERVENTION  05/14/2016   Procedure: Carotid PTA/Stent Intervention;  Surgeon: Katha Cabal, MD;  Location: [place] [facility];  Service: Cardiovascular;;   CORONARY ANGIOPLASTY WITH STENT PLACEMENT  09/28/2006   RCA   CORONARY ANGIOPLASTY WITH STENT PLACEMENT  07/21/2006   Mid LAD   CORONARY ANGIOPLASTY WITH STENT PLACEMENT  06/06/2013   30% prox LAD at the site of a prior stent, 40% mid LAD at the origin of D1, 40% ostial D1, 30% prox LCx at the site of a prior stent, in the proximal third of the vessel  segment, 20% prox RCA, 99% distal RCA s/p DES. EF >55%.     Social History:   reports that he has been smoking cigarettes. He has a 60.00 pack-year smoking history. He has never used smokeless tobacco. He reports that he does not drink alcohol and does not use drugs.   Family History:  His family history includes Mental illness in his mother.   Allergies No Known Allergies   Home Medications  Prior to Admission medications  Medication Sig Start Date End Date Taking? Authorizing Provider  albuterol (VENTOLIN HFA) 108 (90 Base) MCG/ACT inhaler Inhale 2 puffs into the lungs every 6 (six) hours as needed for wheezing or shortness of breath. 03/03/22 06/01/22  Fritzi Mandes, MD  amLODipine (NORVASC) 10 MG tablet TAKE ONE-HALF TABLET BY MOUTH EVERY DAY FOR BLOOD PRESSURE 11/26/21   [provider]  amoxicillin-clavulanate (AUGMENTIN) 875-125 MG tablet Take 1 tablet by mouth every 12 (twelve) hours for 6 days. 03/03/22 03/09/22  Fritzi Mandes, MD  aspirin EC 81 MG tablet Take 1 tablet (81 mg total) by mouth daily. 12/01/18   Minna Merritts, MD  atorvastatin (LIPITOR) 40 MG tablet TAKE 1 TABLET BY MOUTH EVERY DAY 10/06/18   Karamalegos, Devonne Doughty, DO  azithromycin (ZITHROMAX) 500 MG tablet Take 1 tablet (500 mg total) by mouth daily for 3 days. 03/04/22 02/27/2022  Fritzi Mandes, MD  Budeson-Glycopyrrol-Formoterol (BREZTRI AEROSPHERE) 160-9-4.8 MCG/ACT AERO Inhale 2 puffs into the lungs in the morning and at bedtime. 04/08/21   Tyler Pita, MD  Cholecalciferol 50 MCG (2000 UT) TABS Take 1 tablet by mouth daily. 08/15/21   [provider]  clopidogrel (PLAVIX) 75 MG tablet Take 1 tablet (75 mg total) by mouth daily for 21 days. 03/04/22 03/25/22  Fritzi Mandes, MD  furosemide (LASIX) 20 MG tablet Take 1 tablet (20 mg total) by mouth daily as needed. 07/30/21   Minna Merritts, MD  ipratropium (ATROVENT) 0.06 % nasal spray Place 2 sprays into both nostrils 4 (four) times daily. 04/08/21    Tyler Pita, MD  ipratropium-albuterol (DUONEB) 0.5-2.5 (3) MG/3ML SOLN USE 1 NEBULIZER SOLUTION (3ML) BY ORAL INHALATION EVERY 6 HOURS AS NEEDED FOR SHORTNESS OF BREATH 09/23/21   [provider]  ketoconazole (NIZORAL) 2 % cream APPLY SUFFICIENT AMOUNT TOPICALLY TWO TIMES A DAY TO RASH ON FOOT. USE FOR 2 WEEKS, THEN DISCONTINUE 11/26/21   [provider]  lisinopril (ZESTRIL) 40 MG tablet TAKE ONE-HALF TABLET BY MOUTH EVERY DAY FOR BLOOD PRESSURE 05/14/21   [provider]  metFORMIN (GLUCOPHAGE) 500 MG tablet Take 1,000 mg by mouth 2 (two) times daily with a meal.    [provider]  metoprolol tartrate (LOPRESSOR) 50 MG tablet TAKE 1 TABLET BY MOUTH TWICE A DAY 10/30/21   Gollan, Kathlene November, MD  nitroGLYCERIN (NITROSTAT) 0.4 MG SL tablet Place 1 tablet (0.4 mg total) under the tongue every 5 (five) minutes as needed for chest pain. 10/13/17   Minna Merritts, MD  predniSONE (DELTASONE) 5 MG tablet Take 1 tablet by mouth daily. 11/26/21   [provider]  senna-docusate (SENOKOT-S) 8.6-50 MG tablet Take 1 tablet by mouth 2 (two) times daily. 03/03/22   Fritzi Mandes, MD  tamsulosin (FLOMAX) 0.4 MG CAPS capsule TAKE ONE CAPSULE BY MOUTH EVERY EVENING TO REPLACE TERAZOSIN 11/15/21   [provider]     Critical care provider statement:   Total critical care time: 33 minutes   Performed by: Lanney Gins MD   Critical care time was exclusive of separately billable procedures and treating other patients.   Critical care was necessary to treat or prevent imminent or life-threatening deterioration.   Critical care was time spent personally by me on the following activities: development of treatment plan with patient and/or surrogate as well as nursing, discussions with consultants, evaluation of patient's response to treatment, examination of patient, obtaining history from patient or surrogate, ordering and performing treatments and interventions,  ordering and review of  laboratory studies, ordering and review of radiographic studies, pulse oximetry and re-evaluation of patient's condition.    Ottie Glazier, M.D.  Pulmonary & Critical Care Medicine

## 2022-03-16 NOTE — Consult Note (Signed)
PHARMACY CONSULT NOTE - ELECTROLYTES  Pharmacy Consult for Electrolyte Monitoring and Replacement   Recent Labs: Potassium (mmol/L)  Date Value  03/16/2022 4.0  06/07/2013 3.1 (L)   Magnesium (mg/dL)  Date Value  03/16/2022 2.2   Calcium (mg/dL)  Date Value  03/16/2022 8.0 (L)   Calcium, Total (mg/dL)  Date Value  06/07/2013 8.5   Albumin (g/dL)  Date Value  03/16/2022 2.4 (L)  12/29/2013 4.7  06/01/2013 4.0   Phosphorus (mg/dL)  Date Value  03/16/2022 4.6  03/16/2022 4.7 (H)   Sodium  Date Value  03/16/2022 144 mmol/L  07/07/2017 140  06/07/2013 141 mmol/L   Corrected Ca: 9.9 mg/dL  Assessment  Stanley Mclean is a 81 y.o. male presenting with pneumonia. PMH significant for CAD, HTN, COPD, and LUL cavitary lesion. Pharmacy has been consulted to monitor and replace electrolytes while in CCU. On Amio infusion changed to amio 200 mg BID.   Diet: Vital AF at 40 mL/hr + FWF at 100 mL q4h  MIVF: lactated ringers at 75 mL/hr - stopped.   Goal of Therapy: Electrolytes WNL. Aim for K+ >4 Mg > 2.   Plan:  No replacement required today.  Check BMP, Phos, Mg with AM labs  Thank you for allowing pharmacy to be a part of this patient's care.  Eleonore Chiquito, PharmD, BCPS 03/16/2022 9:17 AM

## 2022-03-17 DIAGNOSIS — J189 Pneumonia, unspecified organism: Secondary | ICD-10-CM | POA: Diagnosis not present

## 2022-03-17 DIAGNOSIS — J9621 Acute and chronic respiratory failure with hypoxia: Secondary | ICD-10-CM | POA: Diagnosis not present

## 2022-03-17 DIAGNOSIS — J9622 Acute and chronic respiratory failure with hypercapnia: Secondary | ICD-10-CM | POA: Diagnosis not present

## 2022-03-17 DIAGNOSIS — J1100 Influenza due to unidentified influenza virus with unspecified type of pneumonia: Secondary | ICD-10-CM | POA: Diagnosis not present

## 2022-03-17 DIAGNOSIS — J984 Other disorders of lung: Secondary | ICD-10-CM | POA: Diagnosis not present

## 2022-03-17 DIAGNOSIS — J441 Chronic obstructive pulmonary disease with (acute) exacerbation: Secondary | ICD-10-CM | POA: Diagnosis not present

## 2022-03-17 LAB — CBC
HCT: 22.5 % — ABNORMAL LOW (ref 39.0–52.0)
Hemoglobin: 6.7 g/dL — ABNORMAL LOW (ref 13.0–17.0)
MCH: 30.6 pg (ref 26.0–34.0)
MCHC: 29.8 g/dL — ABNORMAL LOW (ref 30.0–36.0)
MCV: 102.7 fL — ABNORMAL HIGH (ref 80.0–100.0)
Platelets: 150 10*3/uL (ref 150–400)
RBC: 2.19 MIL/uL — ABNORMAL LOW (ref 4.22–5.81)
RDW: 14.5 % (ref 11.5–15.5)
WBC: 9.8 10*3/uL (ref 4.0–10.5)
nRBC: 0 % (ref 0.0–0.2)

## 2022-03-17 LAB — GLUCOSE, CAPILLARY
Glucose-Capillary: 247 mg/dL — ABNORMAL HIGH (ref 70–99)
Glucose-Capillary: 231 mg/dL — ABNORMAL HIGH (ref 70–99)
Glucose-Capillary: 204 mg/dL — ABNORMAL HIGH (ref 70–99)
Glucose-Capillary: 165 mg/dL — ABNORMAL HIGH (ref 70–99)
Glucose-Capillary: 125 mg/dL — ABNORMAL HIGH (ref 70–99)
Glucose-Capillary: 171 mg/dL — ABNORMAL HIGH (ref 70–99)

## 2022-03-17 LAB — RENAL FUNCTION PANEL
Albumin: 2 g/dL — ABNORMAL LOW (ref 3.5–5.0)
Anion gap: 14 (ref 5–15)
BUN: 92 mg/dL — ABNORMAL HIGH (ref 8–23)
CO2: 27 mmol/L (ref 22–32)
Calcium: 8 mg/dL — ABNORMAL LOW (ref 8.9–10.3)
Chloride: 99 mmol/L (ref 98–111)
Creatinine, Ser: 4.92 mg/dL — ABNORMAL HIGH (ref 0.61–1.24)
GFR, Estimated: 11 mL/min — ABNORMAL LOW (ref >60)
Glucose, Bld: 234 mg/dL — ABNORMAL HIGH (ref 70–99)
Phosphorus: 5.6 mg/dL — ABNORMAL HIGH (ref 2.5–4.6)
Potassium: 4.1 mmol/L (ref 3.5–5.1)
Sodium: 140 mmol/L (ref 135–145)

## 2022-03-17 LAB — MAGNESIUM: Magnesium: 2.3 mg/dL (ref 1.7–2.4)

## 2022-03-17 NOTE — Progress Notes (Signed)
Spoke with the patients daughter, Armando Reichert and she states that her mother would like to talk to the palliative care team before they transition the patient to comfort care. She stated that they have been told about palliative care for at least 6 days but no one has called to speak with them. I reassured the daughter that the ICU doctor could order comfort care but she has been told that Palliative Care strictly takes care of comfort care patients and she would like to speak with Palliative Care as soon as possible.Will advise day shift RN so that she may follow up on the with the palliative care team.

## 2022-03-17 NOTE — Progress Notes (Signed)
Patient will be transitioning to comfort care and tube feeds have been discontinued per order from Stanley Falco, NP

## 2022-03-17 NOTE — Consult Note (Signed)
PHARMACY CONSULT NOTE - ELECTROLYTES  Pharmacy Consult for Electrolyte Monitoring and Replacement   Recent Labs: Potassium (mmol/L)  Date Value  03/17/2022 4.1  06/07/2013 3.1 (L)   Magnesium (mg/dL)  Date Value  03/17/2022 2.3   Calcium (mg/dL)  Date Value  03/17/2022 8.0 (L)   Calcium, Total (mg/dL)  Date Value  06/07/2013 8.5   Albumin (g/dL)  Date Value  03/17/2022 2.0 (L)  12/29/2013 4.7  06/01/2013 4.0   Phosphorus (mg/dL)  Date Value  03/17/2022 5.6 (H)   Sodium  Date Value  03/17/2022 140 mmol/L  07/07/2017 140  06/07/2013 141 mmol/L   Corrected Ca: 9.9 mg/dL  Assessment  Stanley Mclean is a 82 y.o. male presenting with pneumonia. PMH significant for CAD, HTN, COPD, and LUL cavitary lesion. Pharmacy has been consulted to monitor and replace electrolytes while in CCU. On Amio infusion changed to amio 200 mg BID.   Diet: Vital AF at 50 mL/hr + FWF at 100 mL q4h  MIVF: none  Goal of Therapy: Electrolytes WNL. Aim for K+ >4 Mg > 2.   Plan:  No replacement required today.  Check BMP, Phos, Mg with AM labs  Thank you for allowing pharmacy to be a part of this patient's care.  Chinita Greenland PharmD Clinical Pharmacist 03/17/2022

## 2022-03-17 NOTE — Progress Notes (Signed)
NAME:  Stanley Mclean, MRN:  [id number], DOB:  Jul 26, 1940, LOS: 71 ADMISSION DATE:  02/15/2022, CONSULTATION DATE:  02/16/2022 REFERRING MD:  Dr. Cherylann Banas, CHIEF COMPLAINT: Shortness of breath  Brief Pt Description / Synopsis:  82 year old male admitted with Severe Sepsis and acute on chronic hypoxic and hypercapnic respiratory failure in the setting of AECOPD due to influenza A infection, and LUL Cavitary lesion requiring intubation and mechanical ventilation.  History of Present Illness:  Stanley Mclean is a 82 year old male with a past medical history significant for COPD requiring 4 L supplemental oxygen at baseline, CAD, hypertension, diabetes mellitus, stroke who presents to [facility] ED on 03/13/2022 due to shortness of breath.  Patient currently is intubated and currently no family is available to contribute to history, therefore history obtained from chart review.  Per ED and nursing notes, the patient's family contacted EMS due to shortness of breath.  Upon EMS arrival he was noted to be hypoxic with O2 saturations of 82% on his baseline 4 L nasal cannula.  EMS noted severe wheezing with poor air movement of which she was given 125 mg Solu-Medrol, 4 g of magnesium, and 3 DuoNeb's.  Upon arrival to the ED he continued to have respiratory distress, only able to talk in single words.   ED Course: Initial vital signs: Temperature 98, respiratory rate 23, pulse 125, blood pressure 96/65 Significant Labs: Bicarb 34, glucose 188, BUN 22, creatinine 1.24, AST 45, high-sensitivity troponin 34, lactic acid 1.8, procalcitonin 0.11, WBC 10.6, hemoglobin 11.3, hematocrit 36.8 Positive for influenza A Urinalysis negative for UTI Imaging Chest X-ray>>FINDINGS: The heart size and mediastinal contours are within normal limits. Nasogastric tube is seen in right mainstem bronchus. Endotracheal tube is in good position. Bilateral lung opacities are again noted concerning for multifocal pneumonia. The  visualized skeletal structures are unremarkable CT Chest w/o Contrast>>IMPRESSION: 1. Extensive bilateral heterogeneous ground-glass and consolidative airspace opacity throughout the lungs is slightly worsened compared to prior examination. Findings are consistent with worsened multifocal infection. 2. No significant change in a serpiginous, thick-walled cavitary lesion about the peripheral left lung, the largest axial component at the posterolateral left upper lobe measuring 4.4 x 2.5 cm. 3. Endotracheal intubation. 4. Severe emphysema and diffuse bilateral bronchial wall thickening. 5. Frothy debris in the lower trachea and left mainstem bronchus. 6. Coronary artery disease. 7. Nonobstructive right nephrolithiasis. Medications Administered: 1 L normal saline bolus, IV cefepime, vancomycin, azithromycin  While in the ED his mental status worsened which he became minimally responsive and somnolent which he was subsequently intubated by the ED provider.  PCCM is asked to admit the patient for further workup and treatment.  03/12/22- patient has had intermittent episodes of desaturation requiring BIPAP and has increased thickened secretions.   03/13/22- patient intubated due to worsening hypoxemia and distress, plan for bronchoscopy today due to worsening secretions.  We called and spoke to daughter and wife to update family.  03/14/22- s/p BAL with GNR growing overnight, have narrowed abx to Zosyn. Procal trending up. He received 3L IVF with resultant dilutional CBC. He's on PRVC at 60FiO2, working on weaning vasopressors with levophed/vasopressin, CVP is trending. Cytology BAL from LUL in process. Family meeting today.  03/15/22- Patient with no overnight events, BAL with gram negative rods, cytology negative for atypia.  Worsening renal function and thrombocytopenia consistent with sepsis due to post Influenza bacterial pneumonia. Have broadened coverage for double pseudomonas.  03/16/22-  patient is DNR now and he is doing poorly.  Renal failure  is worse , his family does not wish to initiate dialysis.  GOC was performed and comfort care plan is in order per family wishes.  03/17/22- patient showing no signs of improvement with progressively worsening multi organ failure in septic shock.  Family in process of transitioning to comfort care and patient is DNR. Pseudomonas from bronchoscopy is multi drug resistant.   Pertinent  Medical History   Past Medical History:  Diagnosis Date   Anemia    Basal cell carcinoma    BPH (benign prostatic hyperplasia)    CAD (coronary artery disease)    a. s/p multiple prior PCIs b. s/p DES-distal RCA 05/2013   COPD (chronic obstructive pulmonary disease) (HCC)    GERD (gastroesophageal reflux disease)    Macular degeneration    Pure hypercholesterolemia    Stroke ([place])    Tobacco abuse    Traumatic amputation of leg(s) (complete) (partial), unilateral, level not specified, without mention of complication 32/44/0102   Right Below Knee Amputation   Unspecified essential hypertension      Micro Data:  12/22: SARS-CoV-2 PCR>> negative 12/22: + Influenza A 12/22: Blood culture>>NGTD 12/22: Strep pneumo urinary antigen>>negative 12/22: Legionella urinary antigen>> 12/22: Mycoplasma pneumoniae>> 12/22: MRSA PCR>>negative 12/22: Tracheal aspirate>>not collected 12/22: Fungitell>> 12/23: Cryptococcus Antigen>> 12/23: Aspergillus>> 12/31- MDR pseudomonas +  Antimicrobials:  Cefepime 12/22 x 1 dose Azithromycin 12/22>> Zosyn 12/22>>  Significant Hospital Events: Including procedures, antibiotic start and stop dates in addition to other pertinent events   12/22: Presented to ED, required intubation in the ED.  PCCM asked to admit 12/23: remains intubated, comfortable  12/24: On minimal vent settings. Tolerating SBT ~ EXTUBATE to BiPAP.    Objective   Blood pressure 121/62, pulse 93, temperature 97.9 F (36.6 C), temperature source  Esophageal, resp. rate 18, height _0  (1.753 m), weight 59.4 kg, SpO2 93 %. CVP:  [0 mmHg-10 mmHg] 7 mmHg  Vent Mode: PRVC FiO2 (%):  [50 %-60 %] 50 % Set Rate:  [18 bmp] 18 bmp Vt Set:  [500 mL] 500 mL PEEP:  [5 cmH20] 5 cmH20 Pressure Support:  [10 cmH20] 10 cmH20 Plateau Pressure:  [17 cmH20-20 cmH20] 19 cmH20   Intake/Output Summary (Last 24 hours) at 03/17/2022 1251 Last data filed at 03/17/2022 1141 Gross per 24 hour  Intake 2526.33 ml  Output 55 ml  Net 2471.33 ml    Filed Weights   03/15/22 0500 03/16/22 0500 03/17/22 0415  Weight: 53.9 kg 53.8 kg 59.4 kg    Examination: General: Acute on chronically ill-appearing male, laying in bed, intubated, on Precedex awake and following commands, no acute distress HENT: Atraumatic, normocephalic, neck supple, no JVD, orally intubated Lungs: Coarse breath sounds bilaterally, no wheezing noted, even, overbreathes the vent Cardiovascular: Regular rate and rhythm, S1-S2, no murmurs, rubs, gallops Abdomen: Soft, nontender, nondistended, no guarding rebound tenderness, bowel sounds positive x 4 Extremities: Normal bulk and tone, no deformities, no edema Neuro: Lightly Sedated, awake and alert, following commands, no focal deficits, pupils PERRLA GU: Foley catheter draining yellow urine  Resolved Hospital Problem list     Assessment & Plan:   Acute on chronic Hypoxic & Hypercapnic Respiratory Failure in setting of AECOPD due to influenza A infection  LUL Cavitary Lesion -Full vent support, implement lung protective strategies -Plateau pressures less than 30 cm H20 -Wean FiO2 & PEEP as tolerated to maintain O2 sats >92% -Follow intermittent Chest X-ray & ABG as needed -Spontaneous Breathing Trials when respiratory parameters met and mental status  permits -Implement VAP Bundle -Bronchodilators and Pulmicort nebs -IV Solu-Medrol has been DCD -Antibiotics -patient with multi drug resistant cavitary pseudomonas pneumonia and Influenza  A   Septic shock      Present on admission due to pneumonia with influenza A and pseudomonas species -Monitor fever curve -Trend WBC's & Procalcitonin -Follow cultures as above -Continue empiric levoquin and zosyn and Tamiflu pending cultures & sensitivities  Hypotension: suspect sedation related vs sepsis Mildly elevated troponin, suspect demand ischemia PMHx: HTN, HLD, CAD s/p stent Echocardiogram 03/03/2022: LVEF 55 to 36%, grade 2 diastolic dysfunction, RV systolic function normal -Continuous cardiac monitoring -Maintain MAP >65 -Vasopressors as needed to maintain MAP goal -Lactic acid has normalized -Trend HS Troponin until peaked (34 ~ 48 ~) -Diuresis as BP and renal function permits ~ holding due to AKI and low dose pressors  Acute Kidney Injury -Monitor I&O's / urinary output -Follow BMP -Ensure adequate renal perfusion -Avoid nephrotoxic agents as able -Replace electrolytes as indicated -Low threshold for Nephrology consultation  Anemia of chronic disease -Monitor for S/Sx of bleeding -Trend CBC -Heparin subcu for VTE Prophylaxis  -Transfuse for Hgb <7  Diabetes Mellitus Type II -CBG's q4h; Target range of 140 to 180 -SSI -Follow ICU Hypo/Hyperglycemia protocol  Acute metabolic encephalopathy, suspect CO2 narcosis Sedation needs in the setting of mechanical ventilation PMHx: small left PCA territory stroke  -Treatment of hypercapnia and metabolic derangements as outlined above -Maintain a RASS goal of 0 to -1 -Propofol as needed to maintain RASS goal -Avoid sedating medications as able -Daily wake up assessment -Aspirin and Plavix    Patient is critically ill with multiple chronic comorbidities.  Prognosis is guarded.  Recommend DNR status.   Best Practice (right click and "Reselect all SmartList Selections" daily)   Diet/type: NPO, tube feeds DVT prophylaxis: prophylactic heparin  GI prophylaxis: PPI Lines: N/A Foley:  Yes, and it is still  needed Code Status:  full code Last date of multidisciplinary goals of care discussion [12/24]    Labs   CBC: Recent Labs  Lab 03/13/22 0433 03/14/22 0648 03/15/22 0337 03/16/22 0225 03/17/22 0433  WBC 9.2 8.9 10.8* 11.5* 9.8  NEUTROABS  --  8.2*  --   --   --   HGB 10.7* 7.6* 7.4* 7.5* 6.7*  HCT 35.6* 25.9* 24.5* 25.5* 22.5*  MCV 102.3* 104.4* 104.3* 104.1* 102.7*  PLT 258 143* 111* 145* 150     Basic Metabolic Panel: Recent Labs  Lab 03/11/22 0541 03/12/22 0432 03/13/22 0433 03/13/22 1725 03/14/22 0648 03/15/22 0337 03/16/22 0225 03/17/22 0433  NA 152*   < > 152* 149* 145 142 144 140  K 4.1   < > 3.5 3.4* 3.6 3.4* 4.0 4.1  CL 110   < > 111 111 107 104 105 99  CO2 31   < > 32 34* _0 GLUCOSE 125*   < > 176* 250* 184* 285* 147* 234*  BUN 52*   < > 52* 61* 65* 73* 89* 92*  CREATININE 1.49*   < > 1.50* 2.13* 2.34* 2.90* 3.64* 4.92*  CALCIUM 8.7*   < > 8.9 7.9* 8.1* 8.0* 8.0* 8.0*  MG 2.5*   < > 2.3  --  2.0 2.0 2.2 2.3  PHOS 3.7  --   --   --  3.9 3.7  3.6 4.7*  4.6 5.6*   < > = values in this interval not displayed.    GFR: Estimated Creatinine Clearance: 10.2 mL/min (A) (by  C-G formula based on SCr of 4.92 mg/dL (H)). Recent Labs  Lab 03/12/22 0432 03/13/22 0433 03/13/22 1559 03/14/22 0648 03/15/22 0337 03/16/22 0225 03/17/22 0433  PROCALCITON 0.40  --  3.79  --  19.64 20.65  --   WBC  --    < >  --  8.9 10.8* 11.5* 9.8  LATICACIDVEN  --   --  1.5  --   --   --   --    < > = values in this interval not displayed.     Liver Function Tests: Recent Labs  Lab 03/15/22 0337 03/16/22 0225 03/17/22 0433  ALBUMIN 2.5* 2.4* 2.0*    No results for input(s): "LIPASE", "AMYLASE" in the last 168 hours. No results for input(s): "AMMONIA" in the last 168 hours.  ABG    Component Value Date/Time   PHART 7.34 (L) 03/14/2022 0431   PCO2ART 69 (HH) 03/14/2022 0431   PO2ART 164 (H) 03/14/2022 0431   HCO3 37.2 (H) 03/14/2022 0431   O2SAT 100  03/14/2022 0431     Coagulation Profile: No results for input(s): "INR", "PROTIME" in the last 168 hours.   Cardiac Enzymes: No results for input(s): "CKTOTAL", "CKMB", "CKMBINDEX", "TROPONINI" in the last 168 hours.  HbA1C: Hemoglobin A1C  Date/Time Value Ref Range Status  07/07/2017 12:00 AM 8.2  Final  06/18/2016 03:18 PM 7.9  Final   Hgb A1c MFr Bld  Date/Time Value Ref Range Status  03/08/2022 04:45 AM 8.9 (H) 4.8 - 5.6 % Final    Comment:    (NOTE)         Prediabetes: 5.7 - 6.4         Diabetes: >6.4         Glycemic control for adults with diabetes: <7.0   03/01/2022 06:45 AM 9.0 (H) 4.8 - 5.6 % Final    Comment:    (NOTE)         Prediabetes: 5.7 - 6.4         Diabetes: >6.4         Glycemic control for adults with diabetes: <7.0     CBG: Recent Labs  Lab 03/16/22 1924 03/16/22 2316 03/17/22 0355 03/17/22 0733 03/17/22 1146  GLUCAP 206* 240* 247* 231* 204*     Review of Systems:   Unable to assess due to intubation/sedation/critical illness   Past Medical History:  He,  has a past medical history of Anemia, Basal cell carcinoma, BPH (benign prostatic hyperplasia), CAD (coronary artery disease), COPD (chronic obstructive pulmonary disease) ([place]), GERD (gastroesophageal reflux disease), Macular degeneration, Pure hypercholesterolemia, Stroke ([place]), Tobacco abuse, Traumatic amputation of leg(s) (complete) (partial), unilateral, level not specified, without mention of complication (01/05/1172), and Unspecified essential hypertension.   Surgical History:   Past Surgical History:  Procedure Laterality Date   AMPUTATION Right 08/26/2011   Below Knee Amputee   APPENDECTOMY     CARDIAC CATHETERIZATION     CAROTID ANGIOGRAPHY Right 05/14/2016   Procedure: Carotid Angiography;  Surgeon: Katha Cabal, MD;  Location: [facility];  Service: Cardiovascular;  Laterality: Right;   CAROTID PTA/STENT INTERVENTION  05/14/2016   Procedure: Carotid  PTA/Stent Intervention;  Surgeon: Katha Cabal, MD;  Location: [facility];  Service: Cardiovascular;;   CORONARY ANGIOPLASTY WITH STENT PLACEMENT  09/28/2006   RCA   CORONARY ANGIOPLASTY WITH STENT PLACEMENT  07/21/2006   Mid LAD   CORONARY ANGIOPLASTY WITH STENT PLACEMENT  06/06/2013   30% prox LAD at the  site of a prior stent, 40% mid LAD at the origin of D1, 40% ostial D1, 30% prox LCx at the site of a prior stent, in the proximal third of the vessel segment, 20% prox RCA, 99% distal RCA s/p DES. EF >55%.     Social History:   reports that he has been smoking cigarettes. He has a 60.00 pack-year smoking history. He has never used smokeless tobacco. He reports that he does not drink alcohol and does not use drugs.   Family History:  His family history includes Mental illness in his mother.   Allergies No Known Allergies   Home Medications  Prior to Admission medications   Medication Sig Start Date End Date Taking? Authorizing Provider  albuterol (VENTOLIN HFA) 108 (90 Base) MCG/ACT inhaler Inhale 2 puffs into the lungs every 6 (six) hours as needed for wheezing or shortness of breath. 03/03/22 06/01/22  Fritzi Mandes, MD  amLODipine (NORVASC) 10 MG tablet TAKE ONE-HALF TABLET BY MOUTH EVERY DAY FOR BLOOD PRESSURE 11/26/21   [provider]  amoxicillin-clavulanate (AUGMENTIN) 875-125 MG tablet Take 1 tablet by mouth every 12 (twelve) hours for 6 days. 03/03/22 03/09/22  Fritzi Mandes, MD  aspirin EC 81 MG tablet Take 1 tablet (81 mg total) by mouth daily. 12/01/18   Minna Merritts, MD  atorvastatin (LIPITOR) 40 MG tablet TAKE 1 TABLET BY MOUTH EVERY DAY 10/06/18   Karamalegos, Devonne Doughty, DO  azithromycin (ZITHROMAX) 500 MG tablet Take 1 tablet (500 mg total) by mouth daily for 3 days. 03/04/22 02/18/2022  Fritzi Mandes, MD  Budeson-Glycopyrrol-Formoterol (BREZTRI AEROSPHERE) 160-9-4.8 MCG/ACT AERO Inhale 2 puffs into the lungs in the morning and at bedtime. 04/08/21    Tyler Pita, MD  Cholecalciferol 50 MCG (2000 UT) TABS Take 1 tablet by mouth daily. 08/15/21   [provider]  clopidogrel (PLAVIX) 75 MG tablet Take 1 tablet (75 mg total) by mouth daily for 21 days. 03/04/22 03/25/22  Fritzi Mandes, MD  furosemide (LASIX) 20 MG tablet Take 1 tablet (20 mg total) by mouth daily as needed. 07/30/21   Minna Merritts, MD  ipratropium (ATROVENT) 0.06 % nasal spray Place 2 sprays into both nostrils 4 (four) times daily. 04/08/21   Tyler Pita, MD  ipratropium-albuterol (DUONEB) 0.5-2.5 (3) MG/3ML SOLN USE 1 NEBULIZER SOLUTION (3ML) BY ORAL INHALATION EVERY 6 HOURS AS NEEDED FOR SHORTNESS OF BREATH 09/23/21   [provider]  ketoconazole (NIZORAL) 2 % cream APPLY SUFFICIENT AMOUNT TOPICALLY TWO TIMES A DAY TO RASH ON FOOT. USE FOR 2 WEEKS, THEN DISCONTINUE 11/26/21   [provider]  lisinopril (ZESTRIL) 40 MG tablet TAKE ONE-HALF TABLET BY MOUTH EVERY DAY FOR BLOOD PRESSURE 05/14/21   [provider]  metFORMIN (GLUCOPHAGE) 500 MG tablet Take 1,000 mg by mouth 2 (two) times daily with a meal.    [provider]  metoprolol tartrate (LOPRESSOR) 50 MG tablet TAKE 1 TABLET BY MOUTH TWICE A DAY 10/30/21   Gollan, Kathlene November, MD  nitroGLYCERIN (NITROSTAT) 0.4 MG SL tablet Place 1 tablet (0.4 mg total) under the tongue every 5 (five) minutes as needed for chest pain. 10/13/17   Minna Merritts, MD  predniSONE (DELTASONE) 5 MG tablet Take 1 tablet by mouth daily. 11/26/21   [provider]  senna-docusate (SENOKOT-S) 8.6-50 MG tablet Take 1 tablet by mouth 2 (two) times daily. 03/03/22   Fritzi Mandes, MD  tamsulosin (FLOMAX) 0.4 MG CAPS capsule TAKE ONE CAPSULE BY MOUTH EVERY  EVENING TO REPLACE TERAZOSIN 11/15/21   [provider]     Critical care provider statement:   Total critical care time: 33 minutes   Performed by: Lanney Gins MD   Critical care time was exclusive of separately billable procedures and  treating other patients.   Critical care was necessary to treat or prevent imminent or life-threatening deterioration.   Critical care was time spent personally by me on the following activities: development of treatment plan with patient and/or surrogate as well as nursing, discussions with consultants, evaluation of patient's response to treatment, examination of patient, obtaining history from patient or surrogate, ordering and performing treatments and interventions, ordering and review of laboratory studies, ordering and review of radiographic studies, pulse oximetry and re-evaluation of patient's condition.    Ottie Glazier, M.D.  Pulmonary & Critical Care Medicine

## 2022-03-17 NOTE — Progress Notes (Signed)
Family is planning on transitioning to comfort care per report. Verbal order received to discontinue tube feeds after midnight from Rufina Falco, NP

## 2022-03-17 DEATH — deceased

## 2022-03-18 ENCOUNTER — Encounter: Payer: Self-pay | Admitting: Student in an Organized Health Care Education/Training Program

## 2022-03-18 DIAGNOSIS — J9602 Acute respiratory failure with hypercapnia: Secondary | ICD-10-CM | POA: Diagnosis not present

## 2022-03-18 DIAGNOSIS — Z515 Encounter for palliative care: Secondary | ICD-10-CM

## 2022-03-18 DIAGNOSIS — J9601 Acute respiratory failure with hypoxia: Secondary | ICD-10-CM | POA: Diagnosis not present

## 2022-03-18 DIAGNOSIS — Z7189 Other specified counseling: Secondary | ICD-10-CM

## 2022-03-18 LAB — RENAL FUNCTION PANEL
Albumin: 2 g/dL — ABNORMAL LOW (ref 3.5–5.0)
Anion gap: 15 (ref 5–15)
BUN: 116 mg/dL — ABNORMAL HIGH (ref 8–23)
CO2: 24 mmol/L (ref 22–32)
Calcium: 8.3 mg/dL — ABNORMAL LOW (ref 8.9–10.3)
Chloride: 102 mmol/L (ref 98–111)
Creatinine, Ser: 5.5 mg/dL — ABNORMAL HIGH (ref 0.61–1.24)
GFR, Estimated: 10 mL/min — ABNORMAL LOW (ref >60)
Glucose, Bld: 143 mg/dL — ABNORMAL HIGH (ref 70–99)
Phosphorus: 6.3 mg/dL — ABNORMAL HIGH (ref 2.5–4.6)
Potassium: 4.1 mmol/L (ref 3.5–5.1)
Sodium: 141 mmol/L (ref 135–145)

## 2022-03-18 LAB — CBC
HCT: 20.8 % — ABNORMAL LOW (ref 39.0–52.0)
Hemoglobin: 6.4 g/dL — ABNORMAL LOW (ref 13.0–17.0)
MCH: 30.8 pg (ref 26.0–34.0)
MCHC: 30.8 g/dL (ref 30.0–36.0)
MCV: 100 fL (ref 80.0–100.0)
Platelets: 139 10*3/uL — ABNORMAL LOW (ref 150–400)
RBC: 2.08 MIL/uL — ABNORMAL LOW (ref 4.22–5.81)
RDW: 14.6 % (ref 11.5–15.5)
WBC: 7.2 10*3/uL (ref 4.0–10.5)
nRBC: 0 % (ref 0.0–0.2)

## 2022-03-18 LAB — GLUCOSE, CAPILLARY
Glucose-Capillary: 147 mg/dL — ABNORMAL HIGH (ref 70–99)
Glucose-Capillary: 115 mg/dL — ABNORMAL HIGH (ref 70–99)
Glucose-Capillary: 121 mg/dL — ABNORMAL HIGH (ref 70–99)

## 2022-03-18 LAB — MAGNESIUM: Magnesium: 2.4 mg/dL (ref 1.7–2.4)

## 2022-03-18 MED ORDER — SODIUM CHLORIDE 0.9 % IV SOLN
500.0000 mg | INTRAVENOUS | Status: DC
  Filled 2022-03-18: qty 10

## 2022-03-18 MED ORDER — ACETAMINOPHEN 325 MG PO TABS: 650.0000 mg | ORAL_TABLET | Freq: Four times a day (QID) | ORAL | Status: DC | PRN

## 2022-03-18 MED ORDER — LORAZEPAM 2 MG/ML PO CONC: 1.0000 mg | ORAL | Status: DC | PRN

## 2022-03-18 MED ORDER — BIOTENE DRY MOUTH MT LIQD: 15.0000 mL | OROMUCOSAL | Status: DC | PRN

## 2022-03-18 MED ORDER — ONDANSETRON HCL 4 MG/2ML IJ SOLN: 4.0000 mg | Freq: Four times a day (QID) | INTRAMUSCULAR | Status: DC | PRN

## 2022-03-18 MED ORDER — HALOPERIDOL LACTATE 2 MG/ML PO CONC: 0.5000 mg | ORAL | Status: DC | PRN

## 2022-03-18 MED ORDER — GLYCOPYRROLATE 0.2 MG/ML IJ SOLN: 0.2000 mg | INTRAMUSCULAR | Status: DC | PRN

## 2022-03-18 MED ORDER — LORAZEPAM 1 MG PO TABS: 1.0000 mg | ORAL_TABLET | ORAL | Status: DC | PRN

## 2022-03-18 MED ORDER — LORAZEPAM 2 MG/ML IJ SOLN
1.0000 mg | INTRAMUSCULAR | Status: DC | PRN
  Administered 2022-03-18: 2 mg via INTRAVENOUS
  Filled 2022-03-18: qty 1

## 2022-03-18 MED ORDER — ONDANSETRON 4 MG PO TBDP: 4.0000 mg | ORAL_TABLET | Freq: Four times a day (QID) | ORAL | Status: DC | PRN

## 2022-03-18 MED ORDER — IPRATROPIUM-ALBUTEROL 0.5-2.5 (3) MG/3ML IN SOLN: 3.0000 mL | RESPIRATORY_TRACT | Status: DC | PRN

## 2022-03-18 MED ORDER — ACETAMINOPHEN 650 MG RE SUPP: 650.0000 mg | Freq: Four times a day (QID) | RECTAL | Status: DC | PRN

## 2022-03-18 MED ORDER — POLYVINYL ALCOHOL 1.4 % OP SOLN: 1.0000 [drp] | Freq: Four times a day (QID) | OPHTHALMIC | Status: DC | PRN

## 2022-03-18 MED ORDER — GLYCOPYRROLATE 1 MG PO TABS: 1.0000 mg | ORAL_TABLET | ORAL | Status: DC | PRN

## 2022-03-18 MED ORDER — LORAZEPAM 2 MG/ML IJ SOLN: 1.0000 mg | INTRAMUSCULAR | Status: DC | PRN

## 2022-03-18 MED ORDER — HALOPERIDOL 0.5 MG PO TABS: 0.5000 mg | ORAL_TABLET | ORAL | Status: DC | PRN

## 2022-03-18 MED ORDER — MORPHINE SULFATE (PF) 2 MG/ML IV SOLN: 2.0000 mg | INTRAVENOUS | Status: DC | PRN

## 2022-03-18 MED ORDER — HALOPERIDOL LACTATE 5 MG/ML IJ SOLN: 0.5000 mg | INTRAMUSCULAR | Status: DC | PRN

## 2022-03-20 ENCOUNTER — Ambulatory Visit (INDEPENDENT_AMBULATORY_CARE_PROVIDER_SITE_OTHER): Payer: PPO | Admitting: Vascular Surgery

## 2022-03-20 DIAGNOSIS — E119 Type 2 diabetes mellitus without complications: Secondary | ICD-10-CM

## 2022-03-20 DIAGNOSIS — J432 Centrilobular emphysema: Secondary | ICD-10-CM

## 2022-03-20 DIAGNOSIS — I25118 Atherosclerotic heart disease of native coronary artery with other forms of angina pectoris: Secondary | ICD-10-CM

## 2022-03-20 DIAGNOSIS — I10 Essential (primary) hypertension: Secondary | ICD-10-CM

## 2022-03-20 DIAGNOSIS — I6523 Occlusion and stenosis of bilateral carotid arteries: Secondary | ICD-10-CM

## 2022-04-03 LAB — CULTURE, FUNGUS WITHOUT SMEAR

## 2022-04-17 NOTE — Progress Notes (Signed)
NAME:  Stanley Mclean, MRN:  [id number], DOB:  1941-01-04, LOS: 10 ADMISSION DATE:  03/04/2022, CONSULTATION DATE:  02/26/2022 REFERRING MD:  Dr. Cherylann Banas, CHIEF COMPLAINT: Shortness of breath  Brief Pt Description / Synopsis:  82 year old male admitted with Severe Sepsis and acute on chronic hypoxic and hypercapnic respiratory failure in the setting of AECOPD due to influenza A infection, and LUL Cavitary lesion requiring intubation and mechanical ventilation.  History of Present Illness:  Stanley Mclean is a 82 year old male with a past medical history significant for COPD requiring 4 L supplemental oxygen at baseline, CAD, hypertension, diabetes mellitus, stroke who presents to [facility] ED on 02/16/2022 due to shortness of breath.  Patient currently is intubated and currently no family is available to contribute to history, therefore history obtained from chart review.  Per ED and nursing notes, the patient's family contacted EMS due to shortness of breath.  Upon EMS arrival he was noted to be hypoxic with O2 saturations of 82% on his baseline 4 L nasal cannula.  EMS noted severe wheezing with poor air movement of which she was given 125 mg Solu-Medrol, 4 g of magnesium, and 3 DuoNeb's.  Upon arrival to the ED he continued to have respiratory distress, only able to talk in single words.   ED Course: Initial vital signs: Temperature 98, respiratory rate 23, pulse 125, blood pressure 96/65 Significant Labs: Bicarb 34, glucose 188, BUN 22, creatinine 1.24, AST 45, high-sensitivity troponin 34, lactic acid 1.8, procalcitonin 0.11, WBC 10.6, hemoglobin 11.3, hematocrit 36.8 Positive for influenza A Urinalysis negative for UTI Imaging Chest X-ray>>FINDINGS: The heart size and mediastinal contours are within normal limits. Nasogastric tube is seen in right mainstem bronchus. Endotracheal tube is in good position. Bilateral lung opacities are again noted concerning for multifocal pneumonia. The  visualized skeletal structures are unremarkable CT Chest w/o Contrast>>IMPRESSION: 1. Extensive bilateral heterogeneous ground-glass and consolidative airspace opacity throughout the lungs is slightly worsened compared to prior examination. Findings are consistent with worsened multifocal infection. 2. No significant change in a serpiginous, thick-walled cavitary lesion about the peripheral left lung, the largest axial component at the posterolateral left upper lobe measuring 4.4 x 2.5 cm. 3. Endotracheal intubation. 4. Severe emphysema and diffuse bilateral bronchial wall thickening. 5. Frothy debris in the lower trachea and left mainstem bronchus. 6. Coronary artery disease. 7. Nonobstructive right nephrolithiasis. Medications Administered: 1 L normal saline bolus, IV cefepime, vancomycin, azithromycin  While in the ED his mental status worsened which he became minimally responsive and somnolent which he was subsequently intubated by the ED provider.  PCCM is asked to admit the patient for further workup and treatment.  Please see "significant hospital events" for full detailed hospital course.    Pertinent  Medical History   Past Medical History:  Diagnosis Date   Anemia    Basal cell carcinoma    BPH (benign prostatic hyperplasia)    CAD (coronary artery disease)    a. s/p multiple prior PCIs b. s/p DES-distal RCA 05/2013   COPD (chronic obstructive pulmonary disease) (HCC)    GERD (gastroesophageal reflux disease)    Macular degeneration    Pure hypercholesterolemia    Stroke ([place])    Tobacco abuse    Traumatic amputation of leg(s) (complete) (partial), unilateral, level not specified, without mention of complication 94/85/4627   Right Below Knee Amputation   Unspecified essential hypertension      Micro Data:  12/22: SARS-CoV-2 PCR>> negative 12/22: + Influenza A 12/22: Blood culture>>NGTD 12/22:  Strep pneumo urinary antigen>>negative 12/22: Legionella urinary  antigen>> 12/22: Mycoplasma pneumoniae>> 12/22: MRSA PCR>>negative 12/22: Tracheal aspirate>>not collected 12/22: Blood culture x2>>negative 12/22: Fungitell>> 12/23: Cryptococcus Antigen>> 12/23: Aspergillus>> 12/31: BAL: + Pseudomonas (resistant to Ceftazidime and Zosyn)   Antimicrobials:   Anti-infectives (From admission, onward)    Start     Dose/Rate Route Frequency Ordered Stop   03/16/22 1200  meropenem (MERREM) 500 mg in sodium chloride 0.9 % 100 mL IVPB        500 mg 200 mL/hr over 30 Minutes Intravenous Every 12 hours 03/16/22 1056     03/15/22 1400  piperacillin-tazobactam (ZOSYN) IVPB 2.25 g  Status:  Discontinued        2.25 g 100 mL/hr over 30 Minutes Intravenous Every 8 hours 03/15/22 0725 03/16/22 1051   03/15/22 1100  levofloxacin (LEVAQUIN) IVPB 750 mg        750 mg 100 mL/hr over 90 Minutes Intravenous  Once 03/15/22 1011 03/15/22 1249   03/14/22 1000  piperacillin-tazobactam (ZOSYN) IVPB 3.375 g  Status:  Discontinued        3.375 g 12.5 mL/hr over 240 Minutes Intravenous Every 8 hours 03/14/22 0825 03/15/22 0725   03/11/22 1000  oseltamivir (TAMIFLU) capsule 30 mg        30 mg Oral Daily 03/11/22 0905 03/12/22 0843   03/09/22 1400  cefTRIAXone (ROCEPHIN) 2 g in sodium chloride 0.9 % 100 mL IVPB        2 g 200 mL/hr over 30 Minutes Intravenous Every 24 hours 03/09/22 1011 03/11/22 1450   03/08/22 1000  azithromycin (ZITHROMAX) 500 mg in sodium chloride 0.9 % 250 mL IVPB  Status:  Discontinued        500 mg 250 mL/hr over 60 Minutes Intravenous Every 24 hours 03/08/2022 1719 03/09/22 1011   03/08/22 1000  oseltamivir (TAMIFLU) capsule 30 mg  Status:  Discontinued        30 mg Oral 2 times daily 02/25/2022 1836 03/08/22 0737   03/08/22 1000  oseltamivir (TAMIFLU) capsule 30 mg  Status:  Discontinued        30 mg Per Tube Daily 03/08/22 0737 03/11/22 0905   03/12/2022 2200  piperacillin-tazobactam (ZOSYN) IVPB 3.375 g  Status:  Discontinued        3.375 g 12.5  mL/hr over 240 Minutes Intravenous Every 8 hours 02/16/2022 1742 03/09/22 1011   02/26/2022 2200  oseltamivir (TAMIFLU) capsule 30 mg  Status:  Discontinued        30 mg Per Tube 2 times daily 02/20/2022 1747 03/02/2022 1833   02/20/2022 1900  oseltamivir (TAMIFLU) capsule 75 mg        75 mg Per Tube  Once 02/21/2022 1833 02/17/2022 2300   02/18/2022 1600  ceFEPIme (MAXIPIME) 2 g in sodium chloride 0.9 % 100 mL IVPB        2 g 200 mL/hr over 30 Minutes Intravenous  Once 03/10/2022 1548 02/24/2022 1709   02/14/2022 1600  vancomycin (VANCOCIN) IVPB 1000 mg/200 mL premix        1,000 mg 200 mL/hr over 60 Minutes Intravenous  Once 02/27/2022 1548 02/20/2022 2000   03/12/2022 1600  azithromycin (ZITHROMAX) 500 mg in sodium chloride 0.9 % 250 mL IVPB        500 mg 250 mL/hr over 60 Minutes Intravenous  Once 03/02/2022 1548 02/16/2022 1715        Significant Hospital Events: Including procedures, antibiotic start and stop dates in addition to other pertinent events  12/22: Presented to ED, required intubation in the ED.  PCCM asked to admit 12/23: remains intubated, comfortable  12/24: On minimal vent settings. Tolerating SBT ~ EXTUBATE to BiPAP. 03/12/22- patient has had intermittent episodes of desaturation requiring BIPAP and has increased thickened secretions.   03/13/22- patient intubated due to worsening hypoxemia and distress, plan for bronchoscopy today due to worsening secretions.  We called and spoke to daughter and wife to update family.  03/14/22- s/p BAL with GNR growing overnight, have narrowed abx to Zosyn. Procal trending up. He received 3L IVF with resultant dilutional CBC. He's on PRVC at 60FiO2, working on weaning vasopressors with levophed/vasopressin, CVP is trending. Cytology BAL from LUL in process. Family meeting today.  03/15/22- Patient with no overnight events, BAL with gram negative rods, cytology negative for atypia.  Worsening renal function and thrombocytopenia consistent with sepsis due to post  Influenza bacterial pneumonia. Have broadened coverage for double pseudomonas.  03/16/22- patient is DNR now and he is doing poorly.  Renal failure is worse , his family does not wish to initiate dialysis.  GOC was performed and comfort care plan is in order per family wishes.  03/17/22- patient showing no signs of improvement with progressively worsening multi organ failure in septic shock.  Family in process of transitioning to comfort care and patient is DNR. Pseudomonas from bronchoscopy is multi drug resistant.  April 10, 2022- Remains critically ill, continues to require levophed and vasopressin. Worsening AKI and oliguria.  Tentative plan to transition to COMFORT MEASURES today    Objective   Blood pressure 107/67, pulse 98, temperature 98.4 F (36.9 C), resp. rate 18, height _0  (1.753 m), weight 59.9 kg, SpO2 91 %. CVP:  [3 mmHg-10 mmHg] 10 mmHg  Vent Mode: PRVC FiO2 (%):  [50 %] 50 % Set Rate:  [18 bmp] 18 bmp Vt Set:  [500 mL] 500 mL PEEP:  [5 cmH20] 5 cmH20 Pressure Support:  [10 cmH20] 10 cmH20 Plateau Pressure:  [18 cmH20-21 cmH20] 21 cmH20   Intake/Output Summary (Last 24 hours) at Apr 10, 2022 6606 Last data filed at 04/10/2022 0045 Gross per 24 hour  Intake 1010.75 ml  Output 45 ml  Net 965.75 ml    Filed Weights   03/16/22 0500 03/17/22 0415 04/10/22 0424  Weight: 53.8 kg 59.4 kg 59.9 kg    Examination: General: Acute on chronically ill-appearing male, laying in bed, intubated, sedated on Fentanyl, no acute distress HENT: Atraumatic, normocephalic, neck supple, no JVD, orally intubated Lungs: Coarse breath sounds throughout, no wheezing noted, even, overbreathes the vent Cardiovascular: Regular rate and rhythm, S1-S2, no murmurs, rubs, gallops Abdomen: Soft, nontender, nondistended, no guarding rebound tenderness, bowel sounds positive x 4 Extremities: Normal bulk and tone, no deformities, no edema Neuro: Sedated, withdraws from pain, currently not following commands,  pupils PERRLA GU: Foley catheter draining yellow urine  Resolved Hospital Problem list     Assessment & Plan:   Acute on chronic Hypoxic & Hypercapnic Respiratory Failure in setting of AECOPD due to influenza A infection  LUL Cavitary Lesion ~ Pseudomonas Pneumonia -Full vent support, implement lung protective strategies -Plateau pressures less than 30 cm H20 -Wean FiO2 & PEEP as tolerated to maintain O2 sats 88 to 92% -Follow intermittent Chest X-ray & ABG as needed -Spontaneous Breathing Trials when respiratory parameters met and mental status permits -Implement VAP Bundle -Bronchodilators -Completed course of IV steroids -ABX as above  Septic Shock Mildly elevated troponin, suspect demand ischemia PMHx: HTN, HLD, CAD s/p stent Echocardiogram  03/03/2022: LVEF 55 to 56%, grade 2 diastolic dysfunction, RV systolic function normal -Continuous cardiac monitoring -Maintain MAP >65 -Vasopressors as needed to maintain MAP goal -Lactic acid has normalized -Trend HS Troponin until peaked (34 ~ 48 ~) -Diuresis as BP and renal function permits ~ holding due to AKI and pressors  Severe Sepsis in setting of Influenza A infection & Pseudomonas Pneumonia (Present on admission) -Monitor fever curve -Trend WBC's & Procalcitonin -Follow cultures as above -Continue empiric Meropenem pending cultures & sensitivities  Acute Kidney Injury ~ WORSENING -Monitor I&O's / urinary output -Follow BMP -Ensure adequate renal perfusion -Avoid nephrotoxic agents as able -Replace electrolytes as indicated -Nephrology consulted, however signed off as pt's family does not wish to pursue Renal Replacement Therapy  Anemia of chronic disease Thrombocytopenia, suspect due to sepsis -Monitor for S/Sx of bleeding -Trend CBC -Heparin subcu for VTE Prophylaxis  -Transfuse for Hgb <7 -Transfuse platelets for PLT count <50K with active bleeding  Diabetes Mellitus Type II -CBG's q4h; Target range of 140 to  180 -SSI -Follow ICU Hypo/Hyperglycemia protocol  Acute metabolic encephalopathy, suspect CO2 narcosis Sedation needs in the setting of mechanical ventilation PMHx: small left PCA territory stroke  -Treatment of hypercapnia and metabolic derangements as outlined above -Maintain a RASS goal of 0 to -1 -Propofol as needed to maintain RASS goal -Avoid sedating medications as able -Daily wake up assessment -Aspirin and Plavix    Patient is critically ill with multiple chronic co-morbidities, including severe COPD requiring 4L supplemental O2 at baseline.  Prognosis is extremely guarded, and long term prognosis is extremely poor.  Pt is DNR status, recommend consideration of transition to COMFORT MEASURES.  Palliative Care is following.   Best Practice (right click and "Reselect all SmartList Selections" daily)   Diet/type: NPO, tube feeds DVT prophylaxis: prophylactic heparin  GI prophylaxis: PPI Lines: N/A Foley:  Yes, and it is still needed Code Status:  full code Last date of multidisciplinary goals of care discussion [03/21/2022]  1/2: Will update pt's family when they arrive at bedside.   Labs   CBC: Recent Labs  Lab 03/14/22 0648 03/15/22 0337 03/16/22 0225 03/17/22 0433 03-21-22 0425  WBC 8.9 10.8* 11.5* 9.8 7.2  NEUTROABS 8.2*  --   --   --   --   HGB 7.6* 7.4* 7.5* 6.7* 6.4*  HCT 25.9* 24.5* 25.5* 22.5* 20.8*  MCV 104.4* 104.3* 104.1* 102.7* 100.0  PLT 143* 111* 145* 150 139*     Basic Metabolic Panel: Recent Labs  Lab 03/14/22 0648 03/15/22 0337 03/16/22 0225 03/17/22 0433 03/21/2022 0425  NA 145 142 144 140 141  K 3.6 3.4* 4.0 4.1 4.1  CL 107 104 105 99 102  CO2 _0 GLUCOSE 184* 285* 147* 234* 143*  BUN 65* 73* 89* 92* 116*  CREATININE 2.34* 2.90* 3.64* 4.92* 5.50*  CALCIUM 8.1* 8.0* 8.0* 8.0* 8.3*  MG 2.0 2.0 2.2 2.3 2.4  PHOS 3.9 3.7  3.6 4.7*  4.6 5.6* 6.3*    GFR: Estimated Creatinine Clearance: 9.2 mL/min (A) (by C-G formula  based on SCr of 5.5 mg/dL (H)). Recent Labs  Lab 03/12/22 0432 03/13/22 0433 03/13/22 1559 03/14/22 0648 03/15/22 0337 03/16/22 0225 03/17/22 0433 03-21-22 0425  PROCALCITON 0.40  --  3.79  --  19.64 20.65  --   --   WBC  --    < >  --    < > 10.8* 11.5* 9.8 7.2  LATICACIDVEN  --   --  1.5  --   --   --   --   --    < > = values in this interval not displayed.     Liver Function Tests: Recent Labs  Lab 03/15/22 0337 03/16/22 0225 03/17/22 0433 04/07/22 0425  ALBUMIN 2.5* 2.4* 2.0* 2.0*    No results for input(s): "LIPASE", "AMYLASE" in the last 168 hours. No results for input(s): "AMMONIA" in the last 168 hours.  ABG    Component Value Date/Time   PHART 7.34 (L) 03/14/2022 0431   PCO2ART 69 (HH) 03/14/2022 0431   PO2ART 164 (H) 03/14/2022 0431   HCO3 37.2 (H) 03/14/2022 0431   O2SAT 100 03/14/2022 0431     Coagulation Profile: No results for input(s): "INR", "PROTIME" in the last 168 hours.   Cardiac Enzymes: No results for input(s): "CKTOTAL", "CKMB", "CKMBINDEX", "TROPONINI" in the last 168 hours.  HbA1C: Hemoglobin A1C  Date/Time Value Ref Range Status  07/07/2017 12:00 AM 8.2  Final  06/18/2016 03:18 PM 7.9  Final   Hgb A1c MFr Bld  Date/Time Value Ref Range Status  03/08/2022 04:45 AM 8.9 (H) 4.8 - 5.6 % Final    Comment:    (NOTE)         Prediabetes: 5.7 - 6.4         Diabetes: >6.4         Glycemic control for adults with diabetes: <7.0   03/01/2022 06:45 AM 9.0 (H) 4.8 - 5.6 % Final    Comment:    (NOTE)         Prediabetes: 5.7 - 6.4         Diabetes: >6.4         Glycemic control for adults with diabetes: <7.0     CBG: Recent Labs  Lab 03/17/22 1534 03/17/22 1916 03/17/22 2328 04-07-2022 0401 04-07-22 0759  GLUCAP 165* 125* 171* 147* 115*     Review of Systems:   Unable to assess due to intubation/sedation/critical illness   Past Medical History:  He,  has a past medical history of Anemia, Basal cell carcinoma, BPH  (benign prostatic hyperplasia), CAD (coronary artery disease), COPD (chronic obstructive pulmonary disease) ([place]), GERD (gastroesophageal reflux disease), Macular degeneration, Pure hypercholesterolemia, Stroke ([place]), Tobacco abuse, Traumatic amputation of leg(s) (complete) (partial), unilateral, level not specified, without mention of complication (06/77/0340), and Unspecified essential hypertension.   Surgical History:   Past Surgical History:  Procedure Laterality Date   AMPUTATION Right 08/26/2011   Below Knee Amputee   APPENDECTOMY     CARDIAC CATHETERIZATION     CAROTID ANGIOGRAPHY Right 05/14/2016   Procedure: Carotid Angiography;  Surgeon: Katha Cabal, MD;  Location: [facility];  Service: Cardiovascular;  Laterality: Right;   CAROTID PTA/STENT INTERVENTION  05/14/2016   Procedure: Carotid PTA/Stent Intervention;  Surgeon: Katha Cabal, MD;  Location: [facility];  Service: Cardiovascular;;   CORONARY ANGIOPLASTY WITH STENT PLACEMENT  09/28/2006   RCA   CORONARY ANGIOPLASTY WITH STENT PLACEMENT  07/21/2006   Mid LAD   CORONARY ANGIOPLASTY WITH STENT PLACEMENT  06/06/2013   30% prox LAD at the site of a prior stent, 40% mid LAD at the origin of D1, 40% ostial D1, 30% prox LCx at the site of a prior stent, in the proximal third of the vessel segment, 20% prox RCA, 99% distal RCA s/p DES. EF >55%.     Social History:   reports that he has been smoking cigarettes. He has  a 60.00 pack-year smoking history. He has never used smokeless tobacco. He reports that he does not drink alcohol and does not use drugs.   Family History:  His family history includes Mental illness in his mother.   Allergies No Known Allergies   Home Medications  Prior to Admission medications   Medication Sig Start Date End Date Taking? Authorizing Provider  albuterol (VENTOLIN HFA) 108 (90 Base) MCG/ACT inhaler Inhale 2 puffs into the lungs every 6 (six) hours as needed for wheezing or  shortness of breath. 03/03/22 06/01/22  Fritzi Mandes, MD  amLODipine (NORVASC) 10 MG tablet TAKE ONE-HALF TABLET BY MOUTH EVERY DAY FOR BLOOD PRESSURE 11/26/21   [provider]  amoxicillin-clavulanate (AUGMENTIN) 875-125 MG tablet Take 1 tablet by mouth every 12 (twelve) hours for 6 days. 03/03/22 03/09/22  Fritzi Mandes, MD  aspirin EC 81 MG tablet Take 1 tablet (81 mg total) by mouth daily. 12/01/18   Minna Merritts, MD  atorvastatin (LIPITOR) 40 MG tablet TAKE 1 TABLET BY MOUTH EVERY DAY 10/06/18   Karamalegos, Devonne Doughty, DO  azithromycin (ZITHROMAX) 500 MG tablet Take 1 tablet (500 mg total) by mouth daily for 3 days. 03/04/22 03/03/2022  Fritzi Mandes, MD  Budeson-Glycopyrrol-Formoterol (BREZTRI AEROSPHERE) 160-9-4.8 MCG/ACT AERO Inhale 2 puffs into the lungs in the morning and at bedtime. 04/08/21   Tyler Pita, MD  Cholecalciferol 50 MCG (2000 UT) TABS Take 1 tablet by mouth daily. 08/15/21   [provider]  clopidogrel (PLAVIX) 75 MG tablet Take 1 tablet (75 mg total) by mouth daily for 21 days. 03/04/22 03/25/22  Fritzi Mandes, MD  furosemide (LASIX) 20 MG tablet Take 1 tablet (20 mg total) by mouth daily as needed. 07/30/21   Minna Merritts, MD  ipratropium (ATROVENT) 0.06 % nasal spray Place 2 sprays into both nostrils 4 (four) times daily. 04/08/21   Tyler Pita, MD  ipratropium-albuterol (DUONEB) 0.5-2.5 (3) MG/3ML SOLN USE 1 NEBULIZER SOLUTION (3ML) BY ORAL INHALATION EVERY 6 HOURS AS NEEDED FOR SHORTNESS OF BREATH 09/23/21   [provider]  ketoconazole (NIZORAL) 2 % cream APPLY SUFFICIENT AMOUNT TOPICALLY TWO TIMES A DAY TO RASH ON FOOT. USE FOR 2 WEEKS, THEN DISCONTINUE 11/26/21   [provider]  lisinopril (ZESTRIL) 40 MG tablet TAKE ONE-HALF TABLET BY MOUTH EVERY DAY FOR BLOOD PRESSURE 05/14/21   [provider]  metFORMIN (GLUCOPHAGE) 500 MG tablet Take 1,000 mg by mouth 2 (two) times daily with a meal.    [provider]   metoprolol tartrate (LOPRESSOR) 50 MG tablet TAKE 1 TABLET BY MOUTH TWICE A DAY 10/30/21   Gollan, Kathlene November, MD  nitroGLYCERIN (NITROSTAT) 0.4 MG SL tablet Place 1 tablet (0.4 mg total) under the tongue every 5 (five) minutes as needed for chest pain. 10/13/17   Minna Merritts, MD  predniSONE (DELTASONE) 5 MG tablet Take 1 tablet by mouth daily. 11/26/21   [provider]  senna-docusate (SENOKOT-S) 8.6-50 MG tablet Take 1 tablet by mouth 2 (two) times daily. 03/03/22   Fritzi Mandes, MD  tamsulosin (FLOMAX) 0.4 MG CAPS capsule TAKE ONE CAPSULE BY MOUTH EVERY EVENING TO REPLACE TERAZOSIN 11/15/21   [provider]     Critical care provider statement:   Total critical care time: 40 minutes    Darel Hong, AGACNP-BC [facility] epic messenger for cross cover needs If after hours, please call E-link

## 2022-04-17 NOTE — Progress Notes (Signed)
   30-Mar-2022 1400  Clinical Encounter Type  Visited With Patient and family together  Visit Type Initial  Referral From Nurse  Consult/Referral To Chaplain   Chaplain responded to nurse page. Chaplain met family in ICU waiting room and was briefed on situation with patient moving to comfort care. Chaplain brought family into patient room and provided compassionate presence and reflective listening as family shared about patient's health challenges and EOL decisions. Chaplain provided prayer as requested. Chaplain services are available for follow up as needed.

## 2022-04-17 NOTE — Consult Note (Signed)
Consultation Note Date: 24-Mar-2022   Patient Name: Stanley Mclean  DOB: [birth year]  MRN: [id number]  Age / Sex: 82 y.o., male  PCP: Olen Pel, NP Referring Physician: Bronson Ing, MD  Reason for Consultation: Establishing goals of care and Withdrawal of life-sustaining treatment  HPI/Patient Profile: 82 y.o. male  with past medical history of HTN/HLD, COPD chronic 4 L, BPH, NIDDM, CAD status post coronary artery stent, right BKA with phantom limb pain admitted on 02/28/2022 with acute on chronic hypoxic and hypercapnic respiratory failure.   Clinical Assessment and Goals of Care: I have reviewed medical records including EPIC notes, labs and imaging, received report from RN, assessed the patient.  Mr. Penza is lying quietly in bed intubated/ventilated and sedated.  Initially, there is no family present.    I return later in the morning to meet at bedside with wife [name] and daughter Neoma Laming, to discuss diagnosis prognosis, [place], EOL wishes, disposition and options.  I introduced Palliative Medicine as specialized medical care for people living with serious illness. It focuses on providing relief from the symptoms and stress of a serious illness. The goal is to improve quality of life for both the patient and the family.  We focused on their current illness.  Family shares that they are requesting compassionate extubation.  They share that Mr. Faivre is not showing meaningful recovery.  We talked about compassionate extubation, what this would look like and feel like.  Family shares that they are not waiting on any other family members. The natural disease trajectory and expectations at EOL were discussed.  Advanced directives, concepts specific to code status, artifical feeding and hydration, and rehospitalization were considered and discussed.  DNR.  Discussed the importance of continued conversation with  family and the medical providers regarding overall plan of care and treatment options, ensuring decisions are within the context of the patient's values and GOCs.  Questions and concerns were addressed.  The family was encouraged to call with questions or concerns.  PMT will continue to support holistically.  Conference with attending, bedside nursing staff, related to patient condition, needs, goals of care, symptom management.    HCPOA   NEXT OF KIN -wife [name] with daughter Neoma Laming    SUMMARY OF RECOMMENDATIONS   Compassionate extubation  Anticipate in-hospital death   Code Status/Advance Care Planning: DNR  Symptom Management:  End of life order set implemented.  Fentanyl for EOL care 2/2 renal failure  Palliative Prophylaxis:  Frequent Pain Assessment and Turn Reposition  Additional Recommendations (Limitations, Scope, Preferences): Full Comfort Care  Psycho-social/Spiritual:  Desire for further Chaplaincy support:yes Additional Recommendations: Caregiving  Support/Resources and Grief/Bereavement Support  Prognosis:  Hours - Days, anticipate hours   Discharge Planning: Anticipated Hospital Death      Primary Diagnoses: Present on Admission:  Acute on chronic respiratory failure with hypoxia and hypercarbia (HCC)  COPD exacerbation (HCC)  Pneumonia of both lungs due to infectious organism  Influenzal pneumonia  Acute metabolic encephalopathy  Underweight  AKI (acute kidney injury) (  [place])  Hypernatremia   I have reviewed the medical record, interviewed the patient and family, and examined the patient. The following aspects are pertinent.  Past Medical History:  Diagnosis Date   Anemia    Basal cell carcinoma    BPH (benign prostatic hyperplasia)    CAD (coronary artery disease)    a. s/p multiple prior PCIs b. s/p DES-distal RCA 05/2013   COPD (chronic obstructive pulmonary disease) (HCC)    GERD (gastroesophageal reflux disease)    Macular degeneration     Pure hypercholesterolemia    Stroke (HCC)    Tobacco abuse    Traumatic amputation of leg(s) (complete) (partial), unilateral, level not specified, without mention of complication 69/67/8938   Right Below Knee Amputation   Unspecified essential hypertension    Social History   Socioeconomic History   Marital status: Married    Spouse name: Eban Weick   Number of children: Not on file   Years of education: Not on file   Highest education level: Not on file  Occupational History   Occupation: retired   Tobacco Use   Smoking status: Every Day    Packs/day: 1.00    Years: 60.00    Total pack years: 60.00    Types: Cigarettes    Last attempt to quit: 03/29/2016    Years since quitting: 5.9   Smokeless tobacco: Never   Tobacco comments:    1 pack will last 1 week--03/05/21  Vaping Use   Vaping Use: Never used  Substance and Sexual Activity   Alcohol use: No   Drug use: No   Sexual activity: Never  Other Topics Concern   Not on file  Social History Narrative   Not on file   Social Determinants of Health   Financial Resource Strain: Low Risk  (07/01/2021)   Overall Financial Resource Strain (CARDIA)    Difficulty of Paying Living Expenses: Not hard at all  Food Insecurity: No Food Insecurity (07/01/2021)   Hunger Vital Sign    Worried About Running Out of Food in the Last Year: Never true    Ran Out of Food in the Last Year: Never true  Transportation Needs: No Transportation Needs (07/01/2021)   PRAPARE - Hydrologist (Medical): No    Lack of Transportation (Non-Medical): No  Physical Activity: Inactive (07/01/2021)   Exercise Vital Sign    Days of Exercise per Week: 0 days    Minutes of Exercise per Session: 0 min  Stress: No Stress Concern Present (07/01/2021)   [place]    Feeling of Stress : Not at all  Social Connections: [place] (07/01/2021)   Social  Connection and Isolation Panel [NHANES]    Frequency of Communication with Friends and Family: Twice a week    Frequency of Social Gatherings with Friends and Family: Twice a week    Attends Religious Services: More than 4 times per year    Active Member of [employer] or Organizations: Yes    Attends Archivist Meetings: Never    Marital Status: Married   Family History  Problem Relation Age of Onset   Mental illness Mother    Scheduled Meds:  amiodarone  200 mg Per Tube BID   aspirin  81 mg Per Tube Daily   atorvastatin  40 mg Per Tube Daily   Chlorhexidine Gluconate Cloth  6 each Topical Daily   clopidogrel  75 mg Per Tube  Daily   docusate  100 mg Per Tube BID   free water  100 mL Per Tube Q4H   heparin  5,000 Units Subcutaneous Q8H   insulin aspart  0-15 Units Subcutaneous Q4H   ipratropium-albuterol  3 mL Nebulization Q6H   mouth rinse  15 mL Mouth Rinse Q2H   pantoprazole (PROTONIX) IV  40 mg Intravenous Q12H   polyethylene glycol  17 g Per Tube Daily   Continuous Infusions:  sodium chloride 10 mL/hr at 03-27-2022 1200   sodium chloride Stopped (03/13/22 0916)   feeding supplement (VITAL AF 1.2 CAL) Stopped (03/17/22 0205)   fentaNYL infusion INTRAVENOUS 200 mcg/hr (March 27, 2022 1202)   meropenem (MERREM) IV     norepinephrine (LEVOPHED) Adult infusion 6 mcg/min (27-Mar-2022 1200)   vasopressin 0.02 Units/min (2022/03/27 1200)   PRN Meds:.sodium chloride, acetaminophen, docusate, fentaNYL, metoprolol tartrate, mouth rinse, polyethylene glycol Medications Prior to Admission:  Prior to Admission medications   Medication Sig Start Date End Date Taking? Authorizing Provider  amLODipine (NORVASC) 10 MG tablet Take 5 mg by mouth daily. 11/26/21  Yes [provider]  atorvastatin (LIPITOR) 40 MG tablet TAKE 1 TABLET BY MOUTH EVERY DAY 10/06/18  Yes Karamalegos, Devonne Doughty, DO  Budeson-Glycopyrrol-Formoterol (BREZTRI AEROSPHERE) 160-9-4.8 MCG/ACT AERO Inhale 2 puffs into the  lungs in the morning and at bedtime. 04/08/21  Yes Tyler Pita, MD  Cholecalciferol 50 MCG (2000 UT) TABS Take 1 tablet by mouth daily. 08/15/21  Yes [provider]  clopidogrel (PLAVIX) 75 MG tablet Take 1 tablet (75 mg total) by mouth daily for 21 days. 03/04/22 03/25/22 Yes Fritzi Mandes, MD  empagliflozin (JARDIANCE) 25 MG TABS tablet Take 15-25 mg by mouth daily. 02/14/22  Yes [provider]  lisinopril (ZESTRIL) 40 MG tablet Take 20 mg by mouth daily. 05/14/21  Yes [provider]  metFORMIN (GLUCOPHAGE) 500 MG tablet Take 1,000 mg by mouth 2 (two) times daily with a meal.   Yes [provider]  metoprolol tartrate (LOPRESSOR) 50 MG tablet TAKE 1 TABLET BY MOUTH TWICE A DAY 10/30/21  Yes Gollan, Kathlene November, MD  predniSONE (DELTASONE) 5 MG tablet Take 1 tablet by mouth daily. 11/26/21  Yes [provider]  senna-docusate (SENOKOT-S) 8.6-50 MG tablet Take 1 tablet by mouth 2 (two) times daily. 03/03/22  Yes Fritzi Mandes, MD  sulfamethoxazole-trimethoprim (BACTRIM) 400-80 MG tablet Take 1 tablet by mouth 2 (two) times daily. 03/06/22 04/14/22 Yes [provider]  tamsulosin (FLOMAX) 0.4 MG CAPS capsule Take 0.4 mg by mouth daily. 11/15/21  Yes [provider]  triamcinolone cream (KENALOG) 0.1 % Apply 1 Application topically 2 (two) times daily as needed (Inflammation on scalp). 09/23/21  Yes [provider]  albuterol (VENTOLIN HFA) 108 (90 Base) MCG/ACT inhaler Inhale 2 puffs into the lungs every 6 (six) hours as needed for wheezing or shortness of breath. 03/03/22 06/01/22  Fritzi Mandes, MD  aspirin EC 81 MG tablet Take 1 tablet (81 mg total) by mouth daily. 12/01/18   Minna Merritts, MD  furosemide (LASIX) 20 MG tablet Take 1 tablet (20 mg total) by mouth daily as needed. 07/30/21   Minna Merritts, MD  ipratropium (ATROVENT) 0.06 % nasal spray Place 2 sprays into both nostrils 4 (four) times daily. 04/08/21   Tyler Pita,  MD  ipratropium-albuterol (DUONEB) 0.5-2.5 (3) MG/3ML SOLN USE 1 NEBULIZER SOLUTION (3ML) BY ORAL INHALATION EVERY 6 HOURS AS NEEDED FOR SHORTNESS OF BREATH 09/23/21   [provider]  nitroGLYCERIN (NITROSTAT) 0.4 MG SL tablet Place 1 tablet (0.4 mg total) under the tongue every 5 (five) minutes as needed for chest pain. 10/13/17   Minna Merritts, MD   No Known Allergies Review of Systems  Unable to perform ROS: Intubated    Physical Exam Vitals and nursing note reviewed.  Constitutional:      Appearance: He is ill-appearing.  Pulmonary:     Comments: Intubated     Vital Signs: BP 101/63   Pulse (!) 125   Temp (!) 97.3 F (36.3 C)   Resp 16   Ht _0  (1.753 m)   Wt 59.9 kg   SpO2 90%   BMI 19.50 kg/m  Pain Scale: CPOT   Pain Score: 0-No pain   SpO2: SpO2: 90 % O2 Device:SpO2: 90 % O2 Flow Rate: .O2 Flow Rate (L/min): 15 L/min  IO: Intake/output summary:  Intake/Output Summary (Last 24 hours) at 2022/04/10 1348 Last data filed at April 10, 2022 1200 Gross per 24 hour  Intake 1045.2 ml  Output 45 ml  Net 1000.2 ml    LBM: Last BM Date : 03/16/22 Baseline Weight: Weight: 52.8 kg Most recent weight: Weight: 59.9 kg     Palliative Assessment/Data:     Time In: 1000 Time Out: 1115 Time Total: 75 minutes  Greater than 50%  of this time was spent counseling and coordinating care related to the above assessment and plan.  Signed by: Drue Novel, NP   Please contact Palliative Medicine Team phone at [phone] for questions and concerns.  For individual provider: See Shea Evans

## 2022-04-17 NOTE — Progress Notes (Signed)
Extubated to room air per comfort care order.

## 2022-04-17 NOTE — Death Summary Note (Signed)
DEATH SUMMARY   Patient Details  Name: Stanley Mclean MRN: [id number] DOB: [birth year]  Admission/Discharge Information   Admit Date:  03-31-22  Date of Death:  April 11, 2021  Time of Death:  14:44  Length of Stay: 11  Referring Physician: Olen Pel, NP   Reason(s) for Hospitalization  Acute on Chronic Hypoxic & Hypercapnic Respiratory Failure Acute COPD Exacerbation Influenza A Infection Pseudomonas Pneumonia Left Upper Lobe Cavitary Lesion Severe Sepsis with Septic Shock Elevated Troponin Acute Kidney Injury Anemia of Chronic Disease Thrombocytopenia Diabetes Mellitus Type II Acute metabolic encephalopathy/CO2 Narcosis  Diagnoses  Preliminary cause of death:  Acute on Chronic Hypoxic & Hypercapnic Respiratory Failure Secondary Diagnoses (including complications and co-morbidities):  Principal Problem:   Acute on chronic respiratory failure with hypoxia and hypercarbia (HCC) Active Problems:   Diabetes mellitus type 2, noninsulin dependent (HCC)   COPD exacerbation (HCC)   Pneumonia of both lungs due to infectious organism   Acute metabolic encephalopathy   Influenzal pneumonia   Underweight   Cavitating mass in left upper lung lobe   Acute on chronic diastolic CHF (congestive heart failure) (HCC)   AKI (acute kidney injury) ([place])   Hypernatremia   Protein-calorie malnutrition, severe   Brief Hospital Course (including significant findings, care, treatment, and services provided and events leading to death)  Stanley Mclean is a 82 year old male with a past medical history significant for COPD requiring 4 L supplemental oxygen at baseline, CAD, hypertension, diabetes mellitus, stroke who presents to [facility] ED on 03/31/2022 due to shortness of breath.  Patient currently is intubated and currently no family is available to contribute to history, therefore history obtained from chart review.   Per ED and nursing notes, the patient's family contacted EMS due to  shortness of breath.  Upon EMS arrival he was noted to be hypoxic with O2 saturations of 82% on his baseline 4 L nasal cannula.  EMS noted severe wheezing with poor air movement of which she was given 125 mg Solu-Medrol, 4 g of magnesium, and 3 DuoNeb's.   Upon arrival to the ED he continued to have respiratory distress, only able to talk in single words.     ED Course: Initial vital signs: Temperature 98, respiratory rate 23, pulse 125, blood pressure 96/65 Significant Labs: Bicarb 34, glucose 188, BUN 22, creatinine 1.24, AST 45, high-sensitivity troponin 34, lactic acid 1.8, procalcitonin 0.11, WBC 10.6, hemoglobin 11.3, hematocrit 36.8 Positive for influenza A Urinalysis negative for UTI Imaging Chest X-ray>>FINDINGS: The heart size and mediastinal contours are within normal limits. Nasogastric tube is seen in right mainstem bronchus. Endotracheal tube is in good position. Bilateral lung opacities are again noted concerning for multifocal pneumonia. The visualized skeletal structures are unremarkable CT Chest w/o Contrast>>IMPRESSION: 1. Extensive bilateral heterogeneous ground-glass and consolidative airspace opacity throughout the lungs is slightly worsened compared to prior examination. Findings are consistent with worsened multifocal infection. 2. No significant change in a serpiginous, thick-walled cavitary lesion about the peripheral left lung, the largest axial component at the posterolateral left upper lobe measuring 4.4 x 2.5 cm. 3. Endotracheal intubation. 4. Severe emphysema and diffuse bilateral bronchial wall thickening. 5. Frothy debris in the lower trachea and left mainstem bronchus. 6. Coronary artery disease. 7. Nonobstructive right nephrolithiasis. Medications Administered: 1 L normal saline bolus, IV cefepime, vancomycin, azithromycin   While in the ED his mental status worsened which he became minimally responsive and somnolent which he was subsequently  intubated by the ED provider.   PCCM is asked  to admit the patient for further workup and treatment.   Please see "significant hospital events" for full detailed hospital course.  Significant Hospital Events: Including procedures, antibiotic start and stop dates in addition to other pertinent events   12/22: Presented to ED, required intubation in the ED.  PCCM asked to admit 12/23: remains intubated, comfortable  12/24: On minimal vent settings. Tolerating SBT ~ EXTUBATE to BiPAP. 03/12/22- patient has had intermittent episodes of desaturation requiring BIPAP and has increased thickened secretions.   03/13/22- patient intubated due to worsening hypoxemia and distress, plan for bronchoscopy today due to worsening secretions.  We called and spoke to daughter and wife to update family.  03/14/22- s/p BAL with GNR growing overnight, have narrowed abx to Zosyn. Procal trending up. He received 3L IVF with resultant dilutional CBC. He's on PRVC at 60FiO2, working on weaning vasopressors with levophed/vasopressin, CVP is trending. Cytology BAL from [facility] in process. Family meeting today.  03/15/22- Patient with no overnight events, BAL with gram negative rods, cytology negative for atypia.  Worsening renal function and thrombocytopenia consistent with sepsis due to post Influenza bacterial pneumonia. Have broadened coverage for double pseudomonas.  03/16/22- patient is DNR now and he is doing poorly.  Renal failure is worse , his family does not wish to initiate dialysis.  GOC was performed and comfort care plan is in order per family wishes.  03/17/22- patient showing no signs of improvement with progressively worsening multi organ failure in septic shock.  Family in process of transitioning to comfort care and patient is DNR. Pseudomonas from bronchoscopy is multi drug resistant.  03-21-2022- Remains critically ill, continues to require levophed and vasopressin. Worsening AKI and oliguria.  Transitioned to  [place] today   Pertinent Labs and Studies  Significant Diagnostic Studies DG Chest Port 1 View  Result Date: 03/15/2022 CLINICAL DATA:  Acute respiratory failure with hypoxia. EXAM: PORTABLE CHEST 1 VIEW COMPARISON:  03/13/2022 FINDINGS: LEFT IJ central line tip overlies superior vena cava. Endotracheal tube tip is 6 centimeters above the carina. Enteric tube tip is off the image, below the gastroesophageal junction. There are patchy and coarse infiltrates throughout the lungs bilaterally, LEFT greater than RIGHT and stable in appearance. Bilateral pleural thickening or small effusions appear stable. IMPRESSION: 1. Stable diffuse pulmonary infiltrates. 2. Stable support apparatus. Electronically Signed   By: Nolon Nations M.D.   On: 03/15/2022 08:28   DG Chest Port 1 View  Result Date: 03/13/2022 CLINICAL DATA:  Central line placement. EXAM: PORTABLE CHEST 1 VIEW COMPARISON:  AP chest 03/13/2022 at 02 a.m. FINDINGS: New left internal jugular central venous catheter tip overlies the mid to superior aspect of the superior vena cava. Enteric tube again descends below the diaphragm with the tip excluded by collimation. Endotracheal tube tip terminates approximately 5 cm above the carina, likely mildly advanced from prior. Cardiac silhouette and mediastinal contours are within normal limits. There are again interstitial and heterogeneous opacities throughout the left lung and within the right lower lung, similar to prior. Left costophrenic angle is excluded. No large pleural effusion is seen. No pneumothorax. Mild-to-moderate multilevel degenerative disc changes of the thoracic spine. IMPRESSION: 1. New left internal jugular central venous catheter tip overlies the mid to superior aspect of the superior vena cava. No pneumothorax. 2. Unchanged interstitial and heterogeneous opacities throughout the left lung and within the right lower lung. Electronically Signed   By: Yvonne Kendall M.D.   On:  03/13/2022 15:27   DG Abd  1 View  Result Date: 03/13/2022 CLINICAL DATA:  Encounter for orogastric tube placement EXAM: ABDOMEN - 1 VIEW COMPARISON:  03/13/2022 FINDINGS: NG tube is been advanced slightly with the tip in the body of the stomach. Side hole within the body of the stomach. Normal bowel gas pattern. Diffuse bibasilar airspace disease. IMPRESSION: NGT advanced slightly with the tip in the body of the stomach. Electronically Signed   By: Franchot Gallo M.D.   On: 03/13/2022 10:51   DG Abd 1 View  Result Date: 03/13/2022 CLINICAL DATA:  Enteric tube placement. EXAM: ABDOMEN - 1 VIEW COMPARISON:  Abdominal x-ray dated March 07, 2022. FINDINGS: Enteric tube in the stomach with the side port at the gastroesophageal junction. The visualized bowel gas pattern is unremarkable. Unchanged right renal calculi. Increased reticulonodular opacities at the left-greater-than-right lung bases. IMPRESSION: 1. Enteric tube in the stomach with the side port at the gastroesophageal junction. Recommend advancing 3-4 cm. Electronically Signed   By: Titus Dubin M.D.   On: 03/13/2022 09:27   DG Chest Port 1 View  Result Date: 03/13/2022 CLINICAL DATA:  Intubation. EXAM: PORTABLE CHEST 1 VIEW COMPARISON:  CT chest from yesterday. FINDINGS: Interval intubation with the endotracheal tube tip 7.8 cm above the carina. New enteric tube with the tip below the field of view. The heart size and mediastinal contours are within normal limits. The lungs remain hyperinflated with emphysematous changes and coarse interstitial markings. Worsening reticulonodular opacities throughout the left lung and at the right lung base. No pleural effusion or pneumothorax. No acute osseous abnormality. IMPRESSION: 1. Interval intubation with endotracheal tube tip 7.8 cm above the carina. Consider advancing 3 cm. 2. Worsening multifocal pneumonia. Electronically Signed   By: Titus Dubin M.D.   On: 03/13/2022 09:25   CT Angio  Chest Pulmonary Embolism (PE) W or WO Contrast  Result Date: 03/12/2022 CLINICAL DATA:  Elevated D-dimer.  Pulmonary embolism suspected. EXAM: CT ANGIOGRAPHY CHEST WITH CONTRAST TECHNIQUE: Multidetector CT imaging of the chest was performed using the standard protocol during bolus administration of intravenous contrast. Multiplanar CT image reconstructions and MIPs were obtained to evaluate the vascular anatomy. RADIATION DOSE REDUCTION: This exam was performed according to the departmental dose-optimization program which includes automated exposure control, adjustment of the mA and/or kV according to patient size and/or use of iterative reconstruction technique. CONTRAST:  70m OMNIPAQUE IOHEXOL 350 MG/ML SOLN COMPARISON:  CT 03/06/2022 FINDINGS: Cardiovascular: Satisfactory opacification of the pulmonary arteries to the segmental level. No evidence of pulmonary embolism. Cardiomegaly. No pericardial effusion. Coronary artery and aortic atherosclerotic calcification. Mediastinum/Nodes: Stable prominence of mediastinal and hilar lymph nodes. Unremarkable esophagus. Frothy secretions are present within the trachea and throughout the bilateral mainstem and lobar and segmental bronchi. Lungs/Pleura: Advanced emphysema. Bilateral areas of ground-glass opacity, interstitial thickening, bronchial wall thickening and peripheral centrilobular micro nodularity. Unchanged serpiginous thick-walled cavity in the peripheral left lung the largest component of which measures 3.7 x 2.1 cm. No pleural effusion or pneumothorax. Upper Abdomen: No acute abnormality. Cholelithiasis. Nonobstructing right renal stone. Musculoskeletal: No chest wall abnormality. No acute osseous findings. Review of the MIP images confirms the above findings. IMPRESSION: 1. Negative for acute pulmonary embolism. 2. Pulmonary parenchymal findings compatible with multifocal bronchopneumonia are similar to 02/21/2022. 3. No significant change in serpiginous  thick walled cavitary lesion about the peripheral left lung. 4. Frothy debris in the trachea and throughout the bilateral mainstem and segmental bronchi. Aortic Atherosclerosis (ICD10-I70.0) and Emphysema (ICD10-J43.9). Electronically Signed   By: TDorothea Ogle  Stutzman M.D.   On: 03/12/2022 02:12   DG Chest Port 1 View  Result Date: 03/11/2022 CLINICAL DATA:  Dyspnea EXAM: PORTABLE CHEST 1 VIEW COMPARISON:  02/27/2022 FINDINGS: Endotracheal tube and malposition nasogastric tube have been removed. The lungs remain hyperinflated suggesting changes of underlying COPD. Superimposed diffuse reticular infiltrates are again identified, more focal within the left upper lobe and right lung base, likely representing pneumonic infiltrate superimposed upon underlying interstitial lung disease better assessed on CT examination of 03/11/2022. These appear progressive since prior examination. No pneumothorax or pleural effusion. Cardiac size within normal limits. No acute bone abnormality. IMPRESSION: 1. Interval extubation. 2. Stable pulmonary hyperinflation suggesting changes of COPD. 3. Progressive pneumonic infiltrate superimposed upon underlying interstitial lung disease. Electronically Signed   By: Fidela Salisbury M.D.   On: 03/11/2022 20:47   DG Abd 1 View  Result Date: 03/04/2022 CLINICAL DATA:  Check gastric catheter placement EXAM: ABDOMEN - 1 VIEW COMPARISON:  Film from earlier in the same day. FINDINGS: Gastric catheter is been further advanced into the stomach. No other focal abnormality is noted. IMPRESSION: Gastric catheter further advanced into the stomach. Electronically Signed   By: Inez Catalina M.D.   On: 03/06/2022 22:36   DG Abd 1 View  Result Date: 02/22/2022 CLINICAL DATA:  Check gastric catheter placement EXAM: ABDOMEN - 1 VIEW COMPARISON:  Film from earlier in the same day. FINDINGS: Gastric catheter has been advanced slightly deeper into the stomach with the proximal side port just beyond the  gastroesophageal junction. No obstructive changes are seen. IMPRESSION: Gastric catheter as described. Electronically Signed   By: Inez Catalina M.D.   On: 03/16/2022 21:41   DG Abd 1 View  Result Date: 02/26/2022 CLINICAL DATA:  Check gastric catheter placement EXAM: ABDOMEN - 1 VIEW COMPARISON:  None Available. FINDINGS: Gastric catheter extends into the stomach. Proximal side port however lies in the distal esophagus. This should be advanced several cm deeper into the stomach. No free air is noted. Right renal calculus is again noted. IMPRESSION: Gastric catheter as described. This should be advanced deeper into the stomach. Electronically Signed   By: Inez Catalina M.D.   On: 02/16/2022 20:30   CT CHEST WO CONTRAST  Result Date: 03/11/2022 CLINICAL DATA:  Pneumonia, failing outpatient treatment EXAM: CT CHEST WITHOUT CONTRAST TECHNIQUE: Multidetector CT imaging of the chest was performed following the standard protocol without IV contrast. RADIATION DOSE REDUCTION: This exam was performed according to the departmental dose-optimization program which includes automated exposure control, adjustment of the mA and/or kV according to patient size and/or use of iterative reconstruction technique. COMPARISON:  CT chest abdomen pelvis, 03/02/2022 FINDINGS: Cardiovascular: Aortic atherosclerosis. Normal heart size. Extensive three-vessel coronary artery calcifications. No pericardial effusion. Mediastinum/Nodes: Prominent mediastinal and bilateral hilar lymph nodes, unchanged. Thyroid gland, trachea, and esophagus demonstrate no significant findings. Lungs/Pleura: Endotracheal intubation, tube tip in the midtrachea. Frothy debris in the lower trachea and left mainstem bronchus. Severe emphysema. Diffuse bilateral bronchial wall thickening. Extensive bilateral heterogeneous ground-glass and consolidative airspace opacity throughout the lungs is slightly worsened compared to prior examination. No significant change  in a serpiginous, thick-walled cavitary lesion about the peripheral left lung, the largest axial component at the posterolateral left upper lobe measuring 4.4 x 2.5 cm (series 3, image 34). Unchanged small nodule of the superior segment left lower lobe measuring 0.5 cm (series 3, image 79). No pleural effusion or pneumothorax. Upper Abdomen: No acute abnormality. Nonobstructive right nephrolithiasis. Musculoskeletal: No chest wall abnormality.  No acute osseous findings. IMPRESSION: 1. Extensive bilateral heterogeneous ground-glass and consolidative airspace opacity throughout the lungs is slightly worsened compared to prior examination. Findings are consistent with worsened multifocal infection. 2. No significant change in a serpiginous, thick-walled cavitary lesion about the peripheral left lung, the largest axial component at the posterolateral left upper lobe measuring 4.4 x 2.5 cm. 3. Endotracheal intubation. 4. Severe emphysema and diffuse bilateral bronchial wall thickening. 5. Frothy debris in the lower trachea and left mainstem bronchus. 6. Coronary artery disease. 7. Nonobstructive right nephrolithiasis. Aortic Atherosclerosis (ICD10-I70.0) and Emphysema (ICD10-J43.9). Electronically Signed   By: Delanna Ahmadi M.D.   On: 02/28/2022 17:56   DG Chest Portable 1 View  Result Date: 03/13/2022 CLINICAL DATA:  Status post intubation. EXAM: PORTABLE CHEST 1 VIEW COMPARISON:  February 28, 2022. FINDINGS: The heart size and mediastinal contours are within normal limits. Nasogastric tube is seen in right mainstem bronchus. Endotracheal tube is in good position. Bilateral lung opacities are again noted concerning for multifocal pneumonia. The visualized skeletal structures are unremarkable. IMPRESSION: Endotracheal tube is in grossly good position. Nasogastric tube is seen in right mainstem bronchus; withdrawal is recommended. Critical Value/emergent results were called by telephone at the time of interpretation  on 02/27/2022 at 3:38 pm to provider Dr. Eula Listen, who verbally acknowledged these results. Electronically Signed   By: Marijo Conception M.D.   On: 02/27/2022 15:39   ECHOCARDIOGRAM COMPLETE  Result Date: 03/03/2022    ECHOCARDIOGRAM REPORT   Patient Name:   Stanley Mclean Date of Exam: 03/03/2022 Medical Rec #:  [id number]       Height:       69.0 in Accession #:    [id number]      Weight:       116.4 lb Date of Birth:  [birth year]       BSA:          1.641 m Patient Age:    39 years        BP:           138/52 mmHg Patient Gender: M               HR:           100 bpm. Exam Location:  [facility] Procedure: 2D Echo, Color Doppler and Cardiac Doppler Indications:     I63.9 Stroke  History:         Patient has prior history of Echocardiogram examinations, most                  recent 04/03/2021. CAD, COPD, Signs/Symptoms:Altered mental                  status; Risk Factors:HCL and Current Smoker.  Sonographer:     Charmayne Sheer Referring Phys:  [id number] Lorenza Chick Diagnosing Phys: Kathlyn Sacramento MD  Sonographer Comments: Suboptimal apical window and suboptimal subcostal window. Image acquisition challenging due to uncooperative patient and Image acquisition challenging due to COPD. IMPRESSIONS  1. Left ventricular ejection fraction, by estimation, is 55 to 60%. The left ventricle has normal function. The left ventricle has no regional wall motion abnormalities. Left ventricular diastolic parameters are consistent with Grade II diastolic dysfunction (pseudonormalization).  2. Right ventricular systolic function is normal. The right ventricular size is normal.  3. The mitral valve is normal in structure. Trivial mitral valve regurgitation. No evidence of mitral stenosis. Moderate mitral annular calcification.  4. The aortic valve is calcified. Aortic valve regurgitation  is not visualized. Mild aortic valve stenosis. Aortic valve mean gradient measures 8.0 mmHg. Aortic valve Vmax measures 1.83 m/s.  5. The inferior vena  cava is normal in size with greater than 50% respiratory variability, suggesting right atrial pressure of 3 mmHg. FINDINGS  Left Ventricle: Left ventricular ejection fraction, by estimation, is 55 to 60%. The left ventricle has normal function. The left ventricle has no regional wall motion abnormalities. The left ventricular internal cavity size was normal in size. There is  borderline left ventricular hypertrophy. Left ventricular diastolic parameters are consistent with Grade II diastolic dysfunction (pseudonormalization). Right Ventricle: The right ventricular size is normal. No increase in right ventricular wall thickness. Right ventricular systolic function is normal. Left Atrium: Left atrial size was normal in size. Right Atrium: Right atrial size was normal in size. Pericardium: There is no evidence of pericardial effusion. Mitral Valve: The mitral valve is normal in structure. Moderate mitral annular calcification. Trivial mitral valve regurgitation. No evidence of mitral valve stenosis. Tricuspid Valve: The tricuspid valve is normal in structure. Tricuspid valve regurgitation is trivial. No evidence of tricuspid stenosis. Aortic Valve: The aortic valve is calcified. Aortic valve regurgitation is not visualized. Mild aortic stenosis is present. Aortic valve mean gradient measures 8.0 mmHg. Aortic valve peak gradient measures 13.4 mmHg. Aortic valve area, by VTI measures 1.72 cm. Pulmonic Valve: The pulmonic valve was normal in structure. Pulmonic valve regurgitation is not visualized. No evidence of pulmonic stenosis. Aorta: The aortic root is normal in size and structure. Venous: The inferior vena cava is normal in size with greater than 50% respiratory variability, suggesting right atrial pressure of 3 mmHg. IAS/Shunts: No atrial level shunt detected by color flow Doppler.  LEFT VENTRICLE PLAX 2D LVIDd:         3.90 cm   Diastology LVIDs:         2.40 cm   LV e' medial:    5.55 cm/s LV PW:         1.10  cm   LV E/e' medial:  16.7 LV IVS:        0.80 cm   LV e' lateral:   5.33 cm/s LVOT diam:     1.90 cm   LV E/e' lateral: 17.4 LV SV:         54 LV SV Index:   33 LVOT Area:     2.84 cm  LEFT ATRIUM           Index LA diam:      2.50 cm 1.52 cm/m LA Vol (A4C): 18.1 ml 11.03 ml/m  AORTIC VALVE                     PULMONIC VALVE AV Area (Vmax):    1.58 cm      PV Vmax:       1.58 m/s AV Area (Vmean):   1.60 cm      PV Vmean:      99.800 cm/s AV Area (VTI):     1.72 cm      PV VTI:        0.256 m AV Vmax:           183.00 cm/s   PV Peak grad:  10.0 mmHg AV Vmean:          133.000 cm/s  PV Mean grad:  5.0 mmHg AV VTI:            0.315 m AV Peak Grad:  13.4 mmHg AV Mean Grad:      8.0 mmHg LVOT Vmax:         102.00 cm/s LVOT Vmean:        75.000 cm/s LVOT VTI:          0.191 m LVOT/AV VTI ratio: 0.61  AORTA Ao Root diam: 3.30 cm MITRAL VALVE MV Area (PHT): 4.45 cm     SHUNTS MV Decel Time: 171 msec     Systemic VTI:  0.19 m MV E velocity: 92.55 cm/s   Systemic Diam: 1.90 cm MV A velocity: 117.00 cm/s MV E/A ratio:  0.79 Kathlyn Sacramento MD Electronically signed by Kathlyn Sacramento MD Signature Date/Time: 03/03/2022/1:47:36 PM    Final    CT ANGIO HEAD NECK W WO CM  Result Date: 03/02/2022 CLINICAL DATA:  Stroke/TIA EXAM: CT ANGIOGRAPHY HEAD AND NECK TECHNIQUE: Multidetector CT imaging of the head and neck was performed using the standard protocol during bolus administration of intravenous contrast. Multiplanar CT image reconstructions and MIPs were obtained to evaluate the vascular anatomy. Carotid stenosis measurements (when applicable) are obtained utilizing NASCET criteria, using the distal internal carotid diameter as the denominator. RADIATION DOSE REDUCTION: This exam was performed according to the departmental dose-optimization program which includes automated exposure control, adjustment of the mA and/or kV according to patient size and/or use of iterative reconstruction technique. CONTRAST:  125m  OMNIPAQUE IOHEXOL 350 MG/ML SOLN COMPARISON:  02/28/2022 FINDINGS: CT HEAD FINDINGS Brain: There is no mass, hemorrhage or extra-axial collection. There is generalized atrophy without lobar predilection. There is hypoattenuation of the periventricular white matter, most commonly indicating chronic ischemic microangiopathy. Skull: The visualized skull base, calvarium and extracranial soft tissues are normal. Sinuses/Orbits: No fluid levels or advanced mucosal thickening of the visualized paranasal sinuses. No mastoid or middle ear effusion. The orbits are normal. CTA NECK FINDINGS SKELETON: There is no bony spinal canal stenosis. No lytic or blastic lesion. OTHER NECK: Normal pharynx, larynx and major salivary glands. No cervical lymphadenopathy. Unremarkable thyroid gland. UPPER CHEST: Emphysema. Multifocal cavitation in the left upper lobe. AORTIC ARCH: There is calcific atherosclerosis of the aortic arch. There is no aneurysm, dissection or hemodynamically significant stenosis of the visualized portion of the aorta. Conventional 3 vessel aortic branching pattern. The visualized proximal subclavian arteries are widely patent. RIGHT CAROTID SYSTEM: No dissection, occlusion or aneurysm. There is predominantly calcified atherosclerosis extending into the proximal ICA, resulting in 60% stenosis. Mild calcific atherosclerosis in the common carotid artery without stenosis. LEFT CAROTID SYSTEM: No dissection, occlusion or aneurysm. Mild atherosclerotic calcification at the carotid bifurcation without hemodynamically significant stenosis. Mild atherosclerosis within the left common carotid artery. VERTEBRAL ARTERIES: Right dominant configuration. Both origins are clearly patent. There is no dissection, occlusion or flow-limiting stenosis to the skull base (V1-V3 segments). CTA HEAD FINDINGS POSTERIOR CIRCULATION: --Vertebral arteries: Normal V4 segments. --Inferior cerebellar arteries: Normal. --Basilar artery: Normal.  --Superior cerebellar arteries: Normal. --Posterior cerebral arteries (PCA): Normal. ANTERIOR CIRCULATION: --Intracranial internal carotid arteries: Atherosclerotic calcification of the internal carotid arteries at the skull base without hemodynamically significant stenosis. --Anterior cerebral arteries (ACA): Normal. Both A1 segments are present. Patent anterior communicating artery (a-comm). --Middle cerebral arteries (MCA): Normal. VENOUS SINUSES: As permitted by contrast timing, patent. ANATOMIC VARIANTS: None Review of the MIP images confirms the above findings. IMPRESSION: 1. No emergent large vessel occlusion or high-grade stenosis of the intracranial arteries. 2. Approximately 60% stenosis of the proximal right ICA secondary to predominantly calcified atherosclerosis. 3. Abnormalities within the chest  are better characterized on the chest CT. Aortic atherosclerosis (ICD10-I70.0). Electronically Signed   By: Ulyses Jarred M.D.   On: 03/02/2022 01:57   CT CHEST ABDOMEN PELVIS W CONTRAST  Result Date: 03/02/2022 CLINICAL DATA:  Sepsis. EXAM: CT CHEST, ABDOMEN, AND PELVIS WITH CONTRAST TECHNIQUE: Multidetector CT imaging of the chest, abdomen and pelvis was performed following the standard protocol during bolus administration of intravenous contrast. RADIATION DOSE REDUCTION: This exam was performed according to the departmental dose-optimization program which includes automated exposure control, adjustment of the mA and/or kV according to patient size and/or use of iterative reconstruction technique. CONTRAST:  171m OMNIPAQUE IOHEXOL 350 MG/ML SOLN COMPARISON:  CT chest 04/03/2021 FINDINGS: CT CHEST FINDINGS Cardiovascular: No significant vascular findings. Normal heart size. No pericardial effusion. There are atherosclerotic calcifications of the aorta and coronary arteries. Mediastinum/Nodes: No enlarged mediastinal, hilar, or axillary lymph nodes. Thyroid gland, trachea, and esophagus demonstrate no  significant findings. Lungs/Pleura: Severe emphysematous changes are again seen. Are new patchy areas of airspace disease peripherally in the bilateral upper lobes, right middle lobe and right lower lobe. There is a new thick-walled cavitary lesion in the left upper lobe abutting the pleura measuring 4.9 x 3.2 x 5.4 cm. There is some surrounding patchy airspace disease. This appears contiguous with oblong airspace opacity in the left upper lobe abutting the pleura image 8/50 measuring 1.9 x 3.4 by 1.5 cm. There is a 3 mm nodule in the left lower lobe image 8/79 which is new from prior. There are trace bilateral pleural effusions. No pneumothorax. Musculoskeletal: No chest wall mass or suspicious bone lesions identified. CT ABDOMEN PELVIS FINDINGS Hepatobiliary: No focal liver abnormality is seen. No gallstones, gallbladder wall thickening, or biliary dilatation. Pancreas: Unremarkable. No pancreatic ductal dilatation or surrounding inflammatory changes. Spleen: Normal in size without focal abnormality. Adrenals/Urinary Tract: There are numerous bilateral renal cysts. The largest is in the left kidney measuring 4 cm. There is a mildly hyperdense likely cysts in the right kidney measuring 16 mm. There is no hydronephrosis or perinephric fat stranding. There is a 3 mm calculus in the right kidney. The adrenal glands are within normal limits. The bladder is decompressed by Foley catheter. Stomach/Bowel: There are postsurgical changes in the colon, likely hemicolectomy. There is a large amount of stool throughout the colon. There is no bowel obstruction, pneumatosis or free air. There is sigmoid colon diverticulosis. The stomach is decompressed. Vascular/Lymphatic: Aortic atherosclerosis. No enlarged abdominal or pelvic lymph nodes. Reproductive: Prostate is unremarkable. Other: No abdominal wall hernia or abnormality. No abdominopelvic ascites. Musculoskeletal: No acute or significant osseous findings. IMPRESSION: 1.  New thick-walled cavitary lesion in the left upper lobe measuring up to 5.4 cm. There is some surrounding patchy airspace disease. Findings are worrisome for cavitary neoplasm. Infection is also in the differential. 2. New patchy areas of airspace disease peripherally in the bilateral upper lobes, right middle lobe and right lower lobe worrisome for multifocal pneumonia. 3. Trace bilateral pleural effusions. 4. Severe emphysema. 5. Nonobstructing right renal calculus. 6. Colonic diverticulosis. 7. Bilateral renal cysts. One of the cysts in the right kidney is mildly hyperdense and may represent hemorrhagic or proteinaceous cyst. This can be further evaluated with ultrasound. 8. Large amount of stool throughout the colon. 9. There is a 3 mm nodule in the left lower lobe which is new from prior, indeterminate given other findings. Aortic Atherosclerosis (ICD10-I70.0) and Emphysema (ICD10-J43.9). Electronically Signed   By: ARonney AstersM.D.   On: 03/02/2022 01:54  MR BRAIN WO CONTRAST  Result Date: 02/28/2022 CLINICAL DATA:  Altered mental status EXAM: MRI HEAD WITHOUT CONTRAST TECHNIQUE: Multiplanar, multiecho pulse sequences of the brain and surrounding structures were obtained without intravenous contrast. COMPARISON:  None Available. FINDINGS: Brain: There are a few small foci of abnormal diffusion restriction in the left PCA territory. Fewer than 5 scattered microhemorrhages in a nonspecific pattern. There is multifocal hyperintense T2-weighted signal within the white matter. Generalized volume loss. 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 The midline structures are normal. Vascular: Major flow voids are preserved. Skull and upper cervical spine: Normal calvarium and skull base. Visualized upper cervical spine and soft tissues are normal. Sinuses/Orbits:No paranasal sinus fluid levels or advanced mucosal thickening. No mastoid or middle ear effusion. Normal orbits. IMPRESSION: 1. A few  small foci of acute ischemia in the left PCA territory. No hemorrhage or mass effect. 2. 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 Electronically Signed   By: Ulyses Jarred M.D.   On: 02/28/2022 20:03   CT HEAD WO CONTRAST (5MM)  Result Date: 02/28/2022 CLINICAL DATA:  Mental status change. EXAM: CT HEAD WITHOUT CONTRAST TECHNIQUE: Contiguous axial images were obtained from the base of the skull through the vertex without intravenous contrast. RADIATION DOSE REDUCTION: This exam was performed according to the departmental dose-optimization program which includes automated exposure control, adjustment of the mA and/or kV according to patient size and/or use of iterative reconstruction technique. COMPARISON:  Head CT 03/30/2016 is FINDINGS: Brain: No acute intracranial hemorrhage. No focal mass lesion. No CT evidence of acute infarction. No midline shift or mass effect. No hydrocephalus. Basilar cisterns are patent. There are periventricular and subcortical white matter hypodensities. Generalized cortical atrophy. Vascular: No hyperdense vessel or unexpected calcification. Skull: Normal. Negative for fracture or focal lesion. Sinuses/Orbits: Paranasal sinuses and mastoid air cells are clear. Orbits are clear. Other: None. IMPRESSION: No acute intracranial findings. Electronically Signed   By: Suzy Bouchard M.D.   On: 02/28/2022 11:32   DG Chest Portable 1 View  Result Date: 02/28/2022 CLINICAL DATA:  Altered mental status. EXAM: PORTABLE CHEST 1 VIEW COMPARISON:  Chest x-ray dated February 22, 2021. FINDINGS: The heart size and mediastinal contours are within normal limits. Normal pulmonary vascularity. New mild asymmetric nodular opacities in the right lower lobe, peripheral right mid lung, and peripheral left upper lobe. No pleural effusion or pneumothorax. No acute osseous abnormality. IMPRESSION: 1. New mild asymmetric nodular opacities in the both lungs, concerning for multifocal  pneumonia. Electronically Signed   By: Titus Dubin M.D.   On: 02/28/2022 11:28    Microbiology Recent Results (from the past 240 hour(s))  Culture, Respiratory w Gram Stain     Status: None   Collection Time: 03/13/22 12:34 PM   Specimen: Bronchial Wash; Respiratory  Result Value Ref Range Status   Specimen Description   Final    BRONCHIAL WASHINGS Performed at [facility], [address] [place], [place] [address]    Special Requests   Final    NONE Performed at [facility], [address] [place], [place] [address]    Gram Stain   Final    MODERATE WBC PRESENT, PREDOMINANTLY PMN MODERATE GRAM NEGATIVE RODS    Culture   Final    ABUNDANT PSEUDOMONAS AERUGINOSA NO STAPHYLOCOCCUS AUREUS ISOLATED Performed at [facility], [place] [address], [place], [place] [address]    Report Status 03/16/2022 FINAL  Final   Organism ID,  Bacteria PSEUDOMONAS AERUGINOSA  Final      Susceptibility   Pseudomonas aeruginosa - MIC*    CEFTAZIDIME >=64 RESISTANT Resistant     CIPROFLOXACIN <=0.25 SENSITIVE Sensitive     GENTAMICIN <=1 SENSITIVE Sensitive     IMIPENEM 2 SENSITIVE Sensitive     PIP/TAZO >=128 RESISTANT Resistant     * ABUNDANT PSEUDOMONAS AERUGINOSA  Acid Fast Smear (AFB)     Status: None   Collection Time: 03/13/22 12:34 PM   Specimen: Bronchial Wash; Respiratory  Result Value Ref Range Status   AFB Specimen Processing Concentration  Final   Acid Fast Smear Negative  Final    Comment: (NOTE) Performed At: [facility] [place], [place] [id number] Rush Farmer MD AT:[id number]    Source (AFB) SPUTUM  Final    Comment: Performed at [facility], [address] [place], [place] [address]  Culture, fungus without smear     Status: None (Preliminary result)   Collection Time: 03/13/22 12:34 PM   Specimen: Bronchial Wash; Lung  Result Value Ref Range Status   Specimen Description   Final    BRONCHIAL  WASHINGS Performed at [facility], [address], [place], [place] [address]    Special Requests   Final    NONE Performed at [facility], [address], [place], [place] [address]    Culture   Final    No Fungi Isolated in 4 Weeks Performed at [facility], [place] [address] [place], [place] [address]    Report Status PENDING  Incomplete    Lab Basic Metabolic Panel: Recent Labs  Lab 03/14/22 0648 03/15/22 0337 03/16/22 0225 03/17/22 0433 04-01-22 0425  NA 145 142 144 140 141  K 3.6 3.4* 4.0 4.1 4.1  CL 107 104 105 99 102  CO2 _0 GLUCOSE 184* 285* 147* 234* 143*  BUN 65* 73* 89* 92* 116*  CREATININE 2.34* 2.90* 3.64* 4.92* 5.50*  CALCIUM 8.1* 8.0* 8.0* 8.0* 8.3*  MG 2.0 2.0 2.2 2.3 2.4  PHOS 3.9 3.7  3.6 4.7*  4.6 5.6* 6.3*   Liver Function Tests: Recent Labs  Lab 03/15/22 0337 03/16/22 0225 03/17/22 0433 04-01-2022 0425  ALBUMIN 2.5* 2.4* 2.0* 2.0*   No results for input(s): "LIPASE", "AMYLASE" in the last 168 hours. No results for input(s): "AMMONIA" in the last 168 hours. CBC: Recent Labs  Lab 03/14/22 0648 03/15/22 0337 03/16/22 0225 03/17/22 0433 Apr 01, 2022 0425  WBC 8.9 10.8* 11.5* 9.8 7.2  NEUTROABS 8.2*  --   --   --   --   HGB 7.6* 7.4* 7.5* 6.7* 6.4*  HCT 25.9* 24.5* 25.5* 22.5* 20.8*  MCV 104.4* 104.3* 104.1* 102.7* 100.0  PLT 143* 111* 145* 150 139*   Cardiac Enzymes: No results for input(s): "CKTOTAL", "CKMB", "CKMBINDEX", "TROPONINI" in the last 168 hours. Sepsis Labs: Recent Labs  Lab 03/12/22 0432 03/13/22 0433 03/13/22 1559 03/14/22 0648 03/15/22 0337 03/16/22 0225 03/17/22 0433 04/01/2022 0425  PROCALCITON 0.40  --  3.79  --  19.64 20.65  --   --   WBC  --    < >  --    < > 10.8* 11.5* 9.8 7.2  LATICACIDVEN  --   --  1.5  --   --   --   --   --    < > = values in this interval not displayed.    Procedures/Operations  12/22: Endotracheal intubation in ED (  extubated 12/24) 12/28:  Endotracheal intubation (reintubation) 12/28: Bronchoscopy 12/28: Left IJ CVC placed     Darel Hong, AGACNP-BC [facility] Prefer epic messenger for cross cover needs If after hours, please call E-link  Bradly Bienenstock 2022/04/09, 2:47 PM

## 2022-04-17 NOTE — Consult Note (Signed)
PHARMACY CONSULT NOTE - ELECTROLYTES  Pharmacy Consult for Electrolyte Monitoring and Replacement   Recent Labs: Potassium (mmol/L)  Date Value  04-01-2022 4.1  06/07/2013 3.1 (L)   Magnesium (mg/dL)  Date Value  04/01/2022 2.4   Calcium (mg/dL)  Date Value  01-Apr-2022 8.3 (L)   Calcium, Total (mg/dL)  Date Value  06/07/2013 8.5   Albumin (g/dL)  Date Value  04/01/2022 2.0 (L)  12/29/2013 4.7  06/01/2013 4.0   Phosphorus (mg/dL)  Date Value  04/01/2022 6.3 (H)   Sodium  Date Value  April 01, 2022 141 mmol/L  07/07/2017 140  06/07/2013 141 mmol/L   Corrected Ca: 9.9 mg/dL  Assessment  Stanley Mclean is a 82 y.o. male presenting with pneumonia. PMH significant for CAD, HTN, COPD, and LUL cavitary lesion. Pharmacy has been consulted to monitor and replace electrolytes while in CCU.  Diet: TF at 50 mL/hr, FWF 100 mL Q4H MIVF: none Pertinent medications: amiodarone 200 mg per tube BID  Goal of Therapy: Electrolytes WNL. K >4, Mg > 2.   Plan:  Potassium: 4.1 >> 4.1, no replacement needed Magnesium: 2.3 >> 2.4, no replacement needed Phosphorus: 5.6 >> 6.3, no replacement needed Check renal function panel, Mg, Phos with AM labs  Thank you for allowing pharmacy to be a part of this patient's care.  Gretel Acre, PharmD PGY1 Pharmacy Resident 04-01-2022 12:11 PM

## 2022-04-17 DEATH — deceased

## 2022-04-28 LAB — ACID FAST CULTURE WITH REFLEXED SENSITIVITIES (MYCOBACTERIA): Acid Fast Culture: NEGATIVE

## 2022-09-08 IMAGING — CT CT CHEST W/O CM
2 of 4 series · 15 of 36 positions shown, 18 images · non-contrast
Comparison: Chest radiographs 02/22/2021 and 06/01/2013. Neck CT
[REDACTED]. No previous chest CT.

CLINICAL DATA: Follow up pulmonary nodule. Smoker. No given history
of malignancy.



[Series 2: thorax · axial · 0.62mm/px · z∈[-88,+220]mm · 12 of 183 slices shown, 15 images]
[im 15/183  mediastinal]
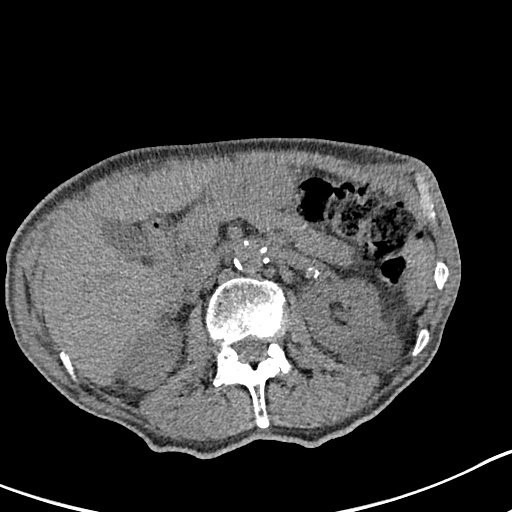
[im 15/183  lung]
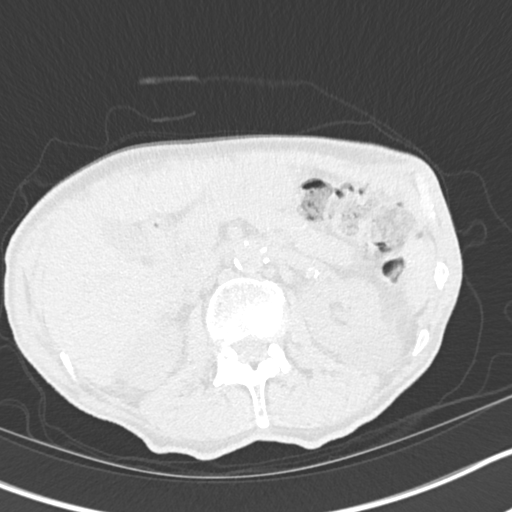
[im 29/183  lung]
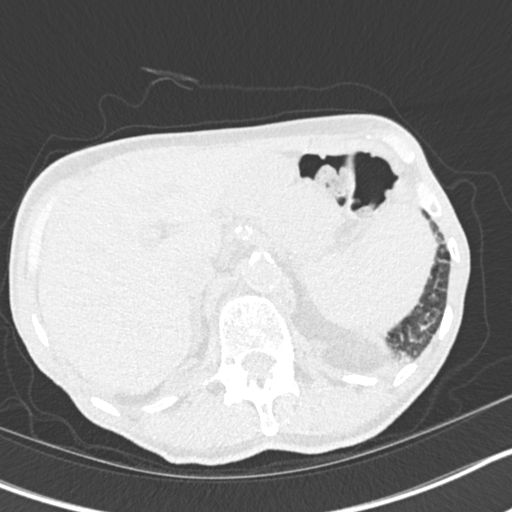
[im 43/183  lung]
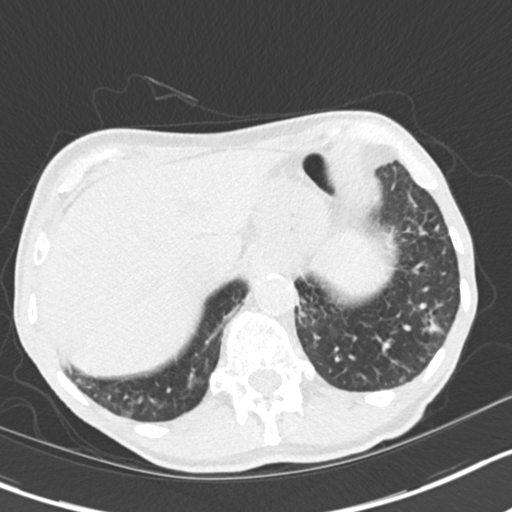
[im 57/183  lung]
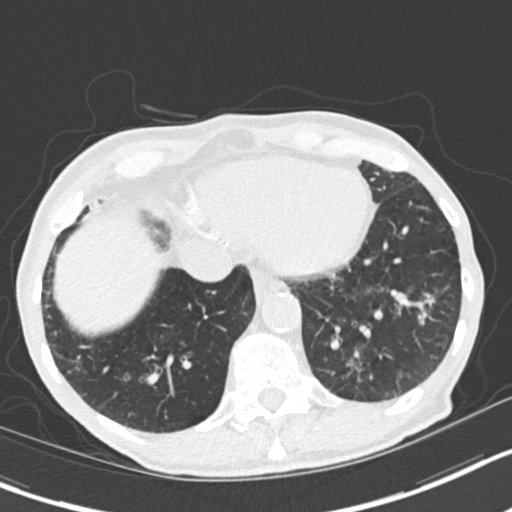
[im 71/183  mediastinal]
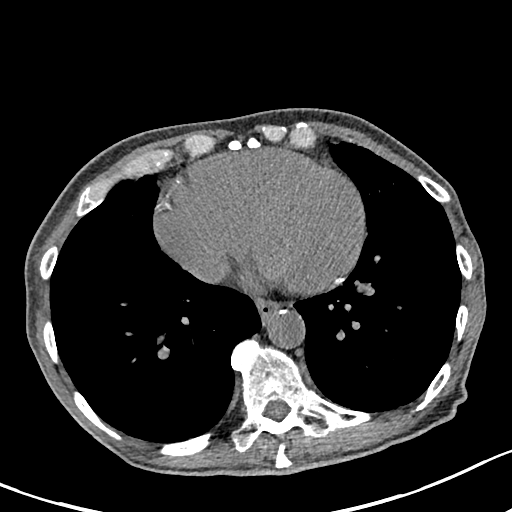
[im 71/183  lung]
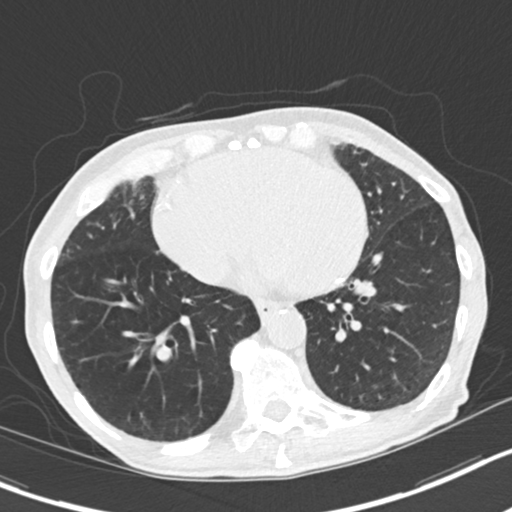
[im 85/183  lung]
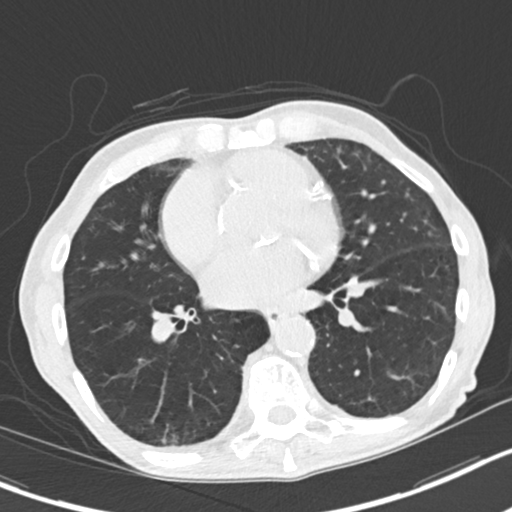
[im 99/183  lung]
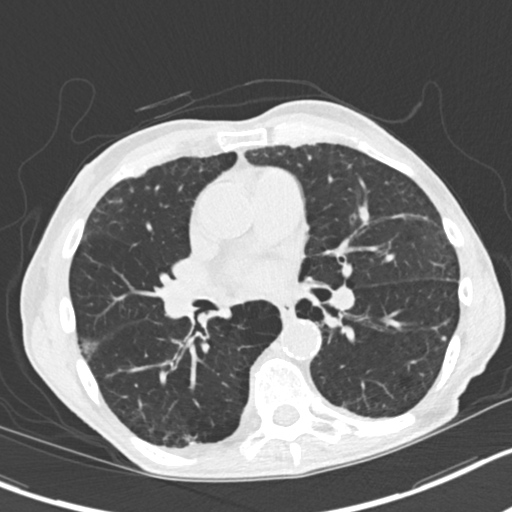
[im 113/183  lung]
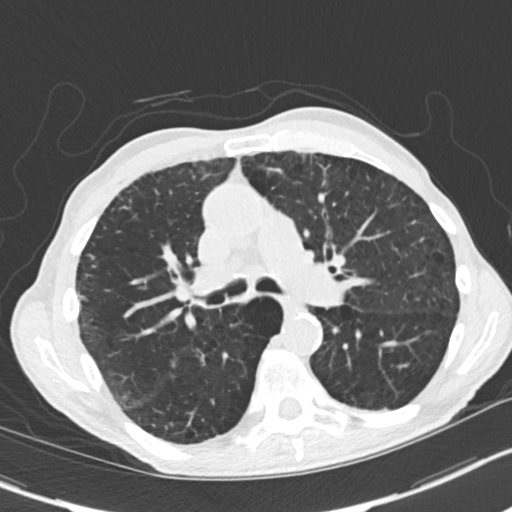
[im 127/183  mediastinal]
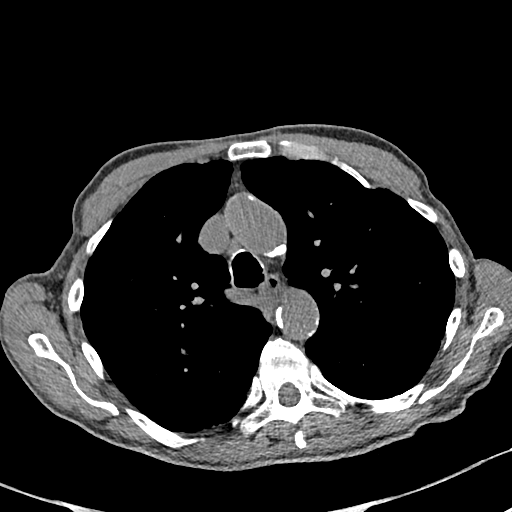
[im 127/183  lung]
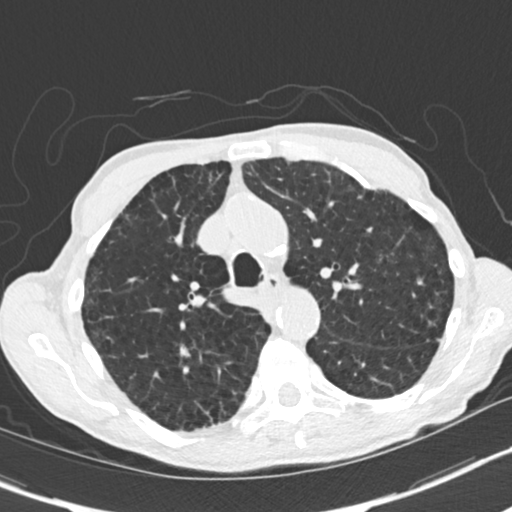
[im 141/183  lung]
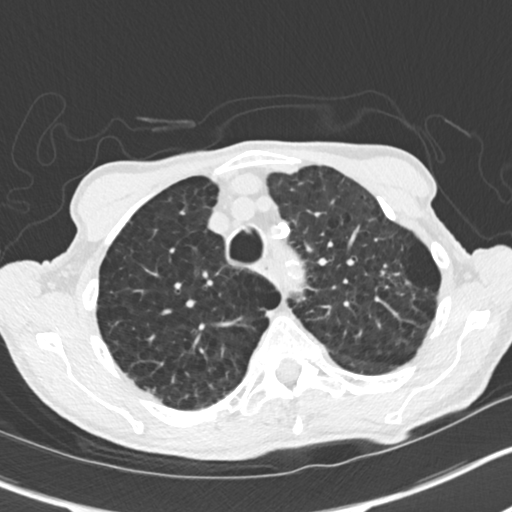
[im 155/183  lung]
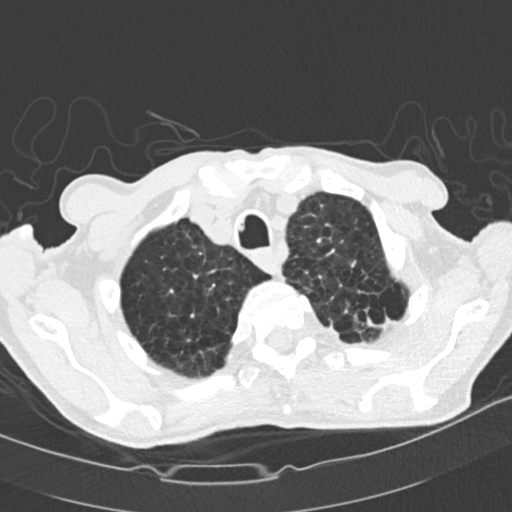
[im 169/183  lung]
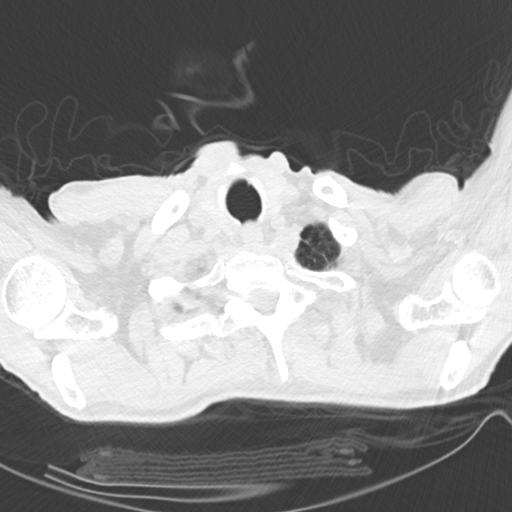

[Series 5: coronal · coronal · 0.72mm/px · 3 of 118 slices shown]
[im 24/118  lung]
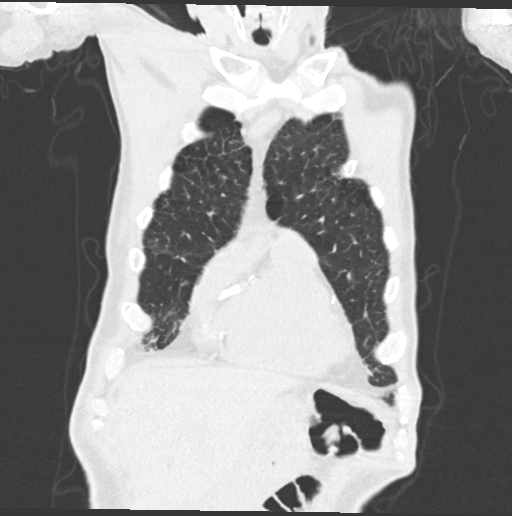
[im 47/118  lung]
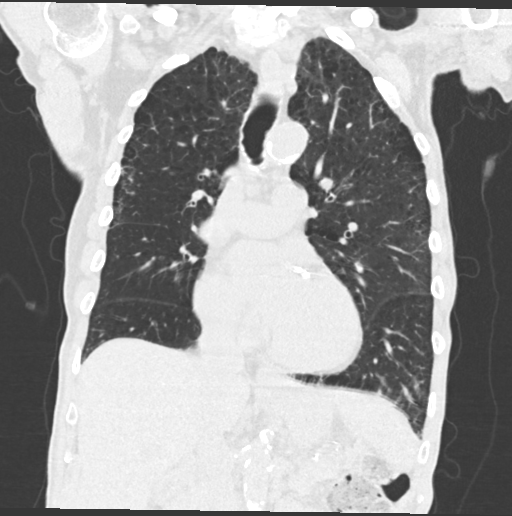
[im 71/118  lung]
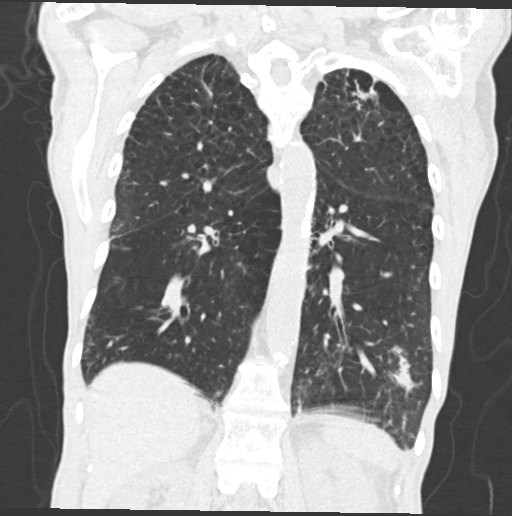

[15 of 36 positions shown; findings below may reference images not displayed]

FINDINGS: Cardiovascular: Diffuse atherosclerosis of the aorta, great vessels
and coronary arteries. There are probable calcifications of the
aortic valve. The heart size is normal. There is no pericardial
effusion.

Mediastinum/Nodes: There are no enlarged mediastinal, hilar or
axillary lymph nodes.Hilar assessment is limited by the lack of
intravenous contrast. The thyroid gland, trachea and esophagus
demonstrate no significant findings.

Lungs/Pleura: No pleural effusion or pneumothorax. There is moderate
centrilobular and paraseptal emphysema with diffuse central airway
thickening which has progressed from previous neck CTA. There is an
irregular nodular density in the left upper lobe corresponding with
the recent chest radiographic finding, measuring 2.5 x 1.3 cm on
image 36/3. Based on today's scout image, this appears enlarged over
the 6 week interval. There is an additional irregular nodular
density more superiorly at the left apex measuring 1.9 x 1.0 cm on
image 03/31/2016. There are other irregular nodules measuring 1.4 x 0.7 cm
in the left upper lobe on image 52/3 and 1.4 x 1.3 cm in the left
lower lobe on image 135/3. No suspicious right lung nodules
identified. There is a subpleural nodular density in the right upper
lobe measuring 1.3 x 0.6 cm on image 62/3 which is linear on the
reformatted images.

Upper abdomen: No suspicious findings are seen within the visualized
upper abdomen. There is no adrenal mass. There is a nonobstructing
calculus in the upper pole of the right kidney and probable
bilateral renal cysts. Diffuse aortic and branch vessel
atherosclerosis noted.

Musculoskeletal/Chest wall: There is no chest wall mass or
suspicious osseous finding. Anomalous articulation between the left
1st and 2nd ribs noted.
IMPRESSION: 1. There are several irregular left lung nodules, including one
corresponding with the questioned abnormality on recent radiographs.
This appears mildly enlarged over this 6 week interval, and the
apical components are new from prior neck CT of 3027. Multiplicity
favors an infectious/inflammatory process, although malignancy not
excluded. Consider repeat chest radiographs to assess for change
from [DATE]. Recommend CT follow-up at 2-3 months to assess
persistence and exclude malignancy.
2. No adenopathy or pleural effusion identified.
3. Coronary and aortic Atherosclerosis (M578R-O86.6) and Emphysema
(M578R-CEL.1).
